# Patient Record
Sex: Female | Born: 1955 | Race: White | Hispanic: No | Marital: Married | Smoking: Former smoker
Health system: Southern US, Community
[De-identification: ages and names within clinical notes are randomized; demographics above are authoritative.]

## PROBLEM LIST (undated history)

## (undated) DIAGNOSIS — N840 Polyp of corpus uteri: Secondary | ICD-10-CM

## (undated) DIAGNOSIS — M1712 Unilateral primary osteoarthritis, left knee: Secondary | ICD-10-CM

## (undated) DIAGNOSIS — H409 Unspecified glaucoma: Secondary | ICD-10-CM

## (undated) DIAGNOSIS — T7840XA Allergy, unspecified, initial encounter: Secondary | ICD-10-CM

## (undated) DIAGNOSIS — E7800 Pure hypercholesterolemia, unspecified: Secondary | ICD-10-CM

## (undated) DIAGNOSIS — R011 Cardiac murmur, unspecified: Secondary | ICD-10-CM

## (undated) DIAGNOSIS — F411 Generalized anxiety disorder: Secondary | ICD-10-CM

## (undated) DIAGNOSIS — J189 Pneumonia, unspecified organism: Secondary | ICD-10-CM

## (undated) DIAGNOSIS — I10 Essential (primary) hypertension: Secondary | ICD-10-CM

## (undated) DIAGNOSIS — I499 Cardiac arrhythmia, unspecified: Secondary | ICD-10-CM

## (undated) DIAGNOSIS — F329 Major depressive disorder, single episode, unspecified: Secondary | ICD-10-CM

## (undated) DIAGNOSIS — E8940 Asymptomatic postprocedural ovarian failure: Secondary | ICD-10-CM

## (undated) DIAGNOSIS — N870 Mild cervical dysplasia: Secondary | ICD-10-CM

## (undated) DIAGNOSIS — M1990 Unspecified osteoarthritis, unspecified site: Secondary | ICD-10-CM

## (undated) DIAGNOSIS — E119 Type 2 diabetes mellitus without complications: Secondary | ICD-10-CM

## (undated) HISTORY — DX: Asymptomatic postprocedural ovarian failure: E89.40

## (undated) HISTORY — DX: Generalized anxiety disorder: F41.1

## (undated) HISTORY — DX: Unspecified glaucoma: H40.9

## (undated) HISTORY — DX: Allergy, unspecified, initial encounter: T78.40XA

## (undated) HISTORY — DX: Mild cervical dysplasia: N87.0

## (undated) HISTORY — DX: Essential (primary) hypertension: I10

## (undated) HISTORY — DX: Unspecified osteoarthritis, unspecified site: M19.90

## (undated) HISTORY — PX: DILATION AND CURETTAGE OF UTERUS: SHX78

## (undated) HISTORY — PX: TONSILLECTOMY: SUR1361

## (undated) HISTORY — DX: Pure hypercholesterolemia, unspecified: E78.00

## (undated) HISTORY — DX: Unilateral primary osteoarthritis, left knee: M17.12

## (undated) HISTORY — DX: Type 2 diabetes mellitus without complications: E11.9

## (undated) HISTORY — DX: Polyp of corpus uteri: N84.0

## (undated) HISTORY — DX: Major depressive disorder, single episode, unspecified: F32.9

---

## 1994-06-16 DIAGNOSIS — N870 Mild cervical dysplasia: Secondary | ICD-10-CM

## 1994-06-16 HISTORY — PX: CERVICAL CONE BIOPSY: SUR198

## 1994-06-16 HISTORY — DX: Mild cervical dysplasia: N87.0

## 1998-11-12 ENCOUNTER — Other Ambulatory Visit: Admission: RE | Admit: 1998-11-12 | Discharge: 1998-11-12 | Payer: Self-pay | Admitting: *Deleted

## 2000-05-19 ENCOUNTER — Other Ambulatory Visit: Admission: RE | Admit: 2000-05-19 | Discharge: 2000-05-19 | Payer: Self-pay | Admitting: *Deleted

## 2003-03-24 ENCOUNTER — Other Ambulatory Visit: Admission: RE | Admit: 2003-03-24 | Discharge: 2003-03-24 | Payer: Self-pay | Admitting: Obstetrics and Gynecology

## 2003-06-17 DIAGNOSIS — J189 Pneumonia, unspecified organism: Secondary | ICD-10-CM

## 2003-06-17 HISTORY — DX: Pneumonia, unspecified organism: J18.9

## 2003-07-10 ENCOUNTER — Inpatient Hospital Stay (HOSPITAL_COMMUNITY): Admission: EM | Admit: 2003-07-10 | Discharge: 2003-07-13 | Payer: Self-pay | Admitting: Emergency Medicine

## 2003-08-22 ENCOUNTER — Encounter: Admission: RE | Admit: 2003-08-22 | Discharge: 2003-08-22 | Payer: Self-pay | Admitting: Internal Medicine

## 2003-09-05 ENCOUNTER — Ambulatory Visit: Admission: RE | Admit: 2003-09-05 | Discharge: 2003-09-05 | Payer: Self-pay | Admitting: Internal Medicine

## 2004-07-24 ENCOUNTER — Other Ambulatory Visit: Admission: RE | Admit: 2004-07-24 | Discharge: 2004-07-24 | Payer: Self-pay | Admitting: Obstetrics and Gynecology

## 2005-02-06 ENCOUNTER — Ambulatory Visit: Payer: Self-pay | Admitting: Endocrinology

## 2005-04-10 ENCOUNTER — Ambulatory Visit: Payer: Self-pay | Admitting: Endocrinology

## 2005-04-15 ENCOUNTER — Ambulatory Visit: Payer: Self-pay | Admitting: Endocrinology

## 2005-08-22 ENCOUNTER — Other Ambulatory Visit: Admission: RE | Admit: 2005-08-22 | Discharge: 2005-08-22 | Payer: Self-pay | Admitting: Obstetrics and Gynecology

## 2005-12-29 ENCOUNTER — Encounter: Admission: RE | Admit: 2005-12-29 | Discharge: 2005-12-29 | Payer: Self-pay | Admitting: Orthopedic Surgery

## 2006-04-17 ENCOUNTER — Ambulatory Visit: Payer: Self-pay | Admitting: Endocrinology

## 2006-06-02 ENCOUNTER — Ambulatory Visit: Payer: Self-pay | Admitting: Internal Medicine

## 2006-06-16 HISTORY — PX: TOTAL HIP ARTHROPLASTY: SHX124

## 2006-07-22 ENCOUNTER — Ambulatory Visit: Payer: Self-pay | Admitting: Endocrinology

## 2006-07-22 LAB — CONVERTED CEMR LAB
AST: 17 units/L (ref 0–37)
Albumin: 3.8 g/dL (ref 3.5–5.2)
Basophils Absolute: 0.1 10*3/uL (ref 0.0–0.1)
Bilirubin Urine: NEGATIVE
Bilirubin, Direct: 0.1 mg/dL (ref 0.0–0.3)
Calcium: 9.1 mg/dL (ref 8.4–10.5)
Chloride: 105 meq/L (ref 96–112)
Cholesterol: 252 mg/dL (ref 0–200)
Eosinophils Absolute: 0.4 10*3/uL (ref 0.0–0.6)
GFR calc Af Amer: 137 mL/min
GFR calc non Af Amer: 113 mL/min
Glucose, Bld: 89 mg/dL (ref 70–99)
HDL: 34.3 mg/dL — ABNORMAL LOW (ref >39.0)
Hemoglobin, Urine: NEGATIVE
Hgb A1c MFr Bld: 5.7 % (ref 4.6–6.0)
Ketones, ur: NEGATIVE mg/dL
Leukocytes, UA: NEGATIVE
MCHC: 34.3 g/dL (ref 30.0–36.0)
MCV: 89.1 fL (ref 78.0–100.0)
Monocytes Relative: 6.2 % (ref 3.0–11.0)
Platelets: 239 10*3/uL (ref 150–400)
RBC: 4.86 M/uL (ref 3.87–5.11)
RDW: 13.3 % (ref 11.5–14.6)
Sodium: 141 meq/L (ref 135–145)
Specific Gravity, Urine: >=1.03 (ref 1.000–1.03)
TSH: 2.34 microintl units/mL (ref 0.35–5.50)
Total CHOL/HDL Ratio: 7.3
Triglycerides: 186 mg/dL — ABNORMAL HIGH (ref 0–149)
pH: 6 (ref 5.0–8.0)

## 2006-08-12 ENCOUNTER — Ambulatory Visit: Payer: Self-pay

## 2006-08-17 ENCOUNTER — Ambulatory Visit: Payer: Self-pay | Admitting: Endocrinology

## 2006-09-08 ENCOUNTER — Inpatient Hospital Stay (HOSPITAL_COMMUNITY): Admission: RE | Admit: 2006-09-08 | Discharge: 2006-09-12 | Payer: Self-pay | Admitting: Orthopedic Surgery

## 2006-09-08 ENCOUNTER — Ambulatory Visit: Payer: Self-pay | Admitting: Internal Medicine

## 2006-10-07 ENCOUNTER — Ambulatory Visit (HOSPITAL_COMMUNITY): Admission: RE | Admit: 2006-10-07 | Discharge: 2006-10-07 | Payer: Self-pay | Admitting: Ophthalmology

## 2006-10-26 ENCOUNTER — Ambulatory Visit: Payer: Self-pay | Admitting: Endocrinology

## 2006-11-17 ENCOUNTER — Ambulatory Visit: Payer: Self-pay | Admitting: Endocrinology

## 2006-11-17 LAB — CONVERTED CEMR LAB
ALT: 18 units/L (ref 0–40)
AST: 19 units/L (ref 0–37)
Alkaline Phosphatase: 59 units/L (ref 39–117)
Bilirubin, Direct: 0.1 mg/dL (ref 0.0–0.3)
Cholesterol: 170 mg/dL (ref 0–200)
HDL: 37.6 mg/dL — ABNORMAL LOW (ref >39.0)
Total Bilirubin: 0.7 mg/dL (ref 0.3–1.2)
Total Protein: 6.5 g/dL (ref 6.0–8.3)

## 2007-03-31 ENCOUNTER — Ambulatory Visit: Payer: Self-pay | Admitting: Endocrinology

## 2007-03-31 ENCOUNTER — Encounter: Payer: Self-pay | Admitting: Endocrinology

## 2007-08-11 ENCOUNTER — Ambulatory Visit: Payer: Self-pay | Admitting: Endocrinology

## 2007-08-23 ENCOUNTER — Telehealth (INDEPENDENT_AMBULATORY_CARE_PROVIDER_SITE_OTHER): Payer: Self-pay | Admitting: *Deleted

## 2007-12-20 ENCOUNTER — Telehealth (INDEPENDENT_AMBULATORY_CARE_PROVIDER_SITE_OTHER): Payer: Self-pay | Admitting: *Deleted

## 2008-04-03 ENCOUNTER — Ambulatory Visit: Payer: Self-pay | Admitting: Endocrinology

## 2008-04-03 DIAGNOSIS — M791 Myalgia, unspecified site: Secondary | ICD-10-CM | POA: Insufficient documentation

## 2008-04-03 DIAGNOSIS — E1169 Type 2 diabetes mellitus with other specified complication: Secondary | ICD-10-CM | POA: Insufficient documentation

## 2008-04-03 DIAGNOSIS — I10 Essential (primary) hypertension: Secondary | ICD-10-CM | POA: Insufficient documentation

## 2008-04-03 DIAGNOSIS — E7800 Pure hypercholesterolemia, unspecified: Secondary | ICD-10-CM | POA: Insufficient documentation

## 2008-04-03 DIAGNOSIS — I152 Hypertension secondary to endocrine disorders: Secondary | ICD-10-CM | POA: Insufficient documentation

## 2008-04-03 HISTORY — DX: Essential (primary) hypertension: I10

## 2008-04-03 HISTORY — DX: Pure hypercholesterolemia, unspecified: E78.00

## 2008-05-09 ENCOUNTER — Other Ambulatory Visit: Admission: RE | Admit: 2008-05-09 | Discharge: 2008-05-09 | Payer: Self-pay | Admitting: Obstetrics and Gynecology

## 2008-05-09 ENCOUNTER — Ambulatory Visit: Payer: Self-pay | Admitting: Obstetrics and Gynecology

## 2008-05-09 ENCOUNTER — Encounter: Payer: Self-pay | Admitting: Obstetrics and Gynecology

## 2008-10-19 ENCOUNTER — Ambulatory Visit: Payer: Self-pay | Admitting: Endocrinology

## 2008-10-19 DIAGNOSIS — F320 Major depressive disorder, single episode, mild: Secondary | ICD-10-CM | POA: Insufficient documentation

## 2008-10-19 DIAGNOSIS — F329 Major depressive disorder, single episode, unspecified: Secondary | ICD-10-CM

## 2008-10-19 HISTORY — DX: Major depressive disorder, single episode, unspecified: F32.9

## 2008-12-01 ENCOUNTER — Ambulatory Visit: Payer: Self-pay | Admitting: Internal Medicine

## 2008-12-01 ENCOUNTER — Ambulatory Visit: Payer: Self-pay | Admitting: Gastroenterology

## 2008-12-14 HISTORY — PX: COLONOSCOPY W/ POLYPECTOMY: SHX1380

## 2008-12-15 ENCOUNTER — Ambulatory Visit: Payer: Self-pay | Admitting: Gastroenterology

## 2008-12-15 ENCOUNTER — Encounter: Payer: Self-pay | Admitting: Gastroenterology

## 2008-12-19 ENCOUNTER — Encounter: Payer: Self-pay | Admitting: Gastroenterology

## 2009-03-28 ENCOUNTER — Ambulatory Visit: Admission: RE | Admit: 2009-03-28 | Discharge: 2009-03-28 | Payer: Self-pay | Admitting: Internal Medicine

## 2009-03-28 ENCOUNTER — Encounter: Payer: Self-pay | Admitting: Internal Medicine

## 2009-03-28 ENCOUNTER — Ambulatory Visit: Payer: Self-pay | Admitting: Vascular Surgery

## 2009-04-02 ENCOUNTER — Encounter: Payer: Self-pay | Admitting: Endocrinology

## 2009-04-03 ENCOUNTER — Ambulatory Visit: Payer: Self-pay | Admitting: Endocrinology

## 2009-04-16 ENCOUNTER — Encounter (HOSPITAL_BASED_OUTPATIENT_CLINIC_OR_DEPARTMENT_OTHER): Admission: RE | Admit: 2009-04-16 | Discharge: 2009-05-28 | Payer: Self-pay | Admitting: Internal Medicine

## 2009-07-17 LAB — HM MAMMOGRAPHY: HM Mammogram: NORMAL

## 2009-08-17 ENCOUNTER — Telehealth: Payer: Self-pay | Admitting: Endocrinology

## 2009-08-18 ENCOUNTER — Ambulatory Visit: Payer: Self-pay | Admitting: Family Medicine

## 2009-08-18 LAB — CONVERTED CEMR LAB
Blood in Urine, dipstick: NEGATIVE
Ketones, urine, test strip: NEGATIVE
Nitrite: NEGATIVE
Protein, U semiquant: NEGATIVE

## 2009-11-01 ENCOUNTER — Ambulatory Visit: Payer: Self-pay | Admitting: Obstetrics and Gynecology

## 2009-11-01 ENCOUNTER — Other Ambulatory Visit: Admission: RE | Admit: 2009-11-01 | Discharge: 2009-11-01 | Payer: Self-pay | Admitting: Obstetrics and Gynecology

## 2009-11-09 ENCOUNTER — Ambulatory Visit: Payer: Self-pay | Admitting: Obstetrics and Gynecology

## 2009-12-10 ENCOUNTER — Telehealth: Payer: Self-pay | Admitting: Endocrinology

## 2009-12-25 ENCOUNTER — Ambulatory Visit: Payer: Self-pay | Admitting: Endocrinology

## 2009-12-26 LAB — CONVERTED CEMR LAB
ALT: 15 units/L (ref 0–35)
Albumin: 4.3 g/dL (ref 3.5–5.2)
BUN: 11 mg/dL (ref 6–23)
Basophils Absolute: 0.1 10*3/uL (ref 0.0–0.1)
CO2: 32 meq/L (ref 19–32)
Chloride: 104 meq/L (ref 96–112)
Cholesterol: 232 mg/dL — ABNORMAL HIGH (ref 0–200)
Glucose, Bld: 93 mg/dL (ref 70–99)
HCT: 45.4 % (ref 36.0–46.0)
HDL: 36.7 mg/dL — ABNORMAL LOW (ref >39.00)
Hemoglobin: 15.3 g/dL — ABNORMAL HIGH (ref 12.0–15.0)
Ketones, ur: NEGATIVE mg/dL
Lymphs Abs: 3 10*3/uL (ref 0.7–4.0)
MCV: 89.7 fL (ref 78.0–100.0)
Monocytes Absolute: 0.5 10*3/uL (ref 0.1–1.0)
Neutro Abs: 7 10*3/uL (ref 1.4–7.7)
Platelets: 247 10*3/uL (ref 150.0–400.0)
Potassium: 4.5 meq/L (ref 3.5–5.1)
RDW: 14.5 % (ref 11.5–14.6)
Specific Gravity, Urine: 1.025 (ref 1.000–1.030)
TSH: 2.07 microintl units/mL (ref 0.35–5.50)
Total Bilirubin: 0.8 mg/dL (ref 0.3–1.2)
Total Protein, Urine: 30 mg/dL
Urine Glucose: NEGATIVE mg/dL
Urobilinogen, UA: 0.2 (ref 0.0–1.0)
VLDL: 40.4 mg/dL — ABNORMAL HIGH (ref 0.0–40.0)

## 2009-12-28 ENCOUNTER — Ambulatory Visit: Payer: Self-pay | Admitting: Endocrinology

## 2009-12-28 DIAGNOSIS — G4730 Sleep apnea, unspecified: Secondary | ICD-10-CM | POA: Insufficient documentation

## 2010-01-24 ENCOUNTER — Ambulatory Visit: Payer: Self-pay | Admitting: Professional

## 2010-05-02 ENCOUNTER — Encounter: Payer: Self-pay | Admitting: Endocrinology

## 2010-07-14 LAB — CONVERTED CEMR LAB
ALT: 16 units/L (ref 0–35)
Albumin: 4.4 g/dL (ref 3.5–5.2)
Alkaline Phosphatase: 54 units/L (ref 39–117)
Alkaline Phosphatase: 55 units/L (ref 39–117)
BUN: 12 mg/dL (ref 6–23)
Basophils Relative: 0.4 % (ref 0.0–1.0)
Basophils Relative: 5.4 % — ABNORMAL HIGH (ref 0.0–3.0)
Bilirubin Urine: NEGATIVE
CO2: 32 meq/L (ref 19–32)
CO2: 32 meq/L (ref 19–32)
Calcium: 9.8 mg/dL (ref 8.4–10.5)
Chloride: 103 meq/L (ref 96–112)
Creatinine, Ser: 0.7 mg/dL (ref 0.4–1.2)
Crystals: NEGATIVE
Eosinophils Absolute: 0.3 10*3/uL (ref 0.0–0.7)
Eosinophils Relative: 2.9 % (ref 0.0–5.0)
HCT: 45.8 % (ref 36.0–46.0)
Hemoglobin, Urine: NEGATIVE
Hemoglobin, Urine: NEGATIVE
Hemoglobin: 15.2 g/dL — ABNORMAL HIGH (ref 12.0–15.0)
Hemoglobin: 15.2 g/dL — ABNORMAL HIGH (ref 12.0–15.0)
Ketones, ur: NEGATIVE mg/dL
Lymphocytes Relative: 18.3 % (ref 12.0–46.0)
Lymphocytes Relative: 27.7 % (ref 12.0–46.0)
MCHC: 33.7 g/dL (ref 30.0–36.0)
MCV: 88.3 fL (ref 78.0–100.0)
MCV: 89.6 fL (ref 78.0–100.0)
Mucus, UA: NEGATIVE
Neutro Abs: 6.1 10*3/uL (ref 1.4–7.7)
Neutro Abs: 7.7 10*3/uL (ref 1.4–7.7)
Neutrophils Relative %: 72.2 % (ref 43.0–77.0)
Platelets: 226 10*3/uL (ref 150–400)
Potassium: 3.7 meq/L (ref 3.5–5.1)
RBC: 5.11 M/uL (ref 3.87–5.11)
Sodium: 142 meq/L (ref 135–145)
TSH: 2.8 microintl units/mL (ref 0.35–5.50)
Total Bilirubin: 1.2 mg/dL (ref 0.3–1.2)
Total Protein, Urine: NEGATIVE mg/dL
Total Protein: 6.7 g/dL (ref 6.0–8.3)
Total Protein: 7.3 g/dL (ref 6.0–8.3)
Urine Glucose: NEGATIVE mg/dL
Urine Glucose: NEGATIVE mg/dL
Urobilinogen, UA: 0.2 (ref 0.0–1.0)
Urobilinogen, UA: 0.2 (ref 0.0–1.0)
VLDL: 28 mg/dL (ref 0–40)
WBC: 10.7 10*3/uL — ABNORMAL HIGH (ref 4.5–10.5)

## 2010-07-17 LAB — HM MAMMOGRAPHY: HM Mammogram: NORMAL

## 2010-07-18 NOTE — Progress Notes (Signed)
Summary: Orange urine  Phone Note Call from Patient Call back at [phone] cell   Caller: Patient Call For: Dr Everardo All Summary of Call: Pt states every afternoon for 1 wk her urine is "bright" orange. No fever,no burning or pain. No symptoms. Pt thinks it may be blood but she is not certain. Pt is requesting urinalysis. Please advise. Initial call taken by: Verdell Face,  August 17, 2009 3:12 PM  Follow-up for Phone Call        Would you like to see patient in office or do you prefer labs prior? Please advise. Follow-up by: Lucious Groves,  August 17, 2009 3:44 PM  Additional Follow-up for Phone Call Additional follow up Details #1::        ov now or tomorrow Additional Follow-up by: Minus Breeding MD,  August 17, 2009 3:47 PM    Additional Follow-up for Phone Call Additional follow up Details #2::    Patient notified. Follow-up by: Lucious Groves,  August 17, 2009 4:03 PM

## 2010-07-18 NOTE — Progress Notes (Signed)
Summary: CPX due  Phone Note Outgoing Call Call back at [facility] Phone [phone]   Call placed by: Brenton Grills MA,  December 10, 2009 1:53 PM Action Taken: Appt scheduled Details for Reason: CPX due Summary of Call: [facility] Summary from [employer] for pt-pt is due for an CPX, last OV was 08/2009 called pt left message on VM to return call.  Follow-up for Phone Call        Appointment scheduled 7/15 2pm

## 2010-07-18 NOTE — Assessment & Plan Note (Signed)
Summary: PER PH NOTE/AJ--PHYSICAL--D/T--STC   Vital Signs:  Patient profile:   55 year old female Height:      64 inches (162.56 cm) Weight:      265 pounds (120.45 kg) BMI:     45.65 O2 Sat:      94 % on Room air Temp:     97.6 degrees F (36.44 degrees C) oral Pulse rate:   79 / minute Pulse rhythm:   regular BP sitting:   140 / 86  (left arm) Cuff size:   large  Vitals Entered By: Brenton Grills MA (December 28, 2009 2:16 PM)  O2 Flow:  Room air CC: CPX/aj   Primary Provider:  Minus Breeding MD  CC:  CPX/aj.  History of Present Illness: here for regular wellness examination.  she's feeling pretty well in general, and does not smoke.  alcohol is rare.   Current Medications (verified): 1)  Ambien 10 Mg  Tabs (Zolpidem Tartrate) .... Qhs 2)  Aspirin Adult Low Strength 81 Mg Tbec (Aspirin) .Marland Kitchen.. 1 Daily 3)  Citalopram Hydrobromide 40 Mg Tabs (Citalopram Hydrobromide) .Marland Kitchen.. 1 Qd 4)  Hyzaar 100-25 Mg Tabs (Losartan Potassium-Hctz) .Marland Kitchen.. 1 Qd  Allergies (verified): No Known Drug Allergies  Family History: Reviewed history from 08/11/2007 and no changes required. patient's mother died from pancreatic cancer  Social History: Reviewed history from 12/01/2008 and no changes required. patient is married. 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         depression is well-controlled.   Physical Exam  General:  obese.  no distress  Head:  head: no deformity eyes: no periorbital swelling, no proptosis external nose and ears are normal mouth: no lesion seen Neck:  Supple without thyroid enlargement or tenderness.   Breasts:  sees gyn  Heart:   Regular rate and rhythm without murmurs or gallops noted. Normal S1,S2.   Abdomen:  abdomen is soft, nontender.  no hepatosplenomegaly.   not distended.  no hernia  Rectal:  sees gyn  Genitalia:  sees gyn  Msk:  muscle bulk and strength are grossly normal.  no obvious joint swelling.  gait is normal and steady  Extremities:  no deformity.  no ulcer on the feet.  feet are of normal color and temp.  no edema  Neurologic:  cn 2-12 grossly intact.   readily moves all 4's.   sensation is intact to touch on the feet. Skin:  normal texture and temp.  no rash.  not diaphoretic  Cervical Nodes:  No significant adenopathy.  Additional Exam:  SEPARATE EVALUATION FOLLOWS--EACH PROBLEM HERE IS NEW, NOT RESPONDING TO TREATMENT, OR POSES SIGNIFICANT RISK TO THE PATIENT'S HEALTH: HISTORY OF THE PRESENT ILLNESS: the status of at least 3 ongoing medical problems is addressed today: pt says she has difficulty with concentration.   ? sleep apnea:  pt says her husband says she snores.  she says ins does not pay for bariatric surgery.   htn:  she takes hyzaar, and tolerates well. dyslipidemia:  she tolerated crestor well, but is not taking. PAST MEDICAL HISTORY:   reviewed and up to date today. REVIEW OF SYSTEMS: denies sob.  she attributes cough to allergy. PHYSICAL EXAMINATION: dorsalis pedis intact bilat.  no  carotid bruit clear to auscultation.  no respiratory distress. does not appear anxious nor depressed. LAB/XRAY RESULTS: Cholesterol LDL  186.6 mg/dL. IMPRESSION: difficulty with concentration, uncertain etiology ? sleep apnea. dyslipidemia, needs increased rx htn, with ? of situational component. PLAN: see instruction sheet   Impression & Recommendations:  Problem # 1:  ROUTINE GENERAL MEDICAL EXAM@HEALTH  CARE FACL (ICD-V70.0)  Medications Added to Medication List This Visit: 1)  Crestor 20 Mg Tabs (Rosuvastatin calcium) .Marland Kitchen.. 1 once daily  Other Orders: Sleep Disorder Referral  (Sleep Disorder) Psychology Referral (Psychology) Est. Patient Level IV ([address]) Est. Patient 40-64 years ([address])  Patient Instructions: 1)  resume crestor 20 mg once daily 2)  in 4-6 weeks, go to lab for lipids 272.4, and liver v58.69, and also do repeat blood pressure check that day. 3)  refer sleep specialist.  you will be called with a day and time for an appointment 4)  refer psychologist.  you will be called with a day and time for an appointment. 5)  please consider these measures for your health:  minimize alcohol.  do not use tobacco products.  have a colonoscopy at least every 10 years from age 64.  keep firearms safely stored.  always use seat belts.  have working smoke alarms in your home.  see the dentist regularly.  never drive under the influence of alcohol or drugs (including prescription drugs).  those with fair skin should take precautions against the sun. 6)  it is critically important to maintain a healthy body weight.  i would be happy to refer you to a dietician.   7)  return 1 year. Prescriptions: AMBIEN 10 MG  TABS (ZOLPIDEM TARTRATE) qhs  #30 x 5   Entered and Authorized by:   Minus Breeding MD   Signed by:   Minus Breeding MD on 12/28/2009   Method used:   Print then Give to Patient   RxID:   [id number] HYZAAR 100-25 MG TABS (LOSARTAN POTASSIUM-HCTZ) 1 qd  #30 x 11   Entered and Authorized by:   Minus Breeding MD   Signed by:   Minus Breeding MD on 12/28/2009   Method used:   Electronically to        [facility] [address] [phone]* (retail)       [address] [place], [place]  [address]       Ph: [phone]       Fax: [phone]   RxID:   [id number] CITALOPRAM HYDROBROMIDE 40 MG TABS (CITALOPRAM HYDROBROMIDE) 1 qd  #30 x 11   Entered and Authorized by:   Minus Breeding MD   Signed by:   Minus Breeding MD on 12/28/2009   Method used:   Electronically to        [facility] [address] [phone]* (retail)       [address]  [place], [place]  [address]       Ph: [phone]       Fax: [phone]   RxID:   [id number] CRESTOR 20 MG TABS (ROSUVASTATIN CALCIUM) 1 once daily  #30 x 11   Entered and Authorized by:   Minus Breeding MD   Signed by:   Minus Breeding MD on 12/28/2009   Method used:   Electronically to        [facility]. [phone]* (retail)       [address]  [place], [place]  [address]       Ph: [phone]       Fax: [phone]   RxID:   [id number]    Preventive Care Screening  Pap Smear:    Date:  10/14/2009    Results:  normal   Mammogram:    Date:  07/17/2009    Results:  normal      gyn is dr Dianne Dun

## 2010-07-18 NOTE — Assessment & Plan Note (Signed)
Summary: ORANGE URINE/ PER TRIAGE/NWS   Vital Signs:  Patient profile:   55 year old female Weight:      265 pounds Temp:     98.8 degrees F oral Pulse rate:   80 / minute Pulse rhythm:   regular BP sitting:   144 / 100  (left arm) Cuff size:   large  Vitals Entered By: Lowella Petties CMA (August 18, 2009 9:12 AM) CC: Pt has noticed orange urine x 3. Also, pain right ear.   Primary Provider:  Minus Breeding MD   History of Present Illness: 55 year old:  Urine was an orange coloration, then happened again on Wed. Yesterday, was brownish orange.  Then looks normal after that instance. Not associated with intercourse. No dysuria. No urgency. Is feeling well other than recent URI.  Has had an earache for two weeks.   ROS: recent congestion, URI signs, no fever, chills, sweats  GEN: WDWN, NAD; alert,appropriate and cooperative throughout exam HEENT: Normocephalic and atraumatic. Throat clear, w/o exudate, no LAD, R TM serous fluid, L TM - good landmarks, some fluid present. mild rhinnorhea.  Left frontal and maxillary sinuses: NT Right frontal and maxillary sinuses: NT NECK: No ant or post LAD CV: RRR, No M/G/R PULM: no resp distress, no accessory muscles.  No retractions. no w/c/r ABD: S,NT,ND,+BS, No HSM EXTR: no c/c/e PSYCH: full affect, pleasant, conversant   Allergies: No Known Drug Allergies   Impression & Recommendations:  Problem # 1:  OTHER ABNORMALITY OF URINATION (ICD-788.69)  Etiology unclear, no blood on UA, no dysuria, no urgency.   May all be dehydration and encouraged fluid intake  Obtain BMP - to assure normal kidney function  Orders: UA Dipstick w/o Micro (manual) (29562)  Complete Medication List: 1)  Ambien 10 Mg Tabs (Zolpidem tartrate) .... Qhs 2)  Aspirin Adult Low Strength 81 Mg Tbec (Aspirin) .Marland Kitchen.. 1 daily 3)  Citalopram Hydrobromide 40 Mg Tabs (Citalopram hydrobromide) .Marland Kitchen.. 1 qd 4)  Hyzaar 100-25 Mg Tabs (Losartan potassium-hctz) .Marland Kitchen.. 1  qd  Prior Medications: AMBIEN 10 MG  TABS (ZOLPIDEM TARTRATE) qhs ASPIRIN ADULT LOW STRENGTH 81 MG TBEC (ASPIRIN) 1 daily CITALOPRAM HYDROBROMIDE 40 MG TABS (CITALOPRAM HYDROBROMIDE) 1 qd HYZAAR 100-25 MG TABS (LOSARTAN POTASSIUM-HCTZ) 1 qd Current Allergies: No known allergies   Laboratory Results   Urine Tests  Date/Time Received: August 18, 2009 9:22 AM  Date/Time Reported: August 18, 2009 9:22 AM   Routine Urinalysis   Color: yellow Appearance: Clear Glucose: negative   (Normal Range: Negative) Bilirubin: negative   (Normal Range: Negative) Ketone: negative   (Normal Range: Negative) Spec. Gravity: <1.005   (Normal Range: 1.003-1.035) Blood: negative   (Normal Range: Negative) pH: 5.0   (Normal Range: 5.0-8.0) Protein: negative   (Normal Range: Negative) Urobilinogen: 0.2   (Normal Range: 0-1) Nitrite: negative   (Normal Range: Negative) Leukocyte Esterace: negative   (Normal Range: Negative)       Appended Document: ORANGE URINE/ PER TRIAGE/NWS Could not find BMP results in EMR and could not access Echart from home. I called lab, and they have no record of blood draw from yesterday, so I assume patient did not go as advised.

## 2010-09-19 ENCOUNTER — Encounter: Payer: Self-pay | Admitting: Endocrinology

## 2010-09-19 ENCOUNTER — Other Ambulatory Visit (INDEPENDENT_AMBULATORY_CARE_PROVIDER_SITE_OTHER): Payer: 59

## 2010-09-19 ENCOUNTER — Ambulatory Visit (INDEPENDENT_AMBULATORY_CARE_PROVIDER_SITE_OTHER): Payer: 59 | Admitting: Endocrinology

## 2010-09-19 DIAGNOSIS — IMO0001 Reserved for inherently not codable concepts without codable children: Secondary | ICD-10-CM

## 2010-09-19 DIAGNOSIS — M791 Myalgia, unspecified site: Secondary | ICD-10-CM

## 2010-09-19 LAB — SEDIMENTATION RATE: Sed Rate: 20 mm/h (ref 0–22)

## 2010-09-19 MED ORDER — ACETAMINOPHEN-CODEINE #3 300-30 MG PO TABS: 1.0000 | ORAL_TABLET | ORAL | Status: AC | PRN

## 2010-09-19 NOTE — Patient Instructions (Addendum)
blood tests are being ordered for you today.  please call [phone] to hear your test results. Let's also check a special test of the nerve endings and muscles of the arms and legs.  you will be called with a day and time for an appointment. Here is a prescription for pain medication.

## 2010-09-19 NOTE — Progress Notes (Signed)
  Subjective:    Patient ID: Katherine Lawrence, female    DOB: 07-30-1955, 55 y.o.   MRN: [id number]  HPI Pt states few weeks of severe pain of the muscles of both arms and legs (worse on the arms), but no assoc numbness.  She says she last took crestor last year.   Past Medical History  Diagnosis Date  . DM 08/11/2007  . DIVERTICULITIS, ACUTE 12/01/2008  . HYPERCHOLESTEROLEMIA 04/03/2008  . DEPRESSION 10/19/2008  . HYPERTENSION 04/03/2008  . MYALGIA 04/03/2008   No past surgical history on file.  reports that she has quit smoking. She does not have any smokeless tobacco history on file. She reports that she does not drink alcohol or use illicit drugs. family history includes Cancer in her mother. No Known Allergies Review of Systems Denies numbness, arthralgias, and cramps.      Objective:   Physical Exam GENERAL: no distress.  Morbidly obese Msk: the muscles of the arms are slightly tender Full rom of the joint of the ue' and le's.  Painless passively, but painful with active rom. Neuro: sensation is intact to touch on all 4's Pulses: radials and dorsalis pedis are intact bilat.   skin:  No rash.    Assessment & Plan:  Myalgias, severe.  New problem Dyslipidemia, therapy limited by noncompliance.  i'll do the best i can.

## 2010-09-27 ENCOUNTER — Ambulatory Visit (INDEPENDENT_AMBULATORY_CARE_PROVIDER_SITE_OTHER): Payer: 59 | Admitting: Obstetrics and Gynecology

## 2010-09-27 DIAGNOSIS — B373 Candidiasis of vulva and vagina: Secondary | ICD-10-CM

## 2010-09-27 DIAGNOSIS — N949 Unspecified condition associated with female genital organs and menstrual cycle: Secondary | ICD-10-CM

## 2010-09-27 DIAGNOSIS — R5381 Other malaise: Secondary | ICD-10-CM

## 2010-09-27 DIAGNOSIS — R5383 Other fatigue: Secondary | ICD-10-CM

## 2010-10-02 ENCOUNTER — Telehealth: Payer: Self-pay

## 2010-10-02 NOTE — Telephone Encounter (Signed)
Pt informed

## 2010-10-02 NOTE — Telephone Encounter (Signed)
Pt called requesting results of labs done 09/19/2010

## 2010-10-02 NOTE — Telephone Encounter (Signed)
Normal--good 

## 2010-10-28 ENCOUNTER — Other Ambulatory Visit: Payer: Self-pay | Admitting: Endocrinology

## 2010-10-28 ENCOUNTER — Other Ambulatory Visit: Payer: Self-pay

## 2010-10-28 DIAGNOSIS — Z0000 Encounter for general adult medical examination without abnormal findings: Secondary | ICD-10-CM

## 2010-10-29 NOTE — Discharge Summary (Signed)
Katherine Lawrence, Katherine Lawrence                 ACCOUNT NO.:  [id number]   MEDICAL RECORD NO.:  [id number]          PATIENT TYPE:  INP   LOCATION:  5008                         FACILITY:  [facility]   PHYSICIAN:  Thereasa Distance A. Mortenson, M.D.DATE OF BIRTH:  08-24-1955   DATE OF ADMISSION:  09/08/2006  DATE OF DISCHARGE:  09/12/2006                               DISCHARGE SUMMARY   ADMISSION DIAGNOSES:  1. End-stage osteoarthritis right hip.  2. Obesity.  3. Hypercholesterolemia.   DISCHARGE DIAGNOSES:  1. 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  2. Acute blood loss anemia secondary to surgery.  3. Hypoxia now resolved.  4. Constipation.  5. Obesity.  6. Type 2 diabetes mellitus.  7. Hypokalemia now resolved.  8. Hypercholesterolemia.   SURGICAL PROCEDURES:  1. On September 08, 2006, Katherine Lawrence underwent a right total hip      arthroplasty by Dr. Rinaldo Ratel assisted by Dr. Claude Manges.      [place] and [employer].  She had a Pinnacle 100 series      acetabular cup placed, size 52 mm with an apex hole eliminator and      Pinnacle marathon acetabular liner lipped 32 mm inner diameter, 52      mm outer diameter.  An AML 6 inches small stature femoral stem size      10.5 mm with 32 mm femoral head with +1 neck length 12-14 cone.   COMPLICATIONS:  None.   CONSULTANT:  1. Physical therapy consult case management consult September 09, 2006.  2. Occupational therapy consult and [facility] consult      September 10, 2006.   HISTORY OF PRESENT ILLNESS:  This 55 year old white female patient  presented to Dr. Chaney Malling with a history of right hip pain and groin  pain for many years.  Pain is now interfering with her normal activities  and difficulty sleeping.  It is moderate to severe aches with occasional  sharpness.  She has failed conservative treatment and x-rays show end-  stage arthritic changes.  Because of that, she is presenting for right  hip replacement.   HOSPITAL COURSE:  Katherine Lawrence tolerated her surgical procedure well without  immediate postoperative complications.  She was transferred to 5000.  Postop day #1, T max was 99.6, vitals were stable.  Hemoglobin 11.8,  hematocrit 34.8.  Leg was neurovascularly intact.  She was weaned off  her oxygen and started on physical therapy.   Postop day #2, it was noted she was having difficulty keeping her  saturations.  On room air, she was 84-88%, placed back on oxygen and was  able to keep her saturations between 93-98%.  Hemoglobin was still  stable at 11.1, hematocrit 32.1, temperature was elevated at 100.6,  vitals were otherwise stable.  Leg was neurovascularly intact with  incision well approximated with staples.  Chest x-ray and ABG were  obtained to rule out pneumonia.  She was continued on therapy and  switched to p.o. pain medications.  X-ray, at that time, showed  pulmonary edema  and bilateral effusions consistent with fluid overload  and lower lobe atelectasis, possibly due to infection or atelectasis, so  internal medicine consult was obtained by Dr. Felicity Coyer, and she followed  her throughout the hospitalization.  They felt it was due to fluid  overload and adjusted her medications appropriately.   On postop day #3 she was feeling a bit better.  Hemoglobin 10.5,  hematocrit 31.2, potassium dropped to 3.2 at that time.  O2 was  attempted to be weaned at that time.  She was having some difficulty  with constipation and that was treated with medications.  Potassium was  supplemented.   On postop day #4, she was doing better.  The chest x-ray at that time  showed pulmonary edema had improved.  She was feeling better, T max  100.2, vitals were stable.  She was doing well with therapy and pain was  well-controlled with medications.  Because of that, she was able to be  DC home later that day.   MEDICATIONS:  She may resume her prehospitalization medications with   these substitutions.  She is not to be on Vicodin while she is on other  pain medications.  She is not to be on Aleve at this time, and she is  not to be on aspirin at this time.  Her other home medications at this  time include:  1. Crestor 40 mg p.o. q.a.m..  2. Citalopram 40 mg p.o. q.a.m.   Additional medications at this time include:  1. Aspirin 81 mg to resume 2 tablets each a.m. once the Arixtra is      completed.  2. Arixtra 2.5 mg subcu q.8 a.m. with the last dose to be September 18, 2006.  3. Percocet 5/325 mg 1-2 tablets p.o. q.4 h p.r.n. for pain 60 with no      refill.  4. Robaxin 500 mg 1-2 tablets p.o. q.6 h p.r.n. for spasms 40 with no      refill.  5. Avelox 400 mg 1 tablet p.o. q.a.m. for six more days.  6. Magnesium citrate, take half bottle p.o. when you get home and call      if no bowel movement.   DISCHARGE INSTRUCTIONS:  1. Diet:  She can resume her regular prehospitalization diet.  2. Activity: She can be out of bed, partial weightbearing 50% or less      on right leg with use of walker.  She is to have home health PT per      [facility].  Please see the blue total hip discharge      sheet for further activity instructions.  3. Wound care:  She is to keep the wound clean and dry and can change      the dressing daily.  She can shower after no drainage from the      wound for 2 days.  Please see the blue total hip discharge sheet      for further wound care instructions.  4. Follow-up:  She needs to follow up with Dr. Chaney Malling in our office      on Wednesday, September 23, 2006.  Needs to call [phone] for that      appointment.  She needs to follow up with Dr. Everardo All in his office      in about 2-3 weeks and needs to call him for that appointment.   LABORATORY DATA:  X-ray taken of the right hip on March 25  showed good  appearance of the hip without complicating features.  Chest x-ray done on March 27 showed mild to moderate congestive heart  failure a new  bilateral lower lobe atelectasis.  CT angio of the chest at that time,  March 27, showed no evidence for acute PE, but pulmonary edema and  bilateral effusions are likely due to congestive heart failure pulmonary  edema and there is air space disease within both lungs predominantly  affecting the lower lobes, likely due to infection and/or aspiration.  Repeat chest right x-ray done on March 28 showed small bilateral pleural  effusions and bibasilar atelectasis with pulmonary edema being better  than on previous CT.   ABG done on room air on March 27 showed a pH 7.442, pCO2 of 42.1 and pO2  of 50.8, bicarb of 28.3, base excess of 4.3.   Hemoglobin/hematocrit ranged from 16.2 and 48.2 on the 19th to 10.5 at  31.2 on the 28th.  White count went from 11.2 on the 19th to 10.2 on the  28th.  Platelets remained within normal limits.   Potassium dropped to a low of 3.2 on the 28th.  Glucose was 92 on the  19th and went to a high of 132 on the 26th.  BUN dropped to a low of 3  with creatinine of 0.52 on the 28th.  B-natruretic peptide on March 27,  initially at 5:00 a.m. was 126, at 5:10 p.m. was 185.  Urinalysis on  both the 19th and the 20th showed 13 mg/dL of protein.  All other  indices within normal limits.      Legrand Pitts Duffy, P.A.    ______________________________  Lenard Galloway Chaney Malling, M.D.    KED/MEDQ  D:  10/16/2006  T:  10/16/2006  Job:  [id number]   cc:   Gregary Signs A. Everardo All, MD

## 2010-11-01 NOTE — Op Note (Signed)
Katherine Lawrence, Katherine Lawrence                 ACCOUNT NO.:  [id number]   MEDICAL RECORD NO.:  [id number]          PATIENT TYPE:  INP   LOCATION:  2899                         FACILITY:  [facility]   PHYSICIAN:  Thereasa Distance A. Mortenson, M.D.DATE OF BIRTH:  Feb 24, 1956   DATE OF PROCEDURE:  09/08/2006  DATE OF DISCHARGE:                               OPERATIVE REPORT   PREOPERATIVE DIAGNOSIS:  End-stage osteoarthritis right hip.   POSTOPERATIVE DIAGNOSIS:  End-stage osteoarthritis right hip.   OPERATION:  Total hip replacement on the right using a DePuy 100 series  Pinnacle acetabular cup 52 mm outside diameter with an apex hole  eliminator and a 52 mm outside diameter Pinnacle Marathon acetabular  liner with 32 mm inside diameter, with a 32 mm femoral head +1 neck and  an AML 6 inch small stature 10.5 mm diameter femoral component, all  press-fitted.   SURGEON:  Lenard Galloway. Chaney Malling, M.D.   ASSISTANTS:  1. Claude Manges. Cleophas Dunker, M.D.  2. Chestine Spore.   ANESTHESIA:  General.   PROCEDURE:  The patient placed on the operative table in the supine  position.  After satisfactory general anesthesia, the patient was placed  in he full lateral position, the right hip up.  Right lower extremity  and hip prepped with DuraPrep and draped out in the usual manner.  Vi-  drape was placed over the operative site.  Posterior __________ incision  was made.  Self-retaining retractor put in place.  Bleeders were  coagulated.  Tensor fascia lata was split and access to the posterior  aspect of the hip was achieved.  The piriformis identified nicely,  tagged and released and brought to the midline to sweep the sciatic  nerve out of the operative field.  Great care was taken to avoid any  injury to the sciatic nerve.  The posterior hip capsule was then incised  at the base of the neck and a vertical incision made up to the labrum.  The hip was then dislocated.  Using a power saw, the neck was then  amputated.  Once this was  accomplished, the proximal end of the femur  could be easily accessed.  A canal fundus passed down the femoral canal  and a 9 mm and then 10 mm fully fluted reamer was then passed down the  femoral canal.  There was significant bowing in the femoral canal and it  was felt that a 10.5 prosthesis was the appropriate size.  The broach  then passed down the femoral canal, this was small stature 10.5 and  seated nicely in the calcar.  Calcar rim was then used to ream off any  incongruity.  A nice press fit was achieved.  The broach was then  removed.  Attention turned to the acetabulum.   Curved retractors were placed around the periphery of the acetabulum.  The labrum was excised with a knife and rongeur.  A series of cheese  cutter reamers were then used and this was reamed out to a 51 mm outside  diameter.  The acetabulum was deepened and expanded and there was good  bleeding bone when this was accomplished.  The hip was irrigated with  copious amounts of saline solution.  A 50 mm trial was placed in the  acetabulum and it seated very nicely.  There is good coverage 360  degrees.  The final Porocoated 52 mm Pinnacle cut was then press fitted  into the prepared acetabular area.  There was a great scratch fit and  this was extremely stable.  The trial poly was then inserted with 52 mm  inside diameter.  Front broach was passed down the femoral canal.  The  femoral neck with a +1 neck, 32 mm ball trial was put in position.  The  hip relocated.  Hip was put through a through full range of motion.  Hip  was extremely stable in all position.  Leg lengths were essentially  equal.  This was a small shelf of bone anteriorly and this was removed  with an osteotome.  Again, there was wonderful stability of the hip in  all positions and this was felt to be the perfect size and length.  All  the trial components were then removed.  Saline was used to irrigate the  proximal femur and the acetabulum once  again.  The apex hole eliminator  was placed in the acetabulum.  The final femoral component was then  driven down the femoral canal and seated nicely on the calcar.  The  final hip ball was then placed over the tendon and tap fit in place.  The poly had been inserted prior to this step and snap fitted in place.  Hip was then relocated and put through a full range of motion. The hip  was extremely stable in all positions.  I was very pleased with final  outcome.  The posterior capsule was then closed almost anatomically.  Excellent capsule closure achieved with heavy Ethibond sutures.  The  piriformis was then reattached with heavy Ethibond.  Again the hip was  irrigated with saline solution.  Tensor fascia lata was closed with  heavy Ethibond and then Vicryl was used to close subcutaneous tissue and  stainless steel staples were used to close the skin.  Sterile dressings  were applied and the patient returned to the recovery room in excellent  condition.  Technically, this went extremely well.   DRAINS:  None.   COMPLICATIONS:  None.           ______________________________  Lenard Galloway. Chaney Malling, M.D.     RAM/MEDQ  D:  09/08/2006  T:  09/08/2006  Job:  [id number]

## 2010-11-01 NOTE — H&P (Signed)
Katherine Lawrence, KOGLER                             ACCOUNT NO.:  [id number]   MEDICAL RECORD NO.:  [id number]                   PATIENT TYPE:  INP   LOCATION:  4703                                 FACILITY:  [facility]   PHYSICIAN:  Sean A. Everardo All, M.D. [facility]           DATE OF BIRTH:  [birth year]   DATE OF ADMISSION:  07/10/2003  DATE OF DISCHARGE:                                HISTORY & PHYSICAL   REASON FOR ADMISSION:  Pneumonia.   HISTORY OF PRESENT ILLNESS:  The patient is a 55 year old woman with three  days of a productive quality cough which has associated chest pain.  She  also has myalgias of the legs and arms.  She also states symptoms of fever,  chills, nausea, vomiting, and wheezing.   PAST MEDICAL HISTORY:  1. Diverticulosis.  2. Hypertension.  3. Dyslipidemia.  4. Type 2 diabetes.  5. Arthralgias of uncertain etiology.  6. Her last menstrual period is now.   MEDICATIONS:  (None of which the patient has taken recently)  1. Cardizem CD 360 mg daily.  2. Aspirin 81 mg daily.  3. Actos 15 mg a day.  4. Celebrex 100 mg a day.  5. Lipitor 40 mg a day.  6. Diovan 320 mg a day.   SOCIAL HISTORY:  The patient quit smoking many years ago.  She is married.  She works for [employer].   FAMILY HISTORY:  The patient's son has a cough.   REVIEW OF SYSTEMS:  She has a slight sore throat and earache, and she has  lost 5 pounds since she has been ill.  However, she denies the following.  Syncope, urinary incontinence, dysuria, rectal bleeding, hematuria,  abdominal pain, and skin rash.   PHYSICAL EXAMINATION:  VITAL SIGNS:  Blood pressure 150/80, heart rate 100,  temperature 101.9, weight 262.  GENERAL:  Obese, does not appear acutely ill.  SKIN:  Not diaphoretic.  HEENT:  Head is atraumatic.  Sclerae nonicteric.  Pharynx is clear.  Both  tympanic membranes are erythematous.  NECK:  Supple, no goiter.  CHEST:  No respiratory distress, but there are diffuse expiratory  wheezes  and rales.  CARDIOVASCULAR:  No JVD, no edema.  Regular rate and rhythm, no murmur.  ABDOMEN:  Soft, obese, nontender.  No hepatosplenomegaly, no mass.  MUSCULOSKELETAL:  Gait is observed in the office to be normal.  NEUROLOGIC:  Alert, well oriented, moves all fours, and sensation intact to  touch on the feet.   LABORATORY DATA:  Oxygen saturation 93%.  Random glucose 117.   Chest x-ray shows bilateral lower lobe infiltrates.   IMPRESSION:  1. Bilobar pneumonia.  2. Asthma exacerbated by #1.  3. Chronic medical problems as note above for which the patient has not been     taking her medications, nor has she recently followed up.   PLAN:  1. Admit to [facility].  2. Blood cultures.  3. Antibiotics.  4. Supplemental oxygen.  5. Bronchodilators.  6. For her blood pressure, I will start just one of the two agents on the     chance that, with her acute illness, she may not need both medications.  7. I discussed the case with pulmonary who recommends hospitalization     because of the significant risk of a rapid clinical deterioration given     the extensive involvement with her pneumonia.                                                Sean A. Everardo All, M.D. [facility]    SAE/MEDQ  D:  07/10/2003  T:  07/11/2003  Job:  [id number]

## 2010-11-01 NOTE — Discharge Summary (Signed)
Katherine Lawrence, Katherine Lawrence                             ACCOUNT NO.:  [id number]   MEDICAL RECORD NO.:  [id number]                   PATIENT TYPE:  INP   LOCATION:  4734                                 FACILITY:  [facility]   PHYSICIAN:  Sean A. Everardo All, M.D. [facility]           DATE OF BIRTH:  1955-06-19   DATE OF ADMISSION:  07/10/2003  DATE OF DISCHARGE:  07/13/2003                                 DISCHARGE SUMMARY   DISCHARGE DIAGNOSES:  1. Productive cough.  2. Myalgias.  3. Fever, chills.  4. Bilateral lower lobe pneumonia.  5. Respiratory insufficiency.  6. Acute exacerbation of asthma.   BRIEF ADMISSION HISTORY:  Ms. Hibberd is a 55 year old white female who  presented with a 3-day history of productive cough.  She described  associated myalgias in her legs and arms.  She has also described fever,  chills, nausea, vomiting, and wheezing.  Chest x-ray in the office revealed  a bilateral lobe infiltrate.   HOSPITAL COURSE:  Problem 1:  ID.  Patient presented with febrile illness.  The patient defervesced.  White count was normal.  Blood cultures were  obtained on July 10, 2003, and July 11, 2003, and are negative so far.  Admission chest x-ray was consistent with a bilateral lower lobe pneumonia.  Follow up chest x-ray on July 12, 2003, confirmed the same diagnosis.  The patient received 3 days of IV antibiotics and then was changed to oral  antibiotics.   Problem 2:  Pulmonary.  The patient presented with respiratory insufficiency  and hypoxemia.  This was felt to be secondary to her bilateral lower lobe  pneumonia as well as an acute exacerbation of asthma.  The patient was  treated with oxygen, nebulizers, and antibiotics.  As her course was  refractory, we added steroids with good results.  Currently she is  maintaining sats at 92% or greater on room air.  She is also now on oral  steroids and antibiotics.  Will attempt to arrange for home nebulizers, and  if that is not a  possibility we will use metered dose inhalers.   Problem 3:  Adult onset diabetes mellitus.  The patient states she was on  Actos in the past.  Her blood sugars have been mildly elevated in the mid  140s.  This may be in part secondary to infection versus steroids.  Her  hemoglobin-A1c was high normal at 6.1%.  Patient's Actos has been resumed  during this admission.   LABS AT DISCHARGE:  Blood cultures are negative.  Hemoglobin-A1c was 6.1%.  LFTs were normal.  TSH was 1.890.   MEDICATIONS AT DISCHARGE:  1. Actos 15 mg daily.  2. Diovan 320 mg daily.  3. Lipitor 40 mg daily.  4. Guaifenesin 600 mg two tabs b.i.d. for 6 days.  5. Ceftin 250 mg b.i.d. for 6 days.  6. Zithromax 250 mg one last dose on July 14, 2003.  7. Prednisone 20 mg three tabs for 3 days, two tabs for 3 days, and one tab     for 3 days and stop.  8. Tussionex 5 mL q. 12 hours p.r.n.  9. Albuterol nebulized t.i.d. for 6 days then q. 4 to 6 hours p.r.n.   FOLLOW UP:  With Dr. Everardo All in 2 to 3 weeks.      Cornell Barman, P.A. [facility]                  Sean A. Everardo All, M.D. [facility]    LC/MEDQ  D:  07/13/2003  T:  07/14/2003  Job:  [id number]   cc:   Gregary Signs A. Everardo All, M.D. [facility]

## 2010-11-01 NOTE — Op Note (Signed)
Katherine Lawrence, Katherine Lawrence                 ACCOUNT NO.:  [id number]   MEDICAL RECORD NO.:  [id number]          PATIENT TYPE:  AMB   LOCATION:  SDS                          FACILITY:  [facility]   PHYSICIAN:  Robert L. Dione Booze, M.D.  DATE OF BIRTH:  [birth year]   DATE OF PROCEDURE:  10/07/2006  DATE OF DISCHARGE:  10/07/2006                               OPERATIVE REPORT   PREOPERATIVE DIAGNOSIS:  Severe dermatochalasis with visual impairment.   POSTOPERATIVE DIAGNOSIS:  Severe dermatochalasis with visual impairment.   OPERATION PERFORMED:  Upper eyelid blepharoplasty.   SURGEON:  Robert L. Dione Booze, M.D.   ANESTHESIA:  Xylocaine 1% with epinephrine.   INDICATIONS AND JUSTIFICATION FOR ELECTIVE PROCEDURE:  This lady has  been followed in my office for a number of years with worsening  dermatochalasis.  She was see on July 02, 2006, and again, July 08, 2006, regarding this.  The droopy eyelids cause her to loose some  vision, and she has multiple symptoms from this.  Prior approval was  obtained from [facility] to perform upper eyelid  blepharoplasties, and visual field reduction was confirmed with  performance of visual fields with the skin taped, compared to when it  was not taped, and with photographs.  Pressures are 23 in each eye.  The  vision is 20/30.  Confrontation fields show reduction at the upper  field.  Pupils and motility, conjunctiva, cornea, anterior chamber and  fundic exam were unremarkable, and there was no cataract.  She does have  redundant skin of each upper eyelid, with the skin actually covering the  eyelashes and coming close to each pupil.  This was discussed and the  patient decided to have upper eyelid blepharoplasties.  Medically, she  should be stable.   JUSTIFICATION FOR PERFORMING PROCEDURE IN OUTPATIENT SETTING:  Routine  justification; need for overnight stay - none.   PROCEDURE:  The patient arrived in the minor surgery room and was  prepped and  draped in the routine fashion.  Xylocaine 1% with  epinephrine was given to each upper eyelid, and the skin to be removed  was carefully demarcated and excised.  Bleeding was controlled with  cautery and pressure.  Cold compresses were applied, as was some  Polysporin ointment.  The patient left the minor room, having done  nicely.   FOLLOWUP CARE:  Patient to be seen in my office in 5 days to have the  sutures removed.  She is to use warm compresses after the first day.           ______________________________  [name]. Dione Booze, M.D.     RLG/MEDQ  D:  10/08/2006  T:  10/08/2006  Job:  [id number]

## 2010-11-04 ENCOUNTER — Encounter: Payer: Self-pay | Admitting: Endocrinology

## 2010-11-14 ENCOUNTER — Ambulatory Visit (INDEPENDENT_AMBULATORY_CARE_PROVIDER_SITE_OTHER): Payer: 59 | Admitting: Endocrinology

## 2010-11-14 ENCOUNTER — Other Ambulatory Visit (INDEPENDENT_AMBULATORY_CARE_PROVIDER_SITE_OTHER): Payer: 59

## 2010-11-14 ENCOUNTER — Encounter: Payer: Self-pay | Admitting: Endocrinology

## 2010-11-14 ENCOUNTER — Other Ambulatory Visit: Payer: Self-pay | Admitting: Endocrinology

## 2010-11-14 VITALS — BP 124/76 | HR 85 | Temp 98.0°F | Ht 64.0 in | Wt 260.8 lb

## 2010-11-14 DIAGNOSIS — Z0000 Encounter for general adult medical examination without abnormal findings: Secondary | ICD-10-CM

## 2010-11-14 DIAGNOSIS — E119 Type 2 diabetes mellitus without complications: Secondary | ICD-10-CM

## 2010-11-14 LAB — URINALYSIS
Hgb urine dipstick: NEGATIVE
Leukocytes, UA: NEGATIVE
Nitrite: NEGATIVE
Urobilinogen, UA: 0.2 (ref 0.0–1.0)

## 2010-11-14 LAB — HEPATIC FUNCTION PANEL: Total Bilirubin: 0.8 mg/dL (ref 0.3–1.2)

## 2010-11-14 LAB — CBC WITH DIFFERENTIAL/PLATELET
Eosinophils Relative: 2 % (ref 0.0–5.0)
HCT: 43.5 % (ref 36.0–46.0)
Hemoglobin: 14.5 g/dL (ref 12.0–15.0)
Lymphs Abs: 2.9 10*3/uL (ref 0.7–4.0)
Monocytes Relative: 4.9 % (ref 3.0–12.0)
Neutro Abs: 7.4 10*3/uL (ref 1.4–7.7)
Platelets: 237 10*3/uL (ref 150.0–400.0)
WBC: 11.1 10*3/uL — ABNORMAL HIGH (ref 4.5–10.5)

## 2010-11-14 LAB — LIPID PANEL
LDL Cholesterol: 96 mg/dL (ref 0–99)
VLDL: 25 mg/dL (ref 0.0–40.0)

## 2010-11-14 LAB — BASIC METABOLIC PANEL
BUN: 14 mg/dL (ref 6–23)
Chloride: 100 mEq/L (ref 96–112)
Glucose, Bld: 103 mg/dL — ABNORMAL HIGH (ref 70–99)
Potassium: 4.7 mEq/L (ref 3.5–5.1)

## 2010-11-14 LAB — HEMOGLOBIN A1C: Hgb A1c MFr Bld: 7 % — ABNORMAL HIGH (ref 4.6–6.5)

## 2010-11-14 NOTE — Progress Notes (Signed)
Subjective:    Patient ID: Katherine Lawrence, female    DOB: [birth year], 55 y.o.   MRN: [id number]  HPI here for regular wellness examination.  He's feeling pretty well in general, and says chronic med probs are stable, except as noted below.  Gyn is dr Dianne Dun.  She thinks she had pneumovax in approx 2005.   Past Medical History  Diagnosis Date  . DM 08/11/2007  . DIVERTICULITIS, ACUTE 12/01/2008  . HYPERCHOLESTEROLEMIA 04/03/2008  . DEPRESSION 10/19/2008  . HYPERTENSION 04/03/2008  . MYALGIA 04/03/2008    No past surgical history on file.  History   Social History  . Marital Status: Married    Spouse Name: N/A    Number of Children: N/A  . Years of Education: N/A   Occupational History  .  [employer]    works from home   Social History Main Topics  . Smoking status: Former Games developer  . Smokeless tobacco: Not on file  . Alcohol Use: No  . Drug Use: No  . Sexually Active:    Other Topics Concern  . Not on file   Social History Narrative   Pt is also a Consulting civil engineer.Regular exercise-yes    Current Outpatient Prescriptions on File Prior to Visit  Medication Sig Dispense Refill  . citalopram (CELEXA) 40 MG tablet Take 40 mg by mouth daily.        Marland Kitchen losartan-hydrochlorothiazide (HYZAAR) 100-25 MG per tablet Take 1 tablet by mouth daily.        . rosuvastatin (CRESTOR) 20 MG tablet Take 20 mg by mouth daily.        Marland Kitchen aspirin 81 MG tablet Take 81 mg by mouth daily.        Marland Kitchen DISCONTD: zolpidem (AMBIEN) 10 MG tablet Take 10 mg by mouth at bedtime as needed.          No Known Allergies  Family History  Problem Relation Age of Onset  . Cancer Mother     Pancreatic  Cancer    BP 124/76  Pulse 85  Temp(Src) 98 F (36.7 C) (Oral)  Ht 5\' 4"  (1.626 m)  Wt 260 lb 12.8 oz (118.298 kg)  BMI 44.77 kg/m2  SpO2 95%     Review of Systems  Constitutional: Negative for fever and unexpected weight change.  HENT: Negative for hearing loss.   Eyes:       Pt sees opthal for  mild blurry vision  Respiratory: Negative for shortness of breath.   Cardiovascular: Negative for chest pain.  Gastrointestinal: Negative for abdominal pain and anal bleeding.  Genitourinary: Negative for hematuria.  Musculoskeletal:       No change in chronic arthralgias.  Skin: Negative for rash.  Neurological: Negative for syncope.  Hematological: Does not bruise/bleed easily.  Psychiatric/Behavioral: Positive for dysphoric mood. The patient is not nervous/anxious.        Objective:   Physical Exam VS: see vs page GEN: no distress.  Obese. HEAD: head: no deformity eyes: no periorbital swelling, no proptosis external nose and ears are normal mouth: no lesion seen NECK: supple, thyroid is not enlarged CHEST WALL: no deformity BREASTS:  sees gyn CV: reg rate and rhythm, no murmur ABD: abdomen is soft, nontender.  no hepatosplenomegaly.  not distended.  no hernia GENITALIA:  sees gyn RECTAL: sees gyn MUSCULOSKELETAL: muscle bulk and strength are grossly normal.  no obvious joint swelling.  gait is normal and steady EXTEMITIES: no deformity.  no ulcer on the feet.  feet are of normal color and temp.  no edema.  There is a healed ulcer (2010 injury) at the right lat malleolus. PULSES: dorsalis pedis intact bilat.  no carotid bruit NEURO:  cn 2-12 grossly intact.   readily moves all 4's.  sensation is intact to touch on the feet SKIN:  Normal texture and temperature.  No rash or suspicious lesion is visible.   NODES:  None palpable at the neck PSYCH: alert, oriented x3.  Does not appear anxious nor depressed.         Assessment & Plan:  Wellness visit today, with problems stable, except as noted.

## 2010-11-14 NOTE — Patient Instructions (Signed)
please consider these measures for your health:  minimize alcohol.  do not use tobacco products.  have a colonoscopy at least every 10 years from age 55.  keep firearms safely stored.  always use seat belts.  have working smoke alarms in your home.  see an eye doctor and dentist regularly.  never drive under the influence of alcohol or drugs (including prescription drugs).  those with fair skin should take precautions against the sun. good diet and exercise habits significanly improve the control of your diabetes.  please let me know if you wish to be referred to a dietician.  high blood sugar is very risky to your health.  you should see an eye doctor every year. controlling your blood pressure and cholesterol drastically reduces the damage diabetes does to your body.  this also applies to quitting smoking.  please discuss these with your doctor.  you should take an aspirin every day, unless you have been advised by a doctor not to. Please return in 1 year

## 2010-11-15 LAB — TSH: TSH: 2.92 u[IU]/mL (ref 0.35–5.50)

## 2011-01-21 ENCOUNTER — Other Ambulatory Visit: Payer: Self-pay | Admitting: Endocrinology

## 2011-01-26 ENCOUNTER — Other Ambulatory Visit: Payer: Self-pay | Admitting: Endocrinology

## 2011-03-14 ENCOUNTER — Telehealth: Payer: Self-pay

## 2011-03-14 NOTE — Telephone Encounter (Signed)
Pharmacy sent a fax to advised of flu vaccination

## 2011-06-19 ENCOUNTER — Encounter: Payer: Self-pay | Admitting: Obstetrics and Gynecology

## 2011-06-24 ENCOUNTER — Encounter: Payer: Self-pay | Admitting: Endocrinology

## 2011-07-14 ENCOUNTER — Ambulatory Visit (INDEPENDENT_AMBULATORY_CARE_PROVIDER_SITE_OTHER): Payer: 59 | Admitting: Gynecology

## 2011-07-14 ENCOUNTER — Other Ambulatory Visit: Payer: Self-pay | Admitting: Gynecology

## 2011-07-14 ENCOUNTER — Encounter: Payer: Self-pay | Admitting: Gynecology

## 2011-07-14 VITALS — BP 138/90

## 2011-07-14 DIAGNOSIS — N938 Other specified abnormal uterine and vaginal bleeding: Secondary | ICD-10-CM

## 2011-07-14 DIAGNOSIS — N924 Excessive bleeding in the premenopausal period: Secondary | ICD-10-CM

## 2011-07-14 DIAGNOSIS — N921 Excessive and frequent menstruation with irregular cycle: Secondary | ICD-10-CM

## 2011-07-14 DIAGNOSIS — N949 Unspecified condition associated with female genital organs and menstrual cycle: Secondary | ICD-10-CM

## 2011-07-14 DIAGNOSIS — N898 Other specified noninflammatory disorders of vagina: Secondary | ICD-10-CM

## 2011-07-14 DIAGNOSIS — Z30432 Encounter for removal of intrauterine contraceptive device: Secondary | ICD-10-CM

## 2011-07-14 DIAGNOSIS — Z975 Presence of (intrauterine) contraceptive device: Secondary | ICD-10-CM

## 2011-07-14 LAB — CBC WITH DIFFERENTIAL/PLATELET
Hemoglobin: 15.8 g/dL — ABNORMAL HIGH (ref 12.0–15.0)
Lymphocytes Relative: 32 % (ref 12–46)
Lymphs Abs: 3 10*3/uL (ref 0.7–4.0)
Neutrophils Relative %: 60 % (ref 43–77)
Platelets: 232 10*3/uL (ref 150–400)
RBC: 5.41 MIL/uL — ABNORMAL HIGH (ref 3.87–5.11)
WBC: 9.4 10*3/uL (ref 4.0–10.5)

## 2011-07-14 LAB — WET PREP FOR TRICH, YEAST, CLUE

## 2011-07-14 LAB — FOLLICLE STIMULATING HORMONE: FSH: 16.6 m[IU]/mL

## 2011-07-14 MED ORDER — CLINDAMYCIN PHOSPHATE 2 % VA CREA: 1.0000 | TOPICAL_CREAM | Freq: Every day | VAGINAL | Status: AC

## 2011-07-14 NOTE — Patient Instructions (Addendum)
Will see you next week for sonohysterogram.  Bacterial Vaginosis Bacterial vaginosis (BV) is a vaginal infection where the normal balance of bacteria in the vagina is disrupted. The normal balance is then replaced by an overgrowth of certain bacteria. There are several different kinds of bacteria that can cause BV. BV is the most common vaginal infection in women of childbearing age. CAUSES   The cause of BV is not fully understood. BV develops when there is an increase or imbalance of harmful bacteria.   Some activities or behaviors can upset the normal balance of bacteria in the vagina and put women at increased risk including:   Having a new sex partner or multiple sex partners.   Douching.   Using an intrauterine device (IUD) for contraception.   It is not clear what role sexual activity plays in the development of BV. However, women that have never had sexual intercourse are rarely infected with BV.  Women do not get BV from toilet seats, bedding, swimming pools or from touching objects around them.  SYMPTOMS   Grey vaginal discharge.   A fish-like odor with discharge, especially after sexual intercourse.   Itching or burning of the vagina and vulva.   Burning or pain with urination.   Some women have no signs or symptoms at all.  DIAGNOSIS  Your caregiver must examine the vagina for signs of BV. Your caregiver will perform lab tests and look at the sample of vaginal fluid through a microscope. They will look for bacteria and abnormal cells (clue cells), a pH test higher than 4.5, and a positive amine test all associated with BV.  RISKS AND COMPLICATIONS   Pelvic inflammatory disease (PID).   Infections following gynecology surgery.   Developing HIV.   Developing herpes virus.  TREATMENT  Sometimes BV will clear up without treatment. However, all women with symptoms of BV should be treated to avoid complications, especially if gynecology surgery is planned. Female partners  generally do not need to be treated. However, BV may spread between female sex partners so treatment is helpful in preventing a recurrence of BV.   BV may be treated with antibiotics. The antibiotics come in either pill or vaginal cream forms. Either can be used with nonpregnant or pregnant women, but the recommended dosages differ. These antibiotics are not harmful to the baby.   BV can recur after treatment. If this happens, a second round of antibiotics will often be prescribed.   Treatment is important for pregnant women. If not treated, BV can cause a premature delivery, especially for a pregnant woman who had a premature birth in the past. All pregnant women who have symptoms of BV should be checked and treated.   For chronic reoccurrence of BV, treatment with a type of prescribed gel vaginally twice a week is helpful.  HOME CARE INSTRUCTIONS   Finish all medication as directed by your caregiver.   Do not have sex until treatment is completed.   Tell your sexual partner that you have a vaginal infection. They should see their caregiver and be treated if they have problems, such as a mild rash or itching.   Practice safe sex. Use condoms. Only have 1 sex partner.  PREVENTION  Basic prevention steps can help reduce the risk of upsetting the natural balance of bacteria in the vagina and developing BV:  Do not have sexual intercourse (be abstinent).   Do not douche.   Use all of the medicine prescribed for treatment of BV,  even if the signs and symptoms go away.   Tell your sex partner if you have BV. That way, they can be treated, if needed, to prevent reoccurrence.  SEEK MEDICAL CARE IF:   Your symptoms are not improving after 3 days of treatment.   You have increased discharge, pain, or fever.  MAKE SURE YOU:   Understand these instructions.   Will watch your condition.   Will get help right away if you are not doing well or get worse.  FOR MORE INFORMATION  Division of  STD Prevention (DSTDP), Centers for Disease Control and Prevention: [employer] American Social Health Association (ASHA): www.ashastd.org  Document Released: 06/02/2005 Document Revised: 02/12/2011 Document Reviewed: 11/23/2008 [facility] Patient Information 2012 [place], [place].

## 2011-07-14 NOTE — Progress Notes (Signed)
Patient is a 56 year old who presented to the office today with 2 complaints. Purse complaining of vaginal discharge and second complaint feel like her IUD is getting rate and fall out and pelvic pressure. Patient requesting have her IUD removed. The Mirena IUD was placed in may of 2011 she was treated before for yeast infection after similar complaints. Patient not sexually active. Her last FSH was in may of 2011 had a value of 4. Patient declines any vasomotor symptoms.  Exam: Abdomen: Soft nontender no rebound or guarding Pelvic: Bartholin urethra Skene was within normal limits Vagina: Brownish-like discharge Cervix: IUD string not seen. Uterus upper limits of normal anteverted Adnexa: Difficult to evaluate due to patient's abdominal girth  Wet prep: No evidence of clue cells or yeast noted few bacteria.  The cervix had been cleansed with Betadine solution and with the use of a [place] clamp the IUD string was grasped and the IUD was retrieved shown to the patient discarded. A Pipelle was introduced into the intrauterine cavity and moderate amount of tissue was obtained and submitted for histological evaluation.  Patient stop by the lab to check an [facility] today. She'll be placed on Cleocin vaginal cream to apply each bedtime for one week. She will return back next week for sonohysterogram and discuss results.Marland Kitchen

## 2011-07-15 ENCOUNTER — Other Ambulatory Visit: Payer: Self-pay | Admitting: *Deleted

## 2011-07-15 DIAGNOSIS — D582 Other hemoglobinopathies: Secondary | ICD-10-CM

## 2011-07-21 ENCOUNTER — Other Ambulatory Visit: Payer: Self-pay | Admitting: Gynecology

## 2011-07-21 ENCOUNTER — Ambulatory Visit (INDEPENDENT_AMBULATORY_CARE_PROVIDER_SITE_OTHER): Payer: 59 | Admitting: Gynecology

## 2011-07-21 ENCOUNTER — Ambulatory Visit (INDEPENDENT_AMBULATORY_CARE_PROVIDER_SITE_OTHER): Payer: 59

## 2011-07-21 DIAGNOSIS — N938 Other specified abnormal uterine and vaginal bleeding: Secondary | ICD-10-CM

## 2011-07-21 DIAGNOSIS — E65 Localized adiposity: Secondary | ICD-10-CM

## 2011-07-21 DIAGNOSIS — N83339 Acquired atrophy of ovary and fallopian tube, unspecified side: Secondary | ICD-10-CM

## 2011-07-21 DIAGNOSIS — N840 Polyp of corpus uteri: Secondary | ICD-10-CM

## 2011-07-21 DIAGNOSIS — R1900 Intra-abdominal and pelvic swelling, mass and lump, unspecified site: Secondary | ICD-10-CM

## 2011-07-21 DIAGNOSIS — L909 Atrophic disorder of skin, unspecified: Secondary | ICD-10-CM

## 2011-07-21 DIAGNOSIS — D3910 Neoplasm of uncertain behavior of unspecified ovary: Secondary | ICD-10-CM

## 2011-07-21 DIAGNOSIS — D259 Leiomyoma of uterus, unspecified: Secondary | ICD-10-CM

## 2011-07-21 DIAGNOSIS — N924 Excessive bleeding in the premenopausal period: Secondary | ICD-10-CM

## 2011-07-21 DIAGNOSIS — D251 Intramural leiomyoma of uterus: Secondary | ICD-10-CM

## 2011-07-21 DIAGNOSIS — N831 Corpus luteum cyst of ovary, unspecified side: Secondary | ICD-10-CM

## 2011-07-21 DIAGNOSIS — R1904 Left lower quadrant abdominal swelling, mass and lump: Secondary | ICD-10-CM

## 2011-07-21 DIAGNOSIS — N926 Irregular menstruation, unspecified: Secondary | ICD-10-CM

## 2011-07-21 DIAGNOSIS — N83209 Unspecified ovarian cyst, unspecified side: Secondary | ICD-10-CM

## 2011-07-21 HISTORY — DX: Polyp of corpus uteri: N84.0

## 2011-07-21 LAB — CBC WITH DIFFERENTIAL/PLATELET
Basophils Absolute: 0 10*3/uL (ref 0.0–0.1)
Basophils Relative: 0 % (ref 0–1)
Eosinophils Absolute: 0.3 10*3/uL (ref 0.0–0.7)
MCH: 29.4 pg (ref 26.0–34.0)
MCHC: 32.3 g/dL (ref 30.0–36.0)
Monocytes Relative: 5 % (ref 3–12)
Neutrophils Relative %: 71 % (ref 43–77)
Platelets: 206 10*3/uL (ref 150–400)
RDW: 14.2 % (ref 11.5–15.5)

## 2011-07-21 LAB — COMPREHENSIVE METABOLIC PANEL
AST: 14 U/L (ref 0–37)
Alkaline Phosphatase: 57 U/L (ref 39–117)
Glucose, Bld: 99 mg/dL (ref 70–99)
Sodium: 140 mEq/L (ref 135–145)
Total Bilirubin: 0.8 mg/dL (ref 0.3–1.2)
Total Protein: 6.7 g/dL (ref 6.0–8.3)

## 2011-07-21 NOTE — Patient Instructions (Signed)
Ovarian Cyst The ovaries are small organs that are on each side of the uterus. The ovaries are the organs that produce the female hormones, estrogen and progesterone. An ovarian cyst is a sac filled with fluid that can vary in its size. It is normal for a small cyst to form in women who are in the childbearing age and who have menstrual periods. This type of cyst is called a follicle cyst that becomes an ovulation cyst (corpus luteum cyst) after it produces the women's egg. It later goes away on its own if the woman does not become pregnant. There are other kinds of ovarian cysts that may cause problems and may need to be treated. The most serious problem is a cyst with cancer. It should be noted that menopausal women who have an ovarian cyst are at a higher risk of it being a cancer cyst. They should be evaluated very quickly, thoroughly and followed closely. This is especially true in menopausal women because of the high rate of ovarian cancer in women in menopause. CAUSES AND TYPES OF OVARIAN CYSTS:  FUNCTIONAL CYST: The follicle/corpus luteum cyst is a functional cyst that occurs every month during ovulation with the menstrual cycle. They go away with the next menstrual cycle if the woman does not get pregnant. Usually, there are no symptoms with a functional cyst.   ENDOMETRIOMA CYST: This cyst develops from the lining of the uterus tissue. This cyst gets in or on the ovary. It grows every month from the bleeding during the menstrual period. It is also called a "chocolate cyst" because it becomes filled with blood that turns brown. This cyst can cause pain in the lower abdomen during intercourse and with your menstrual period.   CYSTADENOMA CYST: This cyst develops from the cells on the outside of the ovary. They usually are not cancerous. They can get very big and cause lower abdomen pain and pain with intercourse. This type of cyst can twist on itself, cut off its blood supply and cause severe pain.  It also can easily rupture and cause a lot of pain.   DERMOID CYST: This type of cyst is sometimes found in both ovaries. They are found to have different kinds of body tissue in the cyst. The tissue includes skin, teeth, hair, and/or cartilage. They usually do not have symptoms unless they get very big. Dermoid cysts are rarely cancerous.   POLYCYSTIC OVARY: This is a rare condition with hormone problems that produces many small cysts on both ovaries. The cysts are follicle-like cysts that never produce an egg and become a corpus luteum. It can cause an increase in body weight, infertility, acne, increase in body and facial hair and lack of menstrual periods or rare menstrual periods. Many women with this problem develop type 2 diabetes. The exact cause of this problem is unknown. A polycystic ovary is rarely cancerous.   THECA LUTEIN CYST: Occurs when too much hormone (human chorionic gonadotropin) is produced and over-stimulates the ovaries to produce an egg. They are frequently seen when doctors stimulate the ovaries for invitro-fertilization (test tube babies).   LUTEOMA CYST: This cyst is seen during pregnancy. Rarely it can cause an obstruction to the birth canal during labor and delivery. They usually go away after delivery.  SYMPTOMS   Pelvic pain or pressure.   Pain during sexual intercourse.   Increasing girth (swelling) of the abdomen.   Abnormal menstrual periods.   Increasing pain with menstrual periods.   You stop having   menstrual periods and you are not pregnant.  DIAGNOSIS  The diagnosis can be made during:  Routine or annual pelvic examination (common).   Ultrasound.   X-ray of the pelvis.   CT Scan.   MRI.   Blood tests.  TREATMENT   Treatment may only be to follow the cyst monthly for 2 to 3 months with your caregiver. Many go away on their own, especially functional cysts.   May be aspirated (drained) with a long needle with ultrasound, or by laparoscopy  (inserting a tube into the pelvis through a small incision).   The whole cyst can be removed by laparoscopy.   Sometimes the cyst may need to be removed through an incision in the lower abdomen.   Hormone treatment is sometimes used to help dissolve certain cysts.   Birth control pills are sometimes used to help dissolve certain cysts.  HOME CARE INSTRUCTIONS  Follow your caregiver's advice regarding:  Medicine.   Follow up visits to evaluate and treat the cyst.   You may need to come back or make an appointment with another caregiver, to find the exact cause of your cyst, if your caregiver is not a gynecologist.   Get your yearly and recommended pelvic examinations and Pap tests.   Let your caregiver know if you have had an ovarian cyst in the past.  SEEK MEDICAL CARE IF:   Your periods are late, irregular, they stop, or are painful.   Your stomach (abdomen) or pelvic pain does not go away.   Your stomach becomes larger or swollen.   You have pressure on your bladder or trouble emptying your bladder completely.   You have painful sexual intercourse.   You have feelings of fullness, pressure, or discomfort in your stomach.   You lose weight for no apparent reason.   You feel generally ill.   You become constipated.   You lose your appetite.   You develop acne.   You have an increase in body and facial hair.   You are gaining weight, without changing your exercise and eating habits.   You think you are pregnant.  SEEK IMMEDIATE MEDICAL CARE IF:   You have increasing abdominal pain.   You feel sick to your stomach (nausea) and/or vomit.   You develop a fever that comes on suddenly.   You develop abdominal pain during a bowel movement.   Your menstrual periods become heavier than usual.  Document Released: 06/02/2005 Document Revised: 02/12/2011 Document Reviewed: 04/05/2009 Exit[employer] Patient Information 2012 [employer]. 

## 2011-07-21 NOTE — Progress Notes (Signed)
Patient is a 56 year old who was seen the office on January 28 1 to have her IUD removed because she felt pressure and felt like it was getting ready to fall out and had irregular bleeding. 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 - THERE IS NO EVIDENCE OF HYPERPLASIA OR MALIGNANCY  She was asked to return to the office today for sonohysterogram for further assessment. The ultrasound today demonstrated the following:  The uterus measured 8.3 x 6.1 x 4.9 cm with an endometrial stripe of 7 mm. She had one small intramural myoma measuring 22 x 19 mm. Prominent echogenic endometrium and a sonohysterogram demonstrated an anterior wall defect measured 10 x 8 x 9 mm suspicious for endometrial polyp. A right ovary had a thick wall cystic mass with layering of debris measuring 29 x 20 x 20 mm negative color flow. Positive arterial blood flow to the right and left ovary. Left ovary echo-free cyst measuring 19 x 18 x 15 mm, a second cystic mass measuring 39 x 28 x 39 mm with solid papillary nodule wall of the mass measuring 13 x 40 mm negative color flow.  The above findings were discussed with the patient in reference to this incidental finding of the suspicious left adnexal mass which warrants further evaluation and referral to GYN oncologist. Maryclare Labrador obtain a Ca125 today along with a CBC and comprehensive metabolic panel. We'll also schedule for an abdominal pelvic CT scan with and without contrast. I will contact Dr. Hazle Coca in reference to this patient for referral. Literature information on ovarian cyst was provided to the patient today Patient's last Pap smear May of 2011 reported to be normal. Mammogram December 2012 normal. All questions were answered we'll follow accordingly.

## 2011-07-22 ENCOUNTER — Other Ambulatory Visit: Payer: Self-pay | Admitting: *Deleted

## 2011-07-22 DIAGNOSIS — R1900 Intra-abdominal and pelvic swelling, mass and lump, unspecified site: Secondary | ICD-10-CM

## 2011-07-22 LAB — CA 125: CA 125: 25 U/mL (ref 0.0–30.2)

## 2011-07-23 ENCOUNTER — Ambulatory Visit (INDEPENDENT_AMBULATORY_CARE_PROVIDER_SITE_OTHER): Payer: 59 | Admitting: Family Medicine

## 2011-07-23 ENCOUNTER — Telehealth: Payer: Self-pay | Admitting: *Deleted

## 2011-07-23 ENCOUNTER — Encounter: Payer: Self-pay | Admitting: Family Medicine

## 2011-07-23 ENCOUNTER — Other Ambulatory Visit: Payer: Self-pay | Admitting: *Deleted

## 2011-07-23 VITALS — BP 136/84 | HR 81 | Temp 98.5°F | Resp 16 | Ht 63.0 in | Wt 262.0 lb

## 2011-07-23 DIAGNOSIS — J069 Acute upper respiratory infection, unspecified: Secondary | ICD-10-CM

## 2011-07-23 DIAGNOSIS — R1900 Intra-abdominal and pelvic swelling, mass and lump, unspecified site: Secondary | ICD-10-CM

## 2011-07-23 MED ORDER — HYDROCODONE-HOMATROPINE 5-1.5 MG/5ML PO SYRP: 5.0000 mL | ORAL_SOLUTION | Freq: Three times a day (TID) | ORAL | Status: AC | PRN

## 2011-07-23 MED ORDER — ALBUTEROL SULFATE HFA 108 (90 BASE) MCG/ACT IN AERS: 2.0000 | INHALATION_SPRAY | Freq: Four times a day (QID) | RESPIRATORY_TRACT | Status: DC | PRN

## 2011-07-23 NOTE — Telephone Encounter (Signed)
Per insurance we had to change to MRI.  Ok per [employer].  Appt set up at [facility] 07/28/11 @3pm  and appt set up with Dr. Tresa Endo on 07/30/11 @ 9:30am.

## 2011-07-23 NOTE — Progress Notes (Signed)
  Subjective:    Patient ID: Katherine Lawrence, female    DOB: 02-Oct-1955, 56 y.o.   MRN: [id number]  HPI 56 yo female with URI symptoms for 4 days.  Cough, diarrhea, headache, watery eyes, ear pressure.  No sore throat.  Positive runny nose.  No fever.  Using mucinex, nyquip, dayquil, sudafed.  Cough keeps her up at night.  Feels crackly in chest. Nonsmoker.  History of mild asthma   Review of Systems    Negative except as per HPI  Objective:   Physical Exam  Constitutional: She appears well-developed. No distress.  HENT:  Right Ear: Tympanic membrane, external ear and ear canal normal. Tympanic membrane is not injected, not scarred, not perforated, not erythematous, not retracted and not bulging.  Left Ear: Tympanic membrane, external ear and ear canal normal. Tympanic membrane is not injected, not scarred, not perforated, not erythematous, not retracted and not bulging.  Nose: No mucosal edema or rhinorrhea. Right sinus exhibits no maxillary sinus tenderness and no frontal sinus tenderness. Left sinus exhibits no maxillary sinus tenderness and no frontal sinus tenderness.  Mouth/Throat: Uvula is midline, oropharynx is clear and moist and mucous membranes are normal. No oropharyngeal exudate or tonsillar abscesses.  Cardiovascular: Normal rate, regular rhythm, normal heart sounds and intact distal pulses.   No murmur heard. Pulmonary/Chest: Effort normal and breath sounds normal. No respiratory distress. She has no wheezes. She has no rales.  Lymphadenopathy:       Head (right side): No submandibular and no preauricular adenopathy present.       Head (left side): No submandibular and no preauricular adenopathy present.       Right cervical: No superficial cervical and no posterior cervical adenopathy present.      Left cervical: No superficial cervical and no posterior cervical adenopathy present.       Right: No supraclavicular adenopathy present.       Left: No supraclavicular adenopathy  present.  Skin: Skin is warm and dry.          Assessment & Plan:  URI - viral.  Supportive tx.  Ventolin and hycodan.

## 2011-07-23 NOTE — Telephone Encounter (Signed)
Message copied by Libby Maw on Wed Jul 23, 2011  2:30 PM ------      Message from: Ok Edwards      Created: Mon Jul 21, 2011 11:32 AM       Kaydense Rizo, I have put in orders on this patient for chest xray and CT of Abdomen and pelvis w/ and w/o contrast for pelvic mass at [facility] need to speak with Dr. Hazle Coca about this patient I want to refer. Thank you.

## 2011-07-25 ENCOUNTER — Other Ambulatory Visit (HOSPITAL_COMMUNITY): Payer: 59

## 2011-07-28 ENCOUNTER — Ambulatory Visit (HOSPITAL_COMMUNITY)
Admission: RE | Admit: 2011-07-28 | Discharge: 2011-07-28 | Disposition: A | Discharge status: Home / Self Care | Payer: 59 | Source: Ambulatory Visit | Attending: Gynecology | Admitting: Gynecology

## 2011-07-28 DIAGNOSIS — N839 Noninflammatory disorder of ovary, fallopian tube and broad ligament, unspecified: Secondary | ICD-10-CM | POA: Insufficient documentation

## 2011-07-28 DIAGNOSIS — R1900 Intra-abdominal and pelvic swelling, mass and lump, unspecified site: Secondary | ICD-10-CM

## 2011-07-28 DIAGNOSIS — N83209 Unspecified ovarian cyst, unspecified side: Secondary | ICD-10-CM | POA: Insufficient documentation

## 2011-07-28 DIAGNOSIS — Z96649 Presence of unspecified artificial hip joint: Secondary | ICD-10-CM | POA: Insufficient documentation

## 2011-07-28 MED ORDER — GADOBENATE DIMEGLUMINE 529 MG/ML IV SOLN
20.0000 mL | Freq: Once | INTRAVENOUS | Status: AC | PRN
  Administered 2011-07-28: 20 mL via INTRAVENOUS

## 2011-07-30 DIAGNOSIS — E669 Obesity, unspecified: Secondary | ICD-10-CM

## 2011-07-30 HISTORY — DX: Obesity, unspecified: E66.9

## 2011-08-21 DIAGNOSIS — D4959 Neoplasm of unspecified behavior of other genitourinary organ: Secondary | ICD-10-CM

## 2011-08-21 HISTORY — DX: Neoplasm of unspecified behavior of other genitourinary organ: D49.59

## 2011-08-22 HISTORY — PX: ABDOMINAL HYSTERECTOMY: SHX81

## 2011-10-31 ENCOUNTER — Other Ambulatory Visit: Payer: Self-pay | Admitting: Physician Assistant

## 2011-11-19 ENCOUNTER — Telehealth: Payer: Self-pay | Admitting: *Deleted

## 2011-11-19 DIAGNOSIS — Z0000 Encounter for general adult medical examination without abnormal findings: Secondary | ICD-10-CM

## 2011-11-19 DIAGNOSIS — E119 Type 2 diabetes mellitus without complications: Secondary | ICD-10-CM

## 2011-11-19 NOTE — Telephone Encounter (Signed)
Message copied by Deatra James on Wed Nov 19, 2011  4:26 PM ------      Message from: Etheleen Sia      Created: Wed Nov 19, 2011  1:54 PM      Regarding: LABS       PHYSICAL LABS IN [place]

## 2011-11-19 NOTE — Telephone Encounter (Signed)
Received staff msg made cpx for July need labs entered... 11/19/11@4 :26pm/LMB

## 2011-11-30 ENCOUNTER — Ambulatory Visit (INDEPENDENT_AMBULATORY_CARE_PROVIDER_SITE_OTHER): Payer: 59 | Admitting: Emergency Medicine

## 2011-11-30 VITALS — BP 155/88 | HR 68 | Temp 98.8°F | Resp 18 | Ht 64.0 in | Wt 250.6 lb

## 2011-11-30 DIAGNOSIS — H6990 Unspecified Eustachian tube disorder, unspecified ear: Secondary | ICD-10-CM

## 2011-11-30 DIAGNOSIS — H6980 Other specified disorders of Eustachian tube, unspecified ear: Secondary | ICD-10-CM

## 2011-11-30 DIAGNOSIS — I10 Essential (primary) hypertension: Secondary | ICD-10-CM

## 2011-11-30 DIAGNOSIS — H6590 Unspecified nonsuppurative otitis media, unspecified ear: Secondary | ICD-10-CM

## 2011-11-30 MED ORDER — AMOXICILLIN 875 MG PO TABS: 875.0000 mg | ORAL_TABLET | Freq: Two times a day (BID) | ORAL | Status: AC

## 2011-11-30 MED ORDER — FLUTICASONE PROPIONATE 50 MCG/ACT NA SUSP: 2.0000 | Freq: Every day | NASAL | Status: DC

## 2011-11-30 NOTE — Progress Notes (Signed)
  Subjective:    Patient ID: Katherine Lawrence, female    DOB: Oct 16, 1955, 56 y.o.   MRN: [id number]  HPI [place] the beach [place]. For dinner right ear. The fluid sensation in her right ear. She denies nasal congestion.    Review of Systems     Objective:   Physical Exam the left TM is normal. M. show some fluid present in the middle ear. There is no movement of the tympanic membrane throat is normal chest is clear        Assessment & Plan:  Patient appears to have eustachian tube dysfunction with developing serous otitis media we'll treat with Flonase and amoxicillin. Her blood pressure was elevated this visit and she will followup [facility] regarding this

## 2011-11-30 NOTE — Patient Instructions (Signed)
Please continue your blood pressure medication. Please stop your use of Sudafed. Please followup with Dr. Everardo All if you see your blood pressure continues to stay elevated.

## 2011-12-16 ENCOUNTER — Other Ambulatory Visit (INDEPENDENT_AMBULATORY_CARE_PROVIDER_SITE_OTHER): Payer: 59

## 2011-12-16 DIAGNOSIS — Z0000 Encounter for general adult medical examination without abnormal findings: Secondary | ICD-10-CM

## 2011-12-16 DIAGNOSIS — E119 Type 2 diabetes mellitus without complications: Secondary | ICD-10-CM

## 2011-12-16 LAB — URINALYSIS, ROUTINE W REFLEX MICROSCOPIC
Hgb urine dipstick: NEGATIVE
Nitrite: NEGATIVE
Urobilinogen, UA: 0.2 (ref 0.0–1.0)

## 2011-12-16 LAB — LIPID PANEL
Cholesterol: 234 mg/dL — ABNORMAL HIGH (ref 0–200)
Total CHOL/HDL Ratio: 6
Triglycerides: 135 mg/dL (ref 0.0–149.0)

## 2011-12-16 LAB — CBC WITH DIFFERENTIAL/PLATELET
Basophils Absolute: 0 10*3/uL (ref 0.0–0.1)
HCT: 44.5 % (ref 36.0–46.0)
Lymphs Abs: 2.7 10*3/uL (ref 0.7–4.0)
Monocytes Absolute: 0.4 10*3/uL (ref 0.1–1.0)
Monocytes Relative: 4.1 % (ref 3.0–12.0)
Platelets: 231 10*3/uL (ref 150.0–400.0)
RDW: 14 % (ref 11.5–14.6)

## 2011-12-16 LAB — BASIC METABOLIC PANEL
Calcium: 9.7 mg/dL (ref 8.4–10.5)
Chloride: 101 mEq/L (ref 96–112)
Creatinine, Ser: 0.5 mg/dL (ref 0.4–1.2)

## 2011-12-16 LAB — HEPATIC FUNCTION PANEL
AST: 14 U/L (ref 0–37)
Albumin: 4.6 g/dL (ref 3.5–5.2)
Alkaline Phosphatase: 57 U/L (ref 39–117)
Total Protein: 7.4 g/dL (ref 6.0–8.3)

## 2011-12-16 LAB — TSH: TSH: 3.02 u[IU]/mL (ref 0.35–5.50)

## 2011-12-23 ENCOUNTER — Encounter: Payer: Self-pay | Admitting: Endocrinology

## 2011-12-23 ENCOUNTER — Ambulatory Visit (INDEPENDENT_AMBULATORY_CARE_PROVIDER_SITE_OTHER): Payer: 59 | Admitting: Endocrinology

## 2011-12-23 VITALS — BP 128/92 | HR 66 | Temp 100.1°F | Ht 64.0 in | Wt 250.0 lb

## 2011-12-23 DIAGNOSIS — Z0000 Encounter for general adult medical examination without abnormal findings: Secondary | ICD-10-CM

## 2011-12-23 DIAGNOSIS — E119 Type 2 diabetes mellitus without complications: Secondary | ICD-10-CM

## 2011-12-23 MED ORDER — ROSUVASTATIN CALCIUM 20 MG PO TABS: 20.0000 mg | ORAL_TABLET | Freq: Every day | ORAL | Status: DC

## 2011-12-23 NOTE — Progress Notes (Signed)
Subjective:    Patient ID: Katherine Lawrence, female    DOB: 1956/03/07, 56 y.o.   MRN: [id number]  HPI here for regular wellness examination.  she's feeling pretty well in general, and says chronic med probs are stable, except as noted below.  Pt says she is having difficulty obtaining crestor.   Past Medical History  Diagnosis Date  . DM 08/11/2007  . DIVERTICULITIS, ACUTE 12/01/2008  . HYPERCHOLESTEROLEMIA 04/03/2008  . DEPRESSION 10/19/2008  . HYPERTENSION 04/03/2008  . MYALGIA 04/03/2008  . 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 Osteoarthritis     Past Surgical History  Procedure Date  . Tonsillectomy   . Cesarean section   . Dilation and curettage of uterus   . Cervical cone biopsy 1996  . Total hip arthroplasty 2008  . Abdominal hysterectomy 08/22/2011    History   Social History  . Marital Status: Married    Spouse Name: N/A    Number of Children: N/A  . Years of Education: N/A   Occupational History  .  [employer]    works from home   Social History Main Topics  . Smoking status: Former Games developer  . Smokeless tobacco: Never Used  . Alcohol Use: No  . Drug Use: No  . Sexually Active: Yes    Birth Control/ Protection: IUD   Other Topics Concern  . Not on file   Social History Narrative   Pt is also a Consulting civil engineer.Regular exercise-yes    Current Outpatient Prescriptions on File Prior to Visit  Medication Sig Dispense Refill  . aspirin 81 MG tablet Take 81 mg by mouth daily.        . citalopram (CELEXA) 40 MG tablet TAKE 1 TABLET BY MOUTH EVERY DAY  30 tablet  8  . fluticasone (FLONASE) 50 MCG/ACT nasal spray Place 2 sprays into the nose daily.  16 g  6  . losartan-hydrochlorothiazide (HYZAAR) 100-25 MG per tablet TAKE ONE TABLET BY MOUTH DAILY  30 tablet  8  . albuterol (PROVENTIL HFA;VENTOLIN HFA) 108 (90 BASE) MCG/ACT inhaler Inhale 2 puffs into the lungs every 6 (six) hours as needed for wheezing.  1 Inhaler  0  .  etodolac (LODINE) 400 MG tablet Take 400 mg by mouth 2 (two) times daily.        . rosuvastatin (CRESTOR) 20 MG tablet Take 1 tablet (20 mg total) by mouth daily.  90 tablet  3    No Known Allergies  Family History  Problem Relation Age of Onset  . Cancer Mother     Pancreatic  Cancer  . Hypertension Father   . Heart disease Father     BP 128/92  Pulse 66  Temp 100.1 F (37.8 C) (Oral)  Ht 5\' 4"  (1.626 m)  Wt 250 lb (113.399 kg)  BMI 42.91 kg/m2  SpO2 97%  LMP 07/07/2011  Review of Systems  Constitutional: Negative for fever and unexpected weight change.  HENT: Negative for hearing loss.   Eyes: Negative for visual disturbance.  Respiratory: Negative for shortness of breath.   Cardiovascular: Negative for chest pain.  Gastrointestinal: Negative for anal bleeding.  Genitourinary: Negative for hematuria and vaginal bleeding.  Musculoskeletal: Negative for back pain.  Skin: Negative for wound.  Neurological: Negative for syncope, numbness and headaches.  Hematological: Does not bruise/bleed easily.  Psychiatric/Behavioral: Positive for dysphoric mood.       Objective:   Physical Exam VS: see  vs page GEN: no distress HEAD: head: no deformity eyes: no periorbital swelling, no proptosis external nose and ears are normal mouth: no lesion seen NECK: supple, thyroid is not enlarged CHEST WALL: no deformity LUNGS:  Clear to auscultation BREASTS:  sees gyn CV: reg rate and rhythm, no murmur ABD: abdomen is soft, nontender.  no hepatosplenomegaly.  not distended.  no hernia GENITALIA/RECTAL: sees gyn MUSCULOSKELETAL: muscle bulk and strength are grossly normal.  no obvious joint swelling.  gait is normal and steady EXTEMITIES: no deformity.  no ulcer on the feet.  feet are of normal color and temp.  no edema PULSES: dorsalis pedis intact bilat.  no carotid bruit NEURO:  cn 2-12 grossly intact.   readily moves all 4's.  sensation is intact to touch on the feet SKIN:   Normal texture and temperature.  No rash or suspicious lesion is visible.   NODES:  None palpable at the neck PSYCH: alert, oriented x3.  Does not appear anxious nor depressed.    Assessment & Plan:  Wellness visit today, with problems stable, except as noted.

## 2011-12-23 NOTE — Patient Instructions (Addendum)
good diet and exercise habits significanly improve the control of your diabetes.  please let me know if you wish to be referred to a dietician.  high blood sugar is very risky to your health.  you should see an eye doctor every year. controlling your blood pressure and cholesterol drastically reduces the damage diabetes does to your body.  this also applies to quitting smoking.  please discuss these with your doctor.  you should take an aspirin every day, unless you have been advised by a doctor not to. please consider these measures for your health:  minimize alcohol.  do not use tobacco products.  have a colonoscopy at least every 10 years from age 69.  Women should have an annual mammogram from age 22.  keep firearms safely stored.  always use seat belts.  have working smoke alarms in your home.  see an eye doctor and dentist regularly.  never drive under the influence of alcohol or drugs (including prescription drugs).  those with fair skin should take precautions against the sun. Please come back for a follow-up appointment in 6 months.

## 2012-01-24 ENCOUNTER — Other Ambulatory Visit: Payer: Self-pay | Admitting: Endocrinology

## 2012-03-11 ENCOUNTER — Other Ambulatory Visit: Payer: Self-pay | Admitting: Endocrinology

## 2012-07-08 ENCOUNTER — Other Ambulatory Visit (HOSPITAL_COMMUNITY)
Admission: RE | Admit: 2012-07-08 | Discharge: 2012-07-08 | Disposition: A | Discharge status: Home / Self Care | Payer: 59 | Source: Ambulatory Visit | Attending: Gynecology | Admitting: Gynecology

## 2012-07-08 ENCOUNTER — Ambulatory Visit (INDEPENDENT_AMBULATORY_CARE_PROVIDER_SITE_OTHER): Payer: 59 | Admitting: Gynecology

## 2012-07-08 ENCOUNTER — Encounter: Payer: Self-pay | Admitting: Gynecology

## 2012-07-08 VITALS — BP 140/92 | Ht 63.5 in | Wt 255.0 lb

## 2012-07-08 DIAGNOSIS — Z13820 Encounter for screening for osteoporosis: Secondary | ICD-10-CM

## 2012-07-08 DIAGNOSIS — E8941 Symptomatic postprocedural ovarian failure: Secondary | ICD-10-CM

## 2012-07-08 DIAGNOSIS — N949 Unspecified condition associated with female genital organs and menstrual cycle: Secondary | ICD-10-CM

## 2012-07-08 DIAGNOSIS — Z1151 Encounter for screening for human papillomavirus (HPV): Secondary | ICD-10-CM | POA: Insufficient documentation

## 2012-07-08 DIAGNOSIS — N898 Other specified noninflammatory disorders of vagina: Secondary | ICD-10-CM

## 2012-07-08 DIAGNOSIS — Z01419 Encounter for gynecological examination (general) (routine) without abnormal findings: Secondary | ICD-10-CM | POA: Insufficient documentation

## 2012-07-08 DIAGNOSIS — E8940 Asymptomatic postprocedural ovarian failure: Secondary | ICD-10-CM

## 2012-07-08 LAB — WET PREP FOR TRICH, YEAST, CLUE: Clue Cells Wet Prep HPF POC: NONE SEEN

## 2012-07-08 NOTE — Progress Notes (Signed)
Katherine Lawrence 30-Jul-1955 [id number]   History:    57 y.o.  for annual gyn exam who was complaining of vaginal odor but no true discharge. Patient is in frequently sexually active with her spouse. Patient with past history of TAH BSO by GYN oncologist Dr. 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 Patient has done well since her surgery. Her last mammogram was in 2012. Patient states she does her monthly self breast examination. Patient stated that approximately 15 years ago she had a cervical conization for severe dysplasia but her followup Pap smears have been normal. Her last colonoscopy was normal in 2010. Patient has not had a baseline bone densitometry testing done.  Past medical history,surgical history, family history and social history were all reviewed and documented in the EPIC chart.  Gynecologic History Patient's last menstrual period was 07/07/2011. Contraception: status post hysterectomy Last Pap: 2011. Results were: normal Last mammogram: 2012. Results were: normal  Obstetric History OB History    Grav Para Term Preterm Abortions TAB SAB Ect Mult Living   2 2        2      # Outc Date GA Lbr Len/2nd Wgt Sex Del Anes PTL Lv   1 PAR            2 PAR                ROS: A ROS was performed and pertinent positives and negatives are included in the history.  GENERAL: No fevers or chills. HEENT: No change in vision, no earache, sore throat or sinus congestion. NECK: No pain or stiffness. CARDIOVASCULAR: No chest pain or pressure. No palpitations. PULMONARY: No shortness of breath, cough or wheeze. GASTROINTESTINAL: No abdominal pain, nausea, vomiting or diarrhea, melena or bright red blood per rectum. GENITOURINARY: No urinary frequency, urgency, hesitancy or dysuria. MUSCULOSKELETAL: No joint or muscle pain, no back pain, no recent trauma. DERMATOLOGIC: No rash, no itching, no  lesions. ENDOCRINE: No polyuria, polydipsia, no heat or cold intolerance. No recent change in weight. HEMATOLOGICAL: No anemia or easy bruising or bleeding. NEUROLOGIC: No headache, seizures, numbness, tingling or weakness. PSYCHIATRIC: No depression, no loss of interest in normal activity or change in sleep pattern.     Exam: chaperone present  BP 140/92  Ht 5' 3.5" (1.613 m)  Wt 255 lb (115.667 kg)  BMI 44.46 kg/m2  LMP 07/07/2011  Body mass index is 44.46 kg/(m^2).  General appearance : Well developed well nourished female. No acute distress HEENT: Neck supple, trachea midline, no carotid bruits, no thyroidmegaly Lungs: Clear to auscultation, no rhonchi or wheezes, or rib retractions  Heart: Regular rate and rhythm, no murmurs or gallops Breast:Examined in sitting and supine position were symmetrical in appearance, no palpable masses or tenderness,  no skin retraction, no nipple inversion, no nipple discharge, no skin discoloration, no axillary or supraclavicular lymphadenopathy Abdomen: no palpable masses or tenderness, no rebound or guarding Extremities: no edema or skin discoloration or tenderness  Pelvic:  Bartholin, Urethra, Skene Glands: Within normal limits             Vagina: No gross lesions or discharge, second-degree rectocele  Cervix: Absent  Uterus  absent              Adnexa  Without masses or tenderness  Anus and perineum  normal   Rectovaginal  normal sphincter tone without palpated masses or tenderness  Hemoccult cards provided   Wet prep moderate bacteria rare WBC  Assessment/Plan:  57 y.o. female for annual exam with highly suspicious clinical evidence based on symptoms and fishy odor that she may have bacterial vaginosis and she will be prescribed Cleocin vaginal cream to apply each bedtime for 5 nights. Patient with a symptomatic second-degree rectocele. She will schedule a bone density study for next week. Since we never had an official report of  her cervical conization regardless of the new guidelines I would recommend that she have a Pap smear every year. Pap smear was done today. Dr. Everardo All her primary physician will be doing her lab work who is been monitoring her hypercholesterolemia and hypertension. She was reminded to submit to the office the Hemoccult cards for testing. A requisition for to schedule her mammogram was provided as well. We discussed importance of calcium vitamin D and regular exercise for osteoporosis prevention.    Ok Edwards MD, 5:31 PM 07/08/2012

## 2012-07-08 NOTE — Patient Instructions (Addendum)
Shingles Vaccine What You Need to Know WHAT IS SHINGLES?  Shingles is a painful skin rash, often with blisters. It is also called Herpes Zoster or just Zoster.  A shingles rash usually appears on one side of the face or body and lasts from 2 to 4 weeks. Its main symptom is pain, which can be quite severe. Other symptoms of shingles can include fever, headache, chills, and upset stomach. Very rarely, a shingles infection can lead to pneumonia, hearing problems, blindness, brain inflammation (encephalitis), or death.  For about 1 person in 5, severe pain can continue even after the rash clears up. This is called post-herpetic neuralgia.  Shingles is caused by the Varicella Zoster virus. This is the same virus that causes chickenpox. Only someone who has had a case of chickenpox or rarely, has gotten chickenpox vaccine, can get shingles. The virus stays in your body. It can reappear many years later to cause a case of shingles.  You cannot catch shingles from another person with shingles. However, a person who has never had chickenpox (or chickenpox vaccine) could get chickenpox from someone with shingles. This is not very common.  Shingles is far more common in people 50 and older than in younger people. It is also more common in people whose immune systems are weakened because of a disease such as cancer or drugs such as steroids or chemotherapy.  At least 1 million people get shingles per year in the [place]. SHINGLES VACCINE  A vaccine for shingles was licensed in 2006. In clinical trials, the vaccine reduced the risk of shingles by 50%. It can also reduce the pain in people who still get shingles after being vaccinated.  A single dose of shingles vaccine is recommended for adults 60 years of age and older. SOME PEOPLE SHOULD NOT GET SHINGLES VACCINE OR SHOULD WAIT A person should not get shingles vaccine if he or she:  Has ever had a life-threatening allergic reaction to gelatin, the  antibiotic neomycin, or any other component of shingles vaccine. Tell your caregiver if you have any severe allergies.  Has a weakened immune system because of current:  AIDS or another disease that affects the immune system.  Treatment with drugs that affect the immune system, such as prolonged use of high-dose steroids.  Cancer treatment, such as radiation or chemotherapy.  Cancer affecting the bone marrow or lymphatic system, such as leukemia or lymphoma.  Is pregnant, or might be pregnant. Women should not become pregnant until at least 4 weeks after getting shingles vaccine. Someone with a minor illness, such as a cold, may be vaccinated. Anyone with a moderate or severe acute illness should usually wait until he or she recovers before getting the vaccine. This includes anyone with a temperature of 101.3 F (38 C) or higher. WHAT ARE THE RISKS FROM SHINGLES VACCINE?  A vaccine, like any medicine, could possibly cause serious problems, such as severe allergic reactions. However, the risk of a vaccine causing serious harm, or death, is extremely small.  No serious problems have been identified with shingles vaccine. Mild Problems  Redness, soreness, swelling, or itching at the site of the injection (about 1 person in 3).  Headache (about 1 person in 70). Like all vaccines, shingles vaccine is being closely monitored for unusual or severe problems. WHAT IF THERE IS A MODERATE OR SEVERE REACTION? What should I look for? Any unusual condition, such as a severe allergic reaction or a high fever. If a severe allergic reaction   occurred, it would be within a few minutes to an hour after the shot. Signs of a serious allergic reaction can include difficulty breathing, weakness, hoarseness or wheezing, a fast heartbeat, hives, dizziness, paleness, or swelling of the throat. What should I do?  Call your caregiver, or get the person to a caregiver right away.  Tell the caregiver what  happened, the date and time it happened, and when the vaccination was given.  Ask the caregiver to report the reaction by filing a Vaccine Adverse Event Reporting System (VAERS) form. Or, you can file this report through the VAERS web site at www.vaers.LAgents.no or by calling 1-205 877 3462. VAERS does not provide medical advice. HOW CAN I LEARN MORE?  Ask your caregiver. He or she can give you the vaccine package insert or suggest other sources of information.  Contact the Centers for Disease Control and Prevention (CDC):  Call 210-286-1088 (1-800-CDC-INFO).  Visit the CDC website at PicCapture.uy CDC Shingles Vaccine VIS (03/21/08) Document Released: 03/30/2006 Document Revised: 08/25/2011 Document Reviewed: 03/21/2008 [facility] Patient Information 2013 [place], [place].  Bone Densitometry Bone densitometry is a special X-ray that measures your bone density and can be used to help predict your risk of bone fractures. This test is used to determine bone mineral content and density to diagnose osteoporosis. Osteoporosis is the loss of bone that may cause the bone to become weak. Osteoporosis commonly occurs in women entering menopause. However, it may be found in men and in people with other diseases. PREPARATION FOR TEST No preparation necessary. WHO SHOULD BE TESTED?  All women older than 72.  Postmenopausal women (50 to 68) with risk factors for osteoporosis.  People with a previous fracture caused by normal activities.  People with a small body frame (less than 127 poundsor a body mass index [BMI] of less than 21).  People who have a parent with a hip fracture or history of osteoporosis.  People who smoke.  People who have rheumatoid arthritis.  Anyone who engages in excessive alcohol use (more than 3 drinks most days).  Women who experience early menopause. WHEN SHOULD YOU BE RETESTED? Current guidelines suggest that you should wait at least 2 years before doing a  bone density test again if your first test was normal.Recent studies indicated that women with normal bone density may be able to wait a few years before needing to repeat a bone density test. You should discuss this with your caregiver.  NORMAL FINDINGS   Normal: less than standard deviation below normal (greater than -1).  Osteopenia: 1 to 2.5 standard deviations below normal (-1 to -2.5).  Osteoporosis: greater than 2.5 standard deviations below normal (less than -2.5). Test results are reported as a "T score" and a "Z score."The T score is a number that compares your bone density with the bone density of healthy, young women.The Z score is a number that compares your bone density with the scores of women who are the same age, gender, and race.  Ranges for normal findings may vary among different laboratories and hospitals. You should always check with your doctor after having lab work or other tests done to discuss the meaning of your test results and whether your values are considered within normal limits. MEANING OF TEST  Your caregiver will go over the test results with you and discuss the importance and meaning of your results, as well as treatment options and the need for additional tests if necessary. OBTAINING THE TEST RESULTS It is your responsibility to obtain  your test results. Ask the lab or department performing the test when and how you will get your results. Document Released: 06/24/2004 Document Revised: 08/25/2011 Document Reviewed: 07/17/2010 [facility] Patient Information 2013 [place], [place].

## 2012-08-13 ENCOUNTER — Encounter: Payer: Self-pay | Admitting: Gynecology

## 2012-09-28 ENCOUNTER — Ambulatory Visit (INDEPENDENT_AMBULATORY_CARE_PROVIDER_SITE_OTHER): Payer: 59

## 2012-09-28 DIAGNOSIS — Z13820 Encounter for screening for osteoporosis: Secondary | ICD-10-CM

## 2012-09-28 DIAGNOSIS — E8940 Asymptomatic postprocedural ovarian failure: Secondary | ICD-10-CM

## 2012-11-16 ENCOUNTER — Ambulatory Visit (INDEPENDENT_AMBULATORY_CARE_PROVIDER_SITE_OTHER): Payer: 59 | Admitting: Internal Medicine

## 2012-11-16 ENCOUNTER — Encounter: Payer: Self-pay | Admitting: Internal Medicine

## 2012-11-16 ENCOUNTER — Other Ambulatory Visit (INDEPENDENT_AMBULATORY_CARE_PROVIDER_SITE_OTHER): Payer: 59

## 2012-11-16 VITALS — BP 132/90 | HR 82 | Temp 98.4°F | Ht 64.0 in | Wt 260.0 lb

## 2012-11-16 DIAGNOSIS — E119 Type 2 diabetes mellitus without complications: Secondary | ICD-10-CM

## 2012-11-16 DIAGNOSIS — IMO0001 Reserved for inherently not codable concepts without codable children: Secondary | ICD-10-CM

## 2012-11-16 DIAGNOSIS — I10 Essential (primary) hypertension: Secondary | ICD-10-CM

## 2012-11-16 DIAGNOSIS — E7800 Pure hypercholesterolemia, unspecified: Secondary | ICD-10-CM

## 2012-11-16 DIAGNOSIS — M791 Myalgia, unspecified site: Secondary | ICD-10-CM

## 2012-11-16 DIAGNOSIS — Z23 Encounter for immunization: Secondary | ICD-10-CM

## 2012-11-16 DIAGNOSIS — Z0000 Encounter for general adult medical examination without abnormal findings: Secondary | ICD-10-CM

## 2012-11-16 LAB — CBC WITH DIFFERENTIAL/PLATELET
Basophils Absolute: 0 10*3/uL (ref 0.0–0.1)
Eosinophils Absolute: 0.3 10*3/uL (ref 0.0–0.7)
HCT: 45.6 % (ref 36.0–46.0)
Lymphs Abs: 1.9 10*3/uL (ref 0.7–4.0)
MCHC: 33 g/dL (ref 30.0–36.0)
MCV: 88.5 fl (ref 78.0–100.0)
Monocytes Absolute: 0.4 10*3/uL (ref 0.1–1.0)
Monocytes Relative: 3.9 % (ref 3.0–12.0)
Neutro Abs: 6.5 10*3/uL (ref 1.4–7.7)
Platelets: 219 10*3/uL (ref 150.0–400.0)
RDW: 14.7 % — ABNORMAL HIGH (ref 11.5–14.6)

## 2012-11-16 LAB — MICROALBUMIN / CREATININE URINE RATIO
Creatinine,U: 103.2 mg/dL
Microalb, Ur: 11.4 mg/dL — ABNORMAL HIGH (ref 0.0–1.9)

## 2012-11-16 LAB — SEDIMENTATION RATE: Sed Rate: 11 mm/hr (ref 0–22)

## 2012-11-16 LAB — BASIC METABOLIC PANEL
BUN: 18 mg/dL (ref 6–23)
CO2: 26 mEq/L (ref 19–32)
GFR: 119.56 mL/min (ref >60.00)
Glucose, Bld: 116 mg/dL — ABNORMAL HIGH (ref 70–99)
Potassium: 4.3 mEq/L (ref 3.5–5.1)

## 2012-11-16 LAB — URINALYSIS, ROUTINE W REFLEX MICROSCOPIC
Nitrite: NEGATIVE
Total Protein, Urine: 30
pH: 6 (ref 5.0–8.0)

## 2012-11-16 LAB — HEPATIC FUNCTION PANEL
ALT: 17 U/L (ref 0–35)
Bilirubin, Direct: 0.1 mg/dL (ref 0.0–0.3)
Total Bilirubin: 0.7 mg/dL (ref 0.3–1.2)

## 2012-11-16 LAB — LIPID PANEL
HDL: 32.1 mg/dL — ABNORMAL LOW (ref >39.00)
Total CHOL/HDL Ratio: 7

## 2012-11-16 LAB — CK: Total CK: 110 U/L (ref 7–177)

## 2012-11-16 LAB — LDL CHOLESTEROL, DIRECT: Direct LDL: 157.6 mg/dL

## 2012-11-16 MED ORDER — ROSUVASTATIN CALCIUM 10 MG PO TABS: 10.0000 mg | ORAL_TABLET | Freq: Every day | ORAL | Status: DC

## 2012-11-16 NOTE — Patient Instructions (Signed)
It was good to see you today. We have reviewed your prior records including labs and tests today Health Maintenance reviewed - all recommended immunizations and age-appropriate screenings are up-to-date. Medications reviewed and updated, no changes recommended at this time. Test(s) ordered today. Your results will be released to MyChart (or called to you) after review, usually within 72hours after test completion. If any changes need to be made, you will be notified at that same time. Work on lifestyle changes as discussed (low fat, low carb, increased protein diet; improved exercise efforts; weight loss) to control sugar, blood pressure and cholesterol levels and/or reduce risk of developing other medical problems. Look into [email or web address].com.cy or other type of food journal to assist you in this process. Please schedule followup in 6-12 months, call sooner if problems.  Health Maintenance, Females A healthy lifestyle and preventative care can promote health and wellness.  Maintain regular health, dental, and eye exams.  Eat a healthy diet. Foods like vegetables, fruits, whole grains, low-fat dairy products, and lean protein foods contain the nutrients you need without too many calories. Decrease your intake of foods high in solid fats, added sugars, and salt. Get information about a proper diet from your caregiver, if necessary.  Regular physical exercise is one of the most important things you can do for your health. Most adults should get at least 150 minutes of moderate-intensity exercise (any activity that increases your heart rate and causes you to sweat) each week. In addition, most adults need muscle-strengthening exercises on 2 or more days a week.   Maintain a healthy weight. The body mass index (BMI) is a screening tool to identify possible weight problems. It provides an estimate of body fat based on height and weight. Your caregiver can help determine your BMI, and can help you achieve or  maintain a healthy weight. For adults 20 years and older:  A BMI below 18.5 is considered underweight.  A BMI of 18.5 to 24.9 is normal.  A BMI of 25 to 29.9 is considered overweight.  A BMI of 30 and above is considered obese.  Maintain normal blood lipids and cholesterol by exercising and minimizing your intake of saturated fat. Eat a balanced diet with plenty of fruits and vegetables. Blood tests for lipids and cholesterol should begin at age 62 and be repeated every 5 years. If your lipid or cholesterol levels are high, you are over 50, or you are a high risk for heart disease, you may need your cholesterol levels checked more frequently.Ongoing high lipid and cholesterol levels should be treated with medicines if diet and exercise are not effective.  If you smoke, find out from your caregiver how to quit. If you do not use tobacco, do not start.  If you are pregnant, do not drink alcohol. If you are breastfeeding, be very cautious about drinking alcohol. If you are not pregnant and choose to drink alcohol, do not exceed 1 drink per day. One drink is considered to be 12 ounces (355 mL) of beer, 5 ounces (148 mL) of wine, or 1.5 ounces (44 mL) of liquor.  Avoid use of street drugs. Do not share needles with anyone. Ask for help if you need support or instructions about stopping the use of drugs.  High blood pressure causes heart disease and increases the risk of stroke. Blood pressure should be checked at least every 1 to 2 years. Ongoing high blood pressure should be treated with medicines, if weight loss and exercise are not effective.  If you are 51 to 55 years old, ask your caregiver if you should take aspirin to prevent strokes.  Diabetes screening involves taking a blood sample to check your fasting blood sugar level. This should be done once every 3 years, after age 81, if you are within normal weight and without risk factors for diabetes. Testing should be considered at a younger  age or be carried out more frequently if you are overweight and have at least 1 risk factor for diabetes.  Breast cancer screening is essential preventative care for women. You should practice "breast self-awareness." This means understanding the normal appearance and feel of your breasts and may include breast self-examination. Any changes detected, no matter how small, should be reported to a caregiver. Women in their 36s and 30s should have a clinical breast exam (CBE) by a caregiver as part of a regular health exam every 1 to 3 years. After age 54, women should have a CBE every year. Starting at age 37, women should consider having a mammogram (breast X-ray) every year. Women who have a family history of breast cancer should talk to their caregiver about genetic screening. Women at a high risk of breast cancer should talk to their caregiver about having an MRI and a mammogram every year.  The Pap test is a screening test for cervical cancer. Women should have a Pap test starting at age 41. Between ages 55 and 9, Pap tests should be repeated every 2 years. Beginning at age 24, you should have a Pap test every 3 years as long as the past 3 Pap tests have been normal. If you had a hysterectomy for a problem that was not cancer or a condition that could lead to cancer, then you no longer need Pap tests. If you are between ages 63 and 47, and you have had normal Pap tests going back 10 years, you no longer need Pap tests. If you have had past treatment for cervical cancer or a condition that could lead to cancer, you need Pap tests and screening for cancer for at least 20 years after your treatment. If Pap tests have been discontinued, risk factors (such as a new sexual partner) need to be reassessed to determine if screening should be resumed. Some women have medical problems that increase the chance of getting cervical cancer. In these cases, your caregiver may recommend more frequent screening and Pap  tests.  The human papillomavirus (HPV) test is an additional test that may be used for cervical cancer screening. The HPV test looks for the virus that can cause the cell changes on the cervix. The cells collected during the Pap test can be tested for HPV. The HPV test could be used to screen women aged 63 years and older, and should be used in women of any age who have unclear Pap test results. After the age of 39, women should have HPV testing at the same frequency as a Pap test.  Colorectal cancer can be detected and often prevented. Most routine colorectal cancer screening begins at the age of 90 and continues through age 37. However, your caregiver may recommend screening at an earlier age if you have risk factors for colon cancer. On a yearly basis, your caregiver may provide home test kits to check for hidden blood in the stool. Use of a small camera at the end of a tube, to directly examine the colon (sigmoidoscopy or colonoscopy), can detect the earliest forms of colorectal cancer. Talk to your  caregiver about this at age 97, when routine screening begins. Direct examination of the colon should be repeated every 5 to 10 years through age 7, unless early forms of pre-cancerous polyps or small growths are found.  Hepatitis C blood testing is recommended for all people born from [birth year] through 1963 and any individual with known risks for hepatitis C.  Practice safe sex. Use condoms and avoid high-risk sexual practices to reduce the spread of sexually transmitted infections (STIs). Sexually active women aged 35 and younger should be checked for Chlamydia, which is a common sexually transmitted infection. Older women with new or multiple partners should also be tested for Chlamydia. Testing for other STIs is recommended if you are sexually active and at increased risk.  Osteoporosis is a disease in which the bones lose minerals and strength with aging. This can result in serious bone fractures. The risk  of osteoporosis can be identified using a bone density scan. Women ages 20 and over and women at risk for fractures or osteoporosis should discuss screening with their caregivers. Ask your caregiver whether you should be taking a calcium supplement or vitamin D to reduce the rate of osteoporosis.  Menopause can be associated with physical symptoms and risks. Hormone replacement therapy is available to decrease symptoms and risks. You should talk to your caregiver about whether hormone replacement therapy is right for you.  Use sunscreen with a sun protection factor (SPF) of 30 or greater. Apply sunscreen liberally and repeatedly throughout the day. You should seek shade when your shadow is shorter than you. Protect yourself by wearing long sleeves, pants, a wide-brimmed hat, and sunglasses year round, whenever you are outdoors.  Notify your caregiver of new moles or changes in moles, especially if there is a change in shape or color. Also notify your caregiver if a mole is larger than the size of a pencil eraser.  Stay current with your immunizations. Document Released: 12/16/2010 Document Revised: 08/25/2011 Document Reviewed: 12/16/2010 [facility] Patient Information 2014 [place], [place]. Exercise to Lose Weight Exercise and a healthy diet may help you lose weight. Your doctor may suggest specific exercises. EXERCISE IDEAS AND TIPS  Choose low-cost things you enjoy doing, such as walking, bicycling, or exercising to workout videos.  Take stairs instead of the elevator.  Walk during your lunch break.  Park your car further away from work or school.  Go to a gym or an exercise class.  Start with 5 to 10 minutes of exercise each day. Build up to 30 minutes of exercise 4 to 6 days a week.  Wear shoes with good support and comfortable clothes.  Stretch before and after working out.  Work out until you breathe harder and your heart beats faster.  Drink extra water when you exercise.  Do  not do so much that you hurt yourself, feel dizzy, or get very short of breath. Exercises that burn about 150 calories:  Running 1  miles in 15 minutes.  Playing volleyball for 45 to 60 minutes.  Washing and waxing a car for 45 to 60 minutes.  Playing touch football for 45 minutes.  Walking 1  miles in 35 minutes.  Pushing a stroller 1  miles in 30 minutes.  Playing basketball for 30 minutes.  Raking leaves for 30 minutes.  Bicycling 5 miles in 30 minutes.  Walking 2 miles in 30 minutes.  Dancing for 30 minutes.  Shoveling snow for 15 minutes.  Swimming laps for 20 minutes.  Walking up stairs  for 15 minutes.  Bicycling 4 miles in 15 minutes.  Gardening for 30 to 45 minutes.  Jumping rope for 15 minutes.  Washing windows or floors for 45 to 60 minutes. Document Released: 07/05/2010 Document Revised: 08/25/2011 Document Reviewed: 07/05/2010 [facility] Patient Information 2014 [place], [place].

## 2012-11-16 NOTE — Assessment & Plan Note (Signed)
Has been rx crestor in past, but none in past 39mo due  Check now, reconsider statin if needed Also The patient is asked to make an attempt to improve diet and exercise patterns to aid in medical management of this problem.

## 2012-11-16 NOTE — Assessment & Plan Note (Signed)
BP Readings from Last 3 Encounters:  11/16/12 132/90  07/08/12 140/92  12/23/11 128/92   The current medical regimen is effective;  continue present plan and medications.

## 2012-11-16 NOTE — Assessment & Plan Note (Signed)
Diet controlled since 2010 (or longer) Check a1c annually The patient is asked to make an attempt to improve diet and exercise patterns to aid in medical management of this problem.  Lab Results  Component Value Date   HGBA1C 6.6* 12/16/2011

## 2012-11-16 NOTE — Progress Notes (Signed)
Subjective:    Patient ID: Katherine Lawrence, female    DOB: 04/23/1956, 57 y.o.   MRN: [id number]  HPI  New patient to me, here to establish with new PCP - has formerly followed with Dr. Everardo All Here for annual physical and labs, and generally feels well today  Also reviewed chronic medical issues: Type 2 diabetes, diet controlled - monitors diet and weight but does not check cbgs - remotely on medications, but none in past 3 years  Hypertension - the patient reports compliance with medication(s) as prescribed. Denies adverse side effects.  Dyslipidemia - previously prescribed Crestor but noncompliant with same in past 98mo because of "rx mix up"  Depression history, controlled at this time with daily citalopram - denies mood swings or irritability. Denies SI/HI  Osteoarthritis - hx THR - no joint swelling   Past Medical History  Diagnosis Date  . Diabetes type 2, controlled   . HYPERCHOLESTEROLEMIA   . DEPRESSION   . HYPERTENSION   . 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 Osteoarthritis   . SLEEP APNEA   . Postsurgical menopause   . Ovarian cyst    Family History  Problem Relation Age of Onset  . Pancreatic cancer Mother   . Hypertension Father   . Heart disease Father    History  Substance Use Topics  . Smoking status: Former Smoker    Quit date: 07/08/2010  . Smokeless tobacco: Never Used  . Alcohol Use: No    Review of Systems  Constitutional: Positive for fatigue (x 6 mo). Negative for fever, appetite change and unexpected weight change.  Respiratory: Negative for cough and shortness of breath.   Cardiovascular: Negative for chest pain and leg swelling.  Gastrointestinal: Negative for nausea, vomiting, diarrhea, constipation and blood in stool.  Genitourinary: Negative for dysuria and flank pain.  Musculoskeletal: Positive for myalgias (diffuse x 6 mo). Negative for joint swelling.  Skin: Negative for rash and wound.   Neurological: Positive for weakness. Negative for facial asymmetry and light-headedness.  No other specific complaints in a complete review of systems (except as listed in HPI above).     Objective:   Physical Exam BP 132/90  Pulse 82  Temp(Src) 98.4 F (36.9 C) (Oral)  Ht 5\' 4"  (1.626 m)  Wt 260 lb (117.935 kg)  BMI 44.61 kg/m2  SpO2 94%  LMP 07/07/2011 Wt Readings from Last 3 Encounters:  11/16/12 260 lb (117.935 kg)  07/08/12 255 lb (115.667 kg)  12/23/11 250 lb (113.399 kg)   Constitutional: She is overweight, but appears well-developed and well-nourished. No distress.  HENT: Head: Normocephalic and atraumatic. Ears: B TMs ok, no erythema or effusion; Nose: Nose normal. Mouth/Throat: Oropharynx is clear and moist. No oropharyngeal exudate.  Eyes: Conjunctivae and EOM are normal. Pupils are equal, round, and reactive to light. No scleral icterus.  Neck: Normal range of motion. Neck supple. No JVD or LAD present. No thyromegaly present.  Cardiovascular: Normal rate, regular rhythm and normal heart sounds.  No murmur heard. No BLE edema. Pulmonary/Chest: Effort normal and breath sounds normal. No respiratory distress. She has no wheezes.  Abdominal: Soft. Bowel sounds are normal. She exhibits no distension. There is no tenderness. no masses Musculoskeletal: Proximal weakness of bilateral upper extremities or thighs. Normal range of motion, no joint effusions. No gross deformities Neurological: She is alert and oriented to person, place, and time. No cranial nerve deficit. Coordination, balance, strength, speech  and gait are normal.  Skin: Skin is warm and dry. No rash noted. No erythema.  Psychiatric: She has a normal mood and affect. Her behavior is normal. Judgment and thought content normal.   Lab Results  Component Value Date   WBC 10.1 12/16/2011   HGB 14.6 12/16/2011   HCT 44.5 12/16/2011   PLT 231.0 12/16/2011   GLUCOSE 107* 12/16/2011   CHOL 234* 12/16/2011   TRIG 135.0  12/16/2011   HDL 37.60* 12/16/2011   LDLDIRECT 179.6 12/16/2011   LDLCALC 96 11/14/2010   ALT 16 12/16/2011   AST 14 12/16/2011   NA 141 12/16/2011   K 4.3 12/16/2011   CL 101 12/16/2011   CREATININE 0.5 12/16/2011   BUN 11 12/16/2011   CO2 27 12/16/2011   TSH 3.02 12/16/2011   HGBA1C 6.6* 12/16/2011   MICROALBUR 3.2* 08/11/2007       Assessment & Plan:   CPX/v70.0 - Patient has been counseled on age-appropriate routine health concerns for screening and prevention. These are reviewed and up-to-date. Immunizations are up-to-date or declined. Labs ordered and reviewed.  Myalgias - associated with overwhelming fatigue Progressive over past 6 mo No definitive weakness appreciated on exam - no trigger points Check ESR and CK as well as other labs with cpx screening  Also See problem list. Medications and labs reviewed today.

## 2012-11-16 NOTE — Addendum Note (Signed)
Addended by: Rene Paci A on: 11/16/2012 09:33 PM   Modules accepted: Orders

## 2013-03-03 ENCOUNTER — Other Ambulatory Visit: Payer: Self-pay | Admitting: Endocrinology

## 2013-04-21 ENCOUNTER — Other Ambulatory Visit: Payer: Self-pay

## 2013-05-04 ENCOUNTER — Ambulatory Visit: Payer: 59 | Admitting: Family Medicine

## 2013-05-04 VITALS — BP 138/78 | HR 90 | Temp 99.0°F | Resp 18 | Ht 63.5 in | Wt 258.2 lb

## 2013-05-04 DIAGNOSIS — J209 Acute bronchitis, unspecified: Secondary | ICD-10-CM

## 2013-05-04 MED ORDER — DOXYCYCLINE HYCLATE 100 MG PO CAPS: 100.0000 mg | ORAL_CAPSULE | Freq: Two times a day (BID) | ORAL | Status: DC

## 2013-05-04 MED ORDER — GUAIFENESIN-CODEINE 100-10 MG/5ML PO SOLN: 5.0000 mL | Freq: Every evening | ORAL | Status: DC | PRN

## 2013-05-04 NOTE — Patient Instructions (Signed)
Thank you for coming in today Take doxycycline 2x per day for 7 days Try codeine at night Try Mucinex D during the day Followup for worsening fever, chills, vomiting, trouble breathing  Bronchitis Bronchitis is the body's way of reacting to injury and/or infection (inflammation) of the bronchi. Bronchi are the air tubes that extend from the windpipe into the lungs. If the inflammation becomes severe, it may cause shortness of breath. CAUSES  Inflammation may be caused by:  A virus.  Germs (bacteria).  Dust.  Allergens.  Pollutants and many other irritants. The cells lining the bronchial tree are covered with tiny hairs (cilia). These constantly beat upward, away from the lungs, toward the mouth. This keeps the lungs free of pollutants. When these cells become too irritated and are unable to do their job, mucus begins to develop. This causes the characteristic cough of bronchitis. The cough clears the lungs when the cilia are unable to do their job. Without either of these protective mechanisms, the mucus would settle in the lungs. Then you would develop pneumonia. Smoking is a common cause of bronchitis and can contribute to pneumonia. Stopping this habit is the single most important thing you can do to help yourself. TREATMENT   Your caregiver may prescribe an antibiotic if the cough is caused by bacteria. Also, medicines that open up your airways make it easier to breathe. Your caregiver may also recommend or prescribe an expectorant. It will loosen the mucus to be coughed up. Only take over-the-counter or prescription medicines for pain, discomfort, or fever as directed by your caregiver.  Removing whatever causes the problem (smoking, for example) is critical to preventing the problem from getting worse.  Cough suppressants may be prescribed for relief of cough symptoms.  Inhaled medicines may be prescribed to help with symptoms now and to help prevent problems from  returning.  For those with recurrent (chronic) bronchitis, there may be a need for steroid medicines. SEEK IMMEDIATE MEDICAL CARE IF:   During treatment, you develop more pus-like mucus (purulent sputum).  You have a fever.  You become progressively more ill.  You have increased difficulty breathing, wheezing, or shortness of breath. It is necessary to seek immediate medical care if you are elderly or sick from any other disease. MAKE SURE YOU:   Understand these instructions.  Will watch your condition.  Will get help right away if you are not doing well or get worse. Document Released: 06/02/2005 Document Revised: 02/02/2013 Document Reviewed: 01/25/2013 [facility] Patient Information 2014 [place], [place].

## 2013-05-04 NOTE — Addendum Note (Signed)
Addended by: Daine Gip on: 05/04/2013 06:59 PM   Modules accepted: Level of Service

## 2013-05-04 NOTE — Progress Notes (Signed)
  Subjective:    Patient ID: Katherine Lawrence, female    DOB: 1955-07-05, 57 y.o.   MRN: [id number]  HPI Patient presents with respiratory illness since last Tuesday, 8 days ago. Unfortunately, she has been getting worse for the last 2 days. Her son was sick 2 weeks ago and she thinks she got it from him. She has cough, chest congestion, ear pressure, postnasal drip. No sinus congestion or pressure or headache. No N/V/D. Feels mildly SOB. Has a history of "a touch of asthma" but is not on any asthma medications at this time.  Review of Systems  Constitutional: Negative for fever and chills.  HENT: Positive for ear pain and postnasal drip. Negative for congestion, rhinorrhea and sinus pressure.   Eyes: Negative.   Respiratory: Positive for cough, chest tightness and shortness of breath. Negative for wheezing.   Cardiovascular: Negative.   Gastrointestinal: Negative.   All other systems reviewed and are negative.      Objective:   Physical Exam  Constitutional: She is oriented to person, place, and time. She appears well-developed and well-nourished. No distress.  HENT:  Head: Normocephalic and atraumatic.  Mouth/Throat: Oropharynx is clear and moist. No oropharyngeal exudate.  Eyes: Conjunctivae are normal. Pupils are equal, round, and reactive to light. No scleral icterus.  Neck: Normal range of motion.  Cardiovascular: Normal rate and regular rhythm.   No murmur heard. Pulmonary/Chest: Effort normal and breath sounds normal. No respiratory distress. She has no wheezes. She has no rales.  Musculoskeletal: Normal range of motion.  Lymphadenopathy:    She has no cervical adenopathy.  Neurological: She is alert and oriented to person, place, and time.  Skin: Skin is warm and dry. She is not diaphoretic.  Psychiatric: She has a normal mood and affect. Her behavior is normal.  Effusions behind both TMs    Assessment & Plan:  #1. Acute bronchitis - Since patient worsening > 1 week into  illness, will treat with doxy x 7 days - Codeine cough syrup at night - Mucinex D during the day - F/u if develop fever, vomiting, SOB

## 2013-05-18 ENCOUNTER — Ambulatory Visit: Payer: 59 | Admitting: Internal Medicine

## 2013-05-27 ENCOUNTER — Other Ambulatory Visit: Payer: Self-pay | Admitting: Endocrinology

## 2013-06-02 ENCOUNTER — Other Ambulatory Visit: Payer: Self-pay | Admitting: *Deleted

## 2013-06-10 ENCOUNTER — Other Ambulatory Visit: Payer: Self-pay | Admitting: Internal Medicine

## 2013-06-22 ENCOUNTER — Ambulatory Visit (INDEPENDENT_AMBULATORY_CARE_PROVIDER_SITE_OTHER): Payer: 59 | Admitting: Internal Medicine

## 2013-06-22 ENCOUNTER — Encounter: Payer: Self-pay | Admitting: Internal Medicine

## 2013-06-22 ENCOUNTER — Other Ambulatory Visit (INDEPENDENT_AMBULATORY_CARE_PROVIDER_SITE_OTHER): Payer: 59

## 2013-06-22 VITALS — BP 130/92 | HR 77 | Temp 97.1°F | Wt 261.4 lb

## 2013-06-22 DIAGNOSIS — Z79899 Other long term (current) drug therapy: Secondary | ICD-10-CM

## 2013-06-22 DIAGNOSIS — E7800 Pure hypercholesterolemia, unspecified: Secondary | ICD-10-CM

## 2013-06-22 DIAGNOSIS — E119 Type 2 diabetes mellitus without complications: Secondary | ICD-10-CM

## 2013-06-22 DIAGNOSIS — R41840 Attention and concentration deficit: Secondary | ICD-10-CM

## 2013-06-22 LAB — LIPID PANEL
CHOLESTEROL: 200 mg/dL (ref 0–200)
HDL: 37.9 mg/dL — ABNORMAL LOW (ref >39.00)
LDL CALC: 139 mg/dL — AB (ref 0–99)
Total CHOL/HDL Ratio: 5
Triglycerides: 115 mg/dL (ref 0.0–149.0)
VLDL: 23 mg/dL (ref 0.0–40.0)

## 2013-06-22 LAB — HEPATIC FUNCTION PANEL
ALT: 18 U/L (ref 0–35)
AST: 20 U/L (ref 0–37)
Albumin: 4.5 g/dL (ref 3.5–5.2)
Alkaline Phosphatase: 59 U/L (ref 39–117)
BILIRUBIN DIRECT: 0.2 mg/dL (ref 0.0–0.3)
BILIRUBIN TOTAL: 0.9 mg/dL (ref 0.3–1.2)
Total Protein: 7.2 g/dL (ref 6.0–8.3)

## 2013-06-22 LAB — HEMOGLOBIN A1C: Hgb A1c MFr Bld: 7.3 % — ABNORMAL HIGH (ref 4.6–6.5)

## 2013-06-22 NOTE — Assessment & Plan Note (Signed)
Diet controlled since 2010 (or longer) Check a1c semi annually and prn The patient is asked to make an attempt to improve diet and exercise patterns to aid in medical management of this problem.  Lab Results  Component Value Date   HGBA1C 6.8* 11/16/2012

## 2013-06-22 NOTE — Progress Notes (Signed)
  Subjective:    Patient ID: Katherine Lawrence, female    DOB: 02-Feb-1956, 58 y.o.   MRN: [id number]  HPI  Here for follow up - reviewed chronic medical issues and interval events:  Type 2 diabetes, diet controlled - monitors diet and weight but does not check cbgs - remotely on medications, but none in past 3 years  Hypertension - the patient reports compliance with medication(s) as prescribed. Denies adverse side effects.  Dyslipidemia - resumed Crestor 11/2012 - the patient reports compliance with medication(s) as prescribed. Denies adverse side effects.  Depression history, controlled at this time with daily citalopram - denies mood swings or irritability. Denies SI/HI  Osteoarthritis - hx THR - no joint swelling   Past Medical History  Diagnosis Date  . Diabetes type 2, controlled   . HYPERCHOLESTEROLEMIA   . DEPRESSION   . HYPERTENSION   . 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 Osteoarthritis   . SLEEP APNEA   . Postsurgical menopause   . Ovarian cyst     Review of Systems  Constitutional: Negative for fever and fatigue.  Respiratory: Negative for cough and shortness of breath.   Cardiovascular: Negative for chest pain and leg swelling.  Psychiatric/Behavioral: Positive for decreased concentration (since grade school, worse with data processing job). Negative for dysphoric mood.       Objective:   Physical Exam BP 130/92  Pulse 77  Temp(Src) 97.1 F (36.2 C) (Oral)  Wt 261 lb 6.4 oz (118.57 kg)  SpO2 94%  LMP 07/07/2011 Wt Readings from Last 3 Encounters:  06/22/13 261 lb 6.4 oz (118.57 kg)  05/04/13 258 lb 3.2 oz (117.119 kg)  11/16/12 260 lb (117.935 kg)   Constitutional: She is overweight, but appears well-developed and well-nourished. No distress.  Neck: Normal range of motion. Neck supple. No JVD or LAD present. No thyromegaly present.  Cardiovascular: Normal rate, regular rhythm and normal heart sounds.  No murmur  heard. No BLE edema. Pulmonary/Chest: Effort normal and breath sounds normal. No respiratory distress. She has no wheezes. Skin: Skin is warm and dry. No rash noted. No erythema.  Psychiatric: She has a normal mood and affect. Her behavior is normal. Judgment and thought content normal.   Lab Results  Component Value Date   WBC 9.0 11/16/2012   HGB 15.1* 11/16/2012   HCT 45.6 11/16/2012   PLT 219.0 11/16/2012   GLUCOSE 116* 11/16/2012   CHOL 210* 11/16/2012   TRIG 163.0* 11/16/2012   HDL 32.10* 11/16/2012   LDLDIRECT 157.6 11/16/2012   LDLCALC 96 11/14/2010   ALT 17 11/16/2012   AST 16 11/16/2012   NA 140 11/16/2012   K 4.3 11/16/2012   CL 105 11/16/2012   CREATININE 0.6 11/16/2012   BUN 18 11/16/2012   CO2 26 11/16/2012   TSH 2.08 11/16/2012   HGBA1C 6.8* 11/16/2012   MICROALBUR 11.4* 11/16/2012       Assessment & Plan:   See problem list. Medications and labs reviewed today.  Decreased concentration, presence chronic condition since grade school. Currently exacerbated by data processing job - ?ADD or other learning disorder - Pt request further testing for same to consider med if needed

## 2013-06-22 NOTE — Assessment & Plan Note (Signed)
Ongoing since grade school Exacerbated by data processing job with [employer] ?ADD or learning disorder -  request eval for same- refer for battery testing with behav health Consider meds as needed

## 2013-06-22 NOTE — Patient Instructions (Addendum)
It was good to see you today.  We have reviewed your prior records including labs and tests today  Medications reviewed and updated, no changes recommended at this time. Will look for change in statin per formulary as directed  Test(s) ordered today. Your results will be released to [place] (or called to you) after review, usually within 72hours after test completion. If any changes need to be made, you will be notified at that same time.  we'll make referral to behavioral health for battery testing of possible ADD or learning disorder . Our office will contact you regarding appointment(s) once made.  Continue to work on lifestyle changes as discussed (low fat, low carb, increased protein diet; improved exercise efforts; weight loss) to control sugar, blood pressure and cholesterol levels and/or reduce risk of developing other medical problems. Look into [email or web address].[email or web address] or other type of food journal to assist you in this process.  Please schedule followup in 6 months for annual physical and diabetes/weight check, call sooner if problems.  Diabetes and Standards of Medical Care  Diabetes is complicated. You may find that your diabetes team includes a dietitian, nurse, diabetes educator, eye doctor, and more. To help everyone know what is going on and to help you get the care you deserve, the following schedule of care was developed to help keep you on track. Below are the tests, exams, vaccines, medicines, education, and plans you will need. HbA1c test This test shows how well you have controlled your glucose over the past 2 3 months. It is used to see if your diabetes management plan needs to be adjusted.   It is performed at least 2 times a year if you are meeting treatment goals.  It is performed 4 times a year if therapy has changed or if you are not meeting treatment goals. Blood pressure test  This test is performed at every routine medical visit. The goal is less than 140/90 mmHg for  most people, but 130/80 mmHg in some cases. Ask your health care provider about your goal. Dental exam  Follow up with the dentist regularly. Eye exam  If you are diagnosed with type 1 diabetes as a child, get an exam upon reaching the age of 39 years or older and have had diabetes for 3 5 years. Yearly eye exams are recommended after that initial eye exam.  If you are diagnosed with type 1 diabetes as an adult, get an exam within 5 years of diagnosis and then yearly.  If you are diagnosed with type 2 diabetes, get an exam as soon as possible after the diagnosis and then yearly. Foot care exam  Visual foot exams are performed at every routine medical visit. The exams check for cuts, injuries, or other problems with the feet.  A comprehensive foot exam should be done yearly. This includes visual inspection as well as assessing foot pulses and testing for loss of sensation.  Check your feet nightly for cuts, injuries, or other problems with your feet. Tell your health care provider if anything is not healing. Kidney function test (urine microalbumin)  This test is performed once a year.  Type 1 diabetes: The first test is performed 5 years after diagnosis.  Type 2 diabetes: The first test is performed at the time of diagnosis.  A serum creatinine and estimated glomerular filtration rate (eGFR) test is done once a year to assess the level of chronic kidney disease (CKD), if present. Lipid profile (cholesterol, HDL, LDL, triglycerides)  Performed every  5 years for most people.  The goal for LDL is less than 100 mg/dL. If you are at high risk, the goal is less than 70 mg/dL.  The goal for HDL is 40 mg/dL 50 mg/dL for men and 50 mg/dL 60 mg/dL for women. An HDL cholesterol of 60 mg/dL or higher gives some protection against heart disease.  The goal for triglycerides is less than 150 mg/dL. Influenza vaccine, pneumococcal vaccine, and hepatitis B vaccine  The influenza vaccine is  recommended yearly.  The pneumococcal vaccine is generally given once in a lifetime. However, there are some instances when another vaccination is recommended. Check with your health care provider.  The hepatitis B vaccine is also recommended for adults with diabetes. Diabetes self-management education  Education is recommended at diagnosis and ongoing as needed. Treatment plan  Your treatment plan is reviewed at every medical visit. Document Released: 03/30/2009 Document Revised: 02/02/2013 Document Reviewed: 11/02/2012 [facility] Patient Information 2014 [place].

## 2013-06-22 NOTE — Progress Notes (Signed)
Pre-visit discussion using our clinic review tool. No additional management support is needed unless otherwise documented below in the visit note.  

## 2013-06-22 NOTE — Assessment & Plan Note (Signed)
Resumed crestor June 2014 Check now, titrate statin as needed Also The patient is asked to make an attempt to improve diet and exercise patterns to aid in medical management of this problem.

## 2013-06-23 ENCOUNTER — Encounter: Payer: Self-pay | Admitting: Internal Medicine

## 2013-06-23 MED ORDER — METFORMIN HCL 500 MG PO TABS: 500.0000 mg | ORAL_TABLET | Freq: Two times a day (BID) | ORAL | Status: DC

## 2013-06-23 NOTE — Addendum Note (Signed)
Addended by: Gwendolyn Grant A on: 06/23/2013 08:42 AM   Modules accepted: Orders

## 2013-06-28 ENCOUNTER — Encounter: Payer: Self-pay | Admitting: Internal Medicine

## 2013-06-29 MED ORDER — ATORVASTATIN CALCIUM 20 MG PO TABS: 20.0000 mg | ORAL_TABLET | Freq: Every day | ORAL | Status: DC

## 2013-07-14 NOTE — Progress Notes (Signed)
Reviewed documentation and agree w/ assessment and plan. Eva Shaw, MD MPH 

## 2013-08-02 ENCOUNTER — Other Ambulatory Visit: Payer: Self-pay | Admitting: Endocrinology

## 2013-08-05 ENCOUNTER — Telehealth: Payer: Self-pay | Admitting: Internal Medicine

## 2013-08-05 MED ORDER — LOSARTAN POTASSIUM-HCTZ 100-25 MG PO TABS: ORAL_TABLET | ORAL | Status: DC

## 2013-08-05 NOTE — Telephone Encounter (Signed)
Pt request refill for Losartan. Please advise.

## 2013-08-25 LAB — HM DIABETES EYE EXAM

## 2013-08-31 ENCOUNTER — Encounter: Payer: Self-pay | Admitting: Internal Medicine

## 2013-09-21 ENCOUNTER — Other Ambulatory Visit: Payer: Self-pay | Admitting: *Deleted

## 2013-09-21 MED ORDER — ATORVASTATIN CALCIUM 20 MG PO TABS: 20.0000 mg | ORAL_TABLET | Freq: Every day | ORAL | Status: DC

## 2013-09-21 MED ORDER — METFORMIN HCL 500 MG PO TABS: 500.0000 mg | ORAL_TABLET | Freq: Two times a day (BID) | ORAL | Status: DC

## 2013-09-21 MED ORDER — CITALOPRAM HYDROBROMIDE 40 MG PO TABS: ORAL_TABLET | ORAL | Status: DC

## 2013-09-22 ENCOUNTER — Ambulatory Visit: Payer: 59 | Admitting: Internal Medicine

## 2013-09-23 ENCOUNTER — Other Ambulatory Visit: Payer: Self-pay | Admitting: *Deleted

## 2013-09-23 MED ORDER — METFORMIN HCL 500 MG PO TABS: 500.0000 mg | ORAL_TABLET | Freq: Two times a day (BID) | ORAL | Status: DC

## 2013-09-23 MED ORDER — ATORVASTATIN CALCIUM 20 MG PO TABS: 20.0000 mg | ORAL_TABLET | Freq: Every day | ORAL | Status: DC

## 2013-12-21 ENCOUNTER — Telehealth: Payer: Self-pay | Admitting: *Deleted

## 2013-12-21 ENCOUNTER — Encounter: Payer: Self-pay | Admitting: Gynecology

## 2013-12-21 DIAGNOSIS — E119 Type 2 diabetes mellitus without complications: Secondary | ICD-10-CM

## 2013-12-21 NOTE — Telephone Encounter (Signed)
Patient has an appointment a1c bmet ordered Diabetic bundle

## 2013-12-23 ENCOUNTER — Encounter: Payer: Self-pay | Admitting: Gynecology

## 2014-01-17 ENCOUNTER — Other Ambulatory Visit (INDEPENDENT_AMBULATORY_CARE_PROVIDER_SITE_OTHER): Payer: 59

## 2014-01-17 DIAGNOSIS — E119 Type 2 diabetes mellitus without complications: Secondary | ICD-10-CM

## 2014-01-17 LAB — BASIC METABOLIC PANEL
BUN: 13 mg/dL (ref 6–23)
CALCIUM: 9.3 mg/dL (ref 8.4–10.5)
CHLORIDE: 104 meq/L (ref 96–112)
CO2: 29 mEq/L (ref 19–32)
CREATININE: 0.6 mg/dL (ref 0.4–1.2)
GFR: 114.32 mL/min (ref >60.00)
Glucose, Bld: 118 mg/dL — ABNORMAL HIGH (ref 70–99)
Potassium: 3.8 mEq/L (ref 3.5–5.1)
Sodium: 142 mEq/L (ref 135–145)

## 2014-01-17 LAB — HEMOGLOBIN A1C: HEMOGLOBIN A1C: 6.8 % — AB (ref 4.6–6.5)

## 2014-01-23 ENCOUNTER — Encounter: Payer: Self-pay | Admitting: Internal Medicine

## 2014-01-23 ENCOUNTER — Telehealth: Payer: Self-pay

## 2014-01-23 ENCOUNTER — Ambulatory Visit (INDEPENDENT_AMBULATORY_CARE_PROVIDER_SITE_OTHER): Payer: 59 | Admitting: Internal Medicine

## 2014-01-23 VITALS — BP 140/72 | HR 69 | Temp 98.5°F | Ht 63.5 in | Wt 243.4 lb

## 2014-01-23 DIAGNOSIS — M1712 Unilateral primary osteoarthritis, left knee: Secondary | ICD-10-CM | POA: Insufficient documentation

## 2014-01-23 DIAGNOSIS — E119 Type 2 diabetes mellitus without complications: Secondary | ICD-10-CM

## 2014-01-23 DIAGNOSIS — I10 Essential (primary) hypertension: Secondary | ICD-10-CM

## 2014-01-23 DIAGNOSIS — Z0000 Encounter for general adult medical examination without abnormal findings: Secondary | ICD-10-CM

## 2014-01-23 DIAGNOSIS — M1710 Unilateral primary osteoarthritis, unspecified knee: Secondary | ICD-10-CM

## 2014-01-23 NOTE — Progress Notes (Signed)
Subjective:    Patient ID: Katherine Lawrence, female    DOB: 1955-08-19, 58 y.o.   MRN: [id number]  HPI  patient is here today for annual physical. Patient feels well overall - needs surgical clearance for upcoming ortho procedure - L TKR  Also reviewed chronic medical issues and interval medical events  Past Medical History  Diagnosis Date  . Diabetes type 2, controlled   . HYPERCHOLESTEROLEMIA   . DEPRESSION   . HYPERTENSION   . 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 Osteoarthritis   . Postsurgical menopause   . Ovarian cyst    Family History  Problem Relation Age of Onset  . Pancreatic cancer Mother   . Hypertension Father   . Heart disease Father    History  Substance Use Topics  . Smoking status: Former Smoker    Quit date: 07/08/2010  . Smokeless tobacco: Never Used  . Alcohol Use: No    Review of Systems  Constitutional: Negative for fatigue and unexpected weight change.  Respiratory: Negative for cough, shortness of breath and wheezing.   Cardiovascular: Negative for chest pain, palpitations and leg swelling.  Gastrointestinal: Negative for nausea, abdominal pain and diarrhea.  Musculoskeletal: Positive for arthralgias (L knee) and gait problem.  Neurological: Negative for dizziness, weakness, light-headedness and headaches.  Psychiatric/Behavioral: Negative for dysphoric mood. The patient is not nervous/anxious.   All other systems reviewed and are negative.      Objective:   Physical Exam  BP 140/72  Pulse 69  Temp(Src) 98.5 F (36.9 C) (Oral)  Ht 5' 3.5" (1.613 m)  Wt 243 lb 6 oz (110.394 kg)  BMI 42.43 kg/m2  SpO2 93%  LMP 07/07/2011 Wt Readings from Last 3 Encounters:  01/23/14 243 lb 6 oz (110.394 kg)  06/22/13 261 lb 6.4 oz (118.57 kg)  05/04/13 258 lb 3.2 oz (117.119 kg)   Constitutional: She is obese, and appears well-developed and well-nourished. No distress.  HENT: Head: Normocephalic and  atraumatic. Ears: B TMs ok, no erythema or effusion; Nose: Nose normal. Mouth/Throat: Oropharynx is clear and moist. No oropharyngeal exudate.  Eyes: Conjunctivae and EOM are normal. Pupils are equal, round, and reactive to light. No scleral icterus.  Neck: Normal range of motion. Neck supple. No JVD present. No thyromegaly present.  Cardiovascular: Normal rate, regular rhythm and normal heart sounds.  No murmur heard. No BLE edema. Pulmonary/Chest: Effort normal and breath sounds normal. No respiratory distress. She has no wheezes.  Abdominal: Soft. Bowel sounds are normal. She exhibits no distension. There is no tenderness. no masses GU/breast: defer to gyn Musculoskeletal: L knee - boggy synovitis - tender to palpation over joint line; FROM and ligamentous function intact Neurological: She is alert and oriented to person, place, and time. No cranial nerve deficit. Coordination, balance, strength, speech and gait are normal.  Skin: Skin is warm and dry. No rash noted. No erythema.  Psychiatric: She has a normal mood and affect. Her behavior is normal. Judgment and thought content normal.    Lab Results  Component Value Date   WBC 9.0 11/16/2012   HGB 15.1* 11/16/2012   HCT 45.6 11/16/2012   PLT 219.0 11/16/2012   GLUCOSE 118* 01/17/2014   CHOL 200 06/22/2013   TRIG 115.0 06/22/2013   HDL 37.90* 06/22/2013   LDLDIRECT 157.6 11/16/2012   LDLCALC 139* 06/22/2013   ALT 18 06/22/2013   AST 20 06/22/2013   NA 142  01/17/2014   K 3.8 01/17/2014   CL 104 01/17/2014   CREATININE 0.6 01/17/2014   BUN 13 01/17/2014   CO2 29 01/17/2014   TSH 2.08 11/16/2012   HGBA1C 6.8* 01/17/2014   MICROALBUR 11.4* 11/16/2012    No results found.  ECG today: compared to 12/2011: incomplete RBBB (unchanged)- sinus @ 68 bpm      Assessment & Plan:   CPX/v70.0 - Patient has been counseled on age-appropriate routine health concerns for screening and prevention. These are reviewed and up-to-date. Immunizations are up-to-date or declined. Labs  and ECG reviewed.  This patient has been evaluated and it is felt that the surgical risk is low and outweighed by the potential benefit of the surgery. Therefore, medically clear to proceed when scheduling allows.  Problem List Items Addressed This Visit   Diabetes type 2, controlled      Diet controlled since 2010 (or longer) Check a1c semi annually and prn The patient is asked to make continued efforts to improve diet and exercise patterns to aid in medical management of this problem.  Lab Results  Component Value Date   HGBA1C 6.8* 01/17/2014      Relevant Medications      rosuvastatin (CRESTOR) 20 MG tablet   HYPERTENSION      BP Readings from Last 3 Encounters:  01/23/14 140/72  06/22/13 130/92  05/04/13 138/78   The current medical regimen is effective;  continue present plan and medications.      Relevant Medications      rosuvastatin (CRESTOR) 20 MG tablet   Other Relevant Orders      EKG 12-Lead   Osteoarthritis of left knee    Other Visit Diagnoses   Routine general medical examination at a health care facility    -  Primary

## 2014-01-23 NOTE — Patient Instructions (Addendum)
It was good to see you today.  We have reviewed your prior records including labs and tests today  Health Maintenance reviewed - all recommended immunizations and age-appropriate screenings are up-to-date.  Medications/recent labs reviewed and updated, no changes recommended at this time.  You have been evaluated tody for surgery clearance - it is felt that the surgical risk is low and outweighed by the potential benefit of the surgery. Therefore, medically clear to proceed when scheduling allows. - Will send information to Dr Durward Fortes reflecting same  Please schedule followup in 6 months for semiannual diabetes mellitus exam and labs, call sooner if problems.  Health Maintenance Adopting a healthy lifestyle and getting preventive care can go a long way to promote health and wellness. Talk with your health care provider about what schedule of regular examinations is right for you. This is a good chance for you to check in with your provider about disease prevention and staying healthy. In between checkups, there are plenty of things you can do on your own. Experts have done a lot of research about which lifestyle changes and preventive measures are most likely to keep you healthy. Ask your health care provider for more information. WEIGHT AND DIET  Eat a healthy diet  Be sure to include plenty of vegetables, fruits, low-fat dairy products, and lean protein.  Do not eat a lot of foods high in solid fats, added sugars, or salt.  Get regular exercise. This is one of the most important things you can do for your health.  Most adults should exercise for at least 150 minutes each week. The exercise should increase your heart rate and make you sweat (moderate-intensity exercise).  Most adults should also do strengthening exercises at least twice a week. This is in addition to the moderate-intensity exercise.  Maintain a healthy weight  Body mass index (BMI) is a measurement that can be used  to identify possible weight problems. It estimates body fat based on height and weight. Your health care provider can help determine your BMI and help you achieve or maintain a healthy weight.  For females 16 years of age and older:   A BMI below 18.5 is considered underweight.  A BMI of 18.5 to 24.9 is normal.  A BMI of 25 to 29.9 is considered overweight.  A BMI of 30 and above is considered obese.  Watch levels of cholesterol and blood lipids  You should start having your blood tested for lipids and cholesterol at 56 years of age, then have this test every 5 years.  You may need to have your cholesterol levels checked more often if:  Your lipid or cholesterol levels are high.  You are older than 56 years of age.  You are at high risk for heart disease.  CANCER SCREENING   Lung Cancer  Lung cancer screening is recommended for adults 49-66 years old who are at high risk for lung cancer because of a history of smoking.  A yearly low-dose CT scan of the lungs is recommended for people who:  Currently smoke.  Have quit within the past 15 years.  Have at least a 30-pack-year history of smoking. A pack year is smoking an average of one pack of cigarettes a day for 1 year.  Yearly screening should continue until it has been 15 years since you quit.  Yearly screening should stop if you develop a health problem that would prevent you from having lung cancer treatment.  Breast Cancer  Practice breast  self-awareness. This means understanding how your breasts normally appear and feel.  It also means doing regular breast self-exams. Let your health care provider know about any changes, no matter how small.  If you are in your 20s or 30s, you should have a clinical breast exam (CBE) by a health care provider every 1-3 years as part of a regular health exam.  If you are 65 or older, have a CBE every year. Also consider having a breast X-ray (mammogram) every year.  If you  have a family history of breast cancer, talk to your health care provider about genetic screening.  If you are at high risk for breast cancer, talk to your health care provider about having an MRI and a mammogram every year.  Breast cancer gene (BRCA) assessment is recommended for women who have family members with BRCA-related cancers. BRCA-related cancers include:  Breast.  Ovarian.  Tubal.  Peritoneal cancers.  Results of the assessment will determine the need for genetic counseling and BRCA1 and BRCA2 testing. Cervical Cancer Routine pelvic examinations to screen for cervical cancer are no longer recommended for nonpregnant women who are considered low risk for cancer of the pelvic organs (ovaries, uterus, and vagina) and who do not have symptoms. A pelvic examination may be necessary if you have symptoms including those associated with pelvic infections. Ask your health care provider if a screening pelvic exam is right for you.   The Pap test is the screening test for cervical cancer for women who are considered at risk.  If you had a hysterectomy for a problem that was not cancer or a condition that could lead to cancer, then you no longer need Pap tests.  If you are older than 65 years, and you have had normal Pap tests for the past 10 years, you no longer need to have Pap tests.  If you have had past treatment for cervical cancer or a condition that could lead to cancer, you need Pap tests and screening for cancer for at least 20 years after your treatment.  If you no longer get a Pap test, assess your risk factors if they change (such as having a new sexual partner). This can affect whether you should start being screened again.  Some women have medical problems that increase their chance of getting cervical cancer. If this is the case for you, your health care provider may recommend more frequent screening and Pap tests.  The human papillomavirus (HPV) test is another test  that may be used for cervical cancer screening. The HPV test looks for the virus that can cause cell changes in the cervix. The cells collected during the Pap test can be tested for HPV.  The HPV test can be used to screen women 27 years of age and older. Getting tested for HPV can extend the interval between normal Pap tests from three to five years.  An HPV test also should be used to screen women of any age who have unclear Pap test results.  After 56 years of age, women should have HPV testing as often as Pap tests.  Colorectal Cancer  This type of cancer can be detected and often prevented.  Routine colorectal cancer screening usually begins at 56 years of age and continues through 56 years of age.  Your health care provider may recommend screening at an earlier age if you have risk factors for colon cancer.  Your health care provider may also recommend using home test kits to check  for hidden blood in the stool.  A small camera at the end of a tube can be used to examine your colon directly (sigmoidoscopy or colonoscopy). This is done to check for the earliest forms of colorectal cancer.  Routine screening usually begins at age 54.  Direct examination of the colon should be repeated every 5-10 years through 56 years of age. However, you may need to be screened more often if early forms of precancerous polyps or small growths are found. Skin Cancer  Check your skin from head to toe regularly.  Tell your health care provider about any new moles or changes in moles, especially if there is a change in a mole's shape or color.  Also tell your health care provider if you have a mole that is larger than the size of a pencil eraser.  Always use sunscreen. Apply sunscreen liberally and repeatedly throughout the day.  Protect yourself by wearing long sleeves, pants, a wide-brimmed hat, and sunglasses whenever you are outside. HEART DISEASE, DIABETES, AND HIGH BLOOD PRESSURE   Have  your blood pressure checked at least every 1-2 years. High blood pressure causes heart disease and increases the risk of stroke.  If you are between 67 years and 60 years old, ask your health care provider if you should take aspirin to prevent strokes.  Have regular diabetes screenings. This involves taking a blood sample to check your fasting blood sugar level.  If you are at a normal weight and have a low risk for diabetes, have this test once every three years after 56 years of age.  If you are overweight and have a high risk for diabetes, consider being tested at a younger age or more often. PREVENTING INFECTION  Hepatitis B  If you have a higher risk for hepatitis B, you should be screened for this virus. You are considered at high risk for hepatitis B if:  You were born in a country where hepatitis B is common. Ask your health care provider which countries are considered high risk.  Your parents were born in a high-risk country, and you have not been immunized against hepatitis B (hepatitis B vaccine).  You have HIV or AIDS.  You use needles to inject street drugs.  You live with someone who has hepatitis B.  You have had sex with someone who has hepatitis B.  You get hemodialysis treatment.  You take certain medicines for conditions, including cancer, organ transplantation, and autoimmune conditions. Hepatitis C  Blood testing is recommended for:  Everyone born from [birth year] through 1963.  Anyone with known risk factors for hepatitis C. Sexually transmitted infections (STIs)  You should be screened for sexually transmitted infections (STIs) including gonorrhea and chlamydia if:  You are sexually active and are younger than 56 years of age.  You are older than 56 years of age and your health care provider tells you that you are at risk for this type of infection.  Your sexual activity has changed since you were last screened and you are at an increased risk for chlamydia  or gonorrhea. Ask your health care provider if you are at risk.  If you do not have HIV, but are at risk, it may be recommended that you take a prescription medicine daily to prevent HIV infection. This is called pre-exposure prophylaxis (PrEP). You are considered at risk if:  You are sexually active and do not regularly use condoms or know the HIV status of your partner(s).  You take drugs  by injection.  You are sexually active with a partner who has HIV. Talk with your health care provider about whether you are at high risk of being infected with HIV. If you choose to begin PrEP, you should first be tested for HIV. You should then be tested every 3 months for as long as you are taking PrEP.  PREGNANCY   If you are premenopausal and you may become pregnant, ask your health care provider about preconception counseling.  If you may become pregnant, take 400 to 800 micrograms (mcg) of folic acid every day.  If you want to prevent pregnancy, talk to your health care provider about birth control (contraception). OSTEOPOROSIS AND MENOPAUSE   Osteoporosis is a disease in which the bones lose minerals and strength with aging. This can result in serious bone fractures. Your risk for osteoporosis can be identified using a bone density scan.  If you are 33 years of age or older, or if you are at risk for osteoporosis and fractures, ask your health care provider if you should be screened.  Ask your health care provider whether you should take a calcium or vitamin D supplement to lower your risk for osteoporosis.  Menopause may have certain physical symptoms and risks.  Hormone replacement therapy may reduce some of these symptoms and risks. Talk to your health care provider about whether hormone replacement therapy is right for you.  HOME CARE INSTRUCTIONS   Schedule regular health, dental, and eye exams.  Stay current with your immunizations.   Do not use any tobacco products including  cigarettes, chewing tobacco, or electronic cigarettes.  If you are pregnant, do not drink alcohol.  If you are breastfeeding, limit how much and how often you drink alcohol.  Limit alcohol intake to no more than 1 drink per day for nonpregnant women. One drink equals 12 ounces of beer, 5 ounces of wine, or 1 ounces of hard liquor.  Do not use street drugs.  Do not share needles.  Ask your health care provider for help if you need support or information about quitting drugs.  Tell your health care provider if you often feel depressed.  Tell your health care provider if you have ever been abused or do not feel safe at home. Document Released: 12/16/2010 Document Revised: 10/17/2013 Document Reviewed: 05/04/2013 [facility] Patient Information 2015 [place], [place]. This information is not intended to replace advice given to you by your health care provider. Make sure you discuss any questions you have with your health care provider. Diabetes and Standards of Medical Care Diabetes is complicated. You may find that your diabetes team includes a dietitian, nurse, diabetes educator, eye doctor, and more. To help everyone know what is going on and to help you get the care you deserve, the following schedule of care was developed to help keep you on track. Below are the tests, exams, vaccines, medicines, education, and plans you will need. HbA1c test This test shows how well you have controlled your glucose over the past 2-3 months. It is used to see if your diabetes management plan needs to be adjusted.   It is performed at least 2 times a year if you are meeting treatment goals.  It is performed 4 times a year if therapy has changed or if you are not meeting treatment goals. Blood pressure test  This test is performed at every routine medical visit. The goal is less than 140/90 mm Hg for most people, but 130/80 mm Hg in some  cases. Ask your health care provider about your goal. Dental  exam  Follow up with the dentist regularly. Eye exam  If you are diagnosed with type 1 diabetes as a child, get an exam upon reaching the age of 25 years or older and have had diabetes for 3-5 years. Yearly eye exams are recommended after that initial eye exam.  If you are diagnosed with type 1 diabetes as an adult, get an exam within 5 years of diagnosis and then yearly.  If you are diagnosed with type 2 diabetes, get an exam as soon as possible after the diagnosis and then yearly. Foot care exam  Visual foot exams are performed at every routine medical visit. The exams check for cuts, injuries, or other problems with the feet.  A comprehensive foot exam should be done yearly. This includes visual inspection as well as assessing foot pulses and testing for loss of sensation.  Check your feet nightly for cuts, injuries, or other problems with your feet. Tell your health care provider if anything is not healing. Kidney function test (urine microalbumin)  This test is performed once a year.  Type 1 diabetes: The first test is performed 5 years after diagnosis.  Type 2 diabetes: The first test is performed at the time of diagnosis.  A serum creatinine and estimated glomerular filtration rate (eGFR) test is done once a year to assess the level of chronic kidney disease (CKD), if present. Lipid profile (cholesterol, HDL, LDL, triglycerides)  Performed every 5 years for most people.  The goal for LDL is less than 100 mg/dL. If you are at high risk, the goal is less than 70 mg/dL.  The goal for HDL is 40 mg/dL-50 mg/dL for men and 50 mg/dL-60 mg/dL for women. An HDL cholesterol of 60 mg/dL or higher gives some protection against heart disease.  The goal for triglycerides is less than 150 mg/dL. Influenza vaccine, pneumococcal vaccine, and hepatitis B vaccine  The influenza vaccine is recommended yearly.  It is recommended that people with diabetes who are over 21 years old get the  pneumonia vaccine. In some cases, two separate shots may be given. Ask your health care provider if your pneumonia vaccination is up to date.  The hepatitis B vaccine is also recommended for adults with diabetes. Diabetes self-management education  Education is recommended at diagnosis and ongoing as needed. Treatment plan  Your treatment plan is reviewed at every medical visit. Document Released: 03/30/2009 Document Revised: 10/17/2013 Document Reviewed: 11/02/2012 [facility] Patient Information 2015 [place], [place]. This information is not intended to replace advice given to you by your health care provider. Make sure you discuss any questions you have with your health care provider.

## 2014-01-23 NOTE — Progress Notes (Signed)
Pre visit review using our clinic review tool, if applicable. No additional management support is needed unless otherwise documented below in the visit note. 

## 2014-01-23 NOTE — Assessment & Plan Note (Signed)
Diet controlled since 2010 (or longer) Check a1c semi annually and prn The patient is asked to make continued efforts to improve diet and exercise patterns to aid in medical management of this problem.  Lab Results  Component Value Date   HGBA1C 6.8* 01/17/2014

## 2014-01-23 NOTE — Telephone Encounter (Signed)
PCP sign sx release forms.   Pt informed of same.

## 2014-01-23 NOTE — Assessment & Plan Note (Signed)
BP Readings from Last 3 Encounters:  01/23/14 140/72  06/22/13 130/92  05/04/13 138/78   The current medical regimen is effective;  continue present plan and medications.

## 2014-01-24 ENCOUNTER — Telehealth: Payer: Self-pay | Admitting: Internal Medicine

## 2014-01-24 NOTE — Telephone Encounter (Signed)
Relevant patient education assigned to patient using Emmi. ° °

## 2014-02-03 ENCOUNTER — Encounter (HOSPITAL_COMMUNITY): Payer: Self-pay | Admitting: Pharmacy Technician

## 2014-02-07 ENCOUNTER — Encounter (HOSPITAL_COMMUNITY): Payer: Self-pay

## 2014-02-07 ENCOUNTER — Encounter (HOSPITAL_COMMUNITY)
Admission: RE | Admit: 2014-02-07 | Discharge: 2014-02-07 | Disposition: A | Discharge status: Home / Self Care | Payer: 59 | Source: Ambulatory Visit | Attending: Orthopaedic Surgery | Admitting: Orthopaedic Surgery

## 2014-02-07 ENCOUNTER — Encounter (HOSPITAL_COMMUNITY)
Admission: RE | Admit: 2014-02-07 | Discharge: 2014-02-07 | Disposition: A | Discharge status: Home / Self Care | Payer: 59 | Source: Ambulatory Visit | Attending: Orthopedic Surgery | Admitting: Orthopedic Surgery

## 2014-02-07 DIAGNOSIS — Z01818 Encounter for other preprocedural examination: Secondary | ICD-10-CM | POA: Diagnosis present

## 2014-02-07 DIAGNOSIS — M1710 Unilateral primary osteoarthritis, unspecified knee: Secondary | ICD-10-CM | POA: Insufficient documentation

## 2014-02-07 HISTORY — DX: Cardiac murmur, unspecified: R01.1

## 2014-02-07 HISTORY — DX: Pneumonia, unspecified organism: J18.9

## 2014-02-07 LAB — COMPREHENSIVE METABOLIC PANEL
ALK PHOS: 61 U/L (ref 39–117)
ALT: 11 U/L (ref 0–35)
AST: 13 U/L (ref 0–37)
Albumin: 4.1 g/dL (ref 3.5–5.2)
Anion gap: 14 (ref 5–15)
BUN: 15 mg/dL (ref 6–23)
CO2: 25 meq/L (ref 19–32)
Calcium: 9.8 mg/dL (ref 8.4–10.5)
Chloride: 101 mEq/L (ref 96–112)
Creatinine, Ser: 0.66 mg/dL (ref 0.50–1.10)
GFR calc Af Amer: >90 mL/min (ref >90)
GLUCOSE: 103 mg/dL — AB (ref 70–99)
Potassium: 3.7 mEq/L (ref 3.7–5.3)
SODIUM: 140 meq/L (ref 137–147)
Total Bilirubin: 0.4 mg/dL (ref 0.3–1.2)
Total Protein: 7.1 g/dL (ref 6.0–8.3)

## 2014-02-07 LAB — CBC WITH DIFFERENTIAL/PLATELET
BASOS ABS: 0 10*3/uL (ref 0.0–0.1)
Basophils Relative: 0 % (ref 0–1)
Eosinophils Absolute: 0.3 10*3/uL (ref 0.0–0.7)
Eosinophils Relative: 3 % (ref 0–5)
HCT: 43.9 % (ref 36.0–46.0)
Hemoglobin: 14.6 g/dL (ref 12.0–15.0)
LYMPHS ABS: 2.4 10*3/uL (ref 0.7–4.0)
LYMPHS PCT: 26 % (ref 12–46)
MCH: 29.4 pg (ref 26.0–34.0)
MCHC: 33.3 g/dL (ref 30.0–36.0)
MCV: 88.3 fL (ref 78.0–100.0)
Monocytes Absolute: 0.4 10*3/uL (ref 0.1–1.0)
Monocytes Relative: 5 % (ref 3–12)
NEUTROS ABS: 6 10*3/uL (ref 1.7–7.7)
NEUTROS PCT: 66 % (ref 43–77)
PLATELETS: 224 10*3/uL (ref 150–400)
RBC: 4.97 MIL/uL (ref 3.87–5.11)
RDW: 13.5 % (ref 11.5–15.5)
WBC: 9.1 10*3/uL (ref 4.0–10.5)

## 2014-02-07 LAB — TYPE AND SCREEN
ABO/RH(D): B POS
Antibody Screen: NEGATIVE

## 2014-02-07 LAB — SURGICAL PCR SCREEN
MRSA, PCR: NEGATIVE
STAPHYLOCOCCUS AUREUS: NEGATIVE

## 2014-02-07 LAB — PROTIME-INR
INR: 1.01 (ref 0.00–1.49)
Prothrombin Time: 13.3 seconds (ref 11.6–15.2)

## 2014-02-07 LAB — APTT: APTT: 24 s (ref 24–37)

## 2014-02-07 MED ORDER — CHLORHEXIDINE GLUCONATE 4 % EX LIQD: 60.0000 mL | Freq: Every day | CUTANEOUS | Status: DC

## 2014-02-07 NOTE — Pre-Procedure Instructions (Signed)
NEA GITTENS  02/07/2014   Your procedure is scheduled on:  Tuesday September 1 at 0730 AM  Report to [facility] Admitting at 0530 AM.  Call this number if you have problems the morning of surgery: [phone]   Remember:   Do not eat food or drink liquids after midnight Monday   Take these medicines the morning of surgery with A SIP OF WATER: Tramadol if needed for pain Stop Aspirin, Vitamins, Herbal meds, Nsaids 5 days prior to surgery   Do not wear jewelry, make-up or nail polish.  Do not wear lotions, powders, or perfumes. You may wear deodorant.  Do not shave 48 hours prior to surgery.  Do not bring valuables to the hospital.  [facility] is not responsible  for any belongings or valuables.               Contacts, dentures or bridgework may not be worn into surgery.  Leave suitcase in the car. After surgery it may be brought to your room.  For patients admitted to the hospital, discharge time is determined by your   treatment team.               Patients discharged the day of surgery will not be allowed to drive home.   Special Instructions: [facility] - Preparing for Surgery  Before surgery, you can play an important role.  Because skin is not sterile, your skin needs to be as free of germs as possible.  You can reduce the number of germs on you skin by washing with CHG (chlorahexidine gluconate) soap before surgery.  CHG is an antiseptic cleaner which kills germs and bonds with the skin to continue killing germs even after washing.  Please DO NOT use if you have an allergy to CHG or antibacterial soaps.  If your skin becomes reddened/irritated stop using the CHG and inform your nurse when you arrive at Short Stay.  Do not shave (including legs and underarms) for at least 48 hours prior to the first CHG shower.  You may shave your face.  Please follow these instructions carefully:   1.  Shower with CHG Soap the night before surgery and the                                 morning of Surgery.  2.  If you choose to wash your hair, wash your hair first as usual with your       normal shampoo.  3.  After you shampoo, rinse your hair and body thoroughly to remove the                      Shampoo.  4.  Use CHG as you would any other liquid soap.  You can apply chg directly       to the skin and wash gently with scrungie or a clean washcloth.  5.  Apply the CHG Soap to your body ONLY FROM THE NECK DOWN.        Do not use on open wounds or open sores.  Avoid contact with your eyes,       ears, mouth and genitals (private parts).  Wash genitals (private parts)       with your normal soap.  6.  Wash thoroughly, paying special attention to the area where your surgery        will be  performed.  7.  Thoroughly rinse your body with warm water from the neck down.  8.  DO NOT shower/wash with your normal soap after using and rinsing off       the CHG Soap.  9.  Pat yourself dry with a clean towel.            10.  Wear clean pajamas.            11.  Place clean sheets on your bed the night of your first shower and do not        sleep with pets.  Day of Surgery  Do not apply any lotions/deoderants the morning of surgery.  Please wear clean clothes to the hospital/surgery center.      Please read over the following fact sheets that you were given: Pain Booklet, Coughing and Deep Breathing, Blood Transfusion Information, MRSA Information and Surgical Site Infection Prevention

## 2014-02-09 NOTE — H&P (Signed)
CHIEF COMPLAINT:  Painful left knee.    HISTORY OF PRESENT ILLNESS:  Katherine Lawrence is seen today for evaluation of her left knee.  She had been seen previously in 2013 with x-rays that revealed an end-stage osteoarthritis of the left knee.  She has been significantly compromised with her activities, because of this.  She initially had been using a cane and now she is actually into a walker.  She has difficulty with sleeping at nighttime and has tried multiple anti-inflammatories.  She has had a right total hip in the past and is starting to have some mild degenerative changes in the left hip.  The left hip had been injected by Dr. Ernestina Patches with a short time of relief, however, she continues to have pain and discomfort in the left knee to the point where it has affected her activities of daily living.  She cannot sleep.  She is quite uncomfortable and has to use assisted devices to ambulate.  She returns today for re-evaluation.     PAST SURGICAL HISTORY/HOSPITALIZATION:   1.  In 2008 right total hip arthroplasty.  2.  In 2013 laparoscopic robotic hysterectomy.  3.  In 1986 Cesarian section.   4.  In 2005 pneumonia.     CURRENT MEDICATIONS:  1.  Citalopram 20 mg daily. 2.  Losartan 1 tablet daily.  3.  Atorvastatin 1 daily,  4.  Metformin 500 mg b.i.d.  5.  Tramadol 50 mg at bedtime.   6.  Methocarbamol one 500 mg at bedtime.    ALLERGIES:  None known.   REVIEW OF SYSTEMS:  A 14 point review of systems is positive with occasional dyspnea on exertion, more with increased weight.  She has lost weight recently.  She has had hypertension, which she states forever, and she is on medication for this, the losartan.  She was told that she had a heart murmur at age 53.  She did have palpitations at one point, but none recently.  She has had diabetes since 2008 and most recently in January was placed on metformin.  She has had depression also.  She does use citalopram.     FAMILY HISTORY:  Positive for her  mother who is deceased at age 1 from pancreatic cancer.  Her father, who died at age 29 from myocardial infarction and did have heart disease, hypertension and strokes.     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  She does occasionally smoke, but on rare options.  She was just an intermittent on and off light smoker.  She does not use alcohol.      PHYSICAL EXAMINATION:  The exam today reveals a 58 year old white female, well developed, well nourished, alert, pleasant and cooperative in moderate distress secondary to left leg pain.     Her height is 5 feet 3 inches, weight 242 pounds, BMI 42.9.   Vital signs reveal a temperature of 98.2, pulse 72, respiration rate 24, blood pressure 150/90.   Head is normocephalic.   Eyes:  Pupils equal, round and reactive to light and accommodation with extraocular movements are intact.  Neck:  Supple, no carotid bruits were noted. Chest had good expansion. Lungs were essentially clear.  Cardiac had a regular rhythm and rate with normal S1 and S2 and no particular rub or rales were noted.  Markedly distant heart sounds.  No murmurs were noted.   CNS:  She is oriented x3 and cranial nerves II-XII are grossly intact.  Abdomen:  Scaphoid, soft, nontender, no masses palpable and normal bowel sounds present.   Genital, rectal and breast exam are not indicated for an orthopedic evaluation.  Musculoskeletal:  She has good motion of both hips, though certainly is quite decreased on the left.  She has range of motion from about 20 degrees to 90 degrees.  She has crepitus with range of motion.  She has a 1+ effusion.  The calf is supple and nontender.     RADIOGRAPHS:  X-rays reveal end-stage OA of the left knee.    CLINICAL IMPRESSION:   1.  End-stage OA of the left knee.  2.  History of hypertension. 3.  History of diabetes.  4.  History of cigarette smoking.    RECOMMENDATIONS:  At this time I have reviewed correspondence  from Dr. Gwendolyn Grant, which reveals that she is considered clear from both a cardiac and medical standpoint.  An EKG was also given.  I have reviewed the medical records that were sent to [place].     At this time our recommendation is that we consider continuing with a left total knee arthroplasty in the very near future.  The procedure, risks and benefits were fully explained to her at this time and she is understanding.  We will have her admitted to the hospital and we can follow her when she is postoperative.    Mike Craze [place], [place] [phone]  02/09/2014 1:03 PM

## 2014-02-13 MED ORDER — CEFAZOLIN SODIUM-DEXTROSE 2-3 GM-% IV SOLR
2.0000 g | INTRAVENOUS | Status: AC
  Administered 2014-02-14: 2 g via INTRAVENOUS
  Filled 2014-02-13: qty 50

## 2014-02-14 ENCOUNTER — Inpatient Hospital Stay (HOSPITAL_COMMUNITY): Payer: 59

## 2014-02-14 ENCOUNTER — Encounter (HOSPITAL_COMMUNITY)
Admission: RE | Disposition: A | Discharge status: Skilled Nursing Facility | Payer: Self-pay | Source: Ambulatory Visit | Attending: Orthopaedic Surgery

## 2014-02-14 ENCOUNTER — Encounter (HOSPITAL_COMMUNITY): Payer: 59 | Admitting: Anesthesiology

## 2014-02-14 ENCOUNTER — Inpatient Hospital Stay (HOSPITAL_COMMUNITY): Payer: 59 | Admitting: Anesthesiology

## 2014-02-14 ENCOUNTER — Inpatient Hospital Stay (HOSPITAL_COMMUNITY)
Admission: RE | Admit: 2014-02-14 | Discharge: 2014-02-16 | DRG: 470 | Disposition: A | Discharge status: Skilled Nursing Facility | Payer: 59 | Source: Ambulatory Visit | Attending: Orthopaedic Surgery | Admitting: Orthopaedic Surgery

## 2014-02-14 DIAGNOSIS — Z6841 Body Mass Index (BMI) 40.0 and over, adult: Secondary | ICD-10-CM | POA: Diagnosis not present

## 2014-02-14 DIAGNOSIS — Z96659 Presence of unspecified artificial knee joint: Secondary | ICD-10-CM

## 2014-02-14 DIAGNOSIS — Z79899 Other long term (current) drug therapy: Secondary | ICD-10-CM

## 2014-02-14 DIAGNOSIS — M1710 Unilateral primary osteoarthritis, unspecified knee: Principal | ICD-10-CM | POA: Diagnosis present

## 2014-02-14 DIAGNOSIS — J45909 Unspecified asthma, uncomplicated: Secondary | ICD-10-CM | POA: Diagnosis present

## 2014-02-14 DIAGNOSIS — I959 Hypotension, unspecified: Secondary | ICD-10-CM | POA: Diagnosis present

## 2014-02-14 DIAGNOSIS — F3289 Other specified depressive episodes: Secondary | ICD-10-CM | POA: Diagnosis present

## 2014-02-14 DIAGNOSIS — M1712 Unilateral primary osteoarthritis, left knee: Secondary | ICD-10-CM

## 2014-02-14 DIAGNOSIS — Z823 Family history of stroke: Secondary | ICD-10-CM

## 2014-02-14 DIAGNOSIS — E7800 Pure hypercholesterolemia, unspecified: Secondary | ICD-10-CM | POA: Diagnosis present

## 2014-02-14 DIAGNOSIS — R339 Retention of urine, unspecified: Secondary | ICD-10-CM | POA: Diagnosis not present

## 2014-02-14 DIAGNOSIS — F329 Major depressive disorder, single episode, unspecified: Secondary | ICD-10-CM | POA: Diagnosis present

## 2014-02-14 DIAGNOSIS — F17200 Nicotine dependence, unspecified, uncomplicated: Secondary | ICD-10-CM | POA: Diagnosis present

## 2014-02-14 DIAGNOSIS — I10 Essential (primary) hypertension: Secondary | ICD-10-CM | POA: Diagnosis present

## 2014-02-14 DIAGNOSIS — Z800 Family history of malignant neoplasm of digestive organs: Secondary | ICD-10-CM | POA: Diagnosis not present

## 2014-02-14 DIAGNOSIS — Z96649 Presence of unspecified artificial hip joint: Secondary | ICD-10-CM | POA: Diagnosis not present

## 2014-02-14 DIAGNOSIS — E119 Type 2 diabetes mellitus without complications: Secondary | ICD-10-CM | POA: Diagnosis present

## 2014-02-14 DIAGNOSIS — G8918 Other acute postprocedural pain: Secondary | ICD-10-CM | POA: Diagnosis not present

## 2014-02-14 DIAGNOSIS — Z8249 Family history of ischemic heart disease and other diseases of the circulatory system: Secondary | ICD-10-CM

## 2014-02-14 DIAGNOSIS — Z7901 Long term (current) use of anticoagulants: Secondary | ICD-10-CM

## 2014-02-14 DIAGNOSIS — M25569 Pain in unspecified knee: Secondary | ICD-10-CM | POA: Diagnosis present

## 2014-02-14 HISTORY — PX: TOTAL KNEE ARTHROPLASTY: SHX125

## 2014-02-14 HISTORY — DX: Presence of unspecified artificial knee joint: Z96.659

## 2014-02-14 LAB — URINALYSIS, ROUTINE W REFLEX MICROSCOPIC
BILIRUBIN URINE: NEGATIVE
GLUCOSE, UA: NEGATIVE mg/dL
HGB URINE DIPSTICK: NEGATIVE
Ketones, ur: NEGATIVE mg/dL
Leukocytes, UA: NEGATIVE
Nitrite: NEGATIVE
Protein, ur: NEGATIVE mg/dL
SPECIFIC GRAVITY, URINE: 1.019 (ref 1.005–1.030)
Urobilinogen, UA: 0.2 mg/dL (ref 0.0–1.0)
pH: 5 (ref 5.0–8.0)

## 2014-02-14 LAB — HEMOGLOBIN A1C
Hgb A1c MFr Bld: 6.7 % — ABNORMAL HIGH (ref <5.7)
Mean Plasma Glucose: 146 mg/dL — ABNORMAL HIGH (ref <117)

## 2014-02-14 LAB — GLUCOSE, CAPILLARY
GLUCOSE-CAPILLARY: 176 mg/dL — AB (ref 70–99)
Glucose-Capillary: 108 mg/dL — ABNORMAL HIGH (ref 70–99)
Glucose-Capillary: 153 mg/dL — ABNORMAL HIGH (ref 70–99)
Glucose-Capillary: 164 mg/dL — ABNORMAL HIGH (ref 70–99)

## 2014-02-14 SURGERY — ARTHROPLASTY, KNEE, TOTAL
Anesthesia: Regional | Site: Knee | Laterality: Left

## 2014-02-14 MED ORDER — CITALOPRAM HYDROBROMIDE 40 MG PO TABS
40.0000 mg | ORAL_TABLET | Freq: Every day | ORAL | Status: DC
  Administered 2014-02-14 – 2014-02-15 (×2): 40 mg via ORAL
  Filled 2014-02-14 (×4): qty 1

## 2014-02-14 MED ORDER — LIDOCAINE HCL (CARDIAC) 20 MG/ML IV SOLN
INTRAVENOUS | Status: AC
  Filled 2014-02-14: qty 5

## 2014-02-14 MED ORDER — MAGNESIUM HYDROXIDE 400 MG/5ML PO SUSP: 30.0000 mL | Freq: Every day | ORAL | Status: DC | PRN

## 2014-02-14 MED ORDER — MENTHOL 3 MG MT LOZG
1.0000 | LOZENGE | OROMUCOSAL | Status: DC | PRN
Start: 2014-02-14 — End: 2014-02-16

## 2014-02-14 MED ORDER — GLYCOPYRROLATE 0.2 MG/ML IJ SOLN
INTRAMUSCULAR | Status: DC | PRN
  Administered 2014-02-14 (×2): 0.2 mg via INTRAVENOUS

## 2014-02-14 MED ORDER — SCOPOLAMINE 1 MG/3DAYS TD PT72
MEDICATED_PATCH | TRANSDERMAL | Status: DC | PRN
  Administered 2014-02-14: 1 via TRANSDERMAL

## 2014-02-14 MED ORDER — HYDROMORPHONE HCL PF 1 MG/ML IJ SOLN
INTRAMUSCULAR | Status: AC
  Filled 2014-02-14: qty 1

## 2014-02-14 MED ORDER — METOCLOPRAMIDE HCL 10 MG PO TABS: 5.0000 mg | ORAL_TABLET | Freq: Three times a day (TID) | ORAL | Status: DC | PRN

## 2014-02-14 MED ORDER — OXYCODONE HCL 5 MG PO TABS: 5.0000 mg | ORAL_TABLET | Freq: Once | ORAL | Status: DC | PRN

## 2014-02-14 MED ORDER — ONDANSETRON HCL 4 MG/2ML IJ SOLN
INTRAMUSCULAR | Status: AC
  Filled 2014-02-14: qty 2

## 2014-02-14 MED ORDER — BUPIVACAINE-EPINEPHRINE (PF) 0.25% -1:200000 IJ SOLN
INTRAMUSCULAR | Status: AC
  Filled 2014-02-14: qty 30

## 2014-02-14 MED ORDER — DOCUSATE SODIUM 100 MG PO CAPS
100.0000 mg | ORAL_CAPSULE | Freq: Two times a day (BID) | ORAL | Status: DC
  Administered 2014-02-14 – 2014-02-15 (×4): 100 mg via ORAL
  Filled 2014-02-14 (×5): qty 1

## 2014-02-14 MED ORDER — KETOROLAC TROMETHAMINE 15 MG/ML IJ SOLN
15.0000 mg | Freq: Four times a day (QID) | INTRAMUSCULAR | Status: AC
  Administered 2014-02-14 (×2): 15 mg via INTRAVENOUS
  Filled 2014-02-14 (×2): qty 1

## 2014-02-14 MED ORDER — INSULIN ASPART 100 UNIT/ML ~~LOC~~ SOLN
0.0000 [IU] | Freq: Three times a day (TID) | SUBCUTANEOUS | Status: DC
  Administered 2014-02-14: 3 [IU] via SUBCUTANEOUS
  Administered 2014-02-15 – 2014-02-16 (×3): 2 [IU] via SUBCUTANEOUS

## 2014-02-14 MED ORDER — SODIUM CHLORIDE 0.9 % IJ SOLN
INTRAMUSCULAR | Status: AC
  Filled 2014-02-14: qty 10

## 2014-02-14 MED ORDER — BUPIVACAINE-EPINEPHRINE 0.25% -1:200000 IJ SOLN
INTRAMUSCULAR | Status: DC | PRN
Start: 2014-02-14 — End: 2014-02-14
  Administered 2014-02-14: 30 mL

## 2014-02-14 MED ORDER — EPHEDRINE SULFATE 50 MG/ML IJ SOLN
INTRAMUSCULAR | Status: DC | PRN
  Administered 2014-02-14: 5 mg via INTRAVENOUS
  Administered 2014-02-14: 15 mg via INTRAVENOUS
  Administered 2014-02-14 (×3): 10 mg via INTRAVENOUS

## 2014-02-14 MED ORDER — BISACODYL 10 MG RE SUPP: 10.0000 mg | Freq: Every day | RECTAL | Status: DC | PRN

## 2014-02-14 MED ORDER — PROPOFOL 10 MG/ML IV BOLUS
INTRAVENOUS | Status: DC | PRN
  Administered 2014-02-14: 50 mg via INTRAVENOUS
  Administered 2014-02-14: 150 mg via INTRAVENOUS

## 2014-02-14 MED ORDER — PHENOL 1.4 % MT LIQD: 1.0000 | OROMUCOSAL | Status: DC | PRN

## 2014-02-14 MED ORDER — ALBUMIN HUMAN 5 % IV SOLN
INTRAVENOUS | Status: DC | PRN
  Administered 2014-02-14: 09:00:00 via INTRAVENOUS

## 2014-02-14 MED ORDER — SODIUM CHLORIDE 0.9 % IV SOLN: INTRAVENOUS | Status: DC

## 2014-02-14 MED ORDER — ROPIVACAINE HCL 5 MG/ML IJ SOLN
INTRAMUSCULAR | Status: DC | PRN
  Administered 2014-02-14: 30 mL via PERINEURAL

## 2014-02-14 MED ORDER — METHOCARBAMOL 1000 MG/10ML IJ SOLN
500.0000 mg | Freq: Four times a day (QID) | INTRAVENOUS | Status: DC | PRN
  Administered 2014-02-14: 500 mg via INTRAVENOUS
  Filled 2014-02-14: qty 5

## 2014-02-14 MED ORDER — DEXAMETHASONE SODIUM PHOSPHATE 4 MG/ML IJ SOLN
INTRAMUSCULAR | Status: DC | PRN
  Administered 2014-02-14: 4 mg via INTRAVENOUS

## 2014-02-14 MED ORDER — KETOROLAC TROMETHAMINE 15 MG/ML IJ SOLN
INTRAMUSCULAR | Status: AC
  Filled 2014-02-14: qty 1

## 2014-02-14 MED ORDER — THROMBIN 20000 UNITS EX KIT
PACK | CUTANEOUS | Status: AC
  Filled 2014-02-14: qty 1

## 2014-02-14 MED ORDER — LOSARTAN POTASSIUM 50 MG PO TABS
100.0000 mg | ORAL_TABLET | Freq: Every day | ORAL | Status: DC
  Administered 2014-02-14: 100 mg via ORAL
  Filled 2014-02-14 (×2): qty 2

## 2014-02-14 MED ORDER — MIDAZOLAM HCL 5 MG/5ML IJ SOLN
INTRAMUSCULAR | Status: DC | PRN
  Administered 2014-02-14 (×2): 1 mg via INTRAVENOUS

## 2014-02-14 MED ORDER — ACETAMINOPHEN 650 MG RE SUPP: 650.0000 mg | Freq: Four times a day (QID) | RECTAL | Status: DC | PRN

## 2014-02-14 MED ORDER — SODIUM CHLORIDE 0.9 % IV SOLN
75.0000 mL/h | INTRAVENOUS | Status: DC
  Administered 2014-02-15 (×2): 75 mL/h via INTRAVENOUS

## 2014-02-14 MED ORDER — LOSARTAN POTASSIUM-HCTZ 100-25 MG PO TABS: 1.0000 | ORAL_TABLET | Freq: Every day | ORAL | Status: DC

## 2014-02-14 MED ORDER — ARTIFICIAL TEARS OP OINT
TOPICAL_OINTMENT | OPHTHALMIC | Status: AC
  Filled 2014-02-14: qty 3.5

## 2014-02-14 MED ORDER — CHLORHEXIDINE GLUCONATE 4 % EX LIQD
60.0000 mL | Freq: Once | CUTANEOUS | Status: DC
  Filled 2014-02-14: qty 60

## 2014-02-14 MED ORDER — ACETAMINOPHEN 10 MG/ML IV SOLN
INTRAVENOUS | Status: AC
  Filled 2014-02-14: qty 100

## 2014-02-14 MED ORDER — ACETAMINOPHEN 10 MG/ML IV SOLN
1000.0000 mg | Freq: Four times a day (QID) | INTRAVENOUS | Status: AC
  Administered 2014-02-14 – 2014-02-15 (×4): 1000 mg via INTRAVENOUS
  Filled 2014-02-14 (×5): qty 100

## 2014-02-14 MED ORDER — ONDANSETRON HCL 4 MG/2ML IJ SOLN
INTRAMUSCULAR | Status: DC | PRN
  Administered 2014-02-14: 4 mg via INTRAVENOUS

## 2014-02-14 MED ORDER — ACETAMINOPHEN 325 MG PO TABS: 650.0000 mg | ORAL_TABLET | Freq: Four times a day (QID) | ORAL | Status: DC | PRN

## 2014-02-14 MED ORDER — RIVAROXABAN 10 MG PO TABS
10.0000 mg | ORAL_TABLET | ORAL | Status: DC
  Administered 2014-02-15 – 2014-02-16 (×2): 10 mg via ORAL
  Filled 2014-02-14 (×3): qty 1

## 2014-02-14 MED ORDER — OXYCODONE HCL 5 MG/5ML PO SOLN: 5.0000 mg | Freq: Once | ORAL | Status: DC | PRN

## 2014-02-14 MED ORDER — EPHEDRINE SULFATE 50 MG/ML IJ SOLN
INTRAMUSCULAR | Status: AC
  Filled 2014-02-14: qty 1

## 2014-02-14 MED ORDER — ARTIFICIAL TEARS OP OINT
TOPICAL_OINTMENT | OPHTHALMIC | Status: DC | PRN
  Administered 2014-02-14: 1 via OPHTHALMIC

## 2014-02-14 MED ORDER — ONDANSETRON HCL 4 MG PO TABS: 4.0000 mg | ORAL_TABLET | Freq: Four times a day (QID) | ORAL | Status: DC | PRN

## 2014-02-14 MED ORDER — HYDROCHLOROTHIAZIDE 25 MG PO TABS
25.0000 mg | ORAL_TABLET | Freq: Every day | ORAL | Status: DC
  Administered 2014-02-14: 25 mg via ORAL
  Filled 2014-02-14 (×3): qty 1

## 2014-02-14 MED ORDER — LACTATED RINGERS IV SOLN
INTRAVENOUS | Status: DC | PRN
  Administered 2014-02-14 (×3): via INTRAVENOUS

## 2014-02-14 MED ORDER — HYDROMORPHONE HCL PF 1 MG/ML IJ SOLN
1.0000 mg | INTRAMUSCULAR | Status: DC | PRN
  Administered 2014-02-14 – 2014-02-15 (×3): 1 mg via INTRAVENOUS
  Filled 2014-02-14 (×3): qty 1

## 2014-02-14 MED ORDER — ACETAMINOPHEN 10 MG/ML IV SOLN
1000.0000 mg | Freq: Once | INTRAVENOUS | Status: AC
  Administered 2014-02-14: 1000 mg via INTRAVENOUS

## 2014-02-14 MED ORDER — CEFAZOLIN SODIUM-DEXTROSE 2-3 GM-% IV SOLR
2.0000 g | Freq: Four times a day (QID) | INTRAVENOUS | Status: AC
  Administered 2014-02-14 (×2): 2 g via INTRAVENOUS
  Filled 2014-02-14 (×2): qty 50

## 2014-02-14 MED ORDER — ONDANSETRON HCL 4 MG/2ML IJ SOLN: 4.0000 mg | Freq: Four times a day (QID) | INTRAMUSCULAR | Status: DC | PRN

## 2014-02-14 MED ORDER — METHOCARBAMOL 500 MG PO TABS
500.0000 mg | ORAL_TABLET | Freq: Four times a day (QID) | ORAL | Status: DC | PRN
  Administered 2014-02-15 – 2014-02-16 (×5): 500 mg via ORAL
  Filled 2014-02-14 (×7): qty 1

## 2014-02-14 MED ORDER — ALUM & MAG HYDROXIDE-SIMETH 200-200-20 MG/5ML PO SUSP: 30.0000 mL | ORAL | Status: DC | PRN

## 2014-02-14 MED ORDER — FENTANYL CITRATE 0.05 MG/ML IJ SOLN
INTRAMUSCULAR | Status: DC | PRN
  Administered 2014-02-14: 50 ug via INTRAVENOUS
  Administered 2014-02-14: 100 ug via INTRAVENOUS
  Administered 2014-02-14 (×2): 50 ug via INTRAVENOUS

## 2014-02-14 MED ORDER — HYDROMORPHONE HCL PF 1 MG/ML IJ SOLN
0.2500 mg | INTRAMUSCULAR | Status: DC | PRN
  Administered 2014-02-14 (×2): 0.25 mg via INTRAVENOUS

## 2014-02-14 MED ORDER — SODIUM CHLORIDE 0.9 % IR SOLN
Status: DC | PRN
  Administered 2014-02-14 (×2): 1000 mL

## 2014-02-14 MED ORDER — THROMBIN 20000 UNITS EX SOLR
CUTANEOUS | Status: AC
  Filled 2014-02-14: qty 20000

## 2014-02-14 MED ORDER — OXYCODONE HCL 5 MG PO TABS
5.0000 mg | ORAL_TABLET | ORAL | Status: DC | PRN
  Administered 2014-02-14 – 2014-02-16 (×10): 10 mg via ORAL
  Filled 2014-02-14 (×12): qty 2

## 2014-02-14 MED ORDER — METOCLOPRAMIDE HCL 5 MG/ML IJ SOLN: 5.0000 mg | Freq: Three times a day (TID) | INTRAMUSCULAR | Status: DC | PRN

## 2014-02-14 MED ORDER — LIDOCAINE HCL (CARDIAC) 20 MG/ML IV SOLN
INTRAVENOUS | Status: DC | PRN
  Administered 2014-02-14: 30 mg via INTRAVENOUS

## 2014-02-14 MED ORDER — 0.9 % SODIUM CHLORIDE (POUR BTL) OPTIME
TOPICAL | Status: DC | PRN
  Administered 2014-02-14: 1000 mL

## 2014-02-14 MED ORDER — FLEET ENEMA 7-19 GM/118ML RE ENEM: 1.0000 | ENEMA | Freq: Once | RECTAL | Status: AC | PRN

## 2014-02-14 MED ORDER — PHENYLEPHRINE HCL 10 MG/ML IJ SOLN
INTRAMUSCULAR | Status: DC | PRN
  Administered 2014-02-14 (×8): 80 ug via INTRAVENOUS

## 2014-02-14 MED ORDER — PHENYLEPHRINE 40 MCG/ML (10ML) SYRINGE FOR IV PUSH (FOR BLOOD PRESSURE SUPPORT)
PREFILLED_SYRINGE | INTRAVENOUS | Status: AC
  Filled 2014-02-14: qty 30

## 2014-02-14 SURGICAL SUPPLY — 65 items
BANDAGE ESMARK 6X9 LF (GAUZE/BANDAGES/DRESSINGS) ×1 IMPLANT
BLADE SAGITTAL 25.0X1.19X90 (BLADE) ×2 IMPLANT
BNDG ESMARK 6X9 LF (GAUZE/BANDAGES/DRESSINGS) ×2
BOWL SMART MIX CTS (DISPOSABLE) ×2 IMPLANT
CAP UPCHARGE REVISION TRAY ×2 IMPLANT
CAPT RP KNEE ×2 IMPLANT
CEMENT HV SMART SET (Cement) ×4 IMPLANT
COVER SURGICAL LIGHT HANDLE (MISCELLANEOUS) ×2 IMPLANT
CUFF TOURNIQUET SINGLE 34IN LL (TOURNIQUET CUFF) ×2 IMPLANT
CUFF TOURNIQUET SINGLE 44IN (TOURNIQUET CUFF) IMPLANT
DRAPE EXTREMITY T 121X128X90 (DRAPE) ×2 IMPLANT
DRAPE PROXIMA HALF (DRAPES) ×2 IMPLANT
DRSG ADAPTIC 3X8 NADH LF (GAUZE/BANDAGES/DRESSINGS) ×2 IMPLANT
DRSG PAD ABDOMINAL 8X10 ST (GAUZE/BANDAGES/DRESSINGS) ×4 IMPLANT
DURAPREP 26ML APPLICATOR (WOUND CARE) ×4 IMPLANT
ELECT CAUTERY BLADE 6.4 (BLADE) ×2 IMPLANT
ELECT REM PT RETURN 9FT ADLT (ELECTROSURGICAL) ×2
ELECTRODE REM PT RTRN 9FT ADLT (ELECTROSURGICAL) ×1 IMPLANT
EVACUATOR 1/8 PVC DRAIN (DRAIN) IMPLANT
FACESHIELD WRAPAROUND (MASK) ×4 IMPLANT
FLOSEAL 10ML (HEMOSTASIS) IMPLANT
GAUZE SPONGE 4X4 12PLY STRL (GAUZE/BANDAGES/DRESSINGS) ×2 IMPLANT
GLOVE BIO SURGEON STRL SZ7.5 (GLOVE) ×2 IMPLANT
GLOVE BIOGEL PI IND STRL 7.5 (GLOVE) ×1 IMPLANT
GLOVE BIOGEL PI IND STRL 8 (GLOVE) ×1 IMPLANT
GLOVE BIOGEL PI IND STRL 8.5 (GLOVE) ×1 IMPLANT
GLOVE BIOGEL PI INDICATOR 7.5 (GLOVE) ×1
GLOVE BIOGEL PI INDICATOR 8 (GLOVE) ×1
GLOVE BIOGEL PI INDICATOR 8.5 (GLOVE) ×1
GLOVE ECLIPSE 8.0 STRL XLNG CF (GLOVE) ×4 IMPLANT
GLOVE SURG ORTHO 8.5 STRL (GLOVE) ×4 IMPLANT
GOWN STRL REUS W/ TWL LRG LVL3 (GOWN DISPOSABLE) ×2 IMPLANT
GOWN STRL REUS W/ TWL XL LVL3 (GOWN DISPOSABLE) ×1 IMPLANT
GOWN STRL REUS W/TWL 2XL LVL3 (GOWN DISPOSABLE) ×2 IMPLANT
GOWN STRL REUS W/TWL LRG LVL3 (GOWN DISPOSABLE) ×2
GOWN STRL REUS W/TWL XL LVL3 (GOWN DISPOSABLE) ×1
HANDPIECE INTERPULSE COAX TIP (DISPOSABLE) ×1
KIT BASIN OR (CUSTOM PROCEDURE TRAY) ×2 IMPLANT
KIT ROOM TURNOVER OR (KITS) ×2 IMPLANT
MANIFOLD NEPTUNE II (INSTRUMENTS) ×2 IMPLANT
NEEDLE 22X1 1/2 (OR ONLY) (NEEDLE) IMPLANT
NS IRRIG 1000ML POUR BTL (IV SOLUTION) ×2 IMPLANT
PACK TOTAL JOINT (CUSTOM PROCEDURE TRAY) ×2 IMPLANT
PAD ARMBOARD 7.5X6 YLW CONV (MISCELLANEOUS) ×4 IMPLANT
PAD CAST 4YDX4 CTTN HI CHSV (CAST SUPPLIES) ×1 IMPLANT
PADDING CAST COTTON 4X4 STRL (CAST SUPPLIES) ×1
PADDING CAST COTTON 6X4 STRL (CAST SUPPLIES) ×2 IMPLANT
SET HNDPC FAN SPRY TIP SCT (DISPOSABLE) ×1 IMPLANT
SPONGE LAP 18X18 X RAY DECT (DISPOSABLE) ×2 IMPLANT
STAPLER VISISTAT 35W (STAPLE) ×4 IMPLANT
SUCTION FRAZIER TIP 10 FR DISP (SUCTIONS) ×2 IMPLANT
SUT BONE WAX W31G (SUTURE) ×2 IMPLANT
SUT ETHIBOND NAB CT1 #1 30IN (SUTURE) ×6 IMPLANT
SUT MNCRL AB 3-0 PS2 18 (SUTURE) ×4 IMPLANT
SUT VIC AB 0 CT1 27 (SUTURE) ×1
SUT VIC AB 0 CT1 27XBRD ANBCTR (SUTURE) ×1 IMPLANT
SUT VIC AB 1 CT1 27 (SUTURE) ×1
SUT VIC AB 1 CT1 27XBRD ANBCTR (SUTURE) ×1 IMPLANT
SYR CONTROL 10ML LL (SYRINGE) ×2 IMPLANT
TOWEL OR 17X24 6PK STRL BLUE (TOWEL DISPOSABLE) ×2 IMPLANT
TOWEL OR 17X26 10 PK STRL BLUE (TOWEL DISPOSABLE) ×2 IMPLANT
TRAY FOLEY CATH 14FR (SET/KITS/TRAYS/PACK) ×2 IMPLANT
TRAY FOLEY CATH 16FRSI W/METER (SET/KITS/TRAYS/PACK) IMPLANT
WATER STERILE IRR 1000ML POUR (IV SOLUTION) ×4 IMPLANT
WRAP KNEE MAXI GEL POST OP (GAUZE/BANDAGES/DRESSINGS) ×4 IMPLANT

## 2014-02-14 NOTE — Op Note (Signed)
PATIENT ID:      DORETHA GODING  MRN:     [id number] DOB/AGE:    November 26, 1955 / 58 y.o.       OPERATIVE REPORT    DATE OF PROCEDURE:  02/14/2014       PREOPERATIVE DIAGNOSIS:   LEFT KNEE OSTEOARTHRITIS-END STAGE                                                       Estimated body mass index is 41.18 kg/(m^2) as calculated from the following:   Height as of this encounter: 5\' 4"  (1.626 m).   Weight as of this encounter: 108.863 kg (240 lb).     POSTOPERATIVE DIAGNOSIS:   LEFT KNEE OSTEOARTHRITIS-END STAGE                                                                     Estimated body mass index is 41.18 kg/(m^2) as calculated from the following:   Height as of this encounter: 5\' 4"  (1.626 m).   Weight as of this encounter: 108.863 kg (240 lb).     PROCEDURE:  Procedure(s): TOTAL KNEE ARTHROPLASTY left     SURGEON:  [name], MD    ASSISTANT:   Biagio Borg, PA-C   (Present and scrubbed throughout the case, critical for assistance with exposure, retraction, instrumentation, and closure.)          ANESTHESIA: regional and general     DRAINS: (left knee) Hemovact drain(s) in the clamped with  Suction Clamped :      TOURNIQUET TIME:  Total Tourniquet Time Documented: Thigh (Left) - 94 minutes Total: Thigh (Left) - 94 minutes     COMPLICATIONS:  None   CONDITION:  stable  PROCEDURE IN DETAIL: [id number]  Kennita Pavlovich W 02/14/2014, 9:44 AM

## 2014-02-14 NOTE — H&P (Signed)
  The recent History & Physical has been reviewed. I have personally examined the patient today. There is no interval change to the documented History & Physical. The patient would like to proceed with the procedure.  Katherine Lawrence W 02/14/2014,  7:19 AM

## 2014-02-14 NOTE — Evaluation (Signed)
Physical Therapy Evaluation Patient Details Name: Katherine Lawrence MRN: [id number] DOB: 04/01/1956 Today's Date: 02/14/2014   History of Present Illness  Pt admitted 9/1 for L TKA. Pt with h/o of morbid obesity  Clinical Impression  Pt is s/p TKA resulting in the deficits listed below (see PT Problem List). Pt with L knee buckling this date requiring assist to prevent. Suspect once pt more awake she will be able to manage L LE better. Pt will benefit from skilled PT to increase their independence and safety with mobility to allow discharge to the venue listed below.      Follow Up Recommendations SNF;Supervision/Assistance - 24 hour    Equipment Recommendations  None recommended by PT    Recommendations for Other Services       Precautions / Restrictions Precautions Precautions: Knee Restrictions Weight Bearing Restrictions: Yes LLE Weight Bearing: Partial weight bearing LLE Partial Weight Bearing Percentage or Pounds: 50%      Mobility  Bed Mobility Overal bed mobility: Needs Assistance Bed Mobility: Supine to Sit     Supine to sit: Min assist     General bed mobility comments: assist for L LE, max directional v/c's for long sit technique  Transfers Overall transfer level: Needs assistance Equipment used: Rolling walker (2 wheeled) Transfers: Sit to/from [facility] Sit to Stand: Min assist;+2 physical assistance Stand pivot transfers: Min assist;+2 physical assistance       General transfer comment: 1 person at L knee to prevent buckling due to no KI ordered and pt lethargy. 1 person at gait belt. max directional v/c's for sequencing  Ambulation/Gait                Stairs            Wheelchair Mobility    Modified Rankin (Stroke Patients Only)       Balance                                             Pertinent Vitals/Pain Pain Assessment: 0-10 Pain Score: 3  Pain Location: L knee Pain Descriptors /  Indicators: Constant;Aching Pain Intervention(s): Premedicated before session    [place] expects to be discharged to:: Skilled nursing facility ([facility]) Living Arrangements: Spouse/significant other               Additional Comments: pt has 1 story home but spouse works and kids are grown    Prior Function Level of Independence: Independent with assistive device(s)         Comments: using RW prior to admite     Hand Dominance   Dominant Hand: Right    Extremity/Trunk Assessment   Upper Extremity Assessment: Overall WFL for tasks assessed           Lower Extremity Assessment: LLE deficits/detail   LLE Deficits / Details: able to initiate quad set but unable to achieve terminal knee ext  Cervical / Trunk Assessment: Normal  Communication   Communication: No difficulties  Cognition Arousal/Alertness: Lethargic (but arousable and alert) Behavior During Therapy: WFL for tasks assessed/performed Overall Cognitive Status: Within Functional Limits for tasks assessed                      General Comments      Exercises Total Joint Exercises Ankle Circles/Pumps: AROM;Both;10 reps;Supine Quad Sets: AROM;Left;10 reps;Supine Heel  Slides: AAROM;Left;10 reps;Supine Goniometric ROM: 25 deg active L knee flexion      Assessment/Plan    PT Assessment Patient needs continued PT services  PT Diagnosis Difficulty walking;Acute pain   PT Problem List Decreased strength;Decreased range of motion;Decreased activity tolerance;Decreased mobility;Decreased balance  PT Treatment Interventions DME instruction;Gait training;Stair training;Therapeutic exercise;Balance training;Therapeutic activities;Functional mobility training   PT Goals (Current goals can be found in the Care Plan section) Acute Rehab PT Goals Patient Stated Goal: to get stronger PT Goal Formulation: With patient Time For Goal Achievement: 02/21/14 Potential to Achieve  Goals: Good    Frequency 7X/week   Barriers to discharge        Co-evaluation               End of Session Equipment Utilized During Treatment: Gait belt Activity Tolerance: Patient tolerated treatment well Patient left: in chair;with call [place]/phone within reach Nurse Communication: Mobility status (transfer to the R (strong) side)         Time: 3716-9678 PT Time Calculation (min): 33 min   Charges:   PT Evaluation $Initial PT Evaluation Tier I: 1 Procedure PT Treatments $Gait Training: 8-22 mins $Therapeutic Exercise: 8-22 mins   PT G CodesKingsley Callander 02/14/2014, 4:36 PM   Kittie Plater, PT, DPT Pager #: [phone] Office #: [phone]

## 2014-02-14 NOTE — Op Note (Signed)
Katherine Lawrence, Katherine Lawrence NO.:  [id number]  MEDICAL RECORD NO.:  [id number]  LOCATION:  [facility]                         FACILITY:  [facility]  PHYSICIAN:  Vonna Kotyk. Shiquita Collignon, M.D.DATE OF BIRTH:  03-May-1956  DATE OF PROCEDURE:  02/14/2014 DATE OF DISCHARGE:                              OPERATIVE REPORT   PREOPERATIVE DIAGNOSES:  End-stage osteoarthritis, left knee and morbid obesity.  POSTOPERATIVE DIAGNOSES:  End-stage osteoarthritis, left knee and morbid obesity.  PROCEDURE:  Left total knee replacement.  SURGEON:  Vonna Kotyk. Durward Fortes, M.D.  ASSISTANT:  Aaron Edelman D. Petrarca, PA-C who was present throughout the operative procedure to ensure its timely completion.  ANESTHESIA:  General with supplemental femoral nerve block.  COMPLICATIONS:  None.  COMPONENTS: 1. DePuy LCS standard femoral component. 2. 0.5 revision tibial tray with a 15 mm polyethylene bridging     bearing. 3. PEG metal-backed patella; all components were secured with     polymethyl methacrylate.  PROCEDURE:  Katherine Lawrence was met with her husband in the holding area, identified the left knee as appropriate operative site and marked it accordingly.  She did receive a preoperative femoral nerve block per Anesthesia.  The patient was then transported to room #7 and placed under general anesthesia without difficulty.  Nursing staff inserted a Foley catheter and urine was clear.  We applied a tourniquet to the left thigh.  At that point, a time-out was called.  The left lower extremity was then prepped with chlorhexidine scrub and then DuraPrep with tourniquet to the tips of the toes.  Sterile draping was performed.  With the extremity still elevated, it was Esmarch exsanguinated with the proximal tourniquet at 350 mmHg.  The patient did have a preoperative fixed flexion contracture and did have some instability with varus and valgus stress.  A midline longitudinal incision was made centered about  the patella extending from the superior pouch to the tibial tubercle.  Via sharp dissection, incision was carried down to subcutaneous tissue.  There was a prepatellar bursa identified that I excised.  A medial parapatellar incision was then made through the deep capsule.  The joint was entered, there was a clear yellow joint effusion.  The patella was everted to 180 degrees and knee flexed 90 degrees.  There was abundant beefy red synovitis.  I did send the specimen to the lab for cytology. Synovectomy was performed.  There were large osteophytes along the medial and lateral femoral condyles.  Complete absence of articular cartilage on the medial femoral condyle and medial tibial plateau with large osteophytes.  The bone was eburnated.  There were large areas of articular cartilage loss on the lateral femoral condyle.  The osteophytes were removed and then I measured a standard femoral component.  Because of the patient's size, I elected to use the revision tibial tray.  The first bony cut was made in the tibia transversely using the external guide with a 2-degree angle of declination.  Because of the patient's relatively loose ligaments, I only resected minimal amount of bone.  Subsequent cuts were then made on the femur using the standard jig.  Laminar spreaders were placed around the medial and lateral  compartment to remove synovitis, medial and lateral menisci, ACL, and PCL.  There were large osteophytes identified on the posterior femoral condyle medially and laterally, these were removed with the curved 3/4 inch osteotome.  I then measured flexion/extension gaps, which were symmetrical at 15 mm.  There was a small Baker cyst.  I debrided much of the cyst as visualized.  There were no loose bodies.  Final bony cut was then made in the femur to obtain the tapered cuts in the center hole.  Retractor was then placed about the tibia, was advanced anteriorly, and measured a 2.5  tibial tray.  The initial center hole was then made followed by the keeled cut using the revision tibial tray.  With the tibial tray in place, I inserted the 15 mm bridging bearing followed by the trial standard femoral component.  This was reduced and through a full range of motion with full extension, no opening with varus or valgus stress, and nice rotation of the polyethylene bearing without malrotation.  The patella was prepared by removing 10 mm of patellar thickness leaving about 13 mm of patella.  Three holes were then made.  A trial patella was inserted and through full range of motion, it remained stable.  The joint was then copiously irrigated with saline solution.  Further synovectomy was performed.  I did check to be sure that the MCL and LCL were intact.  The final components were then impacted with polymethyl methacrylate. We initially inserted the revision tibial tray followed by the trial polyethylene bearing and then the standard femoral component. Extraneous methacrylate was removed from the periphery of the components.  The joint was then reduced and maintained in extension. Patella was applied with methacrylate and the bone and a patellar clamp.  At approximately 16 minutes of methacrylate had matured, there was still some remaining methacrylate in the intercondylar notch and behind the tibial component medially, these were removed.  The joint was then copiously irrigated with saline solution.  I exchanged the trial for the final 15 mm polyethylene bridging bearing without difficulty.  This was reduced and again through a full range of motion, there was no opening with varus or valgus stress, full extension, no malrotation of the polyethylene bearing, patellar tracked in the midline.  Tourniquet was deflated at 94 minutes, we had nice capillary refill to the cut surfaces.  We used 0.25% Marcaine with epinephrine to inject around the joint and then Bovie  coagulate any bleeding bone.  Bleeding was controlled with bone wax.  Medium-sized Hemovac was then inserted through the lateral compartment.  The deep capsule was closed with #1 Ethibond, superficial capsule with 0- Vicryl, subcu with 2-0 Vicryl and 3-0 Monocryl.  Skin closed with skin clips.  Sterile bulky dressing was applied followed by the patient's support stocking.  The patient tolerated the procedure well without complications.     Vonna Kotyk. Durward Fortes, M.D.     PWW/MEDQ  D:  02/14/2014  T:  02/14/2014  Job:  [id number]

## 2014-02-14 NOTE — Plan of Care (Signed)
Problem: Consults Goal: Diagnosis- Total Joint Replacement Primary Total Knee Left     

## 2014-02-14 NOTE — Anesthesia Preprocedure Evaluation (Addendum)
Anesthesia Evaluation  Patient identified by MRN, date of birth, ID band Patient awake    Reviewed: Allergy & Precautions, H&P , NPO status , Patient's Chart, lab work & pertinent test results  Airway Mallampati: III TM Distance: >3 FB Neck ROM: Full    Dental no notable dental hx. (+) Teeth Intact, Chipped, Dental Advisory Given   Pulmonary asthma , former smoker,  breath sounds clear to auscultation  Pulmonary exam normal       Cardiovascular hypertension, Pt. on medications Rhythm:Regular Rate:Normal     Neuro/Psych Depression negative neurological ROS     GI/Hepatic negative GI ROS, Neg liver ROS,   Endo/Other  diabetes, Type 2, Oral Hypoglycemic AgentsMorbid obesity  Renal/GU negative Renal ROS  negative genitourinary   Musculoskeletal  (+) Arthritis -, Osteoarthritis,    Abdominal   Peds  Hematology negative hematology ROS (+)   Anesthesia Other Findings   Reproductive/Obstetrics negative OB ROS                          Anesthesia Physical Anesthesia Plan  ASA: III  Anesthesia Plan: General and Regional   Post-op Pain Management:    Induction: Intravenous  Airway Management Planned: LMA and Oral ETT  Additional Equipment:   Intra-op Plan:   Post-operative Plan: Extubation in OR  Informed Consent: I have reviewed the patients History and Physical, chart, labs and discussed the procedure including the risks, benefits and alternatives for the proposed anesthesia with the patient or authorized representative who has indicated his/her understanding and acceptance.   Dental advisory given  Plan Discussed with: CRNA and Surgeon  Anesthesia Plan Comments:         Anesthesia Quick Evaluation

## 2014-02-14 NOTE — Progress Notes (Signed)
Patient has an IV in her left hand dressing clean dry and intact and infusing fluids

## 2014-02-14 NOTE — Progress Notes (Signed)
Utilization review completed.  

## 2014-02-14 NOTE — Transfer of Care (Signed)
Immediate Anesthesia Transfer of Care Note  Patient: Katherine Lawrence  Procedure(s) Performed: Procedure(s): TOTAL KNEE ARTHROPLASTY (Left)  Patient Location: PACU  Anesthesia Type:General and Regional  Level of Consciousness: awake, alert , oriented and sedated  Airway & Oxygen Therapy: Patient Spontanous Breathing, Patient connected to nasal cannula oxygen and Patient connected to face mask oxygen  Post-op Assessment: Report given to PACU RN, Post -op Vital signs reviewed and stable and Patient moving all extremities  Post vital signs: Reviewed and stable  Complications: No apparent anesthesia complications

## 2014-02-14 NOTE — Anesthesia Procedure Notes (Addendum)
Anesthesia Regional Block:  Femoral nerve block  Pre-Anesthetic Checklist: ,, timeout performed, Correct Patient, Correct Site, Correct Laterality, Correct Procedure, Correct Position, site marked, Risks and benefits discussed, pre-op evaluation,  At surgeon's request and post-op pain management  Laterality: Left  Prep: Maximum Sterile Barrier Precautions used and chloraprep       Needles:  Injection technique: Single-shot  Needle Type: Echogenic Stimulator Needle     Needle Length: 5cm 5 cm Needle Gauge: 22 and 22 G    Additional Needles:  Procedures: ultrasound guided (picture in chart) and nerve stimulator Femoral nerve block  Nerve Stimulator or Paresthesia:  Response: Patellar respose,   Additional Responses:   Narrative:  Start time: 02/14/2014 7:05 AM End time: 02/14/2014 7:15 AM Injection made incrementally with aspirations every 5 mL. Anesthesiologist: Fitzgerald,MD  Additional Notes: 2% Lidocaine skin wheel.    Procedure Name: LMA Insertion Date/Time: 02/14/2014 7:36 AM Performed by: Scheryl Darter Pre-anesthesia Checklist: Patient identified, Emergency Drugs available, Suction available, Patient being monitored and Timeout performed Patient Re-evaluated:Patient Re-evaluated prior to inductionOxygen Delivery Method: Circle system utilized Preoxygenation: Pre-oxygenation with 100% oxygen Intubation Type: IV induction Ventilation: Mask ventilation without difficulty LMA: LMA inserted LMA Size: 4.0 Number of attempts: 1 Placement Confirmation: positive ETCO2 and breath sounds checked- equal and bilateral Tube secured with: Tape Dental Injury: Teeth and Oropharynx as per pre-operative assessment

## 2014-02-14 NOTE — Progress Notes (Signed)
IV still infusing fluids, patient dressing clean dry and intact.

## 2014-02-15 ENCOUNTER — Encounter (HOSPITAL_COMMUNITY): Payer: Self-pay | Admitting: General Practice

## 2014-02-15 LAB — BASIC METABOLIC PANEL
Anion gap: 10 (ref 5–15)
BUN: 18 mg/dL (ref 6–23)
CO2: 28 mEq/L (ref 19–32)
CREATININE: 0.7 mg/dL (ref 0.50–1.10)
Calcium: 7.8 mg/dL — ABNORMAL LOW (ref 8.4–10.5)
Chloride: 103 mEq/L (ref 96–112)
GFR calc non Af Amer: >90 mL/min (ref >90)
GLUCOSE: 118 mg/dL — AB (ref 70–99)
Potassium: 4 mEq/L (ref 3.7–5.3)
Sodium: 141 mEq/L (ref 137–147)

## 2014-02-15 LAB — CBC
HEMATOCRIT: 36.5 % (ref 36.0–46.0)
Hemoglobin: 11.1 g/dL — ABNORMAL LOW (ref 12.0–15.0)
MCH: 28.2 pg (ref 26.0–34.0)
MCHC: 30.4 g/dL (ref 30.0–36.0)
MCV: 92.9 fL (ref 78.0–100.0)
Platelets: 193 10*3/uL (ref 150–400)
RBC: 3.93 MIL/uL (ref 3.87–5.11)
RDW: 13.9 % (ref 11.5–15.5)
WBC: 9.5 10*3/uL (ref 4.0–10.5)

## 2014-02-15 LAB — GLUCOSE, CAPILLARY
Glucose-Capillary: 118 mg/dL — ABNORMAL HIGH (ref 70–99)
Glucose-Capillary: 106 mg/dL — ABNORMAL HIGH (ref 70–99)
Glucose-Capillary: 148 mg/dL — ABNORMAL HIGH (ref 70–99)
Glucose-Capillary: 140 mg/dL — ABNORMAL HIGH (ref 70–99)

## 2014-02-15 LAB — URINALYSIS, ROUTINE W REFLEX MICROSCOPIC
Bilirubin Urine: NEGATIVE
GLUCOSE, UA: NEGATIVE mg/dL
Ketones, ur: NEGATIVE mg/dL
Leukocytes, UA: NEGATIVE
Nitrite: NEGATIVE
Protein, ur: NEGATIVE mg/dL
SPECIFIC GRAVITY, URINE: 1.021 (ref 1.005–1.030)
UROBILINOGEN UA: 0.2 mg/dL (ref 0.0–1.0)
pH: 5 (ref 5.0–8.0)

## 2014-02-15 LAB — URINE MICROSCOPIC-ADD ON

## 2014-02-15 MED ORDER — SODIUM CHLORIDE 0.9 % IV BOLUS (SEPSIS)
500.0000 mL | Freq: Once | INTRAVENOUS | Status: AC
  Administered 2014-02-15: 500 mL via INTRAVENOUS

## 2014-02-15 NOTE — Progress Notes (Signed)
Following 500 cc NS bolus, patient's BP = 99/47.

## 2014-02-15 NOTE — Progress Notes (Signed)
Patient's BP= 83/40, on call Dr. Sharol Given contacted.  Orders to give 500 cc bolus of NS and to discontinue Losartan and Hydrochlorothiazide.  Patient alert and oriented, pain 4/10.  Pain medication provided for left knee pain (10mg  Oxycodone po).  Will continue to monitor patient and retake blood pressure upon completion of bolus.  Will inform RN at shift change of new BP reading.

## 2014-02-15 NOTE — Anesthesia Postprocedure Evaluation (Signed)
  Anesthesia Post-op Note  Patient: Katherine Lawrence  Procedure(s) Performed: Procedure(s): TOTAL KNEE ARTHROPLASTY (Left)  Patient Location: PACU  Anesthesia Type:General and Regional  Level of Consciousness: awake  Airway and Oxygen Therapy: Patient Spontanous Breathing  Post-op Pain: moderate  Post-op Assessment: Post-op Vital signs reviewed, Patient's Cardiovascular Status Stable, Respiratory Function Stable, Patent Airway, No signs of Nausea or vomiting and Pain level controlled  Post-op Vital Signs: Reviewed and stable  Last Vitals:  Filed Vitals:   02/15/14 1233  BP:   Pulse:   Temp:   Resp: 18    Complications: No apparent anesthesia complications

## 2014-02-15 NOTE — Clinical Social Work Placement (Signed)
Clinical Social Work Department CLINICAL SOCIAL WORK PLACEMENT NOTE 02/15/2014  Patient:  Katherine Lawrence, Katherine Lawrence  Account Number:  [id number] Admit date:  02/14/2014  Clinical Social Worker:  Domenica Reamer, CLINICAL SOCIAL WORKER  Date/time:  02/15/2014 12:25 PM  Clinical Social Work is seeking post-discharge placement for this patient at the following level of care:   SKILLED NURSING   (*CSW will update this form in Epic as items are completed)   02/15/2014  Patient/family provided with [place] Department of Clinical Social Work's list of facilities offering this level of care within the geographic area requested by the patient (or if unable, by the patient's family).  02/15/2014  Patient/family informed of their freedom to choose among providers that offer the needed level of care, that participate in Medicare, Medicaid or managed care program needed by the patient, have an available bed and are willing to accept the patient.  02/15/2014  Patient/family informed of [facility]' ownership interest in [facility], as well as of the fact that they are under no obligation to receive care at this facility.  PASARR submitted to EDS on 02/15/2014 PASARR number received on 02/15/2014  FL2 transmitted to all facilities in geographic area requested by pt/family on  02/15/2014 FL2 transmitted to all facilities within larger geographic area on   Patient informed that his/her managed care company has contracts with or will negotiate with  certain facilities, including the following:     Patient/family informed of bed offers received:   Patient chooses bed at  Physician recommends and patient chooses bed at    Patient to be transferred to  on   Patient to be transferred to facility by  Patient and family notified of transfer on  Name of family member notified:    The following physician request were entered in Epic:   Additional Comments:   Domenica Reamer, [place] Social  Worker [phone]

## 2014-02-15 NOTE — Progress Notes (Signed)
OT Cancellation Note  Patient Details Name: Katherine Lawrence MRN: [id number] DOB: December 23, 1955   Cancelled Treatment:    Reason Eval/Treat Not Completed: Patient declined, no reason specified. Attempted to see pt x2 this date. First time pt was fatigued and requested that OT come back in an hour. When OT returned, pt's dinner had arrived and pt requested to defer eval at this time. OT will follow up as available to complete evaluation.   Juluis Rainier [phone] 02/15/2014, 4:56 PM

## 2014-02-15 NOTE — Clinical Social Work Psychosocial (Signed)
Clinical Social Work Department BRIEF PSYCHOSOCIAL ASSESSMENT 02/15/2014  Patient:  Katherine Lawrence, Katherine Lawrence     Account Number:  [id number]     Admit date:  02/14/2014  Clinical Social Worker:  Wylene Men  Date/Time:  02/15/2014 10:56 AM  Referred by:  Physician  Date Referred:  02/15/2014 Referred for  SNF Placement  Psychosocial assessment   Other Referral:   none   Interview type:  Patient Other interview type:   none    PSYCHOSOCIAL DATA Living Status:  HUSBAND Admitted from facility:   Level of care:   Primary support name:   Primary support relationship to patient:  SPOUSE Degree of support available:   adequate    CURRENT CONCERNS Current Concerns  Post-Acute Placement   Other Concerns:   none    SOCIAL WORK ASSESSMENT / PLAN CSW assessed pt at bedside. Pt was alert and oriented x4. PT is recommending SNF/STR upon being medically discharged. Pt is aware and agreeable.  Pt states she has pre-registered with [facility] prior to admission.  Pt reports nervousness regarding STR/SNF, but states that she is aware it is necessary for her physical progress.  CSW provided psychoeducation and support.   Assessment/plan status:  Psychosocial Support/Ongoing Assessment of Needs Other assessment/ plan:   FL2  PASARR   Information/referral to community resources:   SNF- [facility]    PATIENT'S/FAMILY'S RESPONSE TO PLAN OF CARE: Pt is agreeable to STR/SNF and appreciative of CSW assistance.       Nonnie Done, [place] [phone]  Psychiatric & Orthopedics (5N 1-16) Clinical Social Worker

## 2014-02-15 NOTE — Care Management Note (Signed)
CARE MANAGEMENT NOTE 02/15/2014  Patient:  Katherine Lawrence, Katherine Lawrence   Account Number:  [id number]  Date Initiated:  02/15/2014  Documentation initiated by:  Ricki Miller  Subjective/Objective Assessment:   58 yr old female admitted with left knee osteoarthritis, s/p left total knee arthroplasty.     Action/Plan:   Patient was preoperatively setup to for rehab at [facility]. Social Worker is aware.   Anticipated DC Date:  02/16/2014   Anticipated DC Plan:  SKILLED NURSING FACILITY  In-house referral  Clinical Social Worker      DC Planning Services  CM consult      [facility] Choice  NA   Choice offered to / List presented to:     DME arranged  NA           Status of service:  In process, will continue to follow Medicare Important Message given?   (If response is "NO", the following Medicare IM given date fields will be blank) Date Medicare IM given:   Medicare IM given by:   Date Additional Medicare IM given:   Additional Medicare IM given by:    Discharge Disposition:  [place]  Per UR Regulation:  Reviewed for med. necessity/level of care/duration of stay  If discussed at [place] of Stay Meetings, dates discussed:    Comments:

## 2014-02-15 NOTE — Progress Notes (Signed)
Physical Therapy Treatment Patient Details Name: KALY MCQUARY MRN: [id number] DOB: 1955/10/10 Today's Date: 02/15/2014    History of Present Illness Pt admitted 9/1 for L TKA. Pt with h/o of morbid obesity    PT Comments    Patient making much better progress this morning. Able to walk with no buckling or no complaints of dizziness. Requested to go back to bed as she had been up most of morning. Continue with current POC  Follow Up Recommendations  SNF;Supervision/Assistance - 24 hour     Equipment Recommendations  None recommended by PT    Recommendations for Other Services       Precautions / Restrictions Precautions Precautions: Knee Restrictions LLE Weight Bearing: Partial weight bearing LLE Partial Weight Bearing Percentage or Pounds: 50%    Mobility  Bed Mobility Overal bed mobility: Needs Assistance       Supine to sit: Min assist     General bed mobility comments: assist for L LE, max directional v/c's for long sit technique  Transfers Overall transfer level: Needs assistance Equipment used: Rolling walker (2 wheeled) Transfers: Sit to/from Stand Sit to Stand: Min assist         General transfer comment: Min A for support and to power up. Cues for hand placement  Ambulation/Gait Ambulation/Gait assistance: Min assist Ambulation Distance (Feet): 50 Feet Assistive device: Rolling walker (2 wheeled) Gait Pattern/deviations: Step-to pattern   Gait velocity interpretation: Below normal speed for age/gender General Gait Details: Cues for gait pattern, RW management and WBing status. No buckling noted   Stairs            Wheelchair Mobility    Modified Rankin (Stroke Patients Only)       Balance                                    Cognition Arousal/Alertness: Awake/alert Behavior During Therapy: WFL for tasks assessed/performed Overall Cognitive Status: Within Functional Limits for tasks assessed                      Exercises Total Joint Exercises Quad Sets: AROM;Left;10 reps;Supine Heel Slides: AAROM;Left;10 reps;Supine Hip ABduction/ADduction: AAROM;Left;10 reps Straight Leg Raises: Left;10 reps    General Comments        Pertinent Vitals/Pain      Home Living Family/patient expects to be discharged to:: Other (Comment) ([place]) Living Arrangements: Spouse/significant other                  Prior Function            PT Goals (current goals can now be found in the care plan section) Progress towards PT goals: Progressing toward goals    Frequency  7X/week    PT Plan Current plan remains appropriate    Co-evaluation             End of Session Equipment Utilized During Treatment: Gait belt Activity Tolerance: Patient tolerated treatment well Patient left: with call Dol[place]/phone within reach;in bed     Time: 2355-7322 PT Time Calculation (min): 25 min  Charges:  $Gait Training: 8-22 mins $Therapeutic Exercise: 8-22 mins                    G Codes:      Jacqualyn Posey 02/15/2014, 12:46 PM 02/15/2014 Jacqualyn Posey PTA [phone] pager [phone] office

## 2014-02-15 NOTE — Clinical Social Work Note (Signed)
Pt will dc to [facility] once medically stable.  [facility] is aware projected dc 02/16/2014 (Thursday).  Pt will be transported via [facility].  Nonnie Done, [place] [phone]  Psychiatric & Orthopedics (5N 1-16) Clinical Social Worker

## 2014-02-15 NOTE — Progress Notes (Signed)
Patient ID: HOLLEIGH CRIHFIELD, female   DOB: Apr 19, 1956, 58 y.o.   MRN: [id number] PATIENT ID: KAYLONI ROCCO        MRN:  [id number]          DOB/AGE: [birth year] / 58 y.o.    [name], MD   Biagio Borg, PA-C [address] [place], [place]  [address]                             [phone]   PROGRESS NOTE  Subjective:  negative for Chest Pain  negative for Shortness of Breath  negative for Nausea/Vomiting   negative for Calf Pain    Tolerating Diet: yes         Patient reports pain as mild.     Slept well last night,comfortable this am  Objective: Vital signs in last 24 hours:   Patient Vitals for the past 24 hrs:  BP Temp Temp src Pulse Resp SpO2  02/15/14 0753 - - - - 18 99 %  02/15/14 0520 83/40 mmHg 98.1 F (36.7 C) - 75 16 99 %  02/15/14 0400 - - - - 16 99 %  02/15/14 0111 110/52 mmHg 98 F (36.7 C) - 66 18 100 %  02/15/14 0000 - - - - 18 95 %  02/14/14 2144 114/51 mmHg 97.9 F (36.6 C) - 76 18 95 %  02/14/14 2000 - - - - 18 95 %  02/14/14 1622 100/46 mmHg 98.3 F (36.8 C) Oral 87 18 96 %  02/14/14 1332 - - - - - 98 %  02/14/14 1300 105/56 mmHg 98.6 F (37 C) - 80 18 96 %  02/14/14 1245 99/57 mmHg - - 80 16 95 %  02/14/14 1230 104/59 mmHg - - 80 22 94 %  02/14/14 1215 106/60 mmHg - - 84 24 94 %  02/14/14 1200 - - - 86 13 93 %  02/14/14 1145 107/61 mmHg - - 87 23 89 %  02/14/14 1130 108/61 mmHg - - 89 12 97 %  02/14/14 1115 109/58 mmHg - - 89 22 95 %  02/14/14 1100 112/59 mmHg - - 85 14 97 %  02/14/14 1045 125/69 mmHg - - 92 13 96 %  02/14/14 1038 131/83 mmHg 98.4 F (36.9 C) - 100 17 86 %      Intake/Output from previous day:   09/01 0701 - 09/02 0700 In: 3283.8 [P.O.:240; I.V.:2593.8] Out: 2300 [QASTM:1962; Drains:525]   Intake/Output this shift:       Intake/Output     09/01 0701 - 09/02 0700 09/02 0701 - 09/03 0700   P.O. 240    I.V. (mL/kg) 2593.8 (23.8)    IV Piggyback 450    Total Intake(mL/kg) 3283.8 (30.2)    Urine (mL/kg/hr) 1775  (0.7)    Drains 525 (0.2)    Total Output 2300     Net +983.8             LABORATORY DATA:  Recent Labs  02/15/14 0715  WBC 9.5  HGB 11.1*  HCT 36.5  PLT 193   No results found for this basename: NA, K, CL, CO2, BUN, CREATININE, GLUCOSE, CALCIUM,  in the last 168 hours Lab Results  Component Value Date   INR 1.01 02/07/2014    Recent Radiographic Studies :  Chest 2 View  02/07/2014   CLINICAL DATA:  Pre operative respiratory exam. Arthritis of the knee.  EXAM: CHEST  2 VIEW  COMPARISON:  07/28/2011 and 03/31/2007 and chest CT dated 09/10/2006  FINDINGS: Heart size and pulmonary vascularity are normal and the lungs are clear. The patient has prominent right and left pericardial fat pads. No effusions. No significant osseous abnormality.  IMPRESSION: No active cardiopulmonary disease.   Electronically Signed   By: Rozetta Nunnery M.D.   On: 02/07/2014 20:52   Dg Chest Port 1 View  02/14/2014   CLINICAL DATA:  Status post knee surgery. Abnormal physical examination.  EXAM: PORTABLE CHEST - 1 VIEW  COMPARISON:  PA and lateral chest 02/07/2014 and 07/28/2011.  FINDINGS:  The left costophrenic angle is just off the margin of the film. Heart size is upper normal with vascular congestion. No consolidative process or pneumothorax.  IMPRESSION: Pulmonary vascular congestion without frank edema   Electronically Signed   By: Inge Rise M.D.   On: 02/14/2014 12:05     Examination:  General appearance: alert, cooperative and no distress  Wound Exam: clean, dry, intact   Drainage:  Moderate amount Serosanguinous exudate in hemovac-50cc's this am-hold until am  Motor Exam: EHL, FHL, Anterior Tibial and Posterior Tibial Intact  Sensory Exam: Superficial Peroneal normal  Vascular Exam: Normal  Assessment:    1 Day Post-Op  Procedure(s) (LRB): TOTAL KNEE ARTHROPLASTY (Left)  ADDITIONAL DIAGNOSIS:  Active Problems:   S/P total knee replacement using cement  hypotension-probably  multifactorial-will Rx with fluids,careful with meds   Plan: Physical Therapy as ordered Partial Weight Bearing @ 50% (PWB)  DVT Prophylaxis:  Xarelto, Foot Pumps and TED hose  DISCHARGE PLAN: Skilled Nursing Facility/Rehab-[facility]  DISCHARGE NEEDS: HHPT, CPM, Walker and 3-in-1 comode seat  Foley out,OOB with PT, continue IV fluids     Byrd Rushlow W  02/15/2014 7:54 AM

## 2014-02-16 ENCOUNTER — Encounter (HOSPITAL_COMMUNITY): Payer: Self-pay | Admitting: Orthopaedic Surgery

## 2014-02-16 LAB — URINE CULTURE
COLONY COUNT: NO GROWTH
Culture: NO GROWTH

## 2014-02-16 LAB — CBC
HCT: 34.8 % — ABNORMAL LOW (ref 36.0–46.0)
HEMOGLOBIN: 10.8 g/dL — AB (ref 12.0–15.0)
MCH: 29.3 pg (ref 26.0–34.0)
MCHC: 31 g/dL (ref 30.0–36.0)
MCV: 94.3 fL (ref 78.0–100.0)
Platelets: 147 10*3/uL — ABNORMAL LOW (ref 150–400)
RBC: 3.69 MIL/uL — AB (ref 3.87–5.11)
RDW: 13.8 % (ref 11.5–15.5)
WBC: 10.5 10*3/uL (ref 4.0–10.5)

## 2014-02-16 LAB — BASIC METABOLIC PANEL
Anion gap: 15 (ref 5–15)
BUN: 9 mg/dL (ref 6–23)
CALCIUM: 7.9 mg/dL — AB (ref 8.4–10.5)
CO2: 26 meq/L (ref 19–32)
CREATININE: 0.4 mg/dL — AB (ref 0.50–1.10)
Chloride: 101 mEq/L (ref 96–112)
GFR calc Af Amer: >90 mL/min (ref >90)
GLUCOSE: 118 mg/dL — AB (ref 70–99)
Potassium: 4.2 mEq/L (ref 3.7–5.3)
SODIUM: 142 meq/L (ref 137–147)

## 2014-02-16 LAB — GLUCOSE, CAPILLARY
GLUCOSE-CAPILLARY: 111 mg/dL — AB (ref 70–99)
Glucose-Capillary: 136 mg/dL — ABNORMAL HIGH (ref 70–99)
Glucose-Capillary: 137 mg/dL — ABNORMAL HIGH (ref 70–99)

## 2014-02-16 MED ORDER — RIVAROXABAN 10 MG PO TABS: 10.0000 mg | ORAL_TABLET | ORAL | Status: DC

## 2014-02-16 MED ORDER — OXYCODONE HCL 5 MG PO TABS: 5.0000 mg | ORAL_TABLET | ORAL | Status: DC | PRN

## 2014-02-16 MED ORDER — DSS 100 MG PO CAPS: 100.0000 mg | ORAL_CAPSULE | Freq: Two times a day (BID) | ORAL | Status: DC

## 2014-02-16 NOTE — Discharge Summary (Signed)
[name], MD   Biagio Borg, PA-C [address] [place], [place]  [address]                             [phone]  PATIENT ID: Katherine Lawrence        MRN:  [id number]          DOB/AGE: Apr 06, 1956 / 58 y.o.    DISCHARGE SUMMARY  ADMISSION DATE:    02/14/2014 DISCHARGE DATE:   02/16/2014   ADMISSION DIAGNOSIS: LEFT KNEE OSTEOARTHRITIS    DISCHARGE DIAGNOSIS:  LEFT KNEE OSTEOARTHRITIS    ADDITIONAL DIAGNOSIS: Active Problems:   S/P total knee replacement using cement  Past Medical History  Diagnosis Date  . Diabetes type 2, controlled   . HYPERCHOLESTEROLEMIA   . DEPRESSION   . HYPERTENSION   . 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 Osteoarthritis   . Postsurgical menopause   . Ovarian cyst   . Heart murmur   . Asthma   . Pneumonia 06/2003    PROCEDURE: Procedure(s): TOTAL KNEE ARTHROPLASTY Left on 02/14/2014  CONSULTS: none     HISTORY: Katherine Lawrence is seen today for evaluation of her left knee. She had been seen previously in 2013 with x-rays that revealed an end-stage osteoarthritis of the left knee. She has been significantly compromised with her activities, because of this. She initially had been using a cane and now she is actually into a walker. She has difficulty with sleeping at nighttime and has tried multiple anti-inflammatories. She has had a right total hip in the past and is starting to have some mild degenerative changes in the left hip. The left hip had been injected by Dr. Ernestina Patches with a short time of relief, however, she continues to have pain and discomfort in the left knee to the point where it has affected her activities of daily living. She cannot sleep. She is quite uncomfortable and has to use assisted devices to ambulate   HOSPITAL COURSE:  Katherine Lawrence is a 58 y.o. admitted on 02/14/2014 and found to have a diagnosis of LEFT KNEE OSTEOARTHRITIS.  After appropriate laboratory studies were obtained  they were  taken to the operating room on 02/14/2014 and underwent  Procedure(s): TOTAL KNEE ARTHROPLASTY  Left.   They were given perioperative antibiotics:  Anti-infectives   Start     Dose/Rate Route Frequency Ordered Stop   02/14/14 1400  ceFAZolin (ANCEF) IVPB 2 g/50 mL premix     2 g 100 mL/hr over 30 Minutes Intravenous Every 6 hours 02/14/14 1332 02/14/14 2029   02/14/14 0600  ceFAZolin (ANCEF) IVPB 2 g/50 mL premix     2 g 100 mL/hr over 30 Minutes Intravenous On call to O.R. 02/13/14 1440 02/14/14 0751    .  Tolerated the procedure well.  Placed with a foley intraoperatively.    toradol was given post op.  POD #1, allowed out of bed to a chair.  PT for ambulation and exercise program.  Foley D/C'd in morning.  IV saline locked.  O2 discontionued. Had urinary retention with bladder scan revealing only 50ml however, cath revealed 521ml and the foley was kept till the morning.  POD #2, continued PT and ambulation.   Foley pulled, urinating. Hemovac pulled. Dressing changed.   The remainder of the hospital course was dedicated to ambulation and strengthening.   The patient was  discharged on 2 Days Post-Op in  Stable condition.  Blood products given:none  DIAGNOSTIC STUDIES: Recent vital signs: Patient Vitals for the past 24 hrs:  BP Temp Temp src Pulse Resp SpO2  02/16/14 1333 151/65 mmHg 99.7 F (37.6 C) Oral 93 18 99 %  02/16/14 0510 151/72 mmHg 98.8 F (37.1 C) Oral 89 18 95 %  02/16/14 0400 - - - - 16 94 %  02/16/14 0106 143/74 mmHg 99.1 F (37.3 C) Oral 90 18 93 %  02/16/14 0000 - - - - 18 95 %  02/15/14 2229 - 98.4 F (36.9 C) - 88 17 94 %  02/15/14 2045 156/68 mmHg - - - - -  02/15/14 1957 - - - - 17 93 %  02/15/14 1600 - - - - 18 94 %       Recent laboratory studies:  Recent Labs  02/15/14 0715 02/16/14 0657  WBC 9.5 10.5  HGB 11.1* 10.8*  HCT 36.5 34.8*  PLT 193 147*    Recent Labs  02/15/14 0715 02/16/14 0657  NA 141 142  K 4.0 4.2  CL 103 101  CO2 28  26  BUN 18 9  CREATININE 0.70 0.40*  GLUCOSE 118* 118*  CALCIUM 7.8* 7.9*   Lab Results  Component Value Date   INR 1.01 02/07/2014     Recent Radiographic Studies :  Chest 2 View  02/07/2014   CLINICAL DATA:  Pre operative respiratory exam. Arthritis of the knee.  EXAM: CHEST  2 VIEW  COMPARISON:  07/28/2011 and 03/31/2007 and chest CT dated 09/10/2006  FINDINGS: Heart size and pulmonary vascularity are normal and the lungs are clear. The patient has prominent right and left pericardial fat pads. No effusions. No significant osseous abnormality.  IMPRESSION: No active cardiopulmonary disease.   Electronically Signed   By: Rozetta Nunnery M.D.   On: 02/07/2014 20:52   Dg Chest Port 1 View  02/14/2014   CLINICAL DATA:  Status post knee surgery. Abnormal physical examination.  EXAM: PORTABLE CHEST - 1 VIEW  COMPARISON:  PA and lateral chest 02/07/2014 and 07/28/2011.  FINDINGS:  The left costophrenic angle is just off the margin of the film. Heart size is upper normal with vascular congestion. No consolidative process or pneumothorax.  IMPRESSION: Pulmonary vascular congestion without frank edema   Electronically Signed   By: Inge Rise M.D.   On: 02/14/2014 12:05    DISCHARGE INSTRUCTIONS: Discharge Instructions   CPM    Complete by:  As directed   Continuous passive motion machine (CPM):      Use the CPM from 0 to 60 for 6-8 hours per day.      You may increase by 5-10 per day.  You may break it up into 2 or 3 sessions per day.      Use CPM for 3-4 weeks or until you are told to stop.     Call MD / Call 911    Complete by:  As directed   If you experience chest pain or shortness of breath, CALL 911 and be transported to the hospital emergency room.  If you develop a fever above 101 F, pus (white drainage) or increased drainage or redness at the wound, or calf pain, call your surgeon's office.     Change dressing    Complete by:  As directed   DO NOT TOUCH DRESSING TILL SEEN IN OFFICE  IN 2 WEEKS     Constipation Prevention  Complete by:  As directed   Drink plenty of fluids.  Prune juice and/or coffee may be helpful.  You may use a stool softener, such as Colace (over the counter) 100 mg twice a day.  Use MiraLax (over the counter) for constipation as needed but this may take several days to work.  Mag Citrate --OR-- Milk of Magnesia --OR -- Dulcolax pills/suppositories may also be used but follow directions on the label.     Diet general    Complete by:  As directed      Discharge instructions    Complete by:  As directed   YOU WERE GIVEN A DEVICE CALLED AN INCENTIVE SPIROMETER TO HELP YOU TAKE DEEP BREATHS.  PLEASE USE THIS AT LEAST TEN (10) TIMES EVERY 1-2 HOURS EVERY DAY TO PREVENT PNEUMONIA.     Do not put a pillow under the knee. Place it under the heel.    Complete by:  As directed      Driving restrictions    Complete by:  As directed   No driving for 6 weeks     Increase activity slowly as tolerated    Complete by:  As directed      Lifting restrictions    Complete by:  As directed   No lifting for 6 weeks     Partial weight bearing    Complete by:  As directed   50 % WEIGHT BEARING AS TAUGHT IN PHYSICAL THERAPY     Patient may shower    Complete by:  As directed   You may shower over the brown dressing.  Once the dressing is removed you may shower without a dressing once there is no drainage.  Do not wash over the wound.  If drainage remains, cover wound with plastic wrap and then shower.     TED hose    Complete by:  As directed   Use stockings (TED hose) for 2 weeks on operative leg(s).  You may remove them at night for sleeping.           DISCHARGE MEDICATIONS:     Medication List    STOP taking these medications       aspirin 81 MG tablet     ibuprofen 200 MG tablet  Commonly known as:  ADVIL,MOTRIN     traMADol 50 MG tablet  Commonly known as:  ULTRAM      TAKE these medications       atorvastatin 20 MG tablet  Commonly known as:   LIPITOR  Take 20 mg by mouth at bedtime.     citalopram 40 MG tablet  Commonly known as:  CELEXA  Take 40 mg by mouth at bedtime.     DSS 100 MG Caps  Take 100 mg by mouth 2 (two) times daily.     losartan-hydrochlorothiazide 100-25 MG per tablet  Commonly known as:  HYZAAR  Take 1 tablet by mouth daily.     metFORMIN 500 MG tablet  Commonly known as:  GLUCOPHAGE  Take 1 tablet (500 mg total) by mouth 2 (two) times daily with a meal.     methocarbamol 500 MG tablet  Commonly known as:  ROBAXIN  Take 500 mg by mouth every 8 (eight) hours as needed for muscle spasms.     oxyCODONE 5 MG immediate release tablet  Commonly known as:  Oxy IR/ROXICODONE  Take 1-2 tablets (5-10 mg total) by mouth every 4 (four) hours as needed for breakthrough pain.  rivaroxaban 10 MG Tabs tablet  Commonly known as:  XARELTO  Take 1 tablet (10 mg total) by mouth daily.        FOLLOW UP VISIT:       Follow-up Information   Follow up with Despina Boan, PA-C. Schedule an appointment as soon as possible for a visit on 02/27/2014.   Specialty:  Orthopedic Surgery   Contact information:   [place] [address] [phone]       DISPOSITION:   Skilled Nursing Facility/Rehab  CONDITION:  Stable   Mike Craze. [place], [place] [phone]  02/16/2014 3:11 PM

## 2014-02-16 NOTE — Evaluation (Signed)
Occupational Therapy Evaluation Patient Details Name: Katherine Lawrence MRN: [id number] DOB: 1956-06-03 Today's Date: 02/16/2014    History of Present Illness Pt admitted 9/1 for L TKA. Pt with h/o of morbid obesity   Clinical Impression   This 57 yo female admitted and underwent above presents to acute OT with decreased LLE ROM, increased pain, decreased mobility, decreased balance, and obesity thus affecting her ability to care for herself at a Mod I level as she did pta. She will benefit from continued OT at SNF to get to a Mod I/Independent level.    Follow Up Recommendations  SNF    Equipment Recommendations   (TBD at next venue)       Precautions / Restrictions Precautions Precautions: Knee;Fall Restrictions Weight Bearing Restrictions: Yes LLE Weight Bearing: Partial weight bearing LLE Partial Weight Bearing Percentage or Pounds: 50%      Mobility Transfers Overall transfer level: Needs assistance Equipment used: Rolling walker (2 wheeled)   Sit to Stand: Min assist           Balance Overall balance assessment: Needs assistance Sitting-balance support: Feet supported;Single extremity supported Sitting balance-Leahy Scale: Poor Sitting balance - Comments: Even in sitting edge of chair pt has to at least prop on one elbow while using AE                                    ADL Overall ADL's : Needs assistance/impaired Eating/Feeding: Independent;Sitting   Grooming: Set up;Sitting   Upper Body Bathing: Set up;Sitting   Lower Body Bathing: Maximal assistance (with Min A sit<>stand)   Upper Body Dressing : Set up;Sitting   Lower Body Dressing: Moderate assistance (with AE with sit<>stand min A)                                 Pertinent Vitals/Pain Pain Assessment: 0-10 Pain Score: 9  Pain Location: left anterior thigh right above the knee Pain Descriptors / Indicators: Aching;Burning Pain Intervention(s): Monitored during  session;Repositioned     Hand Dominance Right   Extremity/Trunk Assessment Upper Extremity Assessment Upper Extremity Assessment: Overall WFL for tasks assessed           Communication Communication Communication: No difficulties   Cognition Arousal/Alertness: Awake/alert Behavior During Therapy: WFL for tasks assessed/performed Overall Cognitive Status: Within Functional Limits for tasks assessed                                Home Living Family/patient expects to be discharged to:: Skilled nursing facility Living Arrangements: Spouse/significant other                                      Prior Functioning/Environment Level of Independence: Independent with assistive device(s)        Comments: using RW prior to admit    OT Diagnosis: Generalized weakness;Acute pain   OT Problem List: Decreased strength;Decreased range of motion;Decreased activity tolerance;Impaired balance (sitting and/or standing);Pain;Obesity;Decreased knowledge of use of DME or AE;Decreased knowledge of precautions   OT Treatment/Interventions:      OT Goals(Current goals can be found in the care plan section) Acute Rehab OT Goals Patient Stated Goal: rehab then home  OT Frequency:  End of Session Equipment Utilized During Treatment:  (AE) CPM Left Knee CPM Left Knee: Off Nurse Communication:  (left pt on 2 liters of O2 with monitor on)  Activity Tolerance: Patient limited by pain Patient left: in chair;with call Iqbal/phone within reach   Time: 1101-1122 OT Time Calculation (min): 21 min Charges:  OT General Charges $OT Visit: 1 Procedure OT Evaluation $Initial OT Evaluation Tier I: 1 Procedure OT Treatments $Self Care/Home Management : 8-22 mins  Almon Register [phone] 02/16/2014, 11:32 AM

## 2014-02-16 NOTE — Progress Notes (Signed)
Physical Therapy Treatment Patient Details Name: ADENA SIMA MRN: [id number] DOB: 12-23-1955 Today's Date: 02/16/2014    History of Present Illness Pt admitted 9/1 for L TKA. Pt with h/o of morbid obesity    PT Comments    Patient limited by pain this session, believed that block has worn off. Patient agreeable to short ambulation and therex. Patient Desated to 85% on RA. Cues for pursed lip breathing. Patient is planning to DC to [facility] once medically stable.   Follow Up Recommendations  SNF;Supervision/Assistance - 24 hour     Equipment Recommendations  None recommended by PT    Recommendations for Other Services       Precautions / Restrictions Precautions Precautions: Knee Restrictions Weight Bearing Restrictions: Yes LLE Weight Bearing: Partial weight bearing LLE Partial Weight Bearing Percentage or Pounds: 50%    Mobility  Bed Mobility Overal bed mobility: Needs Assistance Bed Mobility: Supine to Sit     Supine to sit: Min assist     General bed mobility comments: assist for L LE,Patient able to bridge over to EOB  Transfers Overall transfer level: Needs assistance Equipment used: Rolling walker (2 wheeled)   Sit to Stand: Min assist         General transfer comment: Min A to ensure balance. Cues for hand placement and LLE placement  Ambulation/Gait Ambulation/Gait assistance: Min guard Ambulation Distance (Feet): 50 Feet Assistive device: Rolling walker (2 wheeled) Gait Pattern/deviations: Step-to pattern   Gait velocity interpretation: Below normal speed for age/gender General Gait Details: Cues for gait pattern, RW management. Slight buckling noted this session but patient able to self correct.    Stairs            Wheelchair Mobility    Modified Rankin (Stroke Patients Only)       Balance                                    Cognition Arousal/Alertness: Awake/alert Behavior During Therapy: WFL for tasks  assessed/performed Overall Cognitive Status: Within Functional Limits for tasks assessed                      Exercises Total Joint Exercises Quad Sets: AROM;Left;10 reps;Supine Heel Slides: AAROM;Left;10 reps;Supine Hip ABduction/ADduction: AAROM;Left;10 reps Straight Leg Raises: Left;10 reps;AAROM Long Arc Quad: AAROM;Left;5 reps    General Comments        Pertinent Vitals/Pain Pain Score: 10-Worst pain ever Pain Location: L knee pain Pain Descriptors / Indicators: Burning;Aching Pain Intervention(s): Monitored during session;Repositioned;Limited activity within patient's tolerance    Home Living                      Prior Function            PT Goals (current goals can now be found in the care plan section) Progress towards PT goals: Progressing toward goals    Frequency  7X/week    PT Plan Current plan remains appropriate    Co-evaluation             End of Session Equipment Utilized During Treatment: Gait belt Activity Tolerance: Patient limited by pain Patient left: with call [place]lo[place]/phone within reach;in bed     Time: 3220-2542 PT Time Calculation (min): 31 min  Charges:  $Gait Training: 8-22 mins $Therapeutic Exercise: 8-22 mins  G Codes:      Jacqualyn Posey 02/16/2014, 10:19 AM 02/16/2014 Jacqualyn Posey PTA [phone] pager [phone] office

## 2014-02-16 NOTE — Discharge Instructions (Signed)
Information on my medicine - XARELTO® (Rivaroxaban) ° °This medication education was reviewed with me or my healthcare representative as part of my discharge preparation.  The pharmacist that spoke with me during my hospital stay was:  Mckenleigh Tarlton Prescott, RPH ° °Why was Xarelto® prescribed for you? °Xarelto® was prescribed for you to reduce the risk of blood clots forming after orthopedic surgery. The medical term for these abnormal blood clots is venous thromboembolism (VTE). ° °What do you need to know about xarelto® ? °Take your Xarelto® ONCE DAILY at the same time every day. °You may take it either with or without food. ° °If you have difficulty swallowing the tablet whole, you may crush it and mix in applesauce just prior to taking your dose. ° °Take Xarelto® exactly as prescribed by your doctor and DO NOT stop taking Xarelto® without talking to the doctor who prescribed the medication.  Stopping without other VTE prevention medication to take the place of Xarelto® may increase your risk of developing a clot. ° °After discharge, you should have regular check-up appointments with your healthcare provider that is prescribing your Xarelto®.   ° °What do you do if you miss a dose? °If you miss a dose, take it as soon as you remember on the same day then continue your regularly scheduled once daily regimen the next day. Do not take two doses of Xarelto® on the same day.  ° °Important Safety Information °A possible side effect of Xarelto® is bleeding. You should call your healthcare provider right away if you experience any of the following: °? Bleeding from an injury or your nose that does not stop. °? Unusual colored urine (red or dark brown) or unusual colored stools (red or black). °? Unusual bruising for unknown reasons. °? A serious fall or if you hit your head (even if there is no bleeding). ° °Some medicines may interact with Xarelto® and might increase your risk of bleeding while on Xarelto®. To help avoid  this, consult your healthcare provider or pharmacist prior to using any new prescription or non-prescription medications, including herbals, vitamins, non-steroidal anti-inflammatory drugs (NSAIDs) and supplements. ° °This website has more information on Xarelto®: www.xarelto.com. ° ° ° °

## 2014-02-16 NOTE — Progress Notes (Signed)
Patient for discharge to SNF, bed at [facility] today. Plans confirmed with patient who is in agreement. SNF is prepared for patient and we will  plan transfer via [facility].    Charlene Brooke, MSW Clinical Social Worker [phone]

## 2014-02-16 NOTE — Progress Notes (Signed)
Patient ID: Katherine Lawrence, female   DOB: 10-05-1955, 58 y.o.   MRN: [id number] PATIENT ID: Katherine Lawrence        MRN:  [id number]          DOB/AGE: 31-Oct-1955 / 58 y.o.    Katherine Fears, MD   Katherine Borg, PA-C [address] [place], [place]  [address]                             [phone]   PROGRESS NOTE  Subjective:  negative for Chest Pain  negative for Shortness of Breath  negative for Nausea/Vomiting   negative for Calf Pain    Tolerating Diet: yes         Patient reports pain as moderate.     Comfortable with pain meds, good effort in PT  Objective: Vital signs in last 24 hours:   Patient Vitals for the past 24 hrs:  BP Temp Temp src Pulse Resp SpO2  02/16/14 1333 151/65 mmHg 99.7 F (37.6 C) Oral 93 18 99 %  02/16/14 0510 151/72 mmHg 98.8 F (37.1 C) Oral 89 18 95 %  02/16/14 0400 - - - - 16 94 %  02/16/14 0106 143/74 mmHg 99.1 F (37.3 C) Oral 90 18 93 %  02/16/14 0000 - - - - 18 95 %  02/15/14 2229 - 98.4 F (36.9 C) - 88 17 94 %  02/15/14 2045 156/68 mmHg - - - - -  02/15/14 1957 - - - - 17 93 %  02/15/14 1600 - - - - 18 94 %      Intake/Output from previous day:   09/02 0701 - 09/03 0700 In: 2732.5 [P.O.:745; I.V.:1787.5] Out: 2000 [Urine:1850; Drains:150]   Intake/Output this shift:   09/03 0701 - 09/03 1900 In: 120 [P.O.:120] Out: -    Intake/Output     09/02 0701 - 09/03 0700 09/03 0701 - 09/04 0700   P.O. 745 120   I.V. (mL/kg) 1787.5 (16.4)    IV Piggyback 200    Total Intake(mL/kg) 2732.5 (25.1) 120 (1.1)   Urine (mL/kg/hr) 1850 (0.7)    Drains 150 (0.1)    Total Output 2000     Net +732.5 +120        Urine Occurrence 2 x 1 x      LABORATORY DATA:  Recent Labs  02/15/14 0715 02/16/14 0657  WBC 9.5 10.5  HGB 11.1* 10.8*  HCT 36.5 34.8*  PLT 193 147*    Recent Labs  02/15/14 0715 02/16/14 0657  NA 141 142  K 4.0 4.2  CL 103 101  CO2 28 26  BUN 18 9  CREATININE 0.70 0.40*  GLUCOSE 118* 118*  CALCIUM 7.8* 7.9*     Lab Results  Component Value Date   INR 1.01 02/07/2014    Recent Radiographic Studies :  Chest 2 View  02/07/2014   CLINICAL DATA:  Pre operative respiratory exam. Arthritis of the knee.  EXAM: CHEST  2 VIEW  COMPARISON:  07/28/2011 and 03/31/2007 and chest CT dated 09/10/2006  FINDINGS: Heart size and pulmonary vascularity are normal and the lungs are clear. The patient has prominent right and left pericardial fat pads. No effusions. No significant osseous abnormality.  IMPRESSION: No active cardiopulmonary disease.   Electronically Signed   By: Rozetta Nunnery M.D.   On: 02/07/2014 20:52   Dg Chest Port 1 View  02/14/2014   CLINICAL DATA:  Status post knee surgery. Abnormal physical examination.  EXAM: PORTABLE CHEST - 1 VIEW  COMPARISON:  PA and lateral chest 02/07/2014 and 07/28/2011.  FINDINGS:  The left costophrenic angle is just off the margin of the film. Heart size is upper normal with vascular congestion. No consolidative process or pneumothorax.  IMPRESSION: Pulmonary vascular congestion without frank edema   Electronically Signed   By: Inge Rise M.D.   On: 02/14/2014 12:05     Examination:  General appearance: alert, cooperative and no distress  Wound Exam: clean, dry, intact   Drainage:  Scant/small amount Serosanguinous exudate in hemovac-D/C'd  Motor Exam: EHL, FHL, Anterior Tibial and Posterior Tibial Intact  Sensory Exam: Superficial Peroneal, Deep Peroneal and Tibial normal  Vascular Exam: Normal  Assessment:    2 Days Post-Op  Procedure(s) (LRB): TOTAL KNEE ARTHROPLASTY (Left)  ADDITIONAL DIAGNOSIS:  Active Problems:   S/P total knee replacement using cement  hypotension-resolved   Plan: Physical Therapy as ordered Partial Weight Bearing @ 50% (PWB)  DVT Prophylaxis:  Xarelto and TED hose  DISCHARGE PLAN: Skilled Nursing Facility/Rehab  DISCHARGE NEEDS: HHPT, CPM, Walker and 3-in-1 comode seat   voiding without difficulty,ready for D/C to  [employer], [place] W  02/16/2014 3:02 PM

## 2014-02-16 NOTE — Clinical Social Work Note (Signed)
Pt has STR/SNF bed available at [facility] awaiting medical discharge.  MD aware.  SNF updated.  Nonnie Done, [place] [phone]  Psychiatric & Orthopedics (5N 1-16) Clinical Social Worker

## 2014-02-21 ENCOUNTER — Non-Acute Institutional Stay (SKILLED_NURSING_FACILITY): Payer: 59 | Admitting: Internal Medicine

## 2014-02-21 DIAGNOSIS — M1710 Unilateral primary osteoarthritis, unspecified knee: Secondary | ICD-10-CM

## 2014-02-21 DIAGNOSIS — E119 Type 2 diabetes mellitus without complications: Secondary | ICD-10-CM

## 2014-02-21 DIAGNOSIS — I10 Essential (primary) hypertension: Secondary | ICD-10-CM

## 2014-02-21 DIAGNOSIS — M1712 Unilateral primary osteoarthritis, left knee: Secondary | ICD-10-CM

## 2014-02-21 DIAGNOSIS — D62 Acute posthemorrhagic anemia: Secondary | ICD-10-CM

## 2014-02-21 NOTE — Progress Notes (Signed)
HISTORY & PHYSICAL  DATE: 02/21/2014   FACILITY: [facility]  LEVEL OF CARE: SNF (31)  ALLERGIES:  No Known Allergies  CHIEF COMPLAINT:  Manage left knee osteoarthritis, diabetes mellitus and hypertension  HISTORY OF PRESENT ILLNESS: Patient is a 58 year old Caucasian female.  KNEE OSTEOARTHRITIS: Patient had a history of pain and functional disability in the knee due to end-stage osteoarthritis and has failed nonsurgical conservative treatments. Patient had worsening of pain with activity and weight bearing, pain that interfered with activities of daily living & pain with passive range of motion. Therefore patient underwent total knee arthroplasty and tolerated the procedure well. Patient is admitted to this facility for sort short-term rehabilitation. Patient denies knee pain.  DM:pt's DM remains stable.  Pt denies polyuria, polydipsia, polyphagia, changes in vision or hypoglycemic episodes.  No complications noted from the medication presently being used.  Last hemoglobin A1c is: Not available.  HTN: Pt 's HTN remains stable.  Denies CP, sob, DOE, pedal edema, headaches, dizziness or visual disturbances.  No complications from the medications currently being used.  Last BP : 146/62.  PAST MEDICAL HISTORY :  Past Medical History  Diagnosis Date  . Diabetes type 2, controlled   . HYPERCHOLESTEROLEMIA   . DEPRESSION   . HYPERTENSION   . 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 Osteoarthritis   . Postsurgical menopause   . Ovarian cyst   . Heart murmur   . Asthma   . Pneumonia 06/2003    PAST SURGICAL HISTORY: Past Surgical History  Procedure Laterality Date  . Tonsillectomy    . Cesarean section    . Dilation and curettage of uterus    . Cervical cone biopsy  1996  . Total hip arthroplasty  2008  . Abdominal hysterectomy  08/22/2011  . Joint replacement Right 2008  . Colonoscopy w/ polypectomy      x3   . 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 Total knee arthroplasty Left 02/14/2014    Procedure: TOTAL KNEE ARTHROPLASTY;  Surgeon: Garald Balding, MD;  Location: [facility];  Service: Orthopedics;  Laterality: Left;    SOCIAL HISTORY:  reports that she quit smoking about 3 years ago. Her smoking use included Cigarettes. She has a .25 pack-year smoking history. She has never used smokeless tobacco. She reports that she does not drink alcohol or use illicit drugs.  FAMILY HISTORY:  Family History  Problem Relation Age of Onset  . Pancreatic cancer Mother   . Hypertension Father   . Heart disease Father     CURRENT MEDICATIONS: Reviewed per MAR/see medication list  REVIEW OF SYSTEMS:  See HPI otherwise 14 point ROS is negative.  PHYSICAL EXAMINATION  VS:  See VS section  GENERAL: no acute distress, obese body habitus EYES: conjunctivae normal, sclerae normal, normal eye lids MOUTH/THROAT: lips without lesions,no lesions in the mouth,tongue is without lesions,uvula elevates in midline NECK: supple, trachea midline, no neck masses, no thyroid tenderness, no thyromegaly LYMPHATICS: no LAN in the neck, no supraclavicular LAN RESPIRATORY: breathing is even & unlabored, BS CTAB CARDIAC: RRR, no murmur,no extra heart sounds, no edema GI:  ABDOMEN: abdomen soft, normal BS, no masses, no tenderness  LIVER/SPLEEN: no hepatomegaly, no splenomegaly MUSCULOSKELETAL: HEAD: normal to inspection  EXTREMITIES: LEFT UPPER EXTREMITY: full range of motion, normal strength & tone RIGHT UPPER EXTREMITY:  full range  of motion, normal strength & tone LEFT LOWER EXTREMITY: range of motion not tested due to surgery, normal strength & tone RIGHT LOWER EXTREMITY:  full range of motion, normal strength & tone PSYCHIATRIC: the patient is alert & oriented to person, affect & behavior appropriate  LABS/RADIOLOGY:  Labs reviewed: Basic Metabolic Panel:  Recent Labs  02/07/14 1624  02/15/14 0715 02/16/14 0657  NA 140 141 142  K 3.7 4.0 4.2  CL 101 103 101  CO2 25 28 26   GLUCOSE 103* 118* 118*  BUN 15 18 9   CREATININE 0.66 0.70 0.40*  CALCIUM 9.8 7.8* 7.9*   Liver Function Tests:  Recent Labs  06/22/13 1137 02/07/14 1624  AST 20 13  ALT 18 11  ALKPHOS 59 61  BILITOT 0.9 0.4  PROT 7.2 7.1  ALBUMIN 4.5 4.1   CBC:  Recent Labs  02/07/14 1624 02/15/14 0715 02/16/14 0657  WBC 9.1 9.5 10.5  NEUTROABS 6.0  --   --   HGB 14.6 11.1* 10.8*  HCT 43.9 36.5 34.8*  MCV 88.3 92.9 94.3  PLT 224 193 147*   Lipid Panel:  Recent Labs  06/22/13 1137  HDL 37.90*   CBG:  Recent Labs  02/16/14 0712 02/16/14 1204 02/16/14 1718  GLUCAP 136* 137* 111*   PORTABLE CHEST - 1 VIEW   COMPARISON:  PA and lateral chest 02/07/2014 and 07/28/2011.   FINDINGS:   The left costophrenic angle is just off the margin of the film. Heart size is upper normal with vascular congestion. No consolidative process or pneumothorax.   IMPRESSION: Pulmonary vascular congestion without frank edema    ASSESSMENT/PLAN:  Left knee osteoarthritis-status post total knee arthroplasty. Continue rehabilitation. Hypertension-blood pressure borderline. Will monitor. Diabetes mellitus-continue metformin Acute blood loss anemia-check hemoglobin Hyperlipidemia-continue Lipitor Check CBC  I have reviewed patient's medical records received at admission/from hospitalization.  CPT CODE: 05697  Whitney Bingaman Y Lesean [place], [place] [phone]

## 2014-02-22 ENCOUNTER — Non-Acute Institutional Stay (SKILLED_NURSING_FACILITY): Payer: 59 | Admitting: Adult Health

## 2014-02-22 ENCOUNTER — Encounter: Payer: Self-pay | Admitting: Adult Health

## 2014-02-22 DIAGNOSIS — M1710 Unilateral primary osteoarthritis, unspecified knee: Secondary | ICD-10-CM

## 2014-02-22 DIAGNOSIS — E119 Type 2 diabetes mellitus without complications: Secondary | ICD-10-CM

## 2014-02-22 DIAGNOSIS — I10 Essential (primary) hypertension: Secondary | ICD-10-CM

## 2014-02-22 DIAGNOSIS — E7800 Pure hypercholesterolemia, unspecified: Secondary | ICD-10-CM

## 2014-02-22 DIAGNOSIS — M1712 Unilateral primary osteoarthritis, left knee: Secondary | ICD-10-CM

## 2014-02-22 DIAGNOSIS — F3289 Other specified depressive episodes: Secondary | ICD-10-CM

## 2014-02-22 DIAGNOSIS — F329 Major depressive disorder, single episode, unspecified: Secondary | ICD-10-CM

## 2014-02-22 NOTE — Progress Notes (Signed)
Patient ID: Katherine Lawrence, female   DOB: March 12, 1956, 58 y.o.   MRN: [id number]              PROGRESS NOTE  DATE: 02/22/2014   FACILITY: [facility]  LEVEL OF CARE: SNF (31)  Acute Visit  CHIEF COMPLAINT:  Discharge Notes  HISTORY OF PRESENT ILLNESS: This is a 58 year old female who is for discharge home with Home health PT and OT. DME: 3-in-1 commode. She has been admitted to [facility] on 02/16/14 from [facility] with Osteoarthritis S/P Left total Knee Arthroplasty. Patient was admitted to this facility for short-term rehabilitation after the patient's recent hospitalization.  Patient has completed SNF rehabilitation and therapy has cleared the patient for discharge.  Reassessment of ongoing problem(s):  HTN: Pt 's HTN remains stable.  Denies CP, sob, DOE, pedal edema, headaches, dizziness or visual disturbances.  No complications from the medications currently being used.  Last BP : 133/73  DM:pt's DM remains stable.  Pt denies polyuria, polydipsia, polyphagia, changes in vision or hypoglycemic episodes.  No complications noted from the medication presently being used.   9/15 hemoglobin A1c is: 6.7  DEPRESSION: The depression remains stable. Patient denies ongoing feelings of sadness, insomnia, anedhonia or lack of appetite. No complications reported from the medications currently being used. Staff do not report behavioral problems.  PAST MEDICAL HISTORY : Reviewed.  No changes/see problem list  CURRENT MEDICATIONS: Reviewed per MAR/see medication list  REVIEW OF SYSTEMS:  GENERAL: no change in appetite, no fatigue, no weight changes, no fever, chills or weakness RESPIRATORY: no cough, SOB, DOE, wheezing, hemoptysis CARDIAC: no chest pain, edema or palpitations GI: no abdominal pain, diarrhea, constipation, heart burn, nausea or vomiting  PHYSICAL EXAMINATION  GENERAL: no acute distress, normal body habitus SKIN:  Left knee surgical wound is dry, no  redness EYES: conjunctivae normal, sclerae normal, normal eye lids NECK: supple, trachea midline, no neck masses, no thyroid tenderness, no thyromegaly LYMPHATICS: no LAN in the neck, no supraclavicular LAN RESPIRATORY: breathing is even & unlabored, BS CTAB CARDIAC: RRR, no murmur,no extra heart sounds, no edema GI: abdomen soft, normal BS, no masses, no tenderness, no hepatomegaly, no splenomegaly EXTREMITIES:  Able to move all 4 extremities PSYCHIATRIC: the patient is alert & oriented to person, affect & behavior appropriate  LABS/RADIOLOGY: Labs reviewed: Basic Metabolic Panel:  Recent Labs  02/07/14 1624 02/15/14 0715 02/16/14 0657  NA 140 141 142  K 3.7 4.0 4.2  CL 101 103 101  CO2 25 28 26   GLUCOSE 103* 118* 118*  BUN 15 18 9   CREATININE 0.66 0.70 0.40*  CALCIUM 9.8 7.8* 7.9*   Liver Function Tests:  Recent Labs  06/22/13 1137 02/07/14 1624  AST 20 13  ALT 18 11  ALKPHOS 59 61  BILITOT 0.9 0.4  PROT 7.2 7.1  ALBUMIN 4.5 4.1   CBC:  Recent Labs  02/07/14 1624 02/15/14 0715 02/16/14 0657  WBC 9.1 9.5 10.5  NEUTROABS 6.0  --   --   HGB 14.6 11.1* 10.8*  HCT 43.9 36.5 34.8*  MCV 88.3 92.9 94.3  PLT 224 193 147*   Lipid Panel:  Recent Labs  06/22/13 1137  HDL 37.90*   CBG:  Recent Labs  02/16/14 0712 02/16/14 1204 02/16/14 1718  GLUCAP 136* 137* 111*    EXAM: CHEST  2 VIEW   COMPARISON:  07/28/2011 and 03/31/2007 and chest CT dated 09/10/2006   FINDINGS: Heart size and pulmonary vascularity  are normal and the lungs are clear. The patient has prominent right and left pericardial fat pads. No effusions. No significant osseous abnormality.   IMPRESSION: No active cardiopulmonary disease. EXAM: PORTABLE CHEST - 1 VIEW   COMPARISON:  PA and lateral chest 02/07/2014 and 07/28/2011.   FINDINGS:   The left costophrenic angle is just off the margin of the film. Heart size is upper normal with vascular congestion. No consolidative  process or pneumothorax.   IMPRESSION: Pulmonary vascular congestion without frank edema   ASSESSMENT/PLAN:  Osteoarthritis status post left total knee arthroplasty - for home health PT and OT Diabetes mellitus, type II - well controlled; continue Glucophage Depression - continue Celexa Hyperlipidemia - continue Lipitor Hypertension - well controlled; continue Hyzaar   I have filled out patient's discharge paperwork and written prescriptions.  Patient will receive home health PT and OT.  DME provided: 3 in 1 commode  Total discharge time: Greater than 30 minutes  Discharge time involved coordination of the discharge process with social worker, nursing staff and therapy department. Medical justification for home health services/DME verified.  CPT CODE: 59563  Seth Bake - NP [facility] [phone]

## 2014-03-24 ENCOUNTER — Other Ambulatory Visit (INDEPENDENT_AMBULATORY_CARE_PROVIDER_SITE_OTHER): Payer: 59

## 2014-03-24 ENCOUNTER — Encounter: Payer: Self-pay | Admitting: Internal Medicine

## 2014-03-24 ENCOUNTER — Ambulatory Visit (INDEPENDENT_AMBULATORY_CARE_PROVIDER_SITE_OTHER): Payer: 59 | Admitting: Internal Medicine

## 2014-03-24 VITALS — BP 110/70 | HR 84 | Temp 98.7°F | Resp 13 | Wt 228.0 lb

## 2014-03-24 DIAGNOSIS — H9392 Unspecified disorder of left ear: Secondary | ICD-10-CM

## 2014-03-24 DIAGNOSIS — D62 Acute posthemorrhagic anemia: Secondary | ICD-10-CM

## 2014-03-24 DIAGNOSIS — Z23 Encounter for immunization: Secondary | ICD-10-CM

## 2014-03-24 DIAGNOSIS — R42 Dizziness and giddiness: Secondary | ICD-10-CM

## 2014-03-24 LAB — CBC WITH DIFFERENTIAL/PLATELET
BASOS PCT: 0.5 % (ref 0.0–3.0)
Basophils Absolute: 0.1 10*3/uL (ref 0.0–0.1)
EOS PCT: 2.7 % (ref 0.0–5.0)
Eosinophils Absolute: 0.3 10*3/uL (ref 0.0–0.7)
HCT: 42.7 % (ref 36.0–46.0)
Hemoglobin: 13.6 g/dL (ref 12.0–15.0)
Lymphocytes Relative: 30.1 % (ref 12.0–46.0)
Lymphs Abs: 3.5 10*3/uL (ref 0.7–4.0)
MCHC: 32 g/dL (ref 30.0–36.0)
MCV: 88.3 fl (ref 78.0–100.0)
MONOS PCT: 4.7 % (ref 3.0–12.0)
Monocytes Absolute: 0.6 10*3/uL (ref 0.1–1.0)
NEUTROS PCT: 62 % (ref 43.0–77.0)
Neutro Abs: 7.3 10*3/uL (ref 1.4–7.7)
PLATELETS: 344 10*3/uL (ref 150.0–400.0)
RBC: 4.84 Mil/uL (ref 3.87–5.11)
RDW: 14.9 % (ref 11.5–15.5)
WBC: 11.8 10*3/uL — AB (ref 4.0–10.5)

## 2014-03-24 NOTE — Patient Instructions (Signed)
Your next office appointment will be determined based upon review of your pending labs . Those instructions will be transmitted to you through My Chart  Followup as needed for your acute issue. Please report any significant change in your symptoms. Minimal Blood Pressure Goal= AVERAGE < 140/90;  Ideal is an AVERAGE < 135/85. This AVERAGE should be calculated from @ least 5-7 BP readings taken @ different times of day on different days of week. You should not respond to isolated BP readings , but rather the AVERAGE for that week .Please bring your  blood pressure cuff to office visits to verify that it is reliable.It  can also be checked against the blood pressure device at the pharmacy. Finger or wrist cuffs are not dependable; an arm cuff is.If BP remains low Losartan HCT could be decreased to 1/2 pill daily with ongoing BP monitor. The serotonin reuptake inhibitor (citalopram) can be decreased by half dose to minimize drug to drug interactions

## 2014-03-24 NOTE — Progress Notes (Signed)
Pre visit review using our clinic review tool, if applicable. No additional management support is needed unless otherwise documented below in the visit note. 

## 2014-03-24 NOTE — Progress Notes (Signed)
Subjective:    Patient ID: Katherine Lawrence, female    DOB: 1955-07-28, 58 y.o.   MRN: [id number]  HPI   Her symptoms began 03/17/49 while sitting on a couch as a sensation of falling forward. She laid back on the couch and the symptoms resolved after 30-60 seconds  She's had similar episodes since then ;turning over in bed or looking up overhead. The symptoms have been intermittent and less severe. The subsequent events last several seconds.  There is no frank vertigo associated with these.  She has lost from 260 pounds down to 228 pounds since 12/14 purposefully. Post knee  surgery 9/1 she's lost 20 pounds of that weight. Postoperatively she did exhibit anemia  She questions whether her present medications may be too strong in view of weight loss.  Her blood pressure was 120/80 checked by the Physical Therapist who came to the house today  She is not checking her sugars but her A1c was 6.7 last month.  She denies any cardiac or neurologic prodrome prior to the events.      Review of Systems  Denied were any change in heart rhythm or rate prior to the event. There was no associated chest pain or shortness of breath .  Also specifically denied prior to the episode were headache, limb weakness, tingling, or numbness. No seizure activity noted.  She denies any blurred vision, double vision, loss of vision  She's had no upper  respiratory tract symptoms although she does have some ear fullness in the Fall and extrinsic symptoms in the Spring. These are not active at this time but she does describe some crackling in the right ear.  She has no numbness, tingling, weakness in extremities.        Objective:   Physical Exam  Positive or pertinent findings include: She limps on the left leg and walks with a cane. She is unable to get on the exam table by herself The tuning fork exam  lateralization to the right. Both tympanic membranes are somewhat dull with decreased light  reflex Abdomen slightly protuberant. Deep tendon reflexes, strength, tone normal. Left knee reflex not checked. No cranial nerve deficit.  General appearance :adequately nourished; in no distress. Eyes: No conjunctival inflammation or scleral icterus is present.FOV WNL Oral exam: Dental hygiene is good. Lips and gums are healthy appearing.There is no oropharyngeal erythema or exudate noted.  Heart:  Normal rate and regular rhythm. S1 and S2 normal without gallop, murmur, click, rub or other extra sounds   Lungs:Chest clear to auscultation; no wheezes, rhonchi,rales ,or rubs present.No increased work of breathing.  Abdomen: bowel sounds normal, soft and non-tender without masses, organomegaly or hernias noted.  No guarding or rebound. Vascular : all pulses equal ; no bruits present. Skin:Warm & dry.  Intact without suspicious lesions or rashes ; no jaundice or tenting Lymphatic: No lymphadenopathy is noted about the head, neck, axilla .            Assessment & Plan:  #1 dizziness as a sensation of falling forward without frank vertigo  #2 abnormal ear exam with dull TMs and lateralization of tuning fork to the right  #3 relative hypotension   #4 high dose of citalopram in the context of significant weight loss  #5 acute blood loss post knee surgery  Plan: She'll monitor her blood pressures. If they do remain low relatively;Losartan HCT can be decreased one half pill daily with ongoing blood pressure monitor.  Citalopram can be  decreased from 40 mg to one half pill daily.  CBC will be checked  If symptoms persist or progress; ENT consultation indicated.

## 2014-04-17 ENCOUNTER — Encounter: Payer: Self-pay | Admitting: Internal Medicine

## 2014-07-24 ENCOUNTER — Encounter: Payer: Self-pay | Admitting: Internal Medicine

## 2014-07-24 ENCOUNTER — Ambulatory Visit (INDEPENDENT_AMBULATORY_CARE_PROVIDER_SITE_OTHER): Payer: 59 | Admitting: Internal Medicine

## 2014-07-24 VITALS — BP 142/98 | HR 83 | Temp 98.4°F | Ht 64.0 in | Wt 235.0 lb

## 2014-07-24 DIAGNOSIS — F329 Major depressive disorder, single episode, unspecified: Secondary | ICD-10-CM

## 2014-07-24 DIAGNOSIS — Z23 Encounter for immunization: Secondary | ICD-10-CM

## 2014-07-24 DIAGNOSIS — E7800 Pure hypercholesterolemia, unspecified: Secondary | ICD-10-CM

## 2014-07-24 DIAGNOSIS — F320 Major depressive disorder, single episode, mild: Secondary | ICD-10-CM

## 2014-07-24 DIAGNOSIS — E119 Type 2 diabetes mellitus without complications: Secondary | ICD-10-CM

## 2014-07-24 DIAGNOSIS — F32A Depression, unspecified: Secondary | ICD-10-CM

## 2014-07-24 DIAGNOSIS — I10 Essential (primary) hypertension: Secondary | ICD-10-CM

## 2014-07-24 MED ORDER — ATORVASTATIN CALCIUM 20 MG PO TABS: 20.0000 mg | ORAL_TABLET | Freq: Every day | ORAL | Status: DC

## 2014-07-24 MED ORDER — METFORMIN HCL 500 MG PO TABS: 500.0000 mg | ORAL_TABLET | Freq: Two times a day (BID) | ORAL | Status: DC

## 2014-07-24 MED ORDER — CITALOPRAM HYDROBROMIDE 40 MG PO TABS: 20.0000 mg | ORAL_TABLET | Freq: Every day | ORAL | Status: DC

## 2014-07-24 MED ORDER — ASPIRIN EC 81 MG PO TBEC: 81.0000 mg | DELAYED_RELEASE_TABLET | Freq: Every day | ORAL | Status: DC

## 2014-07-24 MED ORDER — LOSARTAN POTASSIUM-HCTZ 100-25 MG PO TABS: 1.0000 | ORAL_TABLET | Freq: Every day | ORAL | Status: DC

## 2014-07-24 NOTE — Progress Notes (Signed)
Subjective:    Patient ID: Katherine Lawrence, female    DOB: Jun 15, 1956, 59 y.o.   MRN: [id number]  HPI  Patient here for follow up  Past Medical History  Diagnosis Date  . Diabetes type 2, controlled   . HYPERCHOLESTEROLEMIA   . DEPRESSION   . HYPERTENSION   . 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 Osteoarthritis   . Postsurgical menopause   . Ovarian cyst   . Heart murmur   . Asthma   . Pneumonia 06/2003    Review of Systems  Constitutional: Negative for fever and fatigue.  Respiratory: Negative for cough and shortness of breath.   Cardiovascular: Negative for chest pain, palpitations and leg swelling.  Neurological: Positive for dizziness (positional, esp lying down - improved since 10/15, resolved in past 30d). Negative for light-headedness and headaches.       Objective:    Physical Exam  Constitutional: She is oriented to person, place, and time. She appears well-developed and well-nourished. No distress.  Obese  Cardiovascular: Normal rate, regular rhythm and normal heart sounds.   No murmur heard. Pulmonary/Chest: Effort normal and breath sounds normal. No respiratory distress.  Musculoskeletal: She exhibits no edema.  Neurological: She is alert and oriented to person, place, and time. She has normal reflexes.    BP 142/98 mmHg  Pulse 83  Temp(Src) 98.4 F (36.9 C) (Oral)  Ht 5\' 4"  (1.626 m)  Wt 235 lb (106.595 kg)  BMI 40.32 kg/m2  SpO2 96%  LMP 07/07/2011 Wt Readings from Last 3 Encounters:  07/24/14 235 lb (106.595 kg)  03/24/14 228 lb (103.42 kg)  02/22/14 240 lb (108.863 kg)     Lab Results  Component Value Date   WBC 11.8* 03/24/2014   HGB 13.6 03/24/2014   HCT 42.7 03/24/2014   PLT 344.0 03/24/2014   GLUCOSE 118* 02/16/2014   CHOL 200 06/22/2013   TRIG 115.0 06/22/2013   HDL 37.90* 06/22/2013   LDLDIRECT 157.6 11/16/2012   LDLCALC 139* 06/22/2013   ALT 11 02/07/2014   AST 13 02/07/2014   NA  142 02/16/2014   K 4.2 02/16/2014   CL 101 02/16/2014   CREATININE 0.40* 02/16/2014   BUN 9 02/16/2014   CO2 26 02/16/2014   TSH 2.08 11/16/2012   INR 1.01 02/07/2014   HGBA1C 6.7* 02/14/2014   MICROALBUR 11.4* 11/16/2012    Chest 2 View  02/07/2014   CLINICAL DATA:  Pre operative respiratory exam. Arthritis of the knee.  EXAM: CHEST  2 VIEW  COMPARISON:  07/28/2011 and 03/31/2007 and chest CT dated 09/10/2006  FINDINGS: Heart size and pulmonary vascularity are normal and the lungs are clear. The patient has prominent right and left pericardial fat pads. No effusions. No significant osseous abnormality.  IMPRESSION: No active cardiopulmonary disease.   Electronically Signed   By: Rozetta Nunnery M.D.   On: 02/07/2014 20:52       Assessment & Plan:   Problem List Items Addressed This Visit    Diabetes type 2, controlled - Primary    Diet controlled since 2010 (or longer) Started on metformin summer 2015 when a1c >7 Has been working on weight loss since Check a1c semi annually and prn The patient is asked to make continued efforts to improve diet and exercise patterns to aid in medical management of this problem.  Lab Results  Component Value Date   HGBA1C 6.7* 02/14/2014  Relevant Medications   atorvastatin (LIPITOR) tablet   losartan-hydrochlorothiazide (HYZAAR) 100-25 MG per tablet   metFORMIN (GLUCOPHAGE) tablet   Other Relevant Orders   Hemoglobin H7S   Basic metabolic panel   Lipid panel   Microalbumin / creatinine urine ratio   Essential hypertension    BP Readings from Last 3 Encounters:  07/24/14 142/98  03/24/14 110/70  02/22/14 133/73   The current medical regimen is usually effective, but uncontrolled today. Patient is asked to get home blood pressure monitor and monitor at home. She will notify us if greater than 140/90 for additional medication/titration as needed At this time;  continue present plan and medications.  Patient education provided on  goals of blood pressure management      Relevant Medications   atorvastatin (LIPITOR) tablet   losartan-hydrochlorothiazide (HYZAAR) 100-25 MG per tablet   HYPERCHOLESTEROLEMIA    Resumed statin June 2014, 1st crestor, then atorva for cost Check now, titrate statin as needed Also The patient is asked to make an attempt to improve diet and exercise patterns to aid in medical management of this problem.        Relevant Medications   atorvastatin (LIPITOR) tablet   losartan-hydrochlorothiazide (HYZAAR) 100-25 MG per tablet   Mild depression    On SSRI therapy for years. Dose reduction October 2015 from Celexa 40 mg to 20 mg daily with stable symptoms. Patient not interested in further reduction at this time but will discuss future wean as tolerated      Relevant Medications   citalopram (CELEXA) tablet    Other Visit Diagnoses    Need for prophylactic vaccination against Streptococcus pneumoniae (pneumococcus)        Relevant Orders    Pneumococcal conjugate vaccine 13-valent        Gwendolyn Grant, MD

## 2014-07-24 NOTE — Assessment & Plan Note (Signed)
Resumed statin June 2014, 1st crestor, then atorva for cost Check now, titrate statin as needed Also The patient is asked to make an attempt to improve diet and exercise patterns to aid in medical management of this problem.

## 2014-07-24 NOTE — Progress Notes (Signed)
Pre visit review using our clinic review tool, if applicable. No additional management support is needed unless otherwise documented below in the visit note. 

## 2014-07-24 NOTE — Assessment & Plan Note (Signed)
On SSRI therapy for years. Dose reduction October 2015 from Celexa 40 mg to 20 mg daily with stable symptoms. Patient not interested in further reduction at this time but will discuss future wean as tolerated

## 2014-07-24 NOTE — Patient Instructions (Addendum)
It was good to see you today.  We have reviewed your prior records including labs and tests today  Prevnar 13, second pneumonia vaccine, administered today  Test(s) ordered today. Return in 2-4 weeks when you are fasting. Then, Your results will be released to [facility] (or called to you) after review, usually within 72hours after test completion. If any changes need to be made, you will be notified at that same time.  Medications reviewed and updated, no changes recommended at this time. Refill on medication(s) as discussed today.  Monitor your blood pressure at home and notify us if consistently greater than 140/90 for additional medication adjustment as needed  Please schedule followup in 6 months, call sooner if problems.   Hypertension Hypertension, commonly called high blood pressure, is when the force of blood pumping through your arteries is too strong. Your arteries are the blood vessels that carry blood from your heart throughout your body. A blood pressure reading consists of a higher number over a lower number, such as 110/72. The higher number (systolic) is the pressure inside your arteries when your heart pumps. The lower number (diastolic) is the pressure inside your arteries when your heart relaxes. Ideally you want your blood pressure below 120/80. Hypertension forces your heart to work harder to pump blood. Your arteries may become narrow or stiff. Having hypertension puts you at risk for heart disease, stroke, and other problems.  RISK FACTORS Some risk factors for high blood pressure are controllable. Others are not.  Risk factors you cannot control include:   Race. You may be at higher risk if you are African American.  Age. Risk increases with age.  Gender. Men are at higher risk than women before age 66 years. After age 20, women are at higher risk than men. Risk factors you can control include:  Not getting enough exercise or physical activity.  Being  overweight.  Getting too much fat, sugar, calories, or salt in your diet.  Drinking too much alcohol. SIGNS AND SYMPTOMS Hypertension does not usually cause signs or symptoms. Extremely high blood pressure (hypertensive crisis) may cause headache, anxiety, shortness of breath, and nosebleed. DIAGNOSIS  To check if you have hypertension, your health care provider will measure your blood pressure while you are seated, with your arm held at the level of your heart. It should be measured at least twice using the same arm. Certain conditions can cause a difference in blood pressure between your right and left arms. A blood pressure reading that is higher than normal on one occasion does not mean that you need treatment. If one blood pressure reading is high, ask your health care provider about having it checked again. TREATMENT  Treating high blood pressure includes making lifestyle changes and possibly taking medicine. Living a healthy lifestyle can help lower high blood pressure. You may need to change some of your habits. Lifestyle changes may include:  Following the DASH diet. This diet is high in fruits, vegetables, and whole grains. It is low in salt, red meat, and added sugars.  Getting at least 2 hours of brisk physical activity every week.  Losing weight if necessary.  Not smoking.  Limiting alcoholic beverages.  Learning ways to reduce stress. If lifestyle changes are not enough to get your blood pressure under control, your health care provider may prescribe medicine. You may need to take more than one. Work closely with your health care provider to understand the risks and benefits. HOME CARE INSTRUCTIONS  Have your  blood pressure rechecked as directed by your health care provider.   Take medicines only as directed by your health care provider. Follow the directions carefully. Blood pressure medicines must be taken as prescribed. The medicine does not work as well when you skip  doses. Skipping doses also puts you at risk for problems.   Do not smoke.   Monitor your blood pressure at home as directed by your health care provider. SEEK MEDICAL CARE IF:   You think you are having a reaction to medicines taken.  You have recurrent headaches or feel dizzy.  You have swelling in your ankles.  You have trouble with your vision. SEEK IMMEDIATE MEDICAL CARE IF:  You develop a severe headache or confusion.  You have unusual weakness, numbness, or feel faint.  You have severe chest or abdominal pain.  You vomit repeatedly.  You have trouble breathing. MAKE SURE YOU:   Understand these instructions.  Will watch your condition.  Will get help right away if you are not doing well or get worse. Document Released: 06/02/2005 Document Revised: 10/17/2013 Document Reviewed: 03/25/2013 [facility] [employer] Patient Information 2015 [place], [place]. This information is not intended to replace advice given to you by your health care provider. Make sure you discuss any questions you have with your health care provider.

## 2014-07-24 NOTE — Assessment & Plan Note (Signed)
BP Readings from Last 3 Encounters:  07/24/14 142/98  03/24/14 110/70  02/22/14 133/73   The current medical regimen is usually effective, but uncontrolled today. Patient is asked to get home blood pressure monitor and monitor at home. She will notify us if greater than 140/90 for additional medication/titration as needed At this time;  continue present plan and medications.  Patient education provided on goals of blood pressure management

## 2014-07-24 NOTE — Assessment & Plan Note (Signed)
Diet controlled since 2010 (or longer) Started on metformin summer 2015 when a1c >7 Has been working on weight loss since Check a1c semi annually and prn The patient is asked to make continued efforts to improve diet and exercise patterns to aid in medical management of this problem.  Lab Results  Component Value Date   HGBA1C 6.7* 02/14/2014

## 2014-08-04 ENCOUNTER — Other Ambulatory Visit: Payer: Self-pay | Admitting: Internal Medicine

## 2014-09-23 ENCOUNTER — Encounter: Payer: Self-pay | Admitting: Internal Medicine

## 2014-09-23 ENCOUNTER — Other Ambulatory Visit: Payer: Self-pay | Admitting: Internal Medicine

## 2014-10-04 ENCOUNTER — Ambulatory Visit (INDEPENDENT_AMBULATORY_CARE_PROVIDER_SITE_OTHER): Payer: 59 | Admitting: Psychology

## 2014-10-04 DIAGNOSIS — F4010 Social phobia, unspecified: Secondary | ICD-10-CM | POA: Diagnosis not present

## 2014-10-04 DIAGNOSIS — F900 Attention-deficit hyperactivity disorder, predominantly inattentive type: Secondary | ICD-10-CM | POA: Diagnosis not present

## 2014-10-04 DIAGNOSIS — F329 Major depressive disorder, single episode, unspecified: Secondary | ICD-10-CM

## 2014-10-25 ENCOUNTER — Other Ambulatory Visit: Payer: Self-pay

## 2014-10-25 MED ORDER — LOSARTAN POTASSIUM-HCTZ 100-25 MG PO TABS: 1.0000 | ORAL_TABLET | Freq: Every day | ORAL | Status: DC

## 2014-10-25 MED ORDER — CITALOPRAM HYDROBROMIDE 40 MG PO TABS: 20.0000 mg | ORAL_TABLET | Freq: Every day | ORAL | Status: DC

## 2014-10-27 ENCOUNTER — Ambulatory Visit (INDEPENDENT_AMBULATORY_CARE_PROVIDER_SITE_OTHER): Payer: 59 | Admitting: Psychology

## 2014-10-27 ENCOUNTER — Other Ambulatory Visit: Payer: Self-pay | Admitting: Internal Medicine

## 2014-10-27 DIAGNOSIS — F900 Attention-deficit hyperactivity disorder, predominantly inattentive type: Secondary | ICD-10-CM

## 2014-10-27 DIAGNOSIS — F4323 Adjustment disorder with mixed anxiety and depressed mood: Secondary | ICD-10-CM | POA: Diagnosis not present

## 2014-11-06 ENCOUNTER — Other Ambulatory Visit (INDEPENDENT_AMBULATORY_CARE_PROVIDER_SITE_OTHER): Payer: 59

## 2014-11-06 ENCOUNTER — Encounter: Payer: Self-pay | Admitting: Internal Medicine

## 2014-11-06 ENCOUNTER — Ambulatory Visit (INDEPENDENT_AMBULATORY_CARE_PROVIDER_SITE_OTHER): Payer: 59 | Admitting: Internal Medicine

## 2014-11-06 VITALS — BP 144/88 | HR 78 | Temp 98.3°F | Wt 236.2 lb

## 2014-11-06 DIAGNOSIS — E119 Type 2 diabetes mellitus without complications: Secondary | ICD-10-CM

## 2014-11-06 DIAGNOSIS — M609 Myositis, unspecified: Secondary | ICD-10-CM

## 2014-11-06 DIAGNOSIS — M791 Myalgia: Secondary | ICD-10-CM | POA: Diagnosis not present

## 2014-11-06 DIAGNOSIS — IMO0001 Reserved for inherently not codable concepts without codable children: Secondary | ICD-10-CM

## 2014-11-06 LAB — LIPID PANEL
CHOL/HDL RATIO: 5
CHOLESTEROL: 185 mg/dL (ref 0–200)
HDL: 37.4 mg/dL — AB (ref >39.00)
LDL CALC: 117 mg/dL — AB (ref 0–99)
NonHDL: 147.6
TRIGLYCERIDES: 151 mg/dL — AB (ref 0.0–149.0)
VLDL: 30.2 mg/dL (ref 0.0–40.0)

## 2014-11-06 LAB — BASIC METABOLIC PANEL
BUN: 14 mg/dL (ref 6–23)
CALCIUM: 10 mg/dL (ref 8.4–10.5)
CO2: 31 mEq/L (ref 19–32)
Chloride: 100 mEq/L (ref 96–112)
Creatinine, Ser: 0.58 mg/dL (ref 0.40–1.20)
GFR: 113.99 mL/min (ref >60.00)
Glucose, Bld: 95 mg/dL (ref 70–99)
Potassium: 3.6 mEq/L (ref 3.5–5.1)
Sodium: 138 mEq/L (ref 135–145)

## 2014-11-06 LAB — CK: CK TOTAL: 85 U/L (ref 7–177)

## 2014-11-06 LAB — CBC WITH DIFFERENTIAL/PLATELET
BASOS PCT: 0.7 % (ref 0.0–3.0)
Basophils Absolute: 0.1 10*3/uL (ref 0.0–0.1)
EOS PCT: 1.3 % (ref 0.0–5.0)
Eosinophils Absolute: 0.1 10*3/uL (ref 0.0–0.7)
HEMATOCRIT: 44.3 % (ref 36.0–46.0)
Hemoglobin: 14.8 g/dL (ref 12.0–15.0)
LYMPHS ABS: 2.4 10*3/uL (ref 0.7–4.0)
LYMPHS PCT: 22.3 % (ref 12.0–46.0)
MCHC: 33.4 g/dL (ref 30.0–36.0)
MCV: 83.2 fl (ref 78.0–100.0)
MONOS PCT: 5.1 % (ref 3.0–12.0)
Monocytes Absolute: 0.5 10*3/uL (ref 0.1–1.0)
NEUTROS PCT: 70.6 % (ref 43.0–77.0)
Neutro Abs: 7.5 10*3/uL (ref 1.4–7.7)
Platelets: 276 10*3/uL (ref 150.0–400.0)
RBC: 5.32 Mil/uL — ABNORMAL HIGH (ref 3.87–5.11)
RDW: 14.8 % (ref 11.5–15.5)
WBC: 10.6 10*3/uL — ABNORMAL HIGH (ref 4.0–10.5)

## 2014-11-06 LAB — MICROALBUMIN / CREATININE URINE RATIO
Creatinine,U: 105.4 mg/dL
MICROALB UR: 8.2 mg/dL — AB (ref 0.0–1.9)
Microalb Creat Ratio: 7.8 mg/g (ref 0.0–30.0)

## 2014-11-06 LAB — SEDIMENTATION RATE: Sed Rate: 17 mm/hr (ref 0–22)

## 2014-11-06 LAB — MAGNESIUM: Magnesium: 1.9 mg/dL (ref 1.5–2.5)

## 2014-11-06 LAB — HEMOGLOBIN A1C: Hgb A1c MFr Bld: 6.7 % — ABNORMAL HIGH (ref 4.6–6.5)

## 2014-11-06 NOTE — Patient Instructions (Signed)
   Please stop the atorvastatin until the lab results return.

## 2014-11-06 NOTE — Progress Notes (Signed)
Pre visit review using our clinic review tool, if applicable. No additional management support is needed unless otherwise documented below in the visit note. 

## 2014-11-06 NOTE — Progress Notes (Signed)
   Subjective:    Patient ID: Katherine Lawrence, female    DOB: 1956-03-23, 59 y.o.   MRN: [id number]  HPI   She is here with bilateral leg pain. 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 She thought that the change in her gait following surgery may have been playing a role. She describes discomfort the knees to her feet. She also has discomfort in her forearms but this is been present since 2008. She states walking or standing increase the discomfort to a level 10. The pain did seem to increase intensity as of 5/20. She has been walking with a cane since because of discomfort when she walks.  She soaked her feet in hot water last night and noted some red spots on her feet afterward.  Review of Systems Epistaxis, hemoptysis, hematuria, melena, or rectal bleeding denied. No unexplained weight loss, significant dyspepsia,dysphagia, or abdominal pain.  There is no abnormal bruising , bleeding, or difficulty stopping bleeding with injury. Last A1c on record was 6.7% in 9/15.    Objective:   Physical Exam Pertinent or positive findings include: Deep tendon reflexes are 0+ the left knee. Dorsalis pedis pulses are slightly decreased.  Homans sign is negative bilaterally.  General appearance :adequately nourished; in no distress. Eyes: No conjunctival inflammation or scleral icterus is present. Heart:  Normal rate and regular rhythm. S1 and S2 normal without gallop, murmur, click, rub or other extra sounds   Lungs:Chest clear to auscultation; no wheezes, rhonchi,rales ,or rubs present.No increased work of breathing.  Abdomen: bowel sounds normal, soft and non-tender without masses, organomegaly or hernias noted.  No guarding or rebound.  Vascular : all pulses equal ; no bruits present. Skin:Warm & dry.  Intact without suspicious lesions or rashes ; no tenting or jaundice . There are small papular,slightly erythematous type changes of  the feet. Lymphatic: No lymphadenopathy is noted about the head, neck, axilla.  Neuro: Strength, tone decreased        Assessment & Plan:  #1 bilateral leg pain in the context of statin therapy  #2 Petechial like lesions after soaking feet  Plan: See orders  & recommendations

## 2014-11-07 ENCOUNTER — Other Ambulatory Visit: Payer: Self-pay | Admitting: Internal Medicine

## 2014-11-07 ENCOUNTER — Encounter: Payer: Self-pay | Admitting: Internal Medicine

## 2014-11-07 DIAGNOSIS — M79604 Pain in right leg: Secondary | ICD-10-CM

## 2014-11-07 DIAGNOSIS — M79671 Pain in right foot: Principal | ICD-10-CM

## 2014-11-07 DIAGNOSIS — M79605 Pain in left leg: Secondary | ICD-10-CM

## 2014-11-07 DIAGNOSIS — M79672 Pain in left foot: Principal | ICD-10-CM

## 2014-11-09 ENCOUNTER — Other Ambulatory Visit: Payer: Self-pay | Admitting: Internal Medicine

## 2014-11-09 DIAGNOSIS — M79605 Pain in left leg: Secondary | ICD-10-CM

## 2014-11-09 DIAGNOSIS — M79604 Pain in right leg: Secondary | ICD-10-CM

## 2014-11-16 ENCOUNTER — Other Ambulatory Visit: Payer: Self-pay | Admitting: Internal Medicine

## 2014-11-16 DIAGNOSIS — I739 Peripheral vascular disease, unspecified: Secondary | ICD-10-CM

## 2014-11-21 ENCOUNTER — Ambulatory Visit (HOSPITAL_COMMUNITY): Payer: 59 | Attending: Internal Medicine

## 2014-11-21 DIAGNOSIS — I739 Peripheral vascular disease, unspecified: Secondary | ICD-10-CM | POA: Insufficient documentation

## 2014-12-01 ENCOUNTER — Encounter: Payer: Self-pay | Admitting: Gastroenterology

## 2015-01-22 ENCOUNTER — Encounter: Payer: 59 | Admitting: Internal Medicine

## 2015-02-04 ENCOUNTER — Other Ambulatory Visit: Payer: Self-pay | Admitting: Internal Medicine

## 2015-02-09 ENCOUNTER — Encounter: Payer: Self-pay | Admitting: Gynecology

## 2015-02-16 ENCOUNTER — Encounter: Payer: Self-pay | Admitting: Internal Medicine

## 2015-02-16 DIAGNOSIS — R413 Other amnesia: Secondary | ICD-10-CM

## 2015-03-13 ENCOUNTER — Other Ambulatory Visit: Payer: Self-pay | Admitting: Internal Medicine

## 2015-03-21 ENCOUNTER — Ambulatory Visit (INDEPENDENT_AMBULATORY_CARE_PROVIDER_SITE_OTHER): Payer: 59 | Admitting: Internal Medicine

## 2015-03-21 ENCOUNTER — Other Ambulatory Visit (INDEPENDENT_AMBULATORY_CARE_PROVIDER_SITE_OTHER): Payer: 59

## 2015-03-21 ENCOUNTER — Encounter: Payer: Self-pay | Admitting: Internal Medicine

## 2015-03-21 VITALS — BP 124/78 | HR 84 | Temp 97.8°F | Ht 64.0 in | Wt 239.8 lb

## 2015-03-21 DIAGNOSIS — Z0000 Encounter for general adult medical examination without abnormal findings: Secondary | ICD-10-CM

## 2015-03-21 DIAGNOSIS — Z114 Encounter for screening for human immunodeficiency virus [HIV]: Secondary | ICD-10-CM | POA: Diagnosis not present

## 2015-03-21 DIAGNOSIS — I10 Essential (primary) hypertension: Secondary | ICD-10-CM

## 2015-03-21 DIAGNOSIS — E119 Type 2 diabetes mellitus without complications: Secondary | ICD-10-CM | POA: Diagnosis not present

## 2015-03-21 DIAGNOSIS — Z1159 Encounter for screening for other viral diseases: Secondary | ICD-10-CM | POA: Diagnosis not present

## 2015-03-21 DIAGNOSIS — E669 Obesity, unspecified: Secondary | ICD-10-CM | POA: Insufficient documentation

## 2015-03-21 DIAGNOSIS — Z23 Encounter for immunization: Secondary | ICD-10-CM | POA: Diagnosis not present

## 2015-03-21 DIAGNOSIS — R41840 Attention and concentration deficit: Secondary | ICD-10-CM

## 2015-03-21 LAB — CBC WITH DIFFERENTIAL/PLATELET
BASOS ABS: 0.2 10*3/uL — AB (ref 0.0–0.1)
Basophils Relative: 1.5 % (ref 0.0–3.0)
Eosinophils Absolute: 0.2 10*3/uL (ref 0.0–0.7)
Eosinophils Relative: 1.8 % (ref 0.0–5.0)
HCT: 46.9 % — ABNORMAL HIGH (ref 36.0–46.0)
Hemoglobin: 15.2 g/dL — ABNORMAL HIGH (ref 12.0–15.0)
LYMPHS ABS: 3.2 10*3/uL (ref 0.7–4.0)
Lymphocytes Relative: 26 % (ref 12.0–46.0)
MCHC: 32.5 g/dL (ref 30.0–36.0)
MCV: 86.1 fl (ref 78.0–100.0)
MONOS PCT: 4.6 % (ref 3.0–12.0)
Monocytes Absolute: 0.6 10*3/uL (ref 0.1–1.0)
NEUTROS ABS: 8.2 10*3/uL — AB (ref 1.4–7.7)
NEUTROS PCT: 66.1 % (ref 43.0–77.0)
PLATELETS: 281 10*3/uL (ref 150.0–400.0)
RBC: 5.44 Mil/uL — ABNORMAL HIGH (ref 3.87–5.11)
RDW: 14.8 % (ref 11.5–15.5)
WBC: 12.5 10*3/uL — ABNORMAL HIGH (ref 4.0–10.5)

## 2015-03-21 LAB — LIPID PANEL
CHOLESTEROL: 202 mg/dL — AB (ref 0–200)
HDL: 41.5 mg/dL (ref >39.00)
LDL Cholesterol: 137 mg/dL — ABNORMAL HIGH (ref 0–99)
NonHDL: 160.27
TRIGLYCERIDES: 118 mg/dL (ref 0.0–149.0)
Total CHOL/HDL Ratio: 5
VLDL: 23.6 mg/dL (ref 0.0–40.0)

## 2015-03-21 LAB — HEMOGLOBIN A1C: Hgb A1c MFr Bld: 6.8 % — ABNORMAL HIGH (ref 4.6–6.5)

## 2015-03-21 LAB — HEPATIC FUNCTION PANEL
ALBUMIN: 4.6 g/dL (ref 3.5–5.2)
ALT: 14 U/L (ref 0–35)
AST: 11 U/L (ref 0–37)
Alkaline Phosphatase: 66 U/L (ref 39–117)
BILIRUBIN TOTAL: 0.8 mg/dL (ref 0.2–1.2)
Bilirubin, Direct: 0.1 mg/dL (ref 0.0–0.3)
TOTAL PROTEIN: 7.4 g/dL (ref 6.0–8.3)

## 2015-03-21 LAB — BASIC METABOLIC PANEL
BUN: 18 mg/dL (ref 6–23)
CALCIUM: 10 mg/dL (ref 8.4–10.5)
CO2: 31 meq/L (ref 19–32)
CREATININE: 0.72 mg/dL (ref 0.40–1.20)
Chloride: 100 mEq/L (ref 96–112)
GFR: 88.7 mL/min (ref >60.00)
GLUCOSE: 110 mg/dL — AB (ref 70–99)
Potassium: 4 mEq/L (ref 3.5–5.1)
SODIUM: 142 meq/L (ref 135–145)

## 2015-03-21 LAB — TSH: TSH: 2.29 u[IU]/mL (ref 0.35–4.50)

## 2015-03-21 MED ORDER — METFORMIN HCL 500 MG PO TABS: 500.0000 mg | ORAL_TABLET | Freq: Two times a day (BID) | ORAL | Status: DC

## 2015-03-21 NOTE — Patient Instructions (Signed)
It was good to see you today.  We have reviewed your prior records including labs and tests today  Health Maintenance reviewed - all recommended immunizations and age-appropriate screenings are up-to-date.  Test(s) ordered today. Your results will be released to [place] (or called to you) after review, usually within 72hours after test completion. If any changes need to be made, you will be notified at that same time.  Medications reviewed and updated, no changes recommended at this time.  Please schedule followup in 6 months for semiannual diabetes mellitus exam and labs, call sooner if problems.

## 2015-03-21 NOTE — Progress Notes (Signed)
Pre visit review using our clinic review tool, if applicable. No additional management support is needed unless otherwise documented below in the visit note. 

## 2015-03-21 NOTE — Assessment & Plan Note (Addendum)
BP Readings from Last 3 Encounters:  03/21/15 124/78  11/06/14 144/88  07/24/14 142/98   The current medical regimen is effective;  continue present plan and medications.

## 2015-03-21 NOTE — Assessment & Plan Note (Signed)
Ongoing since grade school Exacerbated by data processing job with [employer], difficulty with verbal instruction/convesration and name recall of people who are well known to patient ?ADD or learning disorder - evaluation of same by behavioral health April 2016 was non conclusive (report reviewed today) Pending evaluation by neurology for further neurologic evaluation prior to consideration of treatment for ADHD after maximizing therapy for other potential neurologic decline or mood disorders

## 2015-03-21 NOTE — Progress Notes (Signed)
Subjective:    Patient ID: Katherine Lawrence, female    DOB: 09-03-1955, 59 y.o.   MRN: [id number]  HPI  patient is here today for annual physical. Patient feels well and has no complaints. Also reviewed chronic medical conditions, interval events and current concerns  Past Medical History  Diagnosis Date  . Diabetes type 2, controlled ([place])   . HYPERCHOLESTEROLEMIA   . DEPRESSION   . HYPERTENSION   . 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 Osteoarthritis     hips and knees  . Postsurgical menopause   . Heart murmur   . Pneumonia 06/2003   Family History  Problem Relation Age of Onset  . Pancreatic cancer Mother 33  . Hypertension Father   . Heart disease Father    Social History  Substance Use Topics  . Smoking status: Former Smoker -- 0.25 packs/day for 1 years    Types: Cigarettes    Quit date: 07/08/2010  . Smokeless tobacco: Never Used  . Alcohol Use: No    Review of Systems  Constitutional: Negative for fatigue and unexpected weight change.  Respiratory: Negative for cough, shortness of breath and wheezing.   Cardiovascular: Negative for chest pain, palpitations and leg swelling.  Gastrointestinal: Negative for nausea, abdominal pain and diarrhea.  Neurological: Negative for dizziness, weakness, light-headedness and headaches.  Psychiatric/Behavioral: Positive for decreased concentration. Negative for dysphoric mood. The patient is not nervous/anxious.   All other systems reviewed and are negative.      Objective:    Physical Exam  Constitutional: She is oriented to person, place, and time. She appears well-developed and well-nourished. No distress.  obese  HENT:  Head: Normocephalic and atraumatic.  Right Ear: External ear normal.  Left Ear: External ear normal.  Nose: Nose normal.  Mouth/Throat: Oropharynx is clear and moist. No oropharyngeal exudate.  Eyes: EOM are normal. Pupils are equal, round, and reactive  to light. Right eye exhibits no discharge. Left eye exhibits no discharge. No scleral icterus.  Neck: Normal range of motion. Neck supple. No JVD present. No tracheal deviation present. No thyromegaly present.  Cardiovascular: Normal rate, regular rhythm, normal heart sounds and intact distal pulses.  Exam reveals no friction rub.   No murmur heard. Pulmonary/Chest: Effort normal and breath sounds normal. No respiratory distress. She has no wheezes. She has no rales. She exhibits no tenderness.  Abdominal: Soft. Bowel sounds are normal. She exhibits no distension and no mass. There is no tenderness. There is no rebound and no guarding.  Musculoskeletal: Normal range of motion.  Lymphadenopathy:    She has no cervical adenopathy.  Neurological: She is alert and oriented to person, place, and time. She has normal reflexes. No cranial nerve deficit.  Skin: Skin is warm and dry. No rash noted. She is not diaphoretic. No erythema.  Psychiatric: She has a normal mood and affect. Her behavior is normal. Judgment and thought content normal.  Nursing note and vitals reviewed.   BP 124/78 mmHg  Pulse 84  Temp(Src) 97.8 F (36.6 C) (Oral)  Ht 5\' 4"  (1.626 m)  Wt 239 lb 12 oz (108.75 kg)  BMI 41.13 kg/m2  SpO2 95%  LMP 07/07/2011 Wt Readings from Last 3 Encounters:  03/21/15 239 lb 12 oz (108.75 kg)  11/06/14 236 lb 4 oz (107.162 kg)  07/24/14 235 lb (106.595 kg)     Lab Results  Component Value Date   WBC 10.6*  11/06/2014   HGB 14.8 11/06/2014   HCT 44.3 11/06/2014   PLT 276.0 11/06/2014   GLUCOSE 95 11/06/2014   CHOL 185 11/06/2014   TRIG 151.0* 11/06/2014   HDL 37.40* 11/06/2014   LDLDIRECT 157.6 11/16/2012   LDLCALC 117* 11/06/2014   ALT 11 02/07/2014   AST 13 02/07/2014   NA 138 11/06/2014   K 3.6 11/06/2014   CL 100 11/06/2014   CREATININE 0.58 11/06/2014   BUN 14 11/06/2014   CO2 31 11/06/2014   TSH 2.08 11/16/2012   INR 1.01 02/07/2014   HGBA1C 6.7* 11/06/2014    MICROALBUR 8.2* 11/06/2014    Chest 2 View  02/07/2014   CLINICAL DATA:  Pre operative respiratory exam. Arthritis of the knee.  EXAM: CHEST  2 VIEW  COMPARISON:  07/28/2011 and 03/31/2007 and chest CT dated 09/10/2006  FINDINGS: Heart size and pulmonary vascularity are normal and the lungs are clear. The patient has prominent right and left pericardial fat pads. No effusions. No significant osseous abnormality.  IMPRESSION: No active cardiopulmonary disease.   Electronically Signed   By: Rozetta Nunnery M.D.   On: 02/07/2014 20:52       Assessment & Plan:   CPX/z00.00 - Patient has been counseled on age-appropriate routine health concerns for screening and prevention. These are reviewed and up-to-date. Immunizations are up-to-date or declined. Labs ordered and reviewed.  Problem List Items Addressed This Visit    Concentration deficit    Ongoing since grade school Exacerbated by data processing job with [employer], difficulty with verbal instruction/convesration and name recall of people who are well known to patient ?ADD or learning disorder - evaluation of same by behavioral health April 2016 was non conclusive (report reviewed today) Pending evaluation by neurology for further neurologic evaluation prior to consideration of treatment for ADHD after maximizing therapy for other potential neurologic decline or mood disorders      Diabetes type 2, controlled ([place])    Diet controlled since 2010 (or longer) Started on metformin summer 2015 when a1c >7 Has been working on weight loss since Check a1c semi annually and prn The patient is asked to make continued efforts to improve diet and exercise patterns to aid in medical management of this problem.  Lab Results  Component Value Date   HGBA1C 6.7* 11/06/2014        Relevant Medications   metFORMIN (GLUCOPHAGE) 500 MG tablet   Other Relevant Orders   Hemoglobin A1c   Essential hypertension    BP Readings from Last 3 Encounters:  03/21/15  124/78  11/06/14 144/88  07/24/14 142/98   The current medical regimen is effective;  continue present plan and medications.      Obese    Wt Readings from Last 3 Encounters:  03/21/15 239 lb 12 oz (108.75 kg)  11/06/14 236 lb 4 oz (107.162 kg)  07/24/14 235 lb (106.595 kg)   The current medical regimen is effective;  continue present plan and medications.      Relevant Medications   metFORMIN (GLUCOPHAGE) 500 MG tablet    Other Visit Diagnoses    Routine general medical examination at a health care facility    -  Primary    Relevant Orders    Basic metabolic panel    CBC with Differential/Platelet    Hepatic function panel    Lipid panel    TSH    Urinalysis, Routine w reflex microscopic (not at [facility])    Need for prophylactic vaccination and inoculation  against influenza        Relevant Orders    Flu Vaccine QUAD 36+ mos IM (Completed)        Gwendolyn Grant, MD

## 2015-03-21 NOTE — Assessment & Plan Note (Signed)
Wt Readings from Last 3 Encounters:  03/21/15 239 lb 12 oz (108.75 kg)  11/06/14 236 lb 4 oz (107.162 kg)  07/24/14 235 lb (106.595 kg)   The current medical regimen is effective;  continue present plan and medications.

## 2015-03-21 NOTE — Assessment & Plan Note (Signed)
Diet controlled since 2010 (or longer) Started on metformin summer 2015 when a1c >7 Has been working on weight loss since Check a1c semi annually and prn The patient is asked to make continued efforts to improve diet and exercise patterns to aid in medical management of this problem.  Lab Results  Component Value Date   HGBA1C 6.7* 11/06/2014

## 2015-03-22 ENCOUNTER — Other Ambulatory Visit: Payer: 59

## 2015-03-22 LAB — URINALYSIS, ROUTINE W REFLEX MICROSCOPIC
Bilirubin Urine: NEGATIVE
HGB URINE DIPSTICK: NEGATIVE
Ketones, ur: NEGATIVE
Leukocytes, UA: NEGATIVE
NITRITE: NEGATIVE
PH: 5.5 (ref 5.0–8.0)
Specific Gravity, Urine: >=1.03 — AB (ref 1.000–1.030)
TOTAL PROTEIN, URINE-UPE24: NEGATIVE
URINE GLUCOSE: NEGATIVE
UROBILINOGEN UA: 0.2 (ref 0.0–1.0)

## 2015-03-22 LAB — HEPATITIS C ANTIBODY: HCV AB: NEGATIVE

## 2015-03-22 LAB — HIV ANTIBODY (ROUTINE TESTING W REFLEX): HIV 1&2 Ab, 4th Generation: NONREACTIVE

## 2015-04-09 ENCOUNTER — Ambulatory Visit: Payer: Self-pay | Admitting: Neurology

## 2015-04-13 ENCOUNTER — Other Ambulatory Visit (INDEPENDENT_AMBULATORY_CARE_PROVIDER_SITE_OTHER): Payer: 59

## 2015-04-13 ENCOUNTER — Ambulatory Visit (INDEPENDENT_AMBULATORY_CARE_PROVIDER_SITE_OTHER): Payer: 59 | Admitting: Neurology

## 2015-04-13 ENCOUNTER — Encounter: Payer: Self-pay | Admitting: Neurology

## 2015-04-13 VITALS — BP 124/82 | HR 82 | Resp 14 | Ht 63.0 in | Wt 242.0 lb

## 2015-04-13 DIAGNOSIS — E1169 Type 2 diabetes mellitus with other specified complication: Secondary | ICD-10-CM | POA: Insufficient documentation

## 2015-04-13 DIAGNOSIS — E785 Hyperlipidemia, unspecified: Secondary | ICD-10-CM

## 2015-04-13 DIAGNOSIS — E119 Type 2 diabetes mellitus without complications: Secondary | ICD-10-CM

## 2015-04-13 DIAGNOSIS — R413 Other amnesia: Secondary | ICD-10-CM

## 2015-04-13 DIAGNOSIS — I10 Essential (primary) hypertension: Secondary | ICD-10-CM | POA: Diagnosis not present

## 2015-04-13 HISTORY — DX: Type 2 diabetes mellitus without complications: E11.9

## 2015-04-13 LAB — VITAMIN B12: Vitamin B-12: 377 pg/mL (ref 211–911)

## 2015-04-13 NOTE — Patient Instructions (Signed)
1. Bloodwork for B12 level 2. Schedule MRI brain without contrast 3. Schedule routine EEG 4. Refer for Neuropsychological evaluation at [facility] 5. Follow-up after tests

## 2015-04-13 NOTE — Progress Notes (Signed)
NEUROLOGY CONSULTATION NOTE  DEISHA STULL MRN: [id number] DOB: [birth year]  Referring provider: Dr. Gwendolyn Grant Primary care provider: Dr. Gwendolyn Grant  Reason for consult:  Memory changes, poor concentration  Dear Dr Asa Lente:  Thank you for your kind referral of Suzy Bouchard for consultation of the above symptoms. Although her history is well known to you, please allow me to reiterate it for the purpose of our medical record. Records and images were personally reviewed where available.  HISTORY OF PRESENT ILLNESS: This is a very pleasant 59 year old right-handed woman with a history of hypertension, hyperlipidemia, diabetes, presenting for evaluation of concentration difficulties and memory problems. She reports that even as a child, she was "always the one that didn't understand," she was a poor student, good at reading but could not do math, because she just could not retain information. She was in regular classes and did 3 years of college at [employer]. She noticed symptoms worsen over the years, particularly during the first couple of years when she started working. She has been at the same job for 34 years, but has always needed to ask instructions to be repeated ("I think they think I'm slow"). She would be trying to do something and things go totally like a blank screen. This happens all the time. Over the past year, she started noticing memory changes, forgetting conversations or something that she had done. She got lost driving 5 years ago on the [address] where she lives, she could not tell where she was. She lives with her husband, who has noticed similar changes, telling her she repeats herself. She has "done a few crazy things," like putting something in the fridge that did not belong there, or putting on [place] instead of her deodorant. She had chalked this up to stress at work. She has left the stove on twice in the past couple of months. She occasionally forgets her  medications but is good overall. She has always had difficulties with multitasking but has noticed this has worsened. She denies any missed bill payments.   She underwent Psychological Testing for ADHD last April 2016, records were reviewed. She had the [employer], CNS Vital Signs, Adult ADHD Self-Report Scale Symptoms Checklist, and NeuroPsych Questionnaire done, per summary, "based on results, Ms. Kreamer appears to meet the criterion for unspecified ADHD, although more significant neurological difficulty may be present. Additionally, anxiety and depressed mood were reported and may be contributing to attention and work problems, although some of the mood difficulties have lessened through medication." Recommendations included Neurological and/or Neuropsychological testing. It was noted that medication to treat ADHD may be helpful, medical treatment of attention problems may be more effective following results of neurological testing.   She denies any headaches, diplopia, dysarthria, dysphagia, neck/back pain, bowel/bladder dysfunction, anosmia, or tremors. She started having vertigo last October 2015, with a sensation of spinning like she was falling lasting 1 minute or so, more when turning her head to the left or getting out of bed. No nausea or vomiting, hearing changes. She has occasional low-pitched tinnitus. She feels her vision is blurred. She has some weakness in her left leg post-knee replacement last September 2015. No falls. She was prescribed Citalopram for bouts of crying 10 years ago, she notices that if she forgets her medication, she starts crying again. She denies any family history of dementia or similar memory problems, no history of significant head injuries or alcohol use.   Laboratory Data: Lab  Results  Component Value Date   WBC 12.5* 03/21/2015   HGB 15.2* 03/21/2015   HCT 46.9* 03/21/2015   MCV 86.1 03/21/2015   PLT 281.0 03/21/2015     Chemistry        Component Value Date/Time   NA 142 03/21/2015 1211   K 4.0 03/21/2015 1211   CL 100 03/21/2015 1211   CO2 31 03/21/2015 1211   BUN 18 03/21/2015 1211   CREATININE 0.72 03/21/2015 1211   CREATININE 0.68 07/21/2011 1119      Component Value Date/Time   CALCIUM 10.0 03/21/2015 1211   ALKPHOS 66 03/21/2015 1211   AST 11 03/21/2015 1211   ALT 14 03/21/2015 1211   BILITOT 0.8 03/21/2015 1211     Lab Results  Component Value Date   TSH 2.29 03/21/2015     PAST MEDICAL HISTORY: Past Medical History  Diagnosis Date  . Diabetes type 2, controlled ([place])   . HYPERCHOLESTEROLEMIA   . DEPRESSION   . HYPERTENSION   . 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 Osteoarthritis     hips and knees  . Postsurgical menopause   . Heart murmur   . Pneumonia 06/2003    PAST SURGICAL HISTORY: Past Surgical History  Procedure Laterality Date  . Tonsillectomy    . Cesarean section      x1  . Dilation and curettage of uterus    . Cervical cone biopsy  1996  . Total hip arthroplasty Right 2008  . Abdominal hysterectomy  08/22/2011  . Colonoscopy w/ polypectomy  12/2008    x3  . 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 aspirin EC 81 MG tablet Take 1 tablet (81 mg total) by mouth daily. 150 tablet 2  . atorvastatin (LIPITOR) 20 MG tablet Take 1 tablet (20 mg total) by mouth daily at 6 PM. 90 tablet 3  . citalopram (CELEXA) 40 MG tablet Take 0.5 tablets (20 mg total) by mouth at bedtime. 45 tablet 3  . losartan-hydrochlorothiazide (HYZAAR) 100-25 MG per tablet Take 1 tablet by mouth daily. 90 tablet 3  . metFORMIN (GLUCOPHAGE) 500 MG tablet Take 1 tablet (500  mg total) by mouth 2 (two) times daily with a meal. 180 tablet 1   No current facility-administered medications on file prior to visit.    ALLERGIES: No Known Allergies  FAMILY HISTORY: Family History  Problem Relation Age of Onset  . Pancreatic cancer Mother 57  . Hypertension Father   . Heart disease Father     SOCIAL HISTORY: Social History   Social History  . Marital Status: Married    Spouse Name: N/A  . Number of Children: 2  . Years of Education: N/A   Occupational History  . [employer]    works from home   Social History Main Topics  . Smoking status: Former Smoker -- 0.25 packs/day for 1 years    Types: Cigarettes    Quit date: 07/08/2010  . Smokeless tobacco: Never Used  . Alcohol Use: 0.0 oz/week    0 Standard drinks or equivalent per week     Comment: Rare  .  Drug Use: No  . Sexual Activity: Yes   Other Topics Concern  . Not on file   Social History Narrative   Lives with spouse; grown kids   Pt is also a Ship broker.   Regular exercise-yes    REVIEW OF SYSTEMS: Constitutional: No fevers, chills, or sweats, no generalized fatigue, change in appetite Eyes: as above Ear, nose and throat: No hearing loss, ear pain, nasal congestion, sore throat Cardiovascular: No chest pain, palpitations Respiratory:  No shortness of breath at rest or with exertion, wheezes GastrointestinaI: No nausea, vomiting, diarrhea, abdominal pain, fecal incontinence Genitourinary:  No dysuria, urinary retention or frequency Musculoskeletal:  No neck pain, back pain Integumentary: No rash, pruritus, skin lesions Neurological: as above Psychiatric: + depression,no insomnia, anxiety Endocrine: No palpitations, fatigue, diaphoresis, mood swings, change in appetite, change in weight, increased thirst Hematologic/Lymphatic:  No anemia, purpura, petechiae. Allergic/Immunologic: no itchy/runny eyes, nasal congestion, recent allergic reactions, rashes  PHYSICAL  EXAM: Filed Vitals:   04/13/15 1241  BP: 124/82  Pulse: 82  Resp: 14   General: No acute distress Head:  Normocephalic/atraumatic Eyes: Fundoscopic exam shows bilateral sharp discs, no vessel changes, exudates, or hemorrhages Neck: supple, no paraspinal tenderness, full range of motion Back: No paraspinal tenderness Heart: regular rate and rhythm Lungs: Clear to auscultation bilaterally. Vascular: No carotid bruits. Skin/Extremities: No rash, no edema Neurological Exam: Mental status: alert and oriented to person, place, and time, no dysarthria or aphasia, Fund of knowledge is appropriate.  Recent and remote memory are intact.  Attention and concentration are normal.    Able to name objects and repeat phrases.  Montreal Cognitive Assessment  04/13/2015  Visuospatial/ Executive (0/5) 3  Naming (0/3) 3  Attention: Read list of digits (0/2) 2  Attention: Read list of letters (0/1) 1  Attention: Serial 7 subtraction starting at 100 (0/3) 3  Language: Repeat phrase (0/2) 2  Language : Fluency (0/1) 0  Abstraction (0/2) 2  Delayed Recall (0/5) 2  Orientation (0/6) 6  Total 24  Adjusted Score (based on education) 24   Cranial nerves: CN I: not tested CN II: pupils equal, round and reactive to light, visual fields intact, fundi unremarkable. CN III, IV, VI:  full range of motion, no nystagmus, no ptosis CN V: facial sensation intact CN VII: upper and lower face symmetric CN VIII: hearing intact to finger rub CN IX, X: gag intact, uvula midline CN XI: sternocleidomastoid and trapezius muscles intact CN XII: tongue midline Bulk & Tone: normal, no fasciculations. Motor: 5/5 throughout with no pronator drift. Sensation: intact to light touch, cold, pin, vibration and joint position sense.  No extinction to double simultaneous stimulation.  Romberg test negative Deep Tendon Reflexes: +2 on both UE, +1 right patella, unable to elicit left patella reflex, absent ankle jerks bilaterally,  no ankle clonus Plantar responses: downgoing bilaterally Cerebellar: no incoordination on finger to nose, heel to shin. No dysdiadochokinesia Gait: narrow-based and steady, able to tandem walk adequately. Tremor: none  IMPRESSION: This is a very pleasant 59 year old right-handed woman with a history of learning difficulties as a child, presenting for evaluation of concentration deficits and memory problems. She reports things "suddenly become a blank screen." Psychological evaluation indicated unspecified ADHD, however expressed concern about possible underlying neurological condition. Her neurological exam today is non-focal, MOCA score 24/30, indicating mild cognitive impairment. We discussed different causes of memory/cognitive changes, check B12 level. MRI brain without contrast will be ordered to assess for underlying structural abnormality  and vascular load. Seizures are less likely, however with her report of "sudden blanking out," a routine EEG will also be done. She will be referred for Neuropsychological evaluation to further delineate her symptoms and assess for ADHD. She will follow-up after the tests.   Thank you for allowing me to participate in the care of this patient. Please do not hesitate to call for any questions or concerns.   Ellouise Newer, M.D.  CC: Dr. Asa Lente

## 2015-04-16 ENCOUNTER — Telehealth: Payer: Self-pay | Admitting: Family Medicine

## 2015-04-16 NOTE — Telephone Encounter (Signed)
-----   Message from Cameron Sprang, MD sent at 04/16/2015  8:49 AM EDT ----- Pls let her know B12 level is normal, thanks

## 2015-04-16 NOTE — Telephone Encounter (Signed)
Lmovm to rtn my call. 

## 2015-04-17 NOTE — Telephone Encounter (Signed)
Patient returned my call. Notified her of results. 

## 2015-04-23 ENCOUNTER — Ambulatory Visit (HOSPITAL_COMMUNITY)
Admission: RE | Admit: 2015-04-23 | Discharge: 2015-04-23 | Disposition: A | Discharge status: Home / Self Care | Payer: 59 | Source: Ambulatory Visit | Attending: Neurology | Admitting: Neurology

## 2015-04-23 DIAGNOSIS — E785 Hyperlipidemia, unspecified: Secondary | ICD-10-CM | POA: Insufficient documentation

## 2015-04-23 DIAGNOSIS — I10 Essential (primary) hypertension: Secondary | ICD-10-CM | POA: Insufficient documentation

## 2015-04-23 DIAGNOSIS — F0280 Dementia in other diseases classified elsewhere without behavioral disturbance: Secondary | ICD-10-CM | POA: Diagnosis not present

## 2015-04-23 DIAGNOSIS — R413 Other amnesia: Secondary | ICD-10-CM | POA: Diagnosis present

## 2015-04-23 DIAGNOSIS — E119 Type 2 diabetes mellitus without complications: Secondary | ICD-10-CM | POA: Diagnosis not present

## 2015-04-23 DIAGNOSIS — G319 Degenerative disease of nervous system, unspecified: Secondary | ICD-10-CM | POA: Diagnosis not present

## 2015-04-24 ENCOUNTER — Telehealth: Payer: Self-pay | Admitting: Family Medicine

## 2015-04-24 NOTE — Telephone Encounter (Signed)
Please let her know I reviewed it myself, it is not significant in any way. Neuropsych testing would be the most helpful at this time. Thanks

## 2015-04-24 NOTE — Telephone Encounter (Signed)
-----   Message from Cameron Sprang, MD sent at 04/24/2015  9:49 AM EST ----- Pls let her know I reviewed MRI brain, it is unremarkable, no evidence of tumor, stroke, or bleed. Proceed with Neuropsych as discussed. thanks

## 2015-04-24 NOTE — Telephone Encounter (Signed)
I spoke with patient and notified her of this. She is going to hold off on [facility] visit until January due the out of pocket cost up front.

## 2015-04-24 NOTE — Telephone Encounter (Signed)
Patient notified of result. 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

## 2015-05-07 ENCOUNTER — Other Ambulatory Visit: Payer: Self-pay | Admitting: Neurology

## 2015-05-08 LAB — HM DIABETES EYE EXAM

## 2015-05-17 ENCOUNTER — Telehealth: Payer: Self-pay | Admitting: Family Medicine

## 2015-05-17 NOTE — Telephone Encounter (Signed)
Called [facility] to check to see if patient had scheduled an appt with them yet. I was told that they had tried to contact patient several times with no return call. They were trying to get her before the year was out, because when they checked with her ins about coverage for their services patient had met her deductible and wouldn't have an out of pocket. Since they weren't able to get in contact with the patient they had already filled up those open slots they had. Per her insurance if the see patient after the 1st of the year she will have a $1500 out of pocket with them.  I relayed all the above to patient when I called her. I did also tell her if she still wanted to go for testing that I could get her in to [facility] for an appt this month. She told me that she was going to check with her insurance company to see what exactly the coverage was for neuropsych testing and she would call me back to let me know how she wanted to proceed. She states she still wanted to get the testing done but she has accumulated a lot of medical bills this year she just want to make sure she wouldn't have another large medical expense.

## 2015-05-22 ENCOUNTER — Encounter: Payer: Self-pay | Admitting: Internal Medicine

## 2015-08-18 ENCOUNTER — Emergency Department (HOSPITAL_COMMUNITY)
Admission: EM | Admit: 2015-08-18 | Discharge: 2015-08-18 | Disposition: A | Discharge status: Home / Self Care | Payer: 59 | Attending: Emergency Medicine | Admitting: Emergency Medicine

## 2015-08-18 ENCOUNTER — Encounter (HOSPITAL_COMMUNITY): Payer: Self-pay | Admitting: Emergency Medicine

## 2015-08-18 ENCOUNTER — Emergency Department (HOSPITAL_COMMUNITY): Payer: 59

## 2015-08-18 DIAGNOSIS — M79602 Pain in left arm: Secondary | ICD-10-CM | POA: Diagnosis present

## 2015-08-18 DIAGNOSIS — E119 Type 2 diabetes mellitus without complications: Secondary | ICD-10-CM | POA: Insufficient documentation

## 2015-08-18 DIAGNOSIS — Z7982 Long term (current) use of aspirin: Secondary | ICD-10-CM | POA: Insufficient documentation

## 2015-08-18 DIAGNOSIS — R079 Chest pain, unspecified: Secondary | ICD-10-CM | POA: Insufficient documentation

## 2015-08-18 DIAGNOSIS — Z8742 Personal history of other diseases of the female genital tract: Secondary | ICD-10-CM | POA: Diagnosis not present

## 2015-08-18 DIAGNOSIS — R011 Cardiac murmur, unspecified: Secondary | ICD-10-CM | POA: Insufficient documentation

## 2015-08-18 DIAGNOSIS — M170 Bilateral primary osteoarthritis of knee: Secondary | ICD-10-CM | POA: Insufficient documentation

## 2015-08-18 DIAGNOSIS — Z8701 Personal history of pneumonia (recurrent): Secondary | ICD-10-CM | POA: Insufficient documentation

## 2015-08-18 DIAGNOSIS — Z79899 Other long term (current) drug therapy: Secondary | ICD-10-CM | POA: Diagnosis not present

## 2015-08-18 DIAGNOSIS — E7800 Pure hypercholesterolemia, unspecified: Secondary | ICD-10-CM | POA: Insufficient documentation

## 2015-08-18 DIAGNOSIS — F329 Major depressive disorder, single episode, unspecified: Secondary | ICD-10-CM | POA: Diagnosis not present

## 2015-08-18 DIAGNOSIS — M160 Bilateral primary osteoarthritis of hip: Secondary | ICD-10-CM | POA: Diagnosis not present

## 2015-08-18 DIAGNOSIS — I10 Essential (primary) hypertension: Secondary | ICD-10-CM | POA: Insufficient documentation

## 2015-08-18 DIAGNOSIS — Z87891 Personal history of nicotine dependence: Secondary | ICD-10-CM | POA: Diagnosis not present

## 2015-08-18 LAB — I-STAT TROPONIN, ED
TROPONIN I, POC: 0 ng/mL (ref 0.00–0.08)
Troponin i, poc: 0.01 ng/mL (ref 0.00–0.08)

## 2015-08-18 LAB — CBC
HCT: 45.8 % (ref 36.0–46.0)
Hemoglobin: 14.6 g/dL (ref 12.0–15.0)
MCH: 28.7 pg (ref 26.0–34.0)
MCHC: 31.9 g/dL (ref 30.0–36.0)
MCV: 90.2 fL (ref 78.0–100.0)
PLATELETS: 223 10*3/uL (ref 150–400)
RBC: 5.08 MIL/uL (ref 3.87–5.11)
RDW: 13.9 % (ref 11.5–15.5)
WBC: 8.9 10*3/uL (ref 4.0–10.5)

## 2015-08-18 LAB — BASIC METABOLIC PANEL
ANION GAP: 8 (ref 5–15)
BUN: 19 mg/dL (ref 6–20)
CALCIUM: 9.1 mg/dL (ref 8.9–10.3)
CHLORIDE: 104 mmol/L (ref 101–111)
CO2: 27 mmol/L (ref 22–32)
Creatinine, Ser: 0.55 mg/dL (ref 0.44–1.00)
GFR calc non Af Amer: >60 mL/min (ref >60)
GLUCOSE: 124 mg/dL — AB (ref 65–99)
POTASSIUM: 4.2 mmol/L (ref 3.5–5.1)
Sodium: 139 mmol/L (ref 135–145)

## 2015-08-18 NOTE — Discharge Instructions (Signed)
Nonspecific Chest Pain  °Chest pain can be caused by many different conditions. There is always a chance that your pain could be related to something serious, such as a heart attack or a blood clot in your lungs. Chest pain can also be caused by conditions that are not life-threatening. If you have chest pain, it is very important to follow up with your health care provider. °CAUSES  °Chest pain can be caused by: °· Heartburn. °· Pneumonia or bronchitis. °· Anxiety or stress. °· Inflammation around your heart (pericarditis) or lung (pleuritis or pleurisy). °· A blood clot in your lung. °· A collapsed lung (pneumothorax). It can develop suddenly on its own (spontaneous pneumothorax) or from trauma to the chest. °· Shingles infection (varicella-zoster virus). °· Heart attack. °· Damage to the bones, muscles, and cartilage that make up your chest wall. This can include: °¨ Bruised bones due to injury. °¨ Strained muscles or cartilage due to frequent or repeated coughing or overwork. °¨ Fracture to one or more ribs. °¨ Sore cartilage due to inflammation (costochondritis). °RISK FACTORS  °Risk factors for chest pain may include: °· Activities that increase your risk for trauma or injury to your chest. °· Respiratory infections or conditions that cause frequent coughing. °· Medical conditions or overeating that can cause heartburn. °· Heart disease or family history of heart disease. °· Conditions or health behaviors that increase your risk of developing a blood clot. °· Having had chicken pox (varicella zoster). °SIGNS AND SYMPTOMS °Chest pain can feel like: °· Burning or tingling on the surface of your chest or deep in your chest. °· Crushing, pressure, aching, or squeezing pain. °· Dull or sharp pain that is worse when you move, cough, or take a deep breath. °· Pain that is also felt in your back, neck, shoulder, or arm, or pain that spreads to any of these areas. °Your chest pain may come and go, or it may stay  constant. °DIAGNOSIS °Lab tests or other studies may be needed to find the cause of your pain. Your health care provider may have you take a test called an ambulatory ECG (electrocardiogram). An ECG records your heartbeat patterns at the time the test is performed. You may also have other tests, such as: °· Transthoracic echocardiogram (TTE). During echocardiography, sound waves are used to create a picture of all of the heart structures and to look at how blood flows through your heart. °· Transesophageal echocardiogram (TEE). This is a more advanced imaging test that obtains images from inside your body. It allows your health care provider to see your heart in finer detail. °· Cardiac monitoring. This allows your health care provider to monitor your heart rate and rhythm in real time. °· Holter monitor. This is a portable device that records your heartbeat and can help to diagnose abnormal heartbeats. It allows your health care provider to track your heart activity for several days, if needed. °· Stress tests. These can be done through exercise or by taking medicine that makes your heart beat more quickly. °· Blood tests. °· Imaging tests. °TREATMENT  °Your treatment depends on what is causing your chest pain. Treatment may include: °· Medicines. These may include: °¨ Acid blockers for heartburn. °¨ Anti-inflammatory medicine. °¨ Pain medicine for inflammatory conditions. °¨ Antibiotic medicine, if an infection is present. °¨ Medicines to dissolve blood clots. °¨ Medicines to treat coronary artery disease. °· Supportive care for conditions that do not require medicines. This may include: °¨ Resting. °¨ Applying heat   or cold packs to injured areas. °¨ Limiting activities until pain decreases. °HOME CARE INSTRUCTIONS °· If you were prescribed an antibiotic medicine, finish it all even if you start to feel better. °· Avoid any activities that bring on chest pain. °· Do not use any tobacco products, including  cigarettes, chewing tobacco, or electronic cigarettes. If you need help quitting, ask your health care provider. °· Do not drink alcohol. °· Take medicines only as directed by your health care provider. °· Keep all follow-up visits as directed by your health care provider. This is important. This includes any further testing if your chest pain does not go away. °· If heartburn is the cause for your chest pain, you may be told to keep your head raised (elevated) while sleeping. This reduces the chance that acid will go from your stomach into your esophagus. °· Make lifestyle changes as directed by your health care provider. These may include: °¨ Getting regular exercise. Ask your health care provider to suggest some activities that are safe for you. °¨ Eating a heart-healthy diet. A registered dietitian can help you to learn healthy eating options. °¨ Maintaining a healthy weight. °¨ Managing diabetes, if necessary. °¨ Reducing stress. °SEEK MEDICAL CARE IF: °· Your chest pain does not go away after treatment. °· You have a rash with blisters on your chest. °· You have a fever. °SEEK IMMEDIATE MEDICAL CARE IF:  °· Your chest pain is worse. °· You have an increasing cough, or you cough up blood. °· You have severe abdominal pain. °· You have severe weakness. °· You faint. °· You have chills. °· You have sudden, unexplained chest discomfort. °· You have sudden, unexplained discomfort in your arms, back, neck, or jaw. °· You have shortness of breath at any time. °· You suddenly start to sweat, or your skin gets clammy. °· You feel nauseous or you vomit. °· You suddenly feel light-headed or dizzy. °· Your heart begins to beat quickly, or it feels like it is skipping beats. °These symptoms may represent a serious problem that is an emergency. Do not wait to see if the symptoms will go away. Get medical help right away. Call your local emergency services (911 in the [place] Do not drive yourself to the hospital. °  °This  information is not intended to replace advice given to you by your health care provider. Make sure you discuss any questions you have with your health care provider. °  °Document Released: 03/12/2005 Document Revised: 06/23/2014 Document Reviewed: 01/06/2014 °Elsevier Interactive Patient Education ©2016 [employer] ° °

## 2015-08-18 NOTE — ED Notes (Signed)
Per pt, states left arm pain radiating to left chest-started around 0930 this am-states she was taking groceries out of bag when it happened

## 2015-08-18 NOTE — ED Notes (Signed)
Bed: WA16 Expected date:  Expected time:  Means of arrival:  Comments: Hold for triage 

## 2015-08-18 NOTE — ED Provider Notes (Signed)
CSN: [id number]     Arrival date & time 08/18/15  1055 History   First MD Initiated Contact with Patient 08/18/15 1332     Chief Complaint  Patient presents with  . Arm Pain   HPI The patient presents to the emergency room for complaints of left arm pain. The patient was at the grocery store. She was lifting up a bag of groceries with her left arm when she suddenly developed pain in the left left arm radiating up towards her chest and axillary region.  Pain was sharp and lasted for a few seconds. She did feel lightheaded and nauseated but denied any diaphoresis or shortness of breath. Symptoms resolved spontaneously. Patient noticed another episode when she was home getting changed to come to the emergency room when lifting her arm again. That episode lasted for seconds and resolved again. Since that time she's had no other further episodes. She does not have chest or arm discomfort with exertion other than these episodes with arm movement. Patient denies any history of heart disease. No history of pulmonary embolism. She was concerned though she does have a family history of heart disease. Past Medical History  Diagnosis Date  . Diabetes type 2, controlled ([place])   . HYPERCHOLESTEROLEMIA   . DEPRESSION   . HYPERTENSION   . 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 Osteoarthritis     hips and knees  . Postsurgical menopause   . Heart murmur   . Pneumonia 06/2003   Past Surgical History  Procedure Laterality Date  . Tonsillectomy    . Cesarean section      x1  . Dilation and curettage of uterus    . Cervical cone biopsy  1996  . Total hip arthroplasty Right 2008  . Abdominal hysterectomy  08/22/2011  . Colonoscopy w/ polypectomy  12/2008    x3  . 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 Total knee arthroplasty Left 02/14/2014    Procedure: TOTAL KNEE ARTHROPLASTY;  Surgeon: Garald Balding, MD;  Location: [facility];  Service:  Orthopedics;  Laterality: Left;   Family History  Problem Relation Age of Onset  . Pancreatic cancer Mother 73  . Hypertension Father   . Heart disease Father    Social History  Substance Use Topics  . Smoking status: Former Smoker -- 0.25 packs/day for 1 years    Types: Cigarettes    Quit date: 07/08/2010  . Smokeless tobacco: Never Used  . Alcohol Use: 0.0 oz/week    0 Standard drinks or equivalent per week     Comment: Rare   OB History    Gravida Para Term Preterm AB TAB SAB Ectopic Multiple Living   2 2        2      Review of Systems  All other systems reviewed and are negative.     Allergies  Review of patient's allergies indicates no known allergies.  Home Medications   Prior to Admission medications   Medication Sig Start Date End Date Taking? Authorizing Provider  aspirin EC 81 MG tablet Take 1 tablet (81 mg total) by mouth daily. 07/24/14  Yes Rowe Clack, MD  Aspirin-Acetaminophen-Caffeine (GOODYS EXTRA STRENGTH PO) Take 1 packet by mouth 2 (two) times daily as needed (for pain).   Yes Historical Provider, MD  atorvastatin (LIPITOR) 20 MG tablet Take 1 tablet (20 mg total) by mouth daily at 6  PM. Patient taking differently: Take 20 mg by mouth daily.  02/05/15  Yes Rowe Clack, MD  citalopram (CELEXA) 40 MG tablet Take 0.5 tablets (20 mg total) by mouth at bedtime. Patient taking differently: Take 40 mg by mouth daily.  10/25/14  Yes Rowe Clack, MD  ibuprofen (ADVIL,MOTRIN) 200 MG tablet Take 1,200 mg by mouth every 8 (eight) hours as needed for fever, headache, mild pain, moderate pain or cramping.   Yes Historical Provider, MD  losartan-hydrochlorothiazide (HYZAAR) 100-25 MG per tablet Take 1 tablet by mouth daily. 10/25/14  Yes Rowe Clack, MD  metFORMIN (GLUCOPHAGE) 500 MG tablet Take 1 tablet (500 mg total) by mouth 2 (two) times daily with a meal. 03/21/15  Yes Rowe Clack, MD  Propylene Glycol (SYSTANE BALANCE) 0.6 % SOLN  Place 1 drop into both eyes daily as needed (for dry eyes).   Yes Historical Provider, MD   BP 153/79 mmHg  Pulse 74  Temp(Src) 97.9 F (36.6 C) (Oral)  Resp 18  SpO2 95%  LMP 07/07/2011 Physical Exam  Constitutional: She appears well-developed and well-nourished. No distress.  HENT:  Head: Normocephalic and atraumatic.  Right Ear: External ear normal.  Left Ear: External ear normal.  Eyes: Conjunctivae are normal. Right eye exhibits no discharge. Left eye exhibits no discharge. No scleral icterus.  Neck: Neck supple. No tracheal deviation present.  Cardiovascular: Normal rate, regular rhythm and intact distal pulses.   Pulmonary/Chest: Effort normal and breath sounds normal. No stridor. No respiratory distress. She has no wheezes. She has no rales.  Abdominal: Soft. Bowel sounds are normal. She exhibits no distension. There is no tenderness. There is no rebound and no guarding.  Musculoskeletal: She exhibits no edema or tenderness.  Neurological: She is alert. She has normal strength. No cranial nerve deficit (no facial droop, extraocular movements intact, no slurred speech) or sensory deficit. She exhibits normal muscle tone. She displays no seizure activity. Coordination normal.  Skin: Skin is warm and dry. No rash noted.  Psychiatric: She has a normal mood and affect.  Nursing note and vitals reviewed.   ED Course  Procedures (including critical care time) Labs Review Labs Reviewed  BASIC METABOLIC PANEL - Abnormal; Notable for the following:    Glucose, Bld 124 (*)    All other components within normal limits  CBC  I-STAT TROPOININ, ED  Randolm Idol, ED    Imaging Review Dg Chest 2 View  08/18/2015  CLINICAL DATA:  Left sided chest pain radiating down left arm onset today. H/o type 2 diabetes, pneumonia, and controlled HTN. Former smoker. EXAM: CHEST  2 VIEW COMPARISON:  02/14/14 FINDINGS: Midline trachea. Borderline cardiomegaly. Mediastinal contours otherwise within  normal limits. No pleural effusion or pneumothorax. No congestive failure. Clear lungs. IMPRESSION: Borderline cardiomegaly, without acute disease. Electronically Signed   By: Abigail Miyamoto M.D.   On: 08/18/2015 12:23   I have personally reviewed and evaluated these images and lab results as part of my medical decision-making.   EKG Interpretation   Date/Time:  Saturday August 18 2015 11:09:17 EST Ventricular Rate:  78 PR Interval:  134 QRS Duration: 91 QT Interval:  382 QTC Calculation: 435 R Axis:   -77 Text Interpretation:  Sinus rhythm Left anterior fascicular block Low  voltage, precordial leads Abnormal R-wave progression, early transition  Baseline wander in lead(s) II III aVF No significant change since last  tracing Confirmed by Burna Atlas  MD-J, Broughton Eppinger [id number]) on 08/18/2015 11:12:03 AM  MDM   Final diagnoses:  Chest pain, unspecified chest pain type    Patient's symptoms are atypical for cardiac disease. She had brief episodes of sharp pain associated with arm movement. The patient does have cardiac risk factors however.  EKG and serial cardiac enzymes are reassuring.  I doubt she is at risk for serious cardiac event associated with these symptoms.  Will have her take an aspirin daily, follow up with her PCP to consider further evaluation.  Return to the ED for recurrent symptoms    Dorie Rank, MD 08/18/15 1609

## 2015-08-21 IMAGING — CR DG CHEST 1V PORT
1 series · 1 of 1 positions shown · non-contrast
Comparison: PA and lateral chest 02/07/2014 and 07/28/2011.

CLINICAL DATA: Status post knee surgery. Abnormal physical
examination.

EXAM:
PORTABLE CHEST - 1 VIEW

[AP]
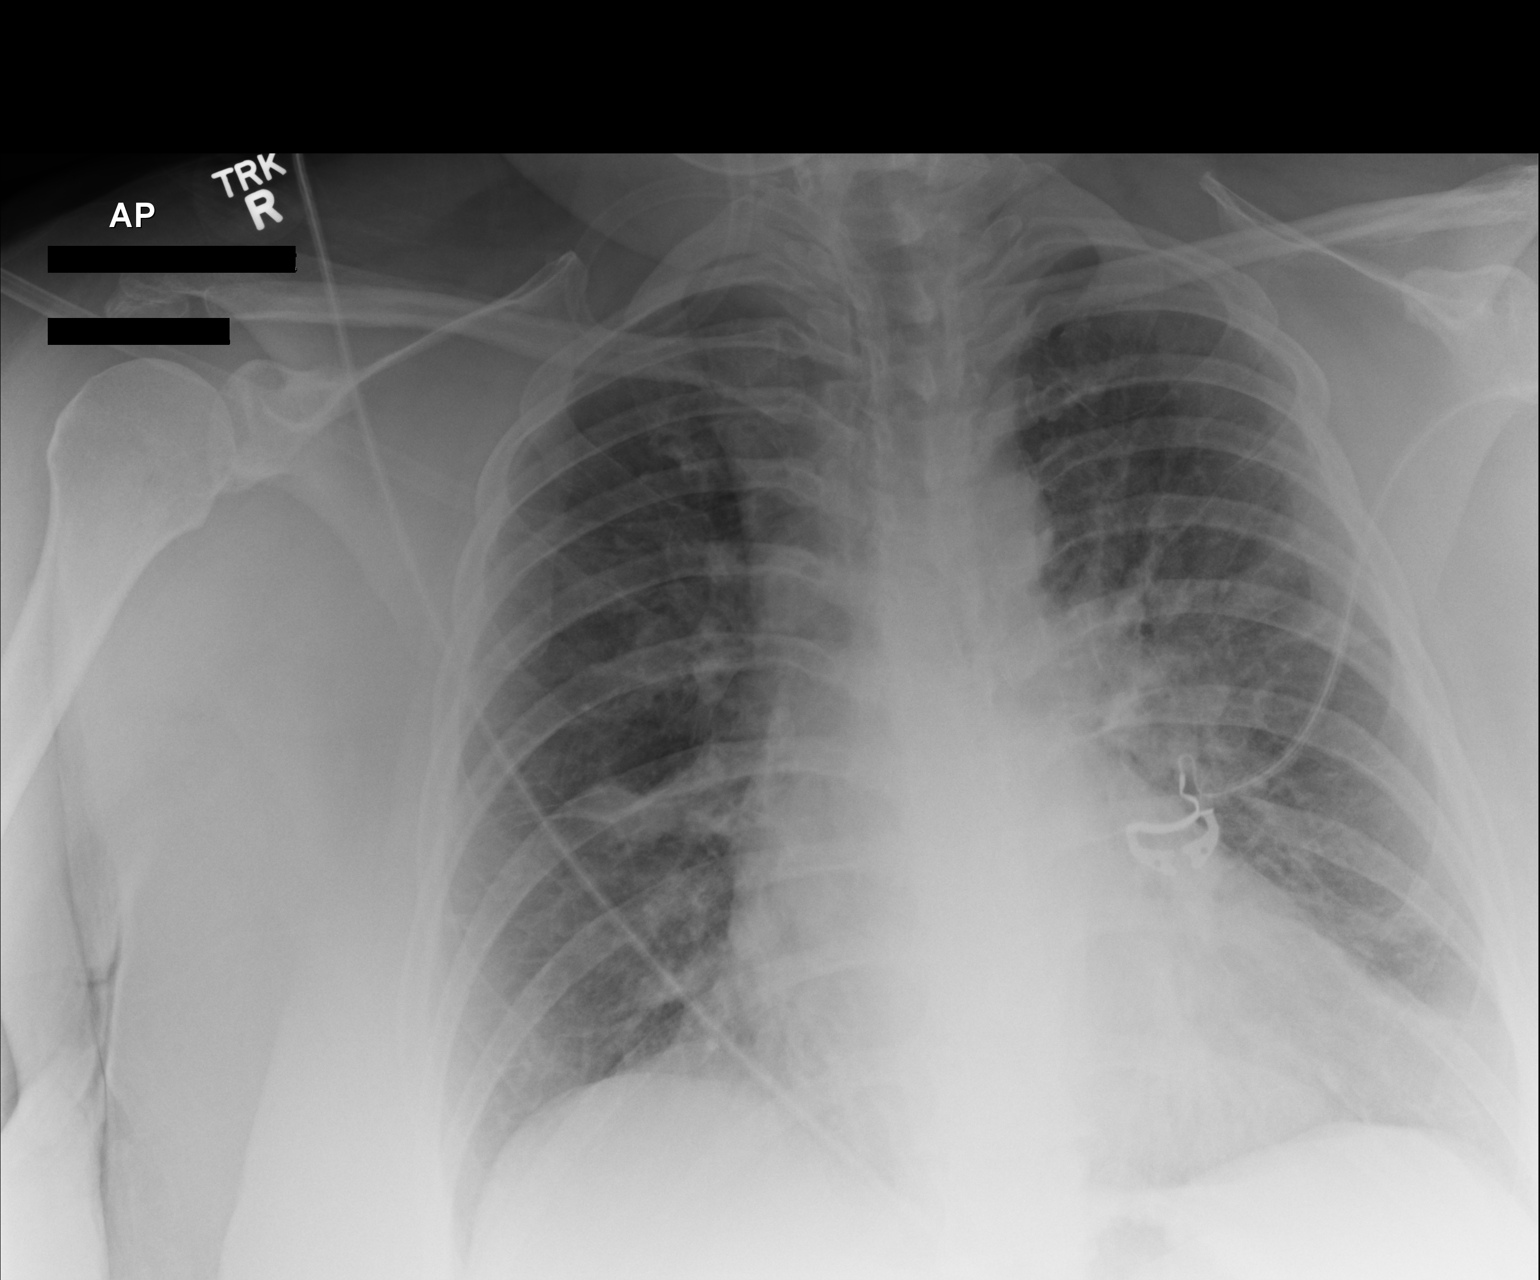

[1 of 1 positions shown; findings below may reference images not displayed]

FINDINGS: The left costophrenic angle is just off the margin of the film.
Heart size is upper normal with vascular congestion. No
consolidative process or pneumothorax.
IMPRESSION: Pulmonary vascular congestion without frank edema

## 2015-09-24 ENCOUNTER — Other Ambulatory Visit: Payer: Self-pay | Admitting: Internal Medicine

## 2015-10-01 ENCOUNTER — Other Ambulatory Visit: Payer: Self-pay | Admitting: Internal Medicine

## 2015-11-17 ENCOUNTER — Other Ambulatory Visit: Payer: Self-pay | Admitting: Internal Medicine

## 2015-12-14 ENCOUNTER — Other Ambulatory Visit: Payer: Self-pay | Admitting: Emergency Medicine

## 2015-12-14 ENCOUNTER — Other Ambulatory Visit: Payer: Self-pay | Admitting: Internal Medicine

## 2015-12-14 MED ORDER — CITALOPRAM HYDROBROMIDE 40 MG PO TABS: 40.0000 mg | ORAL_TABLET | Freq: Every day | ORAL | Status: DC | PRN

## 2015-12-14 NOTE — Telephone Encounter (Signed)
Resent Celexa RX to [facility] to verify sig.

## 2016-01-03 ENCOUNTER — Other Ambulatory Visit: Payer: Self-pay | Admitting: Internal Medicine

## 2016-01-28 ENCOUNTER — Encounter: Payer: Self-pay | Admitting: Internal Medicine

## 2016-01-28 ENCOUNTER — Ambulatory Visit (INDEPENDENT_AMBULATORY_CARE_PROVIDER_SITE_OTHER): Payer: 59 | Admitting: Internal Medicine

## 2016-01-28 VITALS — BP 162/80 | HR 90 | Temp 98.7°F | Resp 16 | Ht 63.0 in | Wt 249.0 lb

## 2016-01-28 DIAGNOSIS — E669 Obesity, unspecified: Secondary | ICD-10-CM

## 2016-01-28 DIAGNOSIS — E7800 Pure hypercholesterolemia, unspecified: Secondary | ICD-10-CM

## 2016-01-28 DIAGNOSIS — Z0000 Encounter for general adult medical examination without abnormal findings: Secondary | ICD-10-CM

## 2016-01-28 DIAGNOSIS — F320 Major depressive disorder, single episode, mild: Secondary | ICD-10-CM

## 2016-01-28 DIAGNOSIS — I10 Essential (primary) hypertension: Secondary | ICD-10-CM | POA: Diagnosis not present

## 2016-01-28 DIAGNOSIS — F32A Depression, unspecified: Secondary | ICD-10-CM

## 2016-01-28 DIAGNOSIS — E119 Type 2 diabetes mellitus without complications: Secondary | ICD-10-CM | POA: Diagnosis not present

## 2016-01-28 DIAGNOSIS — F329 Major depressive disorder, single episode, unspecified: Secondary | ICD-10-CM

## 2016-01-28 MED ORDER — AMLODIPINE BESYLATE 5 MG PO TABS: 5.0000 mg | ORAL_TABLET | Freq: Every day | ORAL | 3 refills | Status: DC

## 2016-01-28 MED ORDER — LOSARTAN POTASSIUM 100 MG PO TABS: 100.0000 mg | ORAL_TABLET | Freq: Every day | ORAL | 3 refills | Status: DC

## 2016-01-28 NOTE — Patient Instructions (Addendum)
  Test(s) ordered today. Your results will be released to [facility] (or called to you) after review, usually within 72hours after test completion. If any changes need to be made, you will be notified at that same time.  All other Health Maintenance issues reviewed.   All recommended immunizations and age-appropriate screenings are up-to-date or discussed.  No immunizations administered today.   Medications reviewed and updated.  Changes include discontinuing the hctz and start amlodipine 5 mg daily.  Your prescription(s) have been submitted to your pharmacy. Please take as directed and contact our office if you believe you are having problem(s) with the medication(s).  Please followup in 6 months

## 2016-01-28 NOTE — Progress Notes (Signed)
Subjective:    Patient ID: Katherine Lawrence, female    DOB: 07-26-1955, 60 y.o.   MRN: [id number]  HPI She is here to establish with a new pcp.  She is here for a physical exam.   She denies changes in her health and has no concerns.   Needs left hip replacement.  Takes ibuprofen for right knee pain and hip pain.  She would like to avoid the surgery for now.    Diabetes: She is taking her medication daily as prescribed. She is compliant with a diabetic diet. She is not exercising regularly. She used to swim and plans an restarting that soon.  She checks her feet daily and denies foot lesions. She is up-to-date with an ophthalmology examination.   Depression: She is taking her medication daily as prescribed. She denies any side effects from the medication. She feels her depression is well controlled and she is happy with her current dose of medication.   Hypertension: She is taking her medication daily. She is compliant with a low sodium diet.  She denies chest pain, palpitations, edema, shortness of breath and regular headaches. She is not exercising regularly.  She does not monitor her blood pressure at home.      Medications and allergies reviewed with patient and updated if appropriate.  Patient Active Problem List   Diagnosis Date Noted  . Memory loss 04/13/2015  . Hyperlipidemia 04/13/2015  . Type 2 diabetes mellitus without complication, without long-term current use of insulin ([place]) 04/13/2015  . Obese 03/21/2015  . 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 Osteoarthritis of left knee   . Concentration deficit   . Diabetes type 2, controlled ([place])   . Postsurgical menopause 07/08/2012  . Ovarian cyst 07/21/2011  . Endometrial polyp 07/21/2011  . Mild depression 10/19/2008  . HYPERCHOLESTEROLEMIA 04/03/2008  . Essential hypertension 04/03/2008  . Myalgia and myositis 04/03/2008    Current Outpatient Prescriptions on File Prior to Visit  Medication Sig  Dispense Refill  . aspirin EC 81 MG tablet Take 1 tablet (81 mg total) by mouth daily. 150 tablet 2  . Aspirin-Acetaminophen-Caffeine (GOODYS EXTRA STRENGTH PO) Take 1 packet by mouth 2 (two) times daily as needed (for pain).    Marland Kitchen atorvastatin (LIPITOR) 20 MG tablet Take 1 tablet (20 mg total) by mouth daily at 6 PM. (Patient taking differently: Take 20 mg by mouth daily. ) 90 tablet 3  . citalopram (CELEXA) 40 MG tablet Take 1 tablet by mouth  daily as needed (Must keep  appointment for further  refills) 45 tablet 0  . ibuprofen (ADVIL,MOTRIN) 200 MG tablet Take 1,200 mg by mouth every 8 (eight) hours as needed for fever, headache, mild pain, moderate pain or cramping.    Marland Kitchen losartan-hydrochlorothiazide (HYZAAR) 100-25 MG tablet TAKE 1 TABLET BY MOUTH  DAILY. 90 tablet 0  . metFORMIN (GLUCOPHAGE) 500 MG tablet Take 1 tablet (500 mg total) by mouth 2 (two) times daily with a meal. 180 tablet 1  . Propylene Glycol (SYSTANE BALANCE) 0.6 % SOLN Place 1 drop into both eyes daily as needed (for dry eyes).     No current facility-administered medications on file prior to visit.     Past Medical History:  Diagnosis Date  . CIN I (cervical intraepithelial neoplasia I) 1996   cryo...  cone of cervix  . DEPRESSION   . Diabetes type 2, controlled ([place])   . Heart murmur   . HYPERCHOLESTEROLEMIA   .  HYPERTENSION   . Osteoarthritis    hips and knees  . Pneumonia 06/2003  . Postsurgical menopause     Past Surgical History:  Procedure Laterality Date  . ABDOMINAL HYSTERECTOMY  08/22/2011  . CERVICAL CONE BIOPSY  1996  . CESAREAN SECTION     x1  . COLONOSCOPY W/ POLYPECTOMY  12/2008   x3  . DILATION AND CURETTAGE OF UTERUS    . TONSILLECTOMY    . TOTAL HIP ARTHROPLASTY Right 2008  . TOTAL KNEE ARTHROPLASTY Left 02/14/2014   DR Durward Fortes  . TOTAL KNEE ARTHROPLASTY Left 02/14/2014   Procedure: TOTAL KNEE ARTHROPLASTY;  Surgeon: Garald Balding, MD;  Location: [facility];  Service: Orthopedics;   Laterality: Left;    Social History   Social History  . Marital status: Married    Spouse name: N/A  . Number of children: 2  . Years of education: N/A   Occupational History  . [employer]    works from home   Social History Main Topics  . Smoking status: Former Smoker    Packs/day: 0.25    Years: 1.00    Types: Cigarettes    Quit date: 07/08/2010  . Smokeless tobacco: Never Used  . Alcohol use 0.0 oz/week     Comment: Rare  . Drug use: No  . Sexual activity: Yes   Other Topics Concern  . Not on file   Social History Narrative   Lives with spouse; grown kids   Pt is also a Ship broker.   Regular exercise-yes    Family History  Problem Relation Age of Onset  . Pancreatic cancer Mother 47  . Hypertension Father   . Heart disease Father     Review of Systems  Constitutional: Negative for chills and fever.  Eyes: Negative for visual disturbance.  Respiratory: Positive for cough (occ, allergies). Negative for shortness of breath and wheezing.   Cardiovascular: Negative for chest pain, palpitations and leg swelling.  Gastrointestinal: Negative for abdominal pain, blood in stool, constipation, diarrhea and nausea.       Gerd at night occasional  Genitourinary: Negative for hematuria.  Musculoskeletal: Positive for arthralgias.  Skin: Negative for rash.  Neurological: Positive for dizziness (occasionally). Negative for light-headedness and headaches.  Psychiatric/Behavioral: Positive for dysphoric mood (mild). The patient is not nervous/anxious.        Objective:   Vitals:   01/28/16 1556  BP: (!) 162/80  Pulse: 90  Resp: 16  Temp: 98.7 F (37.1 C)   Filed Weights   01/28/16 1556  Weight: 249 lb (112.9 kg)   Body mass index is 44.11 kg/m.   Physical Exam Constitutional: She appears well-developed and well-nourished. No distress.  HENT:  Head: Normocephalic and atraumatic.  Right Ear: External ear normal. Normal ear canal and  TM Left Ear: External ear normal.  Normal ear canal and TM Mouth/Throat: Oropharynx is clear and moist.  Eyes: Conjunctivae and EOM are normal.  Neck: Neck supple. No tracheal deviation present. No thyromegaly present.  No carotid bruit  Cardiovascular: Normal rate, regular rhythm and normal heart sounds.   No murmur heard.  No edema. Pulmonary/Chest: Effort normal and breath sounds normal. No respiratory distress. She has no wheezes. She has no rales.  Breast: deferred to Gyn Abdominal: Soft. She exhibits no distension. There is no tenderness.  Lymphadenopathy: She has no cervical adenopathy.  Skin: Skin is warm and dry. She is not diaphoretic.  Psychiatric: She has a normal mood and affect.  Her behavior is normal.         Assessment & Plan:   Physical exam: Screening blood work  ordered Immunizations  Up to date  Colonoscopy  Up to date  Mammogram   Up to date  Gyn   Up to date  Dexa    Done 2014 - managed by gyn Eye exams  Up to date  EKG done 2017 Exercise - not currently exercising, but plans on starting to swim - knows she needs to exercise Weight - obese, advised weight loss Skin  - no concerns Substance abuse  -  none  See Problem List for Assessment and Plan of chronic medical problems.

## 2016-01-28 NOTE — Progress Notes (Signed)
Patient received education resource, including the self-management goal and tool. Patient verbalized understanding. 

## 2016-01-29 ENCOUNTER — Encounter: Payer: Self-pay | Admitting: Internal Medicine

## 2016-01-29 NOTE — Assessment & Plan Note (Signed)
BP elevated here today, but typically controlled   BP Readings from Last 3 Encounters:  01/28/16 (!) 162/80  08/18/15 135/72  04/13/15 124/82   No change in medication today Work on weight loss Low sodium diet Start regular exercise Follow up in 6 months

## 2016-01-29 NOTE — Assessment & Plan Note (Signed)
Controlled, stable Continue current dose of medication  

## 2016-01-29 NOTE — Assessment & Plan Note (Signed)
Continue statin. Check lipids

## 2016-01-29 NOTE — Assessment & Plan Note (Signed)
Controlled Check a1c, urine microalbumin Start exercise, work on weight loss Continue current dose of metformin

## 2016-01-29 NOTE — Assessment & Plan Note (Signed)
Wt Readings from Last 3 Encounters:  01/28/16 249 lb (112.9 kg)  04/23/15 248 lb (112.5 kg)  04/13/15 242 lb (109.8 kg)   Stress weight loss Start swimming again Decrease portions

## 2016-02-04 ENCOUNTER — Other Ambulatory Visit (INDEPENDENT_AMBULATORY_CARE_PROVIDER_SITE_OTHER): Payer: 59

## 2016-02-04 ENCOUNTER — Encounter: Payer: Self-pay | Admitting: Internal Medicine

## 2016-02-04 DIAGNOSIS — E119 Type 2 diabetes mellitus without complications: Secondary | ICD-10-CM | POA: Diagnosis not present

## 2016-02-04 DIAGNOSIS — Z0000 Encounter for general adult medical examination without abnormal findings: Secondary | ICD-10-CM

## 2016-02-04 LAB — CBC WITH DIFFERENTIAL/PLATELET
BASOS PCT: 0.4 % (ref 0.0–3.0)
Basophils Absolute: 0 10*3/uL (ref 0.0–0.1)
EOS PCT: 3.3 % (ref 0.0–5.0)
Eosinophils Absolute: 0.3 10*3/uL (ref 0.0–0.7)
HEMATOCRIT: 45.4 % (ref 36.0–46.0)
HEMOGLOBIN: 15.2 g/dL — AB (ref 12.0–15.0)
Lymphocytes Relative: 27.6 % (ref 12.0–46.0)
Lymphs Abs: 2.7 10*3/uL (ref 0.7–4.0)
MCHC: 33.4 g/dL (ref 30.0–36.0)
MCV: 84.5 fl (ref 78.0–100.0)
MONO ABS: 0.5 10*3/uL (ref 0.1–1.0)
Monocytes Relative: 4.8 % (ref 3.0–12.0)
NEUTROS ABS: 6.3 10*3/uL (ref 1.4–7.7)
Neutrophils Relative %: 63.9 % (ref 43.0–77.0)
Platelets: 273 10*3/uL (ref 150.0–400.0)
RBC: 5.37 Mil/uL — ABNORMAL HIGH (ref 3.87–5.11)
RDW: 15.2 % (ref 11.5–15.5)
WBC: 9.9 10*3/uL (ref 4.0–10.5)

## 2016-02-04 LAB — COMPREHENSIVE METABOLIC PANEL
ALBUMIN: 4.6 g/dL (ref 3.5–5.2)
ALK PHOS: 62 U/L (ref 39–117)
ALT: 12 U/L (ref 0–35)
AST: 13 U/L (ref 0–37)
BUN: 17 mg/dL (ref 6–23)
CALCIUM: 9.1 mg/dL (ref 8.4–10.5)
CHLORIDE: 104 meq/L (ref 96–112)
CO2: 27 mEq/L (ref 19–32)
Creatinine, Ser: 0.62 mg/dL (ref 0.40–1.20)
GFR: 105.08 mL/min (ref >60.00)
Glucose, Bld: 117 mg/dL — ABNORMAL HIGH (ref 70–99)
POTASSIUM: 4 meq/L (ref 3.5–5.1)
SODIUM: 141 meq/L (ref 135–145)
Total Bilirubin: 0.7 mg/dL (ref 0.2–1.2)
Total Protein: 7.2 g/dL (ref 6.0–8.3)

## 2016-02-04 LAB — LIPID PANEL
CHOL/HDL RATIO: 5
Cholesterol: 199 mg/dL (ref 0–200)
HDL: 40.8 mg/dL (ref >39.00)
LDL Cholesterol: 134 mg/dL — ABNORMAL HIGH (ref 0–99)
NONHDL: 158.12
Triglycerides: 119 mg/dL (ref 0.0–149.0)
VLDL: 23.8 mg/dL (ref 0.0–40.0)

## 2016-02-04 LAB — HEMOGLOBIN A1C: HEMOGLOBIN A1C: 7 % — AB (ref 4.6–6.5)

## 2016-02-04 LAB — MICROALBUMIN / CREATININE URINE RATIO
Creatinine,U: 98.4 mg/dL
MICROALB UR: 5.3 mg/dL — AB (ref 0.0–1.9)
MICROALB/CREAT RATIO: 5.4 mg/g (ref 0.0–30.0)

## 2016-02-04 LAB — TSH: TSH: 2.48 u[IU]/mL (ref 0.35–4.50)

## 2016-02-07 LAB — HM MAMMOGRAPHY

## 2016-02-13 ENCOUNTER — Encounter: Payer: Self-pay | Admitting: Internal Medicine

## 2016-02-15 ENCOUNTER — Other Ambulatory Visit: Payer: Self-pay | Admitting: Internal Medicine

## 2016-03-28 ENCOUNTER — Ambulatory Visit (INDEPENDENT_AMBULATORY_CARE_PROVIDER_SITE_OTHER): Payer: 59 | Admitting: *Deleted

## 2016-03-28 VITALS — BP 108/80

## 2016-03-28 DIAGNOSIS — Z23 Encounter for immunization: Secondary | ICD-10-CM | POA: Diagnosis not present

## 2016-07-27 NOTE — Patient Instructions (Addendum)
  Test(s) ordered today. Your results will be released to [facility] (or called to you) after review, usually within 72hours after test completion. If any changes need to be made, you will be notified at that same time.  All other Health Maintenance issues reviewed.   All recommended immunizations and age-appropriate screenings are up-to-date or discussed.  No immunizations administered today.   Medications reviewed and updated.  No changes recommended at this time.  Your prescription(s) have been submitted to your pharmacy. Please take as directed and contact our office if you believe you are having problem(s) with the medication(s).   Please followup in 6 months for a physical

## 2016-07-27 NOTE — Progress Notes (Signed)
Subjective:    Patient ID: Katherine Lawrence, female    DOB: 27-Jul-1955, 61 y.o.   MRN: [id number]  HPI The patient is here for follow up.  Hypertension: She is taking her medication daily. She is compliant with a low sodium diet.  She denies chest pain, palpitations, edema, shortness of breath and regular headaches. She is not exercising regularly - she tries to exercise a little, but is limited due to chronic left knee pain and now left hip pain.  She does not monitor her blood pressure at home.     Diabetes: She is taking her medication daily as prescribed. She is compliant with a diabetic diet. She has lost weight.  She is not exercising regularly. She monitors her sugars and they have been running XXX.    Hyperlipidemia: She is taking her medication daily. She is compliant with a low fat/cholesterol diet. She is not exercising regularly. She denies myalgias.   Depression: She is taking her medication daily as prescribed. She denies any side effects from the medication. She feels her depression is well controlled and she is happy with her current dose of medication.     Medications and allergies reviewed with patient and updated if appropriate.  Patient Active Problem List   Diagnosis Date Noted  . Memory loss 04/13/2015  . Type 2 diabetes mellitus without complication, without long-term current use of insulin ([place]) 04/13/2015  . Obese 03/21/2015  . 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 Osteoarthritis of left knee   . Concentration deficit   . Postsurgical menopause 07/08/2012  . Ovarian cyst 07/21/2011  . Mild depression ([place]) 10/19/2008  . HYPERCHOLESTEROLEMIA 04/03/2008  . Essential hypertension 04/03/2008  . Myalgia and myositis 04/03/2008    Current Outpatient Prescriptions on File Prior to Visit  Medication Sig Dispense Refill  . amLODipine (NORVASC) 5 MG tablet Take 1 tablet (5 mg total) by mouth daily. 30 tablet 3  . aspirin EC 81 MG tablet Take 1  tablet (81 mg total) by mouth daily. 150 tablet 2  . Aspirin-Acetaminophen-Caffeine (GOODYS EXTRA STRENGTH PO) Take 1 packet by mouth 2 (two) times daily as needed (for pain).    Marland Kitchen atorvastatin (LIPITOR) 20 MG tablet TAKE 1 TABLET BY MOUTH  DAILY AT 6 PM. 90 tablet 3  . citalopram (CELEXA) 40 MG tablet Take 1 tablet by mouth  daily as needed (Must keep  appointment for further  refills) 45 tablet 0  . ibuprofen (ADVIL,MOTRIN) 200 MG tablet Take 1,200 mg by mouth every 8 (eight) hours as needed for fever, headache, mild pain, moderate pain or cramping.    Marland Kitchen losartan (COZAAR) 100 MG tablet Take 1 tablet (100 mg total) by mouth daily. 30 tablet 3  . metFORMIN (GLUCOPHAGE) 500 MG tablet Take 1 tablet (500 mg total) by mouth 2 (two) times daily with a meal. 180 tablet 1  . Propylene Glycol (SYSTANE BALANCE) 0.6 % SOLN Place 1 drop into both eyes daily as needed (for dry eyes).     No current facility-administered medications on file prior to visit.     Past Medical History:  Diagnosis Date  . CIN I (cervical intraepithelial neoplasia I) 1996   cryo...  cone of cervix  . DEPRESSION   . Diabetes type 2, controlled ([place])   . Endometrial polyp 07/21/2011  . Heart murmur   . HYPERCHOLESTEROLEMIA   . HYPERTENSION   . Osteoarthritis    hips and knees  . Pneumonia 06/2003  .  Postsurgical menopause     Past Surgical History:  Procedure Laterality Date  . ABDOMINAL HYSTERECTOMY  08/22/2011  . CERVICAL CONE BIOPSY  1996  . CESAREAN SECTION     x1  . COLONOSCOPY W/ POLYPECTOMY  12/2008   x3  . DILATION AND CURETTAGE OF UTERUS    . TONSILLECTOMY    . TOTAL HIP ARTHROPLASTY Right 2008  . TOTAL KNEE ARTHROPLASTY Left 02/14/2014   DR Durward Fortes  . TOTAL KNEE ARTHROPLASTY Left 02/14/2014   Procedure: TOTAL KNEE ARTHROPLASTY;  Surgeon: Garald Balding, MD;  Location: [facility];  Service: Orthopedics;  Laterality: Left;    Social History   Social History  . Marital status: Married    Spouse name: N/A   . Number of children: 2  . Years of education: N/A   Occupational History  . [employer]    works from home   Social History Main Topics  . Smoking status: Former Smoker    Packs/day: 0.25    Years: 1.00    Types: Cigarettes    Quit date: 07/08/2010  . Smokeless tobacco: Never Used  . Alcohol use 0.0 oz/week     Comment: Rare  . Drug use: No  . Sexual activity: Yes   Other Topics Concern  . None   Social History Narrative   Lives with spouse; grown kids   Pt is also a Ship broker.   Regular exercise-yes    Family History  Problem Relation Age of Onset  . Pancreatic cancer Mother 19  . Hypertension Father   . Heart disease Father     Review of Systems  Constitutional: Negative for chills and fever.  Respiratory: Positive for cough (residual from recent URI) and wheezing (from allergies). Negative for shortness of breath.   Cardiovascular: Negative for chest pain, palpitations and leg swelling.  Gastrointestinal: Negative for abdominal pain and nausea.       Occ gerd  Neurological: Positive for light-headedness (when getting up). Negative for headaches.       Objective:   Vitals:   07/28/16 0923  BP: (!) 144/82  Pulse: 82  Resp: 16  Temp: 98.1 F (36.7 C)   Wt Readings from Last 3 Encounters:  07/28/16 238 lb (108 kg)  01/28/16 249 lb (112.9 kg)  04/23/15 248 lb (112.5 kg)   Body mass index is 42.16 kg/m.   Physical Exam    Constitutional: Appears well-developed and well-nourished. No distress.  HENT:  Head: Normocephalic and atraumatic.  Neck: Neck supple. No tracheal deviation present. No thyromegaly present.  No cervical lymphadenopathy Cardiovascular: Normal rate, regular rhythm and normal heart sounds.   No murmur heard. No carotid bruit .  No edema Pulmonary/Chest: Effort normal and breath sounds normal. No respiratory distress. No has no wheezes. No rales.  Skin: Skin is warm and dry. Not diaphoretic.  Psychiatric: Normal  mood and affect. Behavior is normal.      Assessment & Plan:    See Problem List for Assessment and Plan of chronic medical problems.

## 2016-07-28 ENCOUNTER — Encounter: Payer: Self-pay | Admitting: Internal Medicine

## 2016-07-28 ENCOUNTER — Other Ambulatory Visit (INDEPENDENT_AMBULATORY_CARE_PROVIDER_SITE_OTHER): Payer: 59

## 2016-07-28 ENCOUNTER — Ambulatory Visit (INDEPENDENT_AMBULATORY_CARE_PROVIDER_SITE_OTHER): Payer: 59 | Admitting: Internal Medicine

## 2016-07-28 ENCOUNTER — Other Ambulatory Visit: Payer: 59

## 2016-07-28 VITALS — BP 144/82 | HR 82 | Temp 98.1°F | Resp 16 | Ht 63.0 in | Wt 238.0 lb

## 2016-07-28 DIAGNOSIS — E119 Type 2 diabetes mellitus without complications: Secondary | ICD-10-CM

## 2016-07-28 DIAGNOSIS — F320 Major depressive disorder, single episode, mild: Secondary | ICD-10-CM | POA: Diagnosis not present

## 2016-07-28 DIAGNOSIS — I10 Essential (primary) hypertension: Secondary | ICD-10-CM

## 2016-07-28 DIAGNOSIS — E7800 Pure hypercholesterolemia, unspecified: Secondary | ICD-10-CM | POA: Diagnosis not present

## 2016-07-28 DIAGNOSIS — F32A Depression, unspecified: Secondary | ICD-10-CM

## 2016-07-28 LAB — COMPREHENSIVE METABOLIC PANEL
ALBUMIN: 4.3 g/dL (ref 3.5–5.2)
ALK PHOS: 64 U/L (ref 39–117)
ALT: 9 U/L (ref 0–35)
AST: 9 U/L (ref 0–37)
BUN: 14 mg/dL (ref 6–23)
CO2: 32 mEq/L (ref 19–32)
CREATININE: 0.56 mg/dL (ref 0.40–1.20)
Calcium: 9.1 mg/dL (ref 8.4–10.5)
Chloride: 103 mEq/L (ref 96–112)
GFR: 117.98 mL/min (ref >60.00)
GLUCOSE: 114 mg/dL — AB (ref 70–99)
POTASSIUM: 3.9 meq/L (ref 3.5–5.1)
SODIUM: 141 meq/L (ref 135–145)
TOTAL PROTEIN: 6.9 g/dL (ref 6.0–8.3)
Total Bilirubin: 0.8 mg/dL (ref 0.2–1.2)

## 2016-07-28 LAB — LIPID PANEL
Cholesterol: 174 mg/dL (ref 0–200)
HDL: 33.9 mg/dL — ABNORMAL LOW
LDL Cholesterol: 119 mg/dL — ABNORMAL HIGH (ref 0–99)
NonHDL: 140.09
Total CHOL/HDL Ratio: 5
Triglycerides: 103 mg/dL (ref 0.0–149.0)
VLDL: 20.6 mg/dL (ref 0.0–40.0)

## 2016-07-28 LAB — HEMOGLOBIN A1C: HEMOGLOBIN A1C: 6.8 % — AB (ref 4.6–6.5)

## 2016-07-28 MED ORDER — LOSARTAN POTASSIUM 100 MG PO TABS: 100.0000 mg | ORAL_TABLET | Freq: Every day | ORAL | 3 refills | Status: DC

## 2016-07-28 MED ORDER — AMLODIPINE BESYLATE 5 MG PO TABS: 5.0000 mg | ORAL_TABLET | Freq: Every day | ORAL | 3 refills | Status: DC

## 2016-07-28 NOTE — Assessment & Plan Note (Signed)
Check lipid panel  Continue daily statin Regular exercise and healthy diet encouraged  

## 2016-07-28 NOTE — Assessment & Plan Note (Signed)
Check a1c Continue weight loss efforts Increase exercise Follow up in 6 months

## 2016-07-28 NOTE — Assessment & Plan Note (Signed)
BP slightly elevated today Will continue to monitor Continue current medications at current doses Working on weight loss - she will continue her efforts Will increase exercise

## 2016-07-28 NOTE — Assessment & Plan Note (Signed)
Controlled, stable Continue current dose of medication  

## 2016-09-23 ENCOUNTER — Telehealth: Payer: Self-pay | Admitting: Internal Medicine

## 2016-09-23 MED ORDER — CITALOPRAM HYDROBROMIDE 40 MG PO TABS
ORAL_TABLET | ORAL | 1 refills | Status: DC
Start: 2016-09-23 — End: 2017-03-22

## 2016-09-23 NOTE — Telephone Encounter (Signed)
citalopram (CELEXA) 40 MG tablet  Would like all future refills sent to [facility] on [address]. Is requesting refill to be sent it.

## 2016-10-29 ENCOUNTER — Encounter: Payer: Self-pay | Admitting: Gynecology

## 2016-11-03 ENCOUNTER — Telehealth (INDEPENDENT_AMBULATORY_CARE_PROVIDER_SITE_OTHER): Payer: Self-pay | Admitting: Orthopaedic Surgery

## 2016-11-03 NOTE — Telephone Encounter (Signed)
[facility] RECORDS FAXED TO [facility] [phone]

## 2016-12-30 LAB — HM DIABETES EYE EXAM

## 2017-01-20 ENCOUNTER — Other Ambulatory Visit: Payer: Self-pay | Admitting: Internal Medicine

## 2017-01-26 ENCOUNTER — Ambulatory Visit: Payer: Self-pay | Admitting: Internal Medicine

## 2017-02-24 LAB — HM MAMMOGRAPHY

## 2017-03-04 ENCOUNTER — Telehealth: Payer: Self-pay | Admitting: Emergency Medicine

## 2017-03-04 NOTE — Telephone Encounter (Signed)
LVM for pt to call back and schedule appt for surgical clearance.

## 2017-03-22 ENCOUNTER — Other Ambulatory Visit: Payer: Self-pay | Admitting: Internal Medicine

## 2017-04-11 ENCOUNTER — Other Ambulatory Visit: Payer: Self-pay | Admitting: Internal Medicine

## 2017-04-16 ENCOUNTER — Ambulatory Visit: Payer: Self-pay | Admitting: Orthopedic Surgery

## 2017-05-02 ENCOUNTER — Ambulatory Visit: Payer: Self-pay | Admitting: Orthopedic Surgery

## 2017-05-02 NOTE — H&P (Signed)
Katherine Lawrence DOB: 07-31-1955 Unknown / Language: [name] / Race: White Female Date of admission: May 20, 2017  Chief complaint: Left hip pain History of Present Illness The patient is a 61 year old female who comes in for a preoperative History and Physical. The patient is scheduled for a left total hip arthroplasty (anterior) to be performed by Dr. Dione Plover. Aluisio, MD at [facility] on 05-20-2017. The patient is a 61 year old female who present for follow up of their hip. The patient is being followed for their left hip pain. Symptoms reported include: pain. The patient feels that they are doing 25 percent better and report their pain level to be moderate. Current treatment includes: NSAIDs. She had a significant stiffness post total knee arthroplasty on the LEFT with significant pain associated with that. In addition she has a significant arthritic LEFT hip. She feels that the hip is limiting her as much or more than the knee is now. The intra-articular injection provided some benefit but is wearing off.  -rays again today and she has bone-on-bone arthritis in that LEFT hip. She has a very complex situation with severe arthritis in the LEFT hip as well as the very stiff painful total knee arthroplasty. Of the 2 the hip is currently bothering her more as evidenced by the good response she had to the hip pain as well as some of the knee pain from the injection. I feel we get the hip fixed in her mobility will improve. If we did the knee now her likelihood of being able to effectively rehab will be diminished due to the arthritic hip. She wants to go ahead and get the hip fixed. At this point, the most predictable means of improving pain and function is total hip arthroplasty. The procedure, risks, potential complications and rehab course are discussed in detail and the patient elects to proceed.   Problem List/Past Medical History of total knee arthroplasty, left (I77.824)   Primary osteoarthritis of left hip (M16.12)  Glaucoma  Chronic Pain  Diabetes Mellitus, Type II  High blood pressure  Hypercholesterolemia  Osteoarthritis   Allergies No Known Drug Allergies   Family History  Cancer  Mother. Cerebrovascular Accident  Father. Congestive Heart Failure  Father, Paternal Grandmother. First Degree Relatives  reported Heart Disease  Father, Maternal Grandfather. Heart disease in female family member before age 33  Hypertension  Father. Osteoarthritis  Father. Rheumatoid Arthritis  Paternal Grandmother.  Social History Children  2 Current work status  working full time Exercise  Exercises daily; does individual sport Former drinker  01/02/2017: In the past drank beer and wine only occasionally per week Living situation  live with spouse Marital status  married Not under pain contract  Number of flights of stairs before winded  1 Tobacco / smoke exposure  01/02/2017: no Tobacco use  Former smoker. 01/02/2017: smoke(d) 1 pack(s) per day uses less than 1/2 can(s) smokeless per week Post-Surgical Plans  Home With Caregiver. Home Exercise Program - Exercise Sheet Provided  Medication History  Losartan Potassium (100MG  Tablet, Oral) Active. Citalopram Hydrobromide (40MG  Tablet, Oral) Active. Atorvastatin Calcium (20MG  Tablet, Oral) Active. AmLODIPine Besylate (5MG  Tablet, Oral) Active. Latanoprost (0.005% Solution, Ophthalmic) Active. MetFORMIN HCl (Oral) Specific strength unknown - Active. Aspirin 81 mg Active. Pseudofed Active.  Past Surgical History  Cesarean Delivery  1 time Dilation and Curettage of Uterus  Hysterectomy  complete (non-cancerous) Total Hip Replacement  right Total Knee Replacement  left   Review of  Systems General Not Present- Chills, Fatigue, Fever, Memory Loss, Night Sweats, Weight Gain and Weight Loss. Skin Not Present- Eczema, Hives, Itching, Lesions and Rash. HEENT Not  Present- Dentures, Double Vision, Headache, Hearing Loss, Tinnitus and Visual Loss. Respiratory Not Present- Allergies, Chronic Cough, Coughing up blood, Shortness of breath at rest and Shortness of breath with exertion. Cardiovascular Not Present- Chest Pain, Difficulty Breathing Lying Down, Murmur, Palpitations, Racing/skipping heartbeats and Swelling. Gastrointestinal Not Present- Abdominal Pain, Bloody Stool, Constipation, Diarrhea, Difficulty Swallowing, Heartburn, Jaundice, Loss of appetitie, Nausea and Vomiting. Female Genitourinary Not Present- Blood in Urine, Discharge, Flank Pain, Incontinence, Painful Urination, Urgency, Urinary frequency, Urinary Retention, Urinating at Night and Weak urinary stream. Musculoskeletal Not Present- Back Pain, Joint Pain, Joint Swelling, Morning Stiffness, Muscle Pain, Muscle Weakness and Spasms. Neurological Not Present- Blackout spells, Difficulty with balance, Dizziness, Paralysis, Tremor and Weakness. Psychiatric Not Present- Insomnia.  Vitals  Weight: 234 lb Height: 64in Body Surface Area: 2.09 m Body Mass Index: 40.17 kg/m  Pulse: 64 (Regular)  BP: 138/90 (Sitting, Left Arm, Standard)    Physical Exam  General Mental Status -Alert, cooperative and good historian. General Appearance-pleasant, Not in acute distress. Orientation-Oriented X3. Build & Nutrition-Well nourished and Well developed.  Head and Neck Head-normocephalic, atraumatic . Neck Global Assessment - supple, no bruit auscultated on the right, no bruit auscultated on the left.  Eye Vision-Wears corrective lenses. Pupil - Bilateral-Regular and Round. Motion - Bilateral-EOMI.  Chest and Lung Exam Auscultation Breath sounds - clear at anterior chest wall and clear at posterior chest wall. Adventitious sounds - No Adventitious sounds.  Cardiovascular Auscultation Rhythm - Regular rate and rhythm. Heart Sounds - S1 WNL and S2 WNL. Murmurs & Other  Heart Sounds - Auscultation of the heart reveals - No Murmurs.  Abdomen Inspection Contour - Generalized moderate distention. Palpation/Percussion Tenderness - Abdomen is non-tender to palpation. Rigidity (guarding) - Abdomen is soft. Auscultation Auscultation of the abdomen reveals - Bowel sounds normal.  Female Genitourinary Note: Not done, not pertinent to present illness   Musculoskeletal Note: Her LEFT hip can be flexed to 100 with no internal rotation about 10 of external rotation and 10-20 of abduction. Her RIGHT hip has normal range of motion. Her LEFT knee shows range of motion 15-75. There is no instability about the knee.  hip x-rays shows that she has bone-on-bone arthritis in that LEFT hip.   Assessment & Plan  Primary osteoarthritis of left hip (M16.12)  Note:Surgical Plans: Left Total Hip Replacement - Anterior Approach  Disposition: Home with assistance, Home Exercise Program - Exercise Sheet Provided at time of H&P  PCP: Dr. Billey Gosling - pending  IV TXA  Anesthesia Issues: None  Patient was instructed on what medications to stop prior to surgery.  Arlee Muslim, PA-C

## 2017-05-02 NOTE — H&P (View-Only) (Signed)
Katherine Lawrence DOB: Mar 15, 1956 Unknown / Language: Katherine Lawrence / Race: White Female Date of admission: May 20, 2017  Chief complaint: Left hip pain History of Present Illness The patient is a 61 year old female who comes in for a preoperative History and Physical. The patient is scheduled for a left total hip arthroplasty (anterior) to be performed by Dr. Dione Plover. Aluisio, MD at [facility] on 05-20-2017. The patient is a 61 year old female who present for follow up of their hip. The patient is being followed for their left hip pain. Symptoms reported include: pain. The patient feels that they are doing 25 percent better and report their pain level to be moderate. Current treatment includes: NSAIDs. She had a significant stiffness post total knee arthroplasty on the LEFT with significant pain associated with that. In addition she has a significant arthritic LEFT hip. She feels that the hip is limiting her as much or more than the knee is now. The intra-articular injection provided some benefit but is wearing off.  -rays again today and she has bone-on-bone arthritis in that LEFT hip. She has a very complex situation with severe arthritis in the LEFT hip as well as the very stiff painful total knee arthroplasty. Of the 2 the hip is currently bothering her more as evidenced by the good response she had to the hip pain as well as some of the knee pain from the injection. I feel we get the hip fixed in her mobility will improve. If we did the knee now her likelihood of being able to effectively rehab will be diminished due to the arthritic hip. She wants to go ahead and get the hip fixed. At this point, the most predictable means of improving pain and function is total hip arthroplasty. The procedure, risks, potential complications and rehab course are discussed in detail and the patient elects to proceed.   Problem List/Past Medical History of total knee arthroplasty, left (F68.127)   Primary osteoarthritis of left hip (M16.12)  Glaucoma  Chronic Pain  Diabetes Mellitus, Type II  High blood pressure  Hypercholesterolemia  Osteoarthritis   Allergies No Known Drug Allergies   Family History  Cancer  Mother. Cerebrovascular Accident  Father. Congestive Heart Failure  Father, Paternal Grandmother. First Degree Relatives  reported Heart Disease  Father, Maternal Grandfather. Heart disease in female family member before age 65  Hypertension  Father. Osteoarthritis  Father. Rheumatoid Arthritis  Paternal Grandmother.  Social History Children  2 Current work status  working full time Exercise  Exercises daily; does individual sport Former drinker  01/02/2017: In the past drank beer and wine only occasionally per week Living situation  live with spouse Marital status  married Not under pain contract  Number of flights of stairs before winded  1 Tobacco / smoke exposure  01/02/2017: no Tobacco use  Former smoker. 01/02/2017: smoke(d) 1 pack(s) per day uses less than 1/2 can(s) smokeless per week Post-Surgical Plans  Home With Caregiver. Home Exercise Program - Exercise Sheet Provided  Medication History  Losartan Potassium (100MG  Tablet, Oral) Active. Citalopram Hydrobromide (40MG  Tablet, Oral) Active. Atorvastatin Calcium (20MG  Tablet, Oral) Active. AmLODIPine Besylate (5MG  Tablet, Oral) Active. Latanoprost (0.005% Solution, Ophthalmic) Active. MetFORMIN HCl (Oral) Specific strength unknown - Active. Aspirin 81 mg Active. Pseudofed Active.  Past Surgical History  Cesarean Delivery  1 time Dilation and Curettage of Uterus  Hysterectomy  complete (non-cancerous) Total Hip Replacement  right Total Knee Replacement  left   Review of  Systems General Not Present- Chills, Fatigue, Fever, Memory Loss, Night Sweats, Weight Gain and Weight Loss. Skin Not Present- Eczema, Hives, Itching, Lesions and Rash. HEENT Not  Present- Dentures, Double Vision, Headache, Hearing Loss, Tinnitus and Visual Loss. Respiratory Not Present- Allergies, Chronic Cough, Coughing up blood, Shortness of breath at rest and Shortness of breath with exertion. Cardiovascular Not Present- Chest Pain, Difficulty Breathing Lying Down, Murmur, Palpitations, Racing/skipping heartbeats and Swelling. Gastrointestinal Not Present- Abdominal Pain, Bloody Stool, Constipation, Diarrhea, Difficulty Swallowing, Heartburn, Jaundice, Loss of appetitie, Nausea and Vomiting. Female Genitourinary Not Present- Blood in Urine, Discharge, Flank Pain, Incontinence, Painful Urination, Urgency, Urinary frequency, Urinary Retention, Urinating at Night and Weak urinary stream. Musculoskeletal Not Present- Back Pain, Joint Pain, Joint Swelling, Morning Stiffness, Muscle Pain, Muscle Weakness and Spasms. Neurological Not Present- Blackout spells, Difficulty with balance, Dizziness, Paralysis, Tremor and Weakness. Psychiatric Not Present- Insomnia.  Vitals  Weight: 234 lb Height: 64in Body Surface Area: 2.09 m Body Mass Index: 40.17 kg/m  Pulse: 64 (Regular)  BP: 138/90 (Sitting, Left Arm, Standard)    Physical Exam  General Mental Status -Alert, cooperative and good historian. General Appearance-pleasant, Not in acute distress. Orientation-Oriented X3. Build & Nutrition-Well nourished and Well developed.  Head and Neck Head-normocephalic, atraumatic . Neck Global Assessment - supple, no bruit auscultated on the right, no bruit auscultated on the left.  Eye Vision-Wears corrective lenses. Pupil - Bilateral-Regular and Round. Motion - Bilateral-EOMI.  Chest and Lung Exam Auscultation Breath sounds - clear at anterior chest wall and clear at posterior chest wall. Adventitious sounds - No Adventitious sounds.  Cardiovascular Auscultation Rhythm - Regular rate and rhythm. Heart Sounds - S1 WNL and S2 WNL. Murmurs & Other  Heart Sounds - Auscultation of the heart reveals - No Murmurs.  Abdomen Inspection Contour - Generalized moderate distention. Palpation/Percussion Tenderness - Abdomen is non-tender to palpation. Rigidity (guarding) - Abdomen is soft. Auscultation Auscultation of the abdomen reveals - Bowel sounds normal.  Female Genitourinary Note: Not done, not pertinent to present illness   Musculoskeletal Note: Her LEFT hip can be flexed to 100 with no internal rotation about 10 of external rotation and 10-20 of abduction. Her RIGHT hip has normal range of motion. Her LEFT knee shows range of motion 15-75. There is no instability about the knee.  hip x-rays shows that she has bone-on-bone arthritis in that LEFT hip.   Assessment & Plan  Primary osteoarthritis of left hip (M16.12)  Note:Surgical Plans: Left Total Hip Replacement - Anterior Approach  Disposition: Home with assistance, Home Exercise Program - Exercise Sheet Provided at time of H&P  PCP: Dr. Billey Gosling - pending  IV TXA  Anesthesia Issues: None  Patient was instructed on what medications to stop prior to surgery.  Arlee Muslim, PA-C

## 2017-05-10 DIAGNOSIS — M1612 Unilateral primary osteoarthritis, left hip: Secondary | ICD-10-CM | POA: Insufficient documentation

## 2017-05-10 HISTORY — DX: Unilateral primary osteoarthritis, left hip: M16.12

## 2017-05-10 NOTE — Patient Instructions (Addendum)
  Test(s) ordered today. Your results will be released to MyChart (or called to you) after review, usually within 72hours after test completion. If any changes need to be made, you will be notified at that same time.  Flu immunization administered today.    Medications reviewed and updated.  No changes recommended at this time.    Please followup in 6months   

## 2017-05-10 NOTE — Progress Notes (Signed)
Subjective:    Patient ID: Katherine Lawrence, female    DOB: 04-06-1956, 61 y.o.   MRN: [id number]  HPI  The patient is here for surgical clearance at the request of Dr. Wynelle Link for a left hip replacement.  Surgery is scheduled for 05/20/17.  She is failed conservative measures of treating her pain and therefore surgery is necessary.   She has had surgeries in the past.  She did not have any allergic reactions to anesthesia or anesthesia complications.  When she had her knee surgery she did have some amnesia related to the anesthesia and does not recall being in the hospital at all.  She denies a family history of allergies anesthesia.  She denies any bleeding or blood clot problems personally or family history of.  She is currently not exercising regularly secondary to her left hip pain.  She denies chest pain, palpitations, shortness of breath lightheadedness with daily activities.   Diabetes: She is taking her medication daily as prescribed. She is compliant with a diabetic diet. She is not exercising regularly. She checks her feet daily and denies foot lesions. She is up-to-date with an ophthalmology examination.   Hypertension: She is taking her medication daily. She is compliant with a low sodium diet.  She denies chest pain, palpitations, edema, shortness of breath and regular headaches. She is not exercising regularly.     Medications and allergies reviewed with patient and updated if appropriate.  Patient Active Problem List   Diagnosis Date Noted  . Osteoarthritis of left hip 05/10/2017  . Memory loss 04/13/2015  . Type 2 diabetes mellitus without complication, without long-term current use of insulin ([place]) 04/13/2015  . Obese 03/21/2015  . 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 Osteoarthritis of left knee   . Concentration deficit   . Postsurgical menopause 07/08/2012  . 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 Ovarian cyst 07/21/2011  . Mild depression ([place])  10/19/2008  . HYPERCHOLESTEROLEMIA 04/03/2008  . Essential hypertension 04/03/2008  . Myalgia and myositis 04/03/2008    Current Outpatient Medications on File Prior to Visit  Medication Sig Dispense Refill  . amLODipine (NORVASC) 5 MG tablet Take 1 tablet (5 mg total) by mouth daily. 90 tablet 3  . aspirin EC 81 MG tablet Take 1 tablet (81 mg total) by mouth daily. 150 tablet 2  . Aspirin-Acetaminophen-Caffeine (GOODYS EXTRA STRENGTH PO) Take 1 packet by mouth 2 (two) times daily as needed (for pain).    Marland Kitchen atorvastatin (LIPITOR) 20 MG tablet TAKE 1 TABLET BY MOUTH  DAILY AT 6 PM. 90 tablet 0  . citalopram (CELEXA) 40 MG tablet TAKE 1 TABLET BY MOUTH DAILY AS NEEDED 45 tablet 0  . ibuprofen (ADVIL,MOTRIN) 200 MG tablet Take 600 mg by mouth every 8 (eight) hours as needed for fever, headache, mild pain, moderate pain or cramping.    Marland Kitchen losartan (COZAAR) 100 MG tablet Take 1 tablet (100 mg total) by mouth daily. 90 tablet 3  . metFORMIN (GLUCOPHAGE) 500 MG tablet Take 1 tablet (500 mg total) by mouth 2 (two) times daily with a meal. 180 tablet 1  . Propylene Glycol (SYSTANE BALANCE) 0.6 % SOLN Place 1 drop into both eyes daily as needed (for dry eyes).     No current facility-administered medications on file prior to visit.     Past Medical History:  Diagnosis Date  . CIN I (cervical intraepithelial neoplasia I) 1996   cryo...  cone of cervix  .  DEPRESSION   . Diabetes type 2, controlled ([place])   . Endometrial polyp 07/21/2011  . Heart murmur   . HYPERCHOLESTEROLEMIA   . HYPERTENSION   . Osteoarthritis    hips and knees  . Pneumonia 06/2003  . Postsurgical menopause     Past Surgical History:  Procedure Laterality Date  . ABDOMINAL HYSTERECTOMY  08/22/2011  . CERVICAL CONE BIOPSY  1996  . CESAREAN SECTION     x1  . COLONOSCOPY W/ POLYPECTOMY  12/2008   x3  . DILATION AND CURETTAGE OF UTERUS    . TONSILLECTOMY    . TOTAL HIP ARTHROPLASTY Right 2008  . TOTAL KNEE ARTHROPLASTY  Left 02/14/2014   DR Durward Fortes  . TOTAL KNEE ARTHROPLASTY Left 02/14/2014   Procedure: TOTAL KNEE ARTHROPLASTY;  Surgeon: Garald Balding, MD;  Location: [facility];  Service: Orthopedics;  Laterality: Left;    Social History   Socioeconomic History  . Marital status: Married    Spouse name: None  . Number of children: 2  . Years of education: None  . Highest education level: None  Social Needs  . Financial resource strain: None  . Food insecurity - worry: None  . Food insecurity - inability: None  . Transportation needs - medical: None  . Transportation needs - non-medical: None  Occupational History  . 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 Smoking status: Former Smoker    Packs/day: 0.25    Years: 1.00    Pack years: 0.25    Types: Cigarettes    Last attempt to quit: 07/08/2010    Years since quitting: 6.8  . Smokeless tobacco: Never Used  Substance and Sexual Activity  . Alcohol use: Yes    Alcohol/week: 0.0 oz    Comment: Rare  . Drug use: No  . Sexual activity: Yes  Other Topics Concern  . None  Social History Narrative   Lives with spouse; grown kids   Pt is also a Ship broker.   Regular exercise-yes    Family History  Problem Relation Age of Onset  . Pancreatic cancer Mother 33  . Hypertension Father   . Heart disease Father     Review of Systems  Constitutional: Negative for chills and fever.  Respiratory: Negative for cough, shortness of breath and wheezing.   Cardiovascular: Negative for chest pain, palpitations and leg swelling.  Gastrointestinal: Negative for abdominal pain, blood in stool, constipation, diarrhea and nausea.  Genitourinary: Positive for frequency. Negative for dysuria and hematuria.  Neurological: Positive for light-headedness (when going from laying down to getting up). Negative for headaches.       Objective:   Vitals:   05/11/17 1107  BP: 136/82  Pulse: 76  Resp:  16  Temp: 98.5 F (36.9 C)  SpO2: 95%   Wt Readings from Last 3 Encounters:  05/11/17 231 lb (104.8 kg)  07/28/16 238 lb (108 kg)  01/28/16 249 lb (112.9 kg)   Body mass index is 40.92 kg/m.   Physical Exam    Constitutional: Appears well-developed and well-nourished. No distress.  HENT:  Head: Normocephalic and atraumatic.  Neck: Neck supple. No tracheal deviation present. No thyromegaly present.  No cervical lymphadenopathy Cardiovascular: Normal rate, regular rhythm and normal heart sounds.   No murmur heard. No carotid bruit .  No edema Pulmonary/Chest: Effort normal and breath sounds normal. No respiratory distress. No has no wheezes. No rales.  Abdomen:  Soft, nontender, nondistended Skin: Skin is warm and dry. Not diaphoretic.  Psychiatric: Normal mood and affect. Behavior is normal.   Diabetic Foot Exam - Simple   Simple Foot Form Diabetic Foot exam was performed with the following findings:  Yes   Visual Inspection No deformities, no ulcerations, no other skin breakdown bilaterally:  Yes Sensation Testing Intact to touch and monofilament testing bilaterally:  Yes Pulse Check Posterior Tibialis and Dorsalis pulse intact bilaterally:  Yes Comments       Assessment & Plan:    See Problem List for Assessment and Plan of chronic medical problems.

## 2017-05-11 ENCOUNTER — Encounter: Payer: Self-pay | Admitting: Internal Medicine

## 2017-05-11 ENCOUNTER — Ambulatory Visit (INDEPENDENT_AMBULATORY_CARE_PROVIDER_SITE_OTHER): Payer: 59 | Admitting: Internal Medicine

## 2017-05-11 ENCOUNTER — Other Ambulatory Visit (INDEPENDENT_AMBULATORY_CARE_PROVIDER_SITE_OTHER): Payer: 59

## 2017-05-11 VITALS — BP 136/82 | HR 76 | Temp 98.5°F | Resp 16 | Ht 63.0 in | Wt 231.0 lb

## 2017-05-11 DIAGNOSIS — E119 Type 2 diabetes mellitus without complications: Secondary | ICD-10-CM

## 2017-05-11 DIAGNOSIS — M1612 Unilateral primary osteoarthritis, left hip: Secondary | ICD-10-CM

## 2017-05-11 DIAGNOSIS — Z23 Encounter for immunization: Secondary | ICD-10-CM

## 2017-05-11 DIAGNOSIS — I10 Essential (primary) hypertension: Secondary | ICD-10-CM

## 2017-05-11 DIAGNOSIS — Z01818 Encounter for other preprocedural examination: Secondary | ICD-10-CM | POA: Diagnosis not present

## 2017-05-11 LAB — CBC WITH DIFFERENTIAL/PLATELET
BASOS ABS: 0.1 10*3/uL (ref 0.0–0.1)
BASOS PCT: 1.1 % (ref 0.0–3.0)
Eosinophils Absolute: 0.3 10*3/uL (ref 0.0–0.7)
Eosinophils Relative: 3 % (ref 0.0–5.0)
HCT: 47.3 % — ABNORMAL HIGH (ref 36.0–46.0)
Hemoglobin: 15.4 g/dL — ABNORMAL HIGH (ref 12.0–15.0)
LYMPHS ABS: 2.5 10*3/uL (ref 0.7–4.0)
Lymphocytes Relative: 27.3 % (ref 12.0–46.0)
MCHC: 32.6 g/dL (ref 30.0–36.0)
MCV: 88.3 fl (ref 78.0–100.0)
MONOS PCT: 5.3 % (ref 3.0–12.0)
Monocytes Absolute: 0.5 10*3/uL (ref 0.1–1.0)
NEUTROS ABS: 5.7 10*3/uL (ref 1.4–7.7)
NEUTROS PCT: 63.3 % (ref 43.0–77.0)
PLATELETS: 285 10*3/uL (ref 150.0–400.0)
RBC: 5.36 Mil/uL — ABNORMAL HIGH (ref 3.87–5.11)
RDW: 14.4 % (ref 11.5–15.5)
WBC: 9 10*3/uL (ref 4.0–10.5)

## 2017-05-11 LAB — COMPREHENSIVE METABOLIC PANEL
ALT: 12 U/L (ref 0–35)
AST: 14 U/L (ref 0–37)
Albumin: 4.7 g/dL (ref 3.5–5.2)
Alkaline Phosphatase: 56 U/L (ref 39–117)
BILIRUBIN TOTAL: 0.8 mg/dL (ref 0.2–1.2)
BUN: 19 mg/dL (ref 6–23)
CHLORIDE: 101 meq/L (ref 96–112)
CO2: 30 meq/L (ref 19–32)
CREATININE: 0.7 mg/dL (ref 0.40–1.20)
Calcium: 9.9 mg/dL (ref 8.4–10.5)
GFR: 90.95 mL/min (ref >60.00)
GLUCOSE: 111 mg/dL — AB (ref 70–99)
Potassium: 3.7 mEq/L (ref 3.5–5.1)
Sodium: 141 mEq/L (ref 135–145)
Total Protein: 7.3 g/dL (ref 6.0–8.3)

## 2017-05-11 LAB — HEMOGLOBIN A1C: HEMOGLOBIN A1C: 6.4 % (ref 4.6–6.5)

## 2017-05-11 MED ORDER — METFORMIN HCL 500 MG PO TABS: 500.0000 mg | ORAL_TABLET | Freq: Two times a day (BID) | ORAL | 1 refills | Status: DC

## 2017-05-11 MED ORDER — BLOOD GLUCOSE MONITOR KIT: PACK | 0 refills | Status: DC

## 2017-05-11 NOTE — Assessment & Plan Note (Addendum)
She is low risk for low risk operation - cleared No concerns with bleeding, blood clotting problems or anesthesia No concerning symptoms of cardiovascular or pulmonary disease with daily activities We will get basic blood work today and check on sugar control - cbc, cmp, a1c

## 2017-05-11 NOTE — Assessment & Plan Note (Signed)
Check a1c Low sugar / carb diet Stressed regular exercise - once hip surgery is done - hopefully that will help her lose weight

## 2017-05-11 NOTE — Assessment & Plan Note (Signed)
BP Readings from Last 3 Encounters:  05/11/17 136/82  07/28/16 (!) 144/82  03/28/16 108/80   BP well controlled Current regimen effective and well tolerated Continue current medications at current doses cmp

## 2017-05-11 NOTE — Assessment & Plan Note (Signed)
Severe in nature, associated with severe pain Failed conservative treatment Left total hip replacement medically necessary Cleared for surgery today

## 2017-05-14 ENCOUNTER — Other Ambulatory Visit (HOSPITAL_COMMUNITY): Payer: Self-pay | Admitting: Emergency Medicine

## 2017-05-14 NOTE — Patient Instructions (Signed)
Katherine Lawrence  05/14/2017   Your procedure is scheduled on: 05-20-17  Report to [facility] Main  Entrance    Report to admitting at 730AM   Call this number if you have problems the morning of surgery  [phone]   Remember: ONLY 1 PERSON MAY GO WITH YOU TO SHORT STAY TO GET  READY MORNING OF YOUR SURGERY.  Do not eat food or drink liquids :After Midnight.     Take these medicines the morning of surgery with A SIP OF WATER: AMLODIPINE, CITALOPRAM, EYE DROPS , ATORVASTATIN                                 You may not have any metal on your body including hair pins and              piercings  Do not wear jewelry, make-up, lotions, powders or perfumes, deodorant             Do not wear nail polish.  Do not shave  48 hours prior to surgery.              Do not bring valuables to the [facility]. [facility].  Contacts, dentures or bridgework may not be worn into surgery.  Leave suitcase in the car. After surgery it may be brought to your room.                Please read over the following fact sheets you were given: _____________________________________________________________________            How to Manage Your Diabetes Before and After Surgery  Why is it important to control my blood sugar before and after surgery? . Improving blood sugar levels before and after surgery helps healing and can limit problems. . A way of improving blood sugar control is eating a healthy diet by: o  Eating less sugar and carbohydrates o  Increasing activity/exercise o  Talking with your doctor about reaching your blood sugar goals . High blood sugars (greater than 180 mg/dL) can raise your risk of infections and slow your recovery, so you will need to focus on controlling your diabetes during the weeks before surgery. . Make sure that the doctor who takes care of your diabetes knows about your planned surgery including the  date and location.  How do I manage my blood sugar before surgery? . Check your blood sugar at least 4 times a day, starting 2 days before surgery, to make sure that the level is not too high or low. o Check your blood sugar the morning of your surgery when you wake up and every 2 hours until you get to the Short Stay unit. . If your blood sugar is less than 70 mg/dL, you will need to treat for low blood sugar: o Do not take insulin. o Treat a low blood sugar (less than 70 mg/dL) with  cup of clear juice (cranberry or apple), 4 glucose tablets, OR glucose gel. o Recheck blood sugar in 15 minutes after treatment (to make sure it is greater than 70 mg/dL). If your blood sugar is not greater than 70 mg/dL on recheck, call [phone] for further instructions. . Report your blood sugar to the  short stay nurse when you get to Short Stay.  . If you are admitted to the [facility] after surgery: o Your blood sugar will be checked by the staff and you will probably be given insulin after surgery (instead of oral diabetes medicines) to make sure you have good blood sugar levels. o The goal for blood sugar control after surgery is 80-180 mg/dL.   WHAT DO I DO ABOUT MY DIABETES MEDICATION?   . THE DAY BEFORE SURGERY, take  METFORMIN as normal     .  Do not take oral diabetes medicines (pills) the morning of surgery.   Patient Signature:  Date:   Nurse Signature:  Date:   Reviewed and Endorsed by [facility] Patient Education Committee, August 2015    [facility] - Preparing for Surgery Before surgery, you can play an important role.  Because skin is not sterile, your skin needs to be as free of germs as possible.  You can reduce the number of germs on your skin by washing with CHG (chlorahexidine gluconate) soap before surgery.  CHG is an antiseptic cleaner which kills germs and bonds with the skin to continue killing germs even after washing. Please DO NOT use if you have an allergy to CHG or  antibacterial soaps.  If your skin becomes reddened/irritated stop using the CHG and inform your nurse when you arrive at Short Stay. Do not shave (including legs and underarms) for at least 48 hours prior to the first CHG shower.  You may shave your face/neck. Please follow these instructions carefully:  1.  Shower with CHG Soap the night before surgery and the  morning of Surgery.  2.  If you choose to wash your hair, wash your hair first as usual with your  normal  shampoo.  3.  After you shampoo, rinse your hair and body thoroughly to remove the  shampoo.                           4.  Use CHG as you would any other liquid soap.  You can apply chg directly  to the skin and wash                       Gently with a scrungie or clean washcloth.  5.  Apply the CHG Soap to your body ONLY FROM THE NECK DOWN.   Do not use on face/ open                           Wound or open sores. Avoid contact with eyes, ears mouth and genitals (private parts).                       Wash face,  Genitals (private parts) with your normal soap.             6.  Wash thoroughly, paying special attention to the area where your surgery  will be performed.  7.  Thoroughly rinse your body with warm water from the neck down.  8.  DO NOT shower/wash with your normal soap after using and rinsing off  the CHG Soap.                9.  Pat yourself dry with a clean towel.            10.  Wear clean pajamas.  11.  Place clean sheets on your bed the night of your first shower and do not  sleep with pets. Day of Surgery : Do not apply any lotions/deodorants the morning of surgery.  Please wear clean clothes to the [facility]/surgery center.  FAILURE TO FOLLOW THESE INSTRUCTIONS MAY RESULT IN THE CANCELLATION OF YOUR SURGERY PATIENT SIGNATURE_________________________________  NURSE SIGNATURE__________________________________  ________________________________________________________________________   Katherine Lawrence  An incentive spirometer is a tool that can help keep your lungs clear and active. This tool measures how well you are filling your lungs with each breath. Taking long deep breaths may help reverse or decrease the chance of developing breathing (pulmonary) problems (especially infection) following:  A long period of time when you are unable to move or be active. BEFORE THE PROCEDURE   If the spirometer includes an indicator to show your best effort, your nurse or respiratory therapist will set it to a desired goal.  If possible, sit up straight or lean slightly forward. Try not to slouch.  Hold the incentive spirometer in an upright position. INSTRUCTIONS FOR USE  1. Sit on the edge of your bed if possible, or sit up as far as you can in bed or on a chair. 2. Hold the incentive spirometer in an upright position. 3. Breathe out normally. 4. Place the mouthpiece in your mouth and seal your lips tightly around it. 5. Breathe in slowly and as deeply as possible, raising the piston or the ball toward the top of the column. 6. Hold your breath for 3-5 seconds or for as long as possible. Allow the piston or ball to fall to the bottom of the column. 7. Remove the mouthpiece from your mouth and breathe out normally. 8. Rest for a few seconds and repeat Steps 1 through 7 at least 10 times every 1-2 hours when you are awake. Take your time and take a few normal breaths between deep breaths. 9. The spirometer may include an indicator to show your best effort. Use the indicator as a goal to work toward during each repetition. 10. After each set of 10 deep breaths, practice coughing to be sure your lungs are clear. If you have an incision (the cut made at the time of surgery), support your incision when coughing by placing a pillow or rolled up towels firmly against it. Once you are able to get out of bed, walk around indoors and cough well. You may stop using the incentive spirometer when  instructed by your caregiver.  RISKS AND COMPLICATIONS  Take your time so you do not get dizzy or light-headed.  If you are in pain, you may need to take or ask for pain medication before doing incentive spirometry. It is harder to take a deep breath if you are having pain. AFTER USE  Rest and breathe slowly and easily.  It can be helpful to keep track of a log of your progress. Your caregiver can provide you with a simple table to help with this. If you are using the spirometer at home, follow these instructions: [place] IF:   You are having difficultly using the spirometer.  You have trouble using the spirometer as often as instructed.  Your pain medication is not giving enough relief while using the spirometer.  You develop fever of 100.5 F (38.1 C) or higher. SEEK IMMEDIATE MEDICAL CARE IF:   You cough up bloody sputum that had not been present before.  You develop fever of 102 F (38.9 C)  or greater.  You develop worsening pain at or near the incision site. MAKE SURE YOU:   Understand these instructions.  Will watch your condition.  Will get help right away if you are not doing well or get worse. Document Released: 10/13/2006 Document Revised: 08/25/2011 Document Reviewed: 12/14/2006 ExitCare Patient Information 2014 ExitCare, [place].   ________________________________________________________________________  WHAT IS A BLOOD TRANSFUSION? Blood Transfusion Information  A transfusion is the replacement of blood or some of its parts. Blood is made up of multiple cells which provide different functions.  Red blood cells carry oxygen and are used for blood loss replacement.  White blood cells fight against infection.  Platelets control bleeding.  Plasma helps clot blood.  Other blood products are available for specialized needs, such as hemophilia or other clotting disorders. BEFORE THE TRANSFUSION  Who gives blood for transfusions?   Healthy  volunteers who are fully evaluated to make sure their blood is safe. This is blood bank blood. Transfusion therapy is the safest it has ever been in the practice of medicine. Before blood is taken from a donor, a complete history is taken to make sure that person has no history of diseases nor engages in risky social behavior (examples are intravenous drug use or sexual activity with multiple partners). The donor's travel history is screened to minimize risk of transmitting infections, such as malaria. The donated blood is tested for signs of infectious diseases, such as HIV and hepatitis. The blood is then tested to be sure it is compatible with you in order to minimize the chance of a transfusion reaction. If you or a relative donates blood, this is often done in anticipation of surgery and is not appropriate for emergency situations. It takes many days to process the donated blood. RISKS AND COMPLICATIONS Although transfusion therapy is very safe and saves many lives, the main dangers of transfusion include:   Getting an infectious disease.  Developing a transfusion reaction. This is an allergic reaction to something in the blood you were given. Every precaution is taken to prevent this. The decision to have a blood transfusion has been considered carefully by your caregiver before blood is given. Blood is not given unless the benefits outweigh the risks. AFTER THE TRANSFUSION  Right after receiving a blood transfusion, you will usually feel much better and more energetic. This is especially true if your red blood cells have gotten low (anemic). The transfusion raises the level of the red blood cells which carry oxygen, and this usually causes an energy increase.  The nurse administering the transfusion will monitor you carefully for complications. HOME CARE INSTRUCTIONS  No special instructions are needed after a transfusion. You may find your energy is better. Speak with your caregiver about any  limitations on activity for underlying diseases you may have. SEEK MEDICAL CARE IF:   Your condition is not improving after your transfusion.  You develop redness or irritation at the intravenous (IV) site. SEEK IMMEDIATE MEDICAL CARE IF:  Any of the following symptoms occur over the next 12 hours:  Shaking chills.  You have a temperature by mouth above 102 F (38.9 C), not controlled by medicine.  Chest, back, or muscle pain.  People around you feel you are not acting correctly or are confused.  Shortness of breath or difficulty breathing.  Dizziness and fainting.  You get a rash or develop hives.  You have a decrease in urine output.  Your urine turns a dark color or changes to pink, red,  or brown. Any of the following symptoms occur over the next 10 days:  You have a temperature by mouth above 102 F (38.9 C), not controlled by medicine.  Shortness of breath.  Weakness after normal activity.  The white part of the eye turns yellow (jaundice).  You have a decrease in the amount of urine or are urinating less often.  Your urine turns a dark color or changes to pink, red, or brown. Document Released: 05/30/2000 Document Revised: 08/25/2011 Document Reviewed: 01/17/2008 [facility] Patient Information 2014 [place], [place].  _______________________________________________________________________

## 2017-05-14 NOTE — Progress Notes (Signed)
[place] BURNS  05-11-17 Epic   CBDDIFF, CMP, HGA1C 05-11-17 Epic

## 2017-05-15 ENCOUNTER — Encounter (INDEPENDENT_AMBULATORY_CARE_PROVIDER_SITE_OTHER): Payer: Self-pay

## 2017-05-15 ENCOUNTER — Other Ambulatory Visit: Payer: Self-pay

## 2017-05-15 ENCOUNTER — Encounter (HOSPITAL_COMMUNITY)
Admission: RE | Admit: 2017-05-15 | Discharge: 2017-05-15 | Disposition: A | Discharge status: Home / Self Care | Payer: 59 | Source: Ambulatory Visit | Attending: Orthopedic Surgery | Admitting: Orthopedic Surgery

## 2017-05-15 ENCOUNTER — Encounter (HOSPITAL_COMMUNITY): Payer: Self-pay

## 2017-05-15 DIAGNOSIS — I10 Essential (primary) hypertension: Secondary | ICD-10-CM | POA: Insufficient documentation

## 2017-05-15 DIAGNOSIS — M1612 Unilateral primary osteoarthritis, left hip: Secondary | ICD-10-CM | POA: Diagnosis not present

## 2017-05-15 DIAGNOSIS — Z01812 Encounter for preprocedural laboratory examination: Secondary | ICD-10-CM | POA: Diagnosis not present

## 2017-05-15 DIAGNOSIS — Z01810 Encounter for preprocedural cardiovascular examination: Secondary | ICD-10-CM | POA: Diagnosis present

## 2017-05-15 DIAGNOSIS — E119 Type 2 diabetes mellitus without complications: Secondary | ICD-10-CM | POA: Diagnosis not present

## 2017-05-15 LAB — GLUCOSE, CAPILLARY: Glucose-Capillary: 106 mg/dL — ABNORMAL HIGH (ref 65–99)

## 2017-05-15 LAB — PROTIME-INR
INR: 1.02
Prothrombin Time: 13.3 seconds (ref 11.4–15.2)

## 2017-05-15 LAB — SURGICAL PCR SCREEN
MRSA, PCR: NEGATIVE
STAPHYLOCOCCUS AUREUS: NEGATIVE

## 2017-05-15 LAB — APTT: aPTT: 24 seconds (ref 24–36)

## 2017-05-15 NOTE — Progress Notes (Signed)
   05/15/17 1430  OBSTRUCTIVE SLEEP APNEA  Have you ever been diagnosed with sleep apnea through a sleep study? No  Do you snore loudly (loud enough to be heard through closed doors)?  1  Do you often feel tired, fatigued, or sleepy during the daytime (such as falling asleep during driving or talking to someone)? 1 (work related long hours;)  Has anyone observed you stop breathing during your sleep? 0  Do you have, or are you being treated for high blood pressure? 1  BMI more than 35 kg/m2? 1  Age > 50 (1-yes) 1  Neck circumference greater than:Female 16 inches or larger, Female 17inches or larger? 1  Female Gender (Yes=1) 0  Obstructive Sleep Apnea Score 6

## 2017-05-16 LAB — ABO/RH: ABO/RH(D): B POS

## 2017-05-20 ENCOUNTER — Encounter (HOSPITAL_COMMUNITY): Payer: Self-pay | Admitting: *Deleted

## 2017-05-20 ENCOUNTER — Inpatient Hospital Stay (HOSPITAL_COMMUNITY): Payer: 59

## 2017-05-20 ENCOUNTER — Inpatient Hospital Stay (HOSPITAL_COMMUNITY): Payer: 59 | Admitting: Anesthesiology

## 2017-05-20 ENCOUNTER — Other Ambulatory Visit: Payer: Self-pay

## 2017-05-20 ENCOUNTER — Encounter (HOSPITAL_COMMUNITY)
Admission: RE | Disposition: A | Discharge status: Home / Self Care | Payer: Self-pay | Source: Ambulatory Visit | Attending: Orthopedic Surgery

## 2017-05-20 ENCOUNTER — Inpatient Hospital Stay (HOSPITAL_COMMUNITY)
Admission: RE | Admit: 2017-05-20 | Discharge: 2017-05-21 | DRG: 470 | Disposition: A | Discharge status: Home / Self Care | Payer: 59 | Source: Ambulatory Visit | Attending: Orthopedic Surgery | Admitting: Orthopedic Surgery

## 2017-05-20 DIAGNOSIS — Y831 Surgical operation with implant of artificial internal device as the cause of abnormal reaction of the patient, or of later complication, without mention of misadventure at the time of the procedure: Secondary | ICD-10-CM | POA: Diagnosis present

## 2017-05-20 DIAGNOSIS — Z96641 Presence of right artificial hip joint: Secondary | ICD-10-CM | POA: Diagnosis present

## 2017-05-20 DIAGNOSIS — Z8249 Family history of ischemic heart disease and other diseases of the circulatory system: Secondary | ICD-10-CM | POA: Diagnosis not present

## 2017-05-20 DIAGNOSIS — E7800 Pure hypercholesterolemia, unspecified: Secondary | ICD-10-CM | POA: Diagnosis present

## 2017-05-20 DIAGNOSIS — Z823 Family history of stroke: Secondary | ICD-10-CM

## 2017-05-20 DIAGNOSIS — Z87410 Personal history of cervical dysplasia: Secondary | ICD-10-CM | POA: Diagnosis not present

## 2017-05-20 DIAGNOSIS — Z96652 Presence of left artificial knee joint: Secondary | ICD-10-CM | POA: Diagnosis present

## 2017-05-20 DIAGNOSIS — H409 Unspecified glaucoma: Secondary | ICD-10-CM | POA: Diagnosis present

## 2017-05-20 DIAGNOSIS — E119 Type 2 diabetes mellitus without complications: Secondary | ICD-10-CM | POA: Diagnosis present

## 2017-05-20 DIAGNOSIS — M169 Osteoarthritis of hip, unspecified: Secondary | ICD-10-CM | POA: Diagnosis present

## 2017-05-20 DIAGNOSIS — T8484XA Pain due to internal orthopedic prosthetic devices, implants and grafts, initial encounter: Secondary | ICD-10-CM | POA: Diagnosis present

## 2017-05-20 DIAGNOSIS — M1612 Unilateral primary osteoarthritis, left hip: Secondary | ICD-10-CM | POA: Diagnosis present

## 2017-05-20 DIAGNOSIS — Z87891 Personal history of nicotine dependence: Secondary | ICD-10-CM | POA: Diagnosis not present

## 2017-05-20 DIAGNOSIS — G8929 Other chronic pain: Secondary | ICD-10-CM | POA: Diagnosis present

## 2017-05-20 DIAGNOSIS — I10 Essential (primary) hypertension: Secondary | ICD-10-CM | POA: Diagnosis present

## 2017-05-20 DIAGNOSIS — Z96649 Presence of unspecified artificial hip joint: Secondary | ICD-10-CM

## 2017-05-20 HISTORY — PX: TOTAL HIP ARTHROPLASTY: SHX124

## 2017-05-20 LAB — GLUCOSE, CAPILLARY
GLUCOSE-CAPILLARY: 136 mg/dL — AB (ref 65–99)
GLUCOSE-CAPILLARY: 176 mg/dL — AB (ref 65–99)
Glucose-Capillary: 131 mg/dL — ABNORMAL HIGH (ref 65–99)
Glucose-Capillary: 133 mg/dL — ABNORMAL HIGH (ref 65–99)

## 2017-05-20 LAB — TYPE AND SCREEN
ABO/RH(D): B POS
ANTIBODY SCREEN: NEGATIVE

## 2017-05-20 SURGERY — ARTHROPLASTY, HIP, TOTAL, ANTERIOR APPROACH
Anesthesia: General | Site: Hip | Laterality: Left

## 2017-05-20 MED ORDER — ROCURONIUM BROMIDE 50 MG/5ML IV SOSY
PREFILLED_SYRINGE | INTRAVENOUS | Status: AC
  Filled 2017-05-20: qty 5

## 2017-05-20 MED ORDER — ONDANSETRON HCL 4 MG/2ML IJ SOLN
INTRAMUSCULAR | Status: AC
  Filled 2017-05-20: qty 2

## 2017-05-20 MED ORDER — DOCUSATE SODIUM 100 MG PO CAPS
100.0000 mg | ORAL_CAPSULE | Freq: Two times a day (BID) | ORAL | Status: DC
  Administered 2017-05-20 – 2017-05-21 (×2): 100 mg via ORAL
  Filled 2017-05-20 (×2): qty 1

## 2017-05-20 MED ORDER — FLEET ENEMA 7-19 GM/118ML RE ENEM: 1.0000 | ENEMA | Freq: Once | RECTAL | Status: DC | PRN

## 2017-05-20 MED ORDER — ACETAMINOPHEN 650 MG RE SUPP: 650.0000 mg | RECTAL | Status: DC | PRN

## 2017-05-20 MED ORDER — FENTANYL CITRATE (PF) 100 MCG/2ML IJ SOLN
INTRAMUSCULAR | Status: DC | PRN
  Administered 2017-05-20 (×2): 50 ug via INTRAVENOUS

## 2017-05-20 MED ORDER — SUCCINYLCHOLINE CHLORIDE 200 MG/10ML IV SOSY
PREFILLED_SYRINGE | INTRAVENOUS | Status: AC
  Filled 2017-05-20: qty 10

## 2017-05-20 MED ORDER — LOSARTAN POTASSIUM 50 MG PO TABS: 100.0000 mg | ORAL_TABLET | Freq: Every day | ORAL | Status: DC

## 2017-05-20 MED ORDER — SCOPOLAMINE 1 MG/3DAYS TD PT72
MEDICATED_PATCH | TRANSDERMAL | Status: DC | PRN
  Administered 2017-05-20: 1 via TRANSDERMAL

## 2017-05-20 MED ORDER — ACETAMINOPHEN 500 MG PO TABS
1000.0000 mg | ORAL_TABLET | Freq: Four times a day (QID) | ORAL | Status: AC
  Administered 2017-05-20 – 2017-05-21 (×4): 1000 mg via ORAL
  Filled 2017-05-20 (×4): qty 2

## 2017-05-20 MED ORDER — SCOPOLAMINE 1 MG/3DAYS TD PT72
MEDICATED_PATCH | TRANSDERMAL | Status: AC
  Filled 2017-05-20: qty 1

## 2017-05-20 MED ORDER — CEFAZOLIN SODIUM-DEXTROSE 2-4 GM/100ML-% IV SOLN
2.0000 g | INTRAVENOUS | Status: AC
  Administered 2017-05-20: 2 g via INTRAVENOUS

## 2017-05-20 MED ORDER — METOCLOPRAMIDE HCL 5 MG PO TABS: 5.0000 mg | ORAL_TABLET | Freq: Three times a day (TID) | ORAL | Status: DC | PRN

## 2017-05-20 MED ORDER — ONDANSETRON HCL 4 MG/2ML IJ SOLN: 4.0000 mg | Freq: Four times a day (QID) | INTRAMUSCULAR | Status: DC | PRN

## 2017-05-20 MED ORDER — SUCCINYLCHOLINE CHLORIDE 200 MG/10ML IV SOSY
PREFILLED_SYRINGE | INTRAVENOUS | Status: DC | PRN
  Administered 2017-05-20: 120 mg via INTRAVENOUS

## 2017-05-20 MED ORDER — HYDROMORPHONE HCL 1 MG/ML IJ SOLN
INTRAMUSCULAR | Status: AC
  Administered 2017-05-20: 0.25 mg via INTRAVENOUS
  Filled 2017-05-20: qty 1

## 2017-05-20 MED ORDER — PROPOFOL 10 MG/ML IV BOLUS
INTRAVENOUS | Status: DC | PRN
  Administered 2017-05-20: 200 mg via INTRAVENOUS

## 2017-05-20 MED ORDER — ATORVASTATIN CALCIUM 20 MG PO TABS
20.0000 mg | ORAL_TABLET | Freq: Every day | ORAL | Status: DC
  Administered 2017-05-21: 20 mg via ORAL
  Filled 2017-05-20: qty 1

## 2017-05-20 MED ORDER — FENTANYL CITRATE (PF) 100 MCG/2ML IJ SOLN
INTRAMUSCULAR | Status: AC
  Filled 2017-05-20: qty 2

## 2017-05-20 MED ORDER — DEXAMETHASONE SODIUM PHOSPHATE 10 MG/ML IJ SOLN
INTRAMUSCULAR | Status: AC
  Filled 2017-05-20: qty 1

## 2017-05-20 MED ORDER — OXYCODONE HCL 5 MG PO TABS: 10.0000 mg | ORAL_TABLET | ORAL | Status: DC | PRN

## 2017-05-20 MED ORDER — LATANOPROST 0.005 % OP SOLN
1.0000 [drp] | Freq: Every day | OPHTHALMIC | Status: DC
  Administered 2017-05-20: 1 [drp] via OPHTHALMIC
  Filled 2017-05-20: qty 2.5

## 2017-05-20 MED ORDER — PHENYLEPHRINE HCL 10 MG/ML IJ SOLN
INTRAMUSCULAR | Status: AC
  Filled 2017-05-20: qty 1

## 2017-05-20 MED ORDER — HYDROMORPHONE HCL 1 MG/ML IJ SOLN
0.2500 mg | INTRAMUSCULAR | Status: DC | PRN
Start: 2017-05-20 — End: 2017-05-20
  Administered 2017-05-20 (×5): 0.25 mg via INTRAVENOUS

## 2017-05-20 MED ORDER — CEFAZOLIN SODIUM-DEXTROSE 2-4 GM/100ML-% IV SOLN
INTRAVENOUS | Status: AC
  Filled 2017-05-20: qty 100

## 2017-05-20 MED ORDER — ROCURONIUM BROMIDE 10 MG/ML (PF) SYRINGE
PREFILLED_SYRINGE | INTRAVENOUS | Status: DC | PRN
  Administered 2017-05-20: 10 mg via INTRAVENOUS
  Administered 2017-05-20: 50 mg via INTRAVENOUS

## 2017-05-20 MED ORDER — ACETAMINOPHEN 10 MG/ML IV SOLN
INTRAVENOUS | Status: AC
  Filled 2017-05-20: qty 100

## 2017-05-20 MED ORDER — METHOCARBAMOL 500 MG PO TABS
500.0000 mg | ORAL_TABLET | Freq: Four times a day (QID) | ORAL | Status: DC | PRN
  Administered 2017-05-20 – 2017-05-21 (×2): 500 mg via ORAL
  Filled 2017-05-20 (×2): qty 1

## 2017-05-20 MED ORDER — SODIUM CHLORIDE 0.9 % IV SOLN
1000.0000 mg | Freq: Once | INTRAVENOUS | Status: AC
  Administered 2017-05-20: 1000 mg via INTRAVENOUS
  Filled 2017-05-20: qty 1100

## 2017-05-20 MED ORDER — PHENYLEPHRINE HCL 10 MG/ML IJ SOLN
INTRAVENOUS | Status: DC | PRN
  Administered 2017-05-20: 50 ug/min via INTRAVENOUS

## 2017-05-20 MED ORDER — RIVAROXABAN 10 MG PO TABS
10.0000 mg | ORAL_TABLET | Freq: Every day | ORAL | Status: DC
  Administered 2017-05-21: 10 mg via ORAL
  Filled 2017-05-20: qty 1

## 2017-05-20 MED ORDER — INSULIN ASPART 100 UNIT/ML ~~LOC~~ SOLN
0.0000 [IU] | Freq: Three times a day (TID) | SUBCUTANEOUS | Status: DC
  Administered 2017-05-21: 2 [IU] via SUBCUTANEOUS

## 2017-05-20 MED ORDER — ACETAMINOPHEN 325 MG PO TABS: 650.0000 mg | ORAL_TABLET | ORAL | Status: DC | PRN

## 2017-05-20 MED ORDER — POLYETHYLENE GLYCOL 3350 17 G PO PACK: 17.0000 g | PACK | Freq: Every day | ORAL | Status: DC | PRN

## 2017-05-20 MED ORDER — LACTATED RINGERS IV SOLN: INTRAVENOUS | Status: DC

## 2017-05-20 MED ORDER — MIDAZOLAM HCL 2 MG/2ML IJ SOLN
INTRAMUSCULAR | Status: AC
  Filled 2017-05-20: qty 2

## 2017-05-20 MED ORDER — STERILE WATER FOR IRRIGATION IR SOLN
Status: DC | PRN
  Administered 2017-05-20: 2000 mL

## 2017-05-20 MED ORDER — CHLORHEXIDINE GLUCONATE 4 % EX LIQD: 60.0000 mL | Freq: Once | CUTANEOUS | Status: DC

## 2017-05-20 MED ORDER — PROMETHAZINE HCL 25 MG/ML IJ SOLN: 6.2500 mg | INTRAMUSCULAR | Status: DC | PRN

## 2017-05-20 MED ORDER — BUPIVACAINE HCL (PF) 0.25 % IJ SOLN
INTRAMUSCULAR | Status: DC | PRN
Start: 2017-05-20 — End: 2017-05-20
  Administered 2017-05-20: 30 mL

## 2017-05-20 MED ORDER — CITALOPRAM HYDROBROMIDE 20 MG PO TABS
40.0000 mg | ORAL_TABLET | Freq: Every day | ORAL | Status: DC
  Administered 2017-05-21: 40 mg via ORAL
  Filled 2017-05-20: qty 2

## 2017-05-20 MED ORDER — MORPHINE SULFATE (PF) 4 MG/ML IV SOLN: 1.0000 mg | INTRAVENOUS | Status: DC | PRN

## 2017-05-20 MED ORDER — ONDANSETRON HCL 4 MG/2ML IJ SOLN
INTRAMUSCULAR | Status: DC | PRN
  Administered 2017-05-20: 4 mg via INTRAVENOUS

## 2017-05-20 MED ORDER — LIDOCAINE 2% (20 MG/ML) 5 ML SYRINGE
INTRAMUSCULAR | Status: DC | PRN
  Administered 2017-05-20: 100 mg via INTRAVENOUS

## 2017-05-20 MED ORDER — AMLODIPINE BESYLATE 5 MG PO TABS: 5.0000 mg | ORAL_TABLET | Freq: Every day | ORAL | Status: DC

## 2017-05-20 MED ORDER — BUPIVACAINE HCL (PF) 0.25 % IJ SOLN
INTRAMUSCULAR | Status: AC
  Filled 2017-05-20: qty 30

## 2017-05-20 MED ORDER — ACETAMINOPHEN 10 MG/ML IV SOLN
1000.0000 mg | Freq: Once | INTRAVENOUS | Status: AC
  Administered 2017-05-20: 1000 mg via INTRAVENOUS

## 2017-05-20 MED ORDER — DEXAMETHASONE SODIUM PHOSPHATE 10 MG/ML IJ SOLN
10.0000 mg | Freq: Once | INTRAMUSCULAR | Status: AC
  Administered 2017-05-20: 10 mg via INTRAVENOUS

## 2017-05-20 MED ORDER — PHENOL 1.4 % MT LIQD: 1.0000 | OROMUCOSAL | Status: DC | PRN

## 2017-05-20 MED ORDER — OXYCODONE HCL 5 MG PO TABS
5.0000 mg | ORAL_TABLET | ORAL | Status: DC | PRN
  Administered 2017-05-20 – 2017-05-21 (×6): 5 mg via ORAL
  Filled 2017-05-20 (×5): qty 1

## 2017-05-20 MED ORDER — MENTHOL 3 MG MT LOZG: 1.0000 | LOZENGE | OROMUCOSAL | Status: DC | PRN

## 2017-05-20 MED ORDER — BUPIVACAINE HCL (PF) 0.5 % IJ SOLN
INTRAMUSCULAR | Status: AC
  Filled 2017-05-20: qty 30

## 2017-05-20 MED ORDER — SODIUM CHLORIDE 0.9 % IV SOLN
INTRAVENOUS | Status: DC
  Administered 2017-05-20: 15:00:00 via INTRAVENOUS

## 2017-05-20 MED ORDER — OXYCODONE HCL 5 MG PO TABS
ORAL_TABLET | ORAL | Status: AC
  Administered 2017-05-20: 5 mg via ORAL
  Filled 2017-05-20: qty 1

## 2017-05-20 MED ORDER — TRANEXAMIC ACID 1000 MG/10ML IV SOLN
1000.0000 mg | INTRAVENOUS | Status: AC
  Administered 2017-05-20: 1000 mg via INTRAVENOUS
  Filled 2017-05-20: qty 1100

## 2017-05-20 MED ORDER — LIDOCAINE 2% (20 MG/ML) 5 ML SYRINGE
INTRAMUSCULAR | Status: AC
  Filled 2017-05-20: qty 5

## 2017-05-20 MED ORDER — METOCLOPRAMIDE HCL 5 MG/ML IJ SOLN: 5.0000 mg | Freq: Three times a day (TID) | INTRAMUSCULAR | Status: DC | PRN

## 2017-05-20 MED ORDER — METHOCARBAMOL 1000 MG/10ML IJ SOLN
500.0000 mg | Freq: Four times a day (QID) | INTRAVENOUS | Status: DC | PRN
  Administered 2017-05-20: 500 mg via INTRAVENOUS
  Filled 2017-05-20: qty 550

## 2017-05-20 MED ORDER — SUGAMMADEX SODIUM 500 MG/5ML IV SOLN
INTRAVENOUS | Status: AC
  Filled 2017-05-20: qty 5

## 2017-05-20 MED ORDER — CEFAZOLIN SODIUM-DEXTROSE 2-4 GM/100ML-% IV SOLN
2.0000 g | Freq: Four times a day (QID) | INTRAVENOUS | Status: AC
  Administered 2017-05-20 (×2): 2 g via INTRAVENOUS
  Filled 2017-05-20 (×2): qty 100

## 2017-05-20 MED ORDER — PROPOFOL 10 MG/ML IV BOLUS
INTRAVENOUS | Status: AC
  Filled 2017-05-20: qty 40

## 2017-05-20 MED ORDER — MIDAZOLAM HCL 5 MG/5ML IJ SOLN
INTRAMUSCULAR | Status: DC | PRN
  Administered 2017-05-20: 2 mg via INTRAVENOUS

## 2017-05-20 MED ORDER — BISACODYL 10 MG RE SUPP: 10.0000 mg | Freq: Every day | RECTAL | Status: DC | PRN

## 2017-05-20 MED ORDER — PHENYLEPHRINE 40 MCG/ML (10ML) SYRINGE FOR IV PUSH (FOR BLOOD PRESSURE SUPPORT)
PREFILLED_SYRINGE | INTRAVENOUS | Status: AC
  Filled 2017-05-20: qty 10

## 2017-05-20 MED ORDER — PHENYLEPHRINE 40 MCG/ML (10ML) SYRINGE FOR IV PUSH (FOR BLOOD PRESSURE SUPPORT)
PREFILLED_SYRINGE | INTRAVENOUS | Status: DC | PRN
  Administered 2017-05-20: 120 ug via INTRAVENOUS
  Administered 2017-05-20: 80 ug via INTRAVENOUS
  Administered 2017-05-20: 120 ug via INTRAVENOUS
  Administered 2017-05-20: 80 ug via INTRAVENOUS

## 2017-05-20 MED ORDER — ROCURONIUM BROMIDE 50 MG/5ML IV SOSY
PREFILLED_SYRINGE | INTRAVENOUS | Status: AC
Start: 2017-05-20 — End: 2017-05-20
  Filled 2017-05-20: qty 5

## 2017-05-20 MED ORDER — DIPHENHYDRAMINE HCL 12.5 MG/5ML PO ELIX: 12.5000 mg | ORAL_SOLUTION | ORAL | Status: DC | PRN

## 2017-05-20 MED ORDER — SUGAMMADEX SODIUM 500 MG/5ML IV SOLN
INTRAVENOUS | Status: DC | PRN
  Administered 2017-05-20: 300 mg via INTRAVENOUS

## 2017-05-20 MED ORDER — MEPERIDINE HCL 50 MG/ML IJ SOLN: 6.2500 mg | INTRAMUSCULAR | Status: DC | PRN

## 2017-05-20 MED ORDER — DEXAMETHASONE SODIUM PHOSPHATE 10 MG/ML IJ SOLN
10.0000 mg | Freq: Once | INTRAMUSCULAR | Status: AC
  Administered 2017-05-21: 10 mg via INTRAVENOUS
  Filled 2017-05-20: qty 1

## 2017-05-20 MED ORDER — LACTATED RINGERS IV SOLN
INTRAVENOUS | Status: DC
  Administered 2017-05-20 (×2): via INTRAVENOUS

## 2017-05-20 MED ORDER — ONDANSETRON HCL 4 MG PO TABS: 4.0000 mg | ORAL_TABLET | Freq: Four times a day (QID) | ORAL | Status: DC | PRN

## 2017-05-20 MED ORDER — 0.9 % SODIUM CHLORIDE (POUR BTL) OPTIME
TOPICAL | Status: DC | PRN
  Administered 2017-05-20: 1000 mL

## 2017-05-20 SURGICAL SUPPLY — 39 items
BAG DECANTER FOR FLEXI CONT (MISCELLANEOUS) ×2 IMPLANT
BAG ZIPLOCK 12X15 (MISCELLANEOUS) ×4 IMPLANT
BLADE SAG 18X100X1.27 (BLADE) ×2 IMPLANT
CAPT HIP TOTAL 2 ×2 IMPLANT
CLOTH BEACON ORANGE TIMEOUT ST (SAFETY) ×2 IMPLANT
COVER PERINEAL POST (MISCELLANEOUS) ×2 IMPLANT
COVER SURGICAL LIGHT HANDLE (MISCELLANEOUS) ×2 IMPLANT
DECANTER SPIKE VIAL GLASS SM (MISCELLANEOUS) ×2 IMPLANT
DRAPE STERI IOBAN 125X83 (DRAPES) ×2 IMPLANT
DRAPE U-SHAPE 47X51 STRL (DRAPES) ×4 IMPLANT
DRSG ADAPTIC 3X8 NADH LF (GAUZE/BANDAGES/DRESSINGS) ×2 IMPLANT
DRSG MEPILEX BORDER 4X4 (GAUZE/BANDAGES/DRESSINGS) ×2 IMPLANT
DRSG MEPILEX BORDER 4X8 (GAUZE/BANDAGES/DRESSINGS) ×2 IMPLANT
DURAPREP 26ML APPLICATOR (WOUND CARE) ×2 IMPLANT
ELECT REM PT RETURN 15FT ADLT (MISCELLANEOUS) ×2 IMPLANT
EVACUATOR 1/8 PVC DRAIN (DRAIN) ×2 IMPLANT
GLOVE BIO SURGEON STRL SZ7.5 (GLOVE) ×2 IMPLANT
GLOVE BIO SURGEON STRL SZ8 (GLOVE) ×4 IMPLANT
GLOVE BIOGEL PI IND STRL 7.0 (GLOVE) ×2 IMPLANT
GLOVE BIOGEL PI IND STRL 7.5 (GLOVE) ×3 IMPLANT
GLOVE BIOGEL PI IND STRL 8 (GLOVE) ×2 IMPLANT
GLOVE BIOGEL PI INDICATOR 7.0 (GLOVE) ×2
GLOVE BIOGEL PI INDICATOR 7.5 (GLOVE) ×3
GLOVE BIOGEL PI INDICATOR 8 (GLOVE) ×2
GLOVE ECLIPSE 7.0 STRL STRAW (GLOVE) ×2 IMPLANT
GLOVE SURG SS PI 7.5 STRL IVOR (GLOVE) ×4 IMPLANT
GOWN STRL REUS W/ TWL XL LVL3 (GOWN DISPOSABLE) ×1 IMPLANT
GOWN STRL REUS W/TWL LRG LVL3 (GOWN DISPOSABLE) ×4 IMPLANT
GOWN STRL REUS W/TWL XL LVL3 (GOWN DISPOSABLE) ×5 IMPLANT
PACK ANTERIOR HIP CUSTOM (KITS) ×2 IMPLANT
STRIP CLOSURE SKIN 1/2X4 (GAUZE/BANDAGES/DRESSINGS) ×4 IMPLANT
SUT ETHIBOND NAB CT1 #1 30IN (SUTURE) ×2 IMPLANT
SUT MNCRL AB 4-0 PS2 18 (SUTURE) ×2 IMPLANT
SUT STRATAFIX 0 PDS 27 VIOLET (SUTURE) ×2
SUT VIC AB 2-0 CT1 27 (SUTURE) ×2
SUT VIC AB 2-0 CT1 TAPERPNT 27 (SUTURE) ×2 IMPLANT
SUTURE STRATFX 0 PDS 27 VIOLET (SUTURE) ×1 IMPLANT
SYR 50ML LL SCALE MARK (SYRINGE) IMPLANT
YANKAUER SUCT BULB TIP 10FT TU (MISCELLANEOUS) ×2 IMPLANT

## 2017-05-20 NOTE — Interval H&P Note (Signed)
  History and Physical Interval Note:  05/20/2017 8:01 AM  Katherine [facility]  has presented today for surgery, with the diagnosis of Osteoarthritis left Hip   The various methods of treatment have been discussed with the patient and family. After consideration of risks, benefits and other options for treatment, the patient has consented to  Procedure(s): LEFT TOTAL HIP ARTHROPLASTY ANTERIOR APPROACH (Left) as a surgical intervention .  The patient's history has been reviewed, patient examined, no change in status, stable for surgery.  I have reviewed the patient's chart and labs.  Questions were answered to the patient's satisfaction.     Katherine [facility]

## 2017-05-20 NOTE — Discharge Instructions (Addendum)
° °Dr. Frank Aluisio °Total Joint Specialist °[facility] °[address] °[place], [place] [address] °[phone] ° °ANTERIOR APPROACH TOTAL HIP REPLACEMENT POSTOPERATIVE DIRECTIONS ° ° °Hip Rehabilitation, Guidelines Following Surgery  °The results of a hip operation are greatly improved after range of motion and muscle strengthening exercises. Follow all safety measures which are given to protect your hip. If any of these exercises cause increased pain or swelling in your joint, decrease the amount until you are comfortable again. Then slowly increase the exercises. Call your caregiver if you have problems or questions.  ° °HOME CARE INSTRUCTIONS  °Remove items at home which could result in a fall. This includes throw rugs or furniture in walking pathways.  °· ICE to the affected hip every three hours for 30 minutes at a time and then as needed for pain and swelling.  Continue to use ice on the hip for pain and swelling from surgery. You may notice swelling that will progress down to the foot and ankle.  This is normal after surgery.  Elevate the leg when you are not up walking on it.   °· Continue to use the breathing machine which will help keep your temperature down.  It is common for your temperature to cycle up and down following surgery, especially at night when you are not up moving around and exerting yourself.  The breathing machine keeps your lungs expanded and your temperature down. ° ° °DIET °You may resume your previous home diet once your are discharged from the hospital. ° °DRESSING / WOUND CARE / SHOWERING °You may shower 3 days after surgery, but keep the wounds dry during showering.  You may use an occlusive plastic wrap (Press'n Seal for example), NO SOAKING/SUBMERGING IN THE BATHTUB.  If the bandage gets wet, change with a clean dry gauze.  If the incision gets wet, pat the wound dry with a clean towel. °You may start showering once you are discharged home but do not  submerge the incision under water. Just pat the incision dry and apply a dry gauze dressing on daily. °Change the surgical dressing daily and reapply a dry dressing each time. ° °ACTIVITY °Walk with your walker as instructed. °Use walker as long as suggested by your caregivers. °Avoid periods of inactivity such as sitting longer than an hour when not asleep. This helps prevent blood clots.  °You may resume a sexual relationship in one month or when given the OK by your doctor.  °You may return to work once you are cleared by your doctor.  °Do not drive a car for 6 weeks or until released by you surgeon.  °Do not drive while taking narcotics. ° °WEIGHT BEARING °Weight bearing as tolerated with assist device (walker, cane, etc) as directed, use it as long as suggested by your surgeon or therapist, typically at least 4-6 weeks. ° °POSTOPERATIVE CONSTIPATION PROTOCOL °Constipation - defined medically as fewer than three stools per week and severe constipation as less than one stool per week. ° °One of the most common issues patients have following surgery is constipation.  Even if you have a regular bowel pattern at home, your normal regimen is likely to be disrupted due to multiple reasons following surgery.  Combination of anesthesia, postoperative narcotics, change in appetite and fluid intake all can affect your bowels.  In order to avoid complications following surgery, here are some recommendations in order to help you during your recovery period. ° °Colace (docusate) - Pick up an over-the-counter   form of Colace or another stool softener and take twice a day as long as you are requiring postoperative pain medications.  Take with a full glass of water daily.  If you experience loose stools or diarrhea, hold the colace until you stool forms back up.  If your symptoms do not get better within 1 week or if they get worse, check with your doctor. ° °Dulcolax (bisacodyl) - Pick up over-the-counter and take as directed  by the product packaging as needed to assist with the movement of your bowels.  Take with a full glass of water.  Use this product as needed if not relieved by Colace only.  ° °MiraLax (polyethylene glycol) - Pick up over-the-counter to have on hand.  MiraLax is a solution that will increase the amount of water in your bowels to assist with bowel movements.  Take as directed and can mix with a glass of water, juice, soda, coffee, or tea.  Take if you go more than two days without a movement. °Do not use MiraLax more than once per day. Call your doctor if you are still constipated or irregular after using this medication for 7 days in a row. ° °If you continue to have problems with postoperative constipation, please contact the office for further assistance and recommendations.  If you experience "the worst abdominal pain ever" or develop nausea or vomiting, please contact the office immediatly for further recommendations for treatment. ° °ITCHING ° If you experience itching with your medications, try taking only a single pain pill, or even half a pain pill at a time.  You can also use Benadryl over the counter for itching or also to help with sleep.  ° °TED HOSE STOCKINGS °Wear the elastic stockings on both legs for three weeks following surgery during the day but you may remove then at night for sleeping. ° °MEDICATIONS °See your medication summary on the “After Visit Summary” that the nursing staff will review with you prior to discharge.  You may have some home medications which will be placed on hold until you complete the course of blood thinner medication.  It is important for you to complete the blood thinner medication as prescribed by your surgeon.  Continue your approved medications as instructed at time of discharge. ° °PRECAUTIONS °If you experience chest pain or shortness of breath - call 911 immediately for transfer to the hospital emergency department.  °If you develop a fever greater that 101 F,  purulent drainage from wound, increased redness or drainage from wound, foul odor from the wound/dressing, or calf pain - CONTACT YOUR SURGEON.   °                                                °FOLLOW-UP APPOINTMENTS °Make sure you keep all of your appointments after your operation with your surgeon and caregivers. You should call the office at the above phone number and make an appointment for approximately two weeks after the date of your surgery or on the date instructed by your surgeon outlined in the "After Visit Summary". ° °RANGE OF MOTION AND STRENGTHENING EXERCISES  °These exercises are designed to help you keep full movement of your hip joint. Follow your caregiver's or physical therapist's instructions. Perform all exercises about fifteen times, three times per day or as directed. Exercise both hips, even if you   have had only one joint replacement. These exercises can be done on a training (exercise) mat, on the floor, on a table or on a bed. Use whatever works the best and is most comfortable for you. Use music or television while you are exercising so that the exercises are a pleasant break in your day. This will make your life better with the exercises acting as a break in routine you can look forward to.  Lying on your back, slowly slide your foot toward your buttocks, raising your knee up off the floor. Then slowly slide your foot back down until your leg is straight again.  Lying on your back spread your legs as far apart as you can without causing discomfort.  Lying on your side, raise your upper leg and foot straight up from the floor as far as is comfortable. Slowly lower the leg and repeat.  Lying on your back, tighten up the muscle in the front of your thigh (quadriceps muscles). You can do this by keeping your leg straight and trying to raise your heel off the floor. This helps strengthen the largest muscle supporting your knee.  Lying on your back, tighten up the muscles of your  buttocks both with the legs straight and with the knee bent at a comfortable angle while keeping your heel on the floor.   IF YOU ARE TRANSFERRED TO A SKILLED REHAB FACILITY If the patient is transferred to a skilled rehab facility following release from the hospital, a list of the current medications will be sent to the facility for the patient to continue.  When discharged from the skilled rehab facility, please have the facility set up the patient's [place] prior to being released. Also, the skilled facility will be responsible for providing the patient with their medications at time of release from the facility to include their pain medication, the muscle relaxants, and their blood thinner medication. If the patient is still at the rehab facility at time of the two week follow up appointment, the skilled rehab facility will also need to assist the patient in arranging follow up appointment in our office and any transportation needs.  MAKE SURE YOU:  Understand these instructions.  Get help right away if you are not doing well or get worse.    Pick up stool softner and laxative for home use following surgery while on pain medications. Do not submerge incision under water. Please use good hand washing techniques while changing dressing each day. May shower starting three days after surgery. Please use a clean towel to pat the incision dry following showers. Continue to use ice for pain and swelling after surgery. Do not use any lotions or creams on the incision until instructed by your surgeon.  Take Xarelto for two and a half more weeks following discharge from the hospital, then discontinue Xarelto. Once the patient has completed the Xarelto, they may resume the 81 mg Aspirin.    Information on my medicine - XARELTO (Rivaroxaban)  Why was Xarelto prescribed for you? Xarelto was prescribed for you to reduce the risk of blood clots forming after orthopedic surgery.  The medical term for these abnormal blood clots is venous thromboembolism (VTE).  What do you need to know about xarelto ? Take your Xarelto ONCE DAILY at the same time every day. You may take it either with or without food.  If you have difficulty swallowing the tablet whole, you may crush it and mix in applesauce just prior  to taking your dose.  Take Xarelto exactly as prescribed by your doctor and DO NOT stop taking Xarelto without talking to the doctor who prescribed the medication.  Stopping without other VTE prevention medication to take the place of Xarelto may increase your risk of developing a clot.  After discharge, you should have regular check-up appointments with your healthcare provider that is prescribing your Xarelto.    What do you do if you miss a dose? If you miss a dose, take it as soon as you remember on the same day then continue your regularly scheduled once daily regimen the next day. Do not take two doses of Xarelto on the same day.   Important Safety Information A possible side effect of Xarelto is bleeding. You should call your healthcare provider right away if you experience any of the following: ? Bleeding from an injury or your nose that does not stop. ? Unusual colored urine (red or dark brown) or unusual colored stools (red or black). ? Unusual bruising for unknown reasons. ? A serious fall or if you hit your head (even if there is no bleeding).  Some medicines may interact with Xarelto and might increase your risk of bleeding while on Xarelto. To help avoid this, consult your healthcare provider or pharmacist prior to using any new prescription or non-prescription medications, including herbals, vitamins, non-steroidal anti-inflammatory drugs (NSAIDs) and supplements.  This website has more information on Xarelto: [email or web address]

## 2017-05-20 NOTE — Anesthesia Preprocedure Evaluation (Addendum)
Anesthesia Evaluation  Patient identified by MRN, date of birth, ID band Patient awake    Reviewed: Allergy & Precautions, NPO status , Patient's Chart, lab work & pertinent test results  Airway Mallampati: II  TM Distance: >3 FB Neck ROM: Full    Dental  (+) Teeth Intact, Dental Advisory Given   Pulmonary former smoker,    breath sounds clear to auscultation       Cardiovascular hypertension, Pt. on medications + Valvular Problems/Murmurs  Rhythm:Regular Rate:Normal     Neuro/Psych PSYCHIATRIC DISORDERS Depression  Neuromuscular disease    GI/Hepatic   Endo/Other  diabetes, Type 2, Oral Hypoglycemic Agents  Renal/GU      Musculoskeletal  (+) Arthritis ,   Abdominal (+) + obese,   Peds  Hematology   Anesthesia Other Findings - HLD  Reproductive/Obstetrics                            Lab Results  Component Value Date   WBC 9.0 05/11/2017   HGB 15.4 (H) 05/11/2017   HCT 47.3 (H) 05/11/2017   MCV 88.3 05/11/2017   PLT 285.0 05/11/2017     Anesthesia Physical Anesthesia Plan  ASA: III  Anesthesia Plan: General   Post-op Pain Management:    Induction: Intravenous  PONV Risk Score and Plan: 3 and Ondansetron, Dexamethasone, Propofol infusion, Midazolam and Scopolamine patch - Pre-op  Airway Management Planned: LMA  Additional Equipment:   Intra-op Plan:   Post-operative Plan: Extubation in OR  Informed Consent: I have reviewed the patients History and Physical, chart, labs and discussed the procedure including the risks, benefits and alternatives for the proposed anesthesia with the patient or authorized representative who has indicated his/her understanding and acceptance.   Dental advisory given  Plan Discussed with: CRNA  Anesthesia Plan Comments:        Anesthesia Quick Evaluation

## 2017-05-20 NOTE — Anesthesia Postprocedure Evaluation (Signed)
Anesthesia Post Note  Patient: Katherine Lawrence  Procedure(s) Performed: LEFT TOTAL HIP ARTHROPLASTY ANTERIOR APPROACH (Left Hip)     Patient location during evaluation: PACU Anesthesia Type: General Level of consciousness: awake and alert Pain management: pain level controlled Vital Signs Assessment: post-procedure vital signs reviewed and stable Respiratory status: spontaneous breathing, nonlabored ventilation, respiratory function stable and patient connected to nasal cannula oxygen Cardiovascular status: blood pressure returned to baseline and stable Postop Assessment: no apparent nausea or vomiting Anesthetic complications: no    Last Vitals:  Vitals:   05/20/17 1600 05/20/17 1700  BP: (!) 142/69 126/61  Pulse: 68 68  Resp: 18 16  Temp: 36.8 C 37.3 C  SpO2: 94% 96%    Last Pain:  Vitals:   05/20/17 1704  TempSrc:   PainSc: [place]

## 2017-05-20 NOTE — Transfer of Care (Signed)
Immediate Anesthesia Transfer of Care Note  Patient: Katherine Lawrence  Procedure(s) Performed: LEFT TOTAL HIP ARTHROPLASTY ANTERIOR APPROACH (Left Hip)  Patient Location: PACU  Anesthesia Type:General  Level of Consciousness: sedated  Airway & Oxygen Therapy: Patient Spontanous Breathing and Patient connected to face mask oxygen  Post-op Assessment: Report given to RN and Post -op Vital signs reviewed and stable  Post vital signs: Reviewed and stable  Last Vitals:  Vitals:   05/20/17 0806  BP: (!) 145/73  Pulse: 73  Resp: 16  Temp: 37.4 C  SpO2: 99%    Last Pain:  Vitals:   05/20/17 0806  PainSc: 4       Patients Stated Pain Goal: 4 (34/03/52 4818)  Complications: No apparent anesthesia complications

## 2017-05-20 NOTE — Anesthesia Procedure Notes (Signed)
Procedure Name: Intubation Date/Time: 05/20/2017 9:32 AM Performed by: Lind Covert, CRNA Pre-anesthesia Checklist: Patient identified, Emergency Drugs available, Suction available, Patient being monitored and Timeout performed Patient Re-evaluated:Patient Re-evaluated prior to induction Oxygen Delivery Method: Circle system utilized Preoxygenation: Pre-oxygenation with 100% oxygen Induction Type: IV induction Laryngoscope Size: Mac and 4 Grade View: Grade I Tube type: Oral Tube size: 7.5 mm Number of attempts: 1 Airway Equipment and Method: Stylet Placement Confirmation: ETT inserted through vocal cords under direct vision,  positive ETCO2 and breath sounds checked- equal and bilateral Secured at: 22 cm Tube secured with: Tape Dental Injury: Teeth and Oropharynx as per pre-operative assessment

## 2017-05-20 NOTE — Op Note (Signed)
OPERATIVE REPORT- TOTAL HIP ARTHROPLASTY   PREOPERATIVE DIAGNOSIS: Osteoarthritis of the Left hip.   POSTOPERATIVE DIAGNOSIS: Osteoarthritis of the Left  hip.   PROCEDURE: Left total hip arthroplasty, anterior approach.   SURGEON: Gaynelle Arabian, MD   ASSISTANT: Arlee Muslim PA-C  ANESTHESIA:  General  ESTIMATED BLOOD LOSS:-300 mL    DRAINS: Hemovac x1.   COMPLICATIONS: None   CONDITION: PACU - hemodynamically stable.   BRIEF CLINICAL NOTE: Katherine Lawrence is a 61 y.o. female who has advanced end-  stage arthritis of their Left  hip with progressively worsening pain and  dysfunction.The patient has failed nonoperative management and presents for  total hip arthroplasty.   PROCEDURE IN DETAIL: After successful administration of spinal  anesthetic, the traction boots for the [facility] bed were placed on both  feet and the patient was placed onto the [facility] bed, boots placed into the leg  holders. The Left hip was then isolated from the perineum with plastic  drapes and prepped and draped in the usual sterile fashion. ASIS and  greater trochanter were marked and a oblique incision was made, starting  at about 1 cm lateral and 2 cm distal to the ASIS and coursing towards  the anterior cortex of the femur. The skin was cut with a 10 blade  through subcutaneous tissue to the level of the fascia overlying the  tensor fascia lata muscle. The fascia was then incised in line with the  incision at the junction of the anterior third and posterior 2/3rd. The  muscle was teased off the fascia and then the interval between the TFL  and the rectus was developed. The Hohmann retractor was then placed at  the top of the femoral neck over the capsule. The vessels overlying the  capsule were cauterized and the fat on top of the capsule was removed.  A Hohmann retractor was then placed anterior underneath the rectus  femoris to give exposure to the entire anterior capsule. A T-shaped   capsulotomy was performed. The edges were tagged and the femoral head  was identified.       Osteophytes are removed off the superior acetabulum.  The femoral neck was then cut in situ with an oscillating saw. Traction  was then applied to the left lower extremity utilizing the [facility]  traction. The femoral head was then removed. Retractors were placed  around the acetabulum and then circumferential removal of the labrum was  performed. Osteophytes were also removed. Reaming starts at 45 mm to  medialize and  Increased in 2 mm increments to 47 mm. We reamed in  approximately 40 degrees of abduction, 20 degrees anteversion. A 48 mm  pinnacle acetabular shell was then impacted in anatomic position under  fluoroscopic guidance with excellent purchase. We did not need to place  any additional dome screws. A 28 mm neutral + 4 marathon liner was then  placed into the acetabular shell.       The femoral lift was then placed along the lateral aspect of the femur  just distal to the vastus ridge. The leg was  externally rotated and capsule  was stripped off the inferior aspect of the femoral neck down to the  level of the lesser trochanter, this was done with electrocautery. The femur was lifted after this was performed. The  leg was then placed in an extended and adducted position essentially delivering the femur. We also removed the capsule superiorly and the piriformis from the piriformis  fossa to gain excellent exposure of the  proximal femur. Rongeur was used to remove some cancellous bone to get  into the lateral portion of the proximal femur for placement of the  initial starter reamer. The starter broaches was placed  the starter broach  and was shown to go down the center of the canal. Broaching  with the  Corail system was then performed starting at size 8, coursing  Up to size 9. A size 9 had excellent torsional and rotational  and axial stability. The trial standard offset neck was then  placed  with a 28 + 1.5 trial head. The hip was then reduced. We confirmed that  the stem was in the canal both on AP and lateral x-rays. It also has excellent sizing. The hip was reduced with outstanding stability through full extension and full external rotation.. AP pelvis was taken and the leg lengths were measured and found to be equal. Hip was then dislocated again and the femoral head and neck removed. The  femoral broach was removed. Size 9 Corail stem with a standard offset  neck was then impacted into the femur following native anteversion. Has  excellent purchase in the canal. Excellent torsional and rotational and  axial stability. It is confirmed to be in the canal on AP and lateral  fluoroscopic views. The 28 + 1.5 ceramic head was placed and the hip  reduced with outstanding stability. Again AP pelvis was taken and it  confirmed that the leg lengths were equal. The wound was then copiously  irrigated with saline solution and the capsule reattached and repaired  with Ethibond suture. 30 ml of .25% Bupivicaine was  injected into the capsule and into the edge of the tensor fascia lata as well as subcutaneous tissue. The fascia overlying the tensor fascia lata was then closed with a running #1 V-Loc. Subcu was closed with interrupted 2-0 Vicryl and subcuticular running 4-0 Monocryl. Incision was cleaned  and dried. Steri-Strips and a bulky sterile dressing applied. Hemovac  drain was hooked to suction and then the patient was awakened and transported to  recovery in stable condition.        Please note that a surgical assistant was a medical necessity for this procedure to perform it in a safe and expeditious manner. Assistant was necessary to provide appropriate retraction of vital neurovascular structures and to prevent femoral fracture and allow for anatomic placement of the prosthesis.  Gaynelle Arabian, M.D.

## 2017-05-21 LAB — CBC
HEMATOCRIT: 38.6 % (ref 36.0–46.0)
Hemoglobin: 12.4 g/dL (ref 12.0–15.0)
MCH: 29.2 pg (ref 26.0–34.0)
MCHC: 32.1 g/dL (ref 30.0–36.0)
MCV: 90.8 fL (ref 78.0–100.0)
PLATELETS: 202 10*3/uL (ref 150–400)
RBC: 4.25 MIL/uL (ref 3.87–5.11)
RDW: 13.6 % (ref 11.5–15.5)
WBC: 11.7 10*3/uL — ABNORMAL HIGH (ref 4.0–10.5)

## 2017-05-21 LAB — BASIC METABOLIC PANEL
Anion gap: 9 (ref 5–15)
BUN: 11 mg/dL (ref 6–20)
CHLORIDE: 106 mmol/L (ref 101–111)
CO2: 24 mmol/L (ref 22–32)
Calcium: 8.3 mg/dL — ABNORMAL LOW (ref 8.9–10.3)
Creatinine, Ser: 0.47 mg/dL (ref 0.44–1.00)
GFR calc Af Amer: >60 mL/min (ref >60)
GLUCOSE: 118 mg/dL — AB (ref 65–99)
POTASSIUM: 4.1 mmol/L (ref 3.5–5.1)
Sodium: 139 mmol/L (ref 135–145)

## 2017-05-21 LAB — GLUCOSE, CAPILLARY
Glucose-Capillary: 111 mg/dL — ABNORMAL HIGH (ref 65–99)
Glucose-Capillary: 121 mg/dL — ABNORMAL HIGH (ref 65–99)

## 2017-05-21 MED ORDER — METHOCARBAMOL 500 MG PO TABS: 500.0000 mg | ORAL_TABLET | Freq: Four times a day (QID) | ORAL | 0 refills | Status: DC | PRN

## 2017-05-21 MED ORDER — OXYCODONE HCL 5 MG PO TABS: 5.0000 mg | ORAL_TABLET | ORAL | 0 refills | Status: DC | PRN

## 2017-05-21 MED ORDER — RIVAROXABAN 10 MG PO TABS: 10.0000 mg | ORAL_TABLET | Freq: Every day | ORAL | 0 refills | Status: DC

## 2017-05-21 NOTE — Plan of Care (Signed)
Plan of care discussed with patient and husband.  Verbalizes u/o same.  Questions answered.

## 2017-05-21 NOTE — Progress Notes (Signed)
   Subjective: 1 Day Post-Op Procedure(s) (LRB): LEFT TOTAL HIP ARTHROPLASTY ANTERIOR APPROACH (Left) Patient reports pain as mild.   Patient seen in rounds by Dr. Wynelle Link. Patient is well, and has had no acute complaints or problems We will start therapy today.  If they do well with therapy and meets all goals, then will allow home later this afternoon following therapy. Plan is to go Home after hospital stay.  Objective: Vital signs in last 24 hours: Temp:  [98.2 F (36.8 C)-99.4 F (37.4 C)] 98.3 F (36.8 C) (12/06 0543) Pulse Rate:  [62-79] 68 (12/06 0543) Resp:  [12-22] 17 (12/06 0543) BP: (102-161)/(52-96) 102/52 (12/06 0543) SpO2:  [93 %-99 %] 97 % (12/06 0543) Weight:  [104.8 kg (231 lb)] 104.8 kg (231 lb) (12/05 0806)  Intake/Output from previous day:  Intake/Output Summary (Last 24 hours) at 05/21/2017 0739 Last data filed at 05/21/2017 0543 Gross per 24 hour  Intake 2868.75 ml  Output 1980 ml  Net 888.75 ml    Intake/Output this shift: No intake/output data recorded.  Labs: Recent Labs    05/21/17 0524  HGB 12.4   Recent Labs    05/21/17 0524  WBC 11.7*  RBC 4.25  HCT 38.6  PLT 202   Recent Labs    05/21/17 0524  NA 139  K 4.1  CL 106  CO2 24  BUN 11  CREATININE 0.47  GLUCOSE 118*  CALCIUM 8.3*   No results for input(s): LABPT, INR in the last 72 hours.  EXAM General - Patient is Alert, Appropriate and Oriented Extremity - Neurovascular intact Sensation intact distally Intact pulses distally Dressing - dressing C/D/I Motor Function - intact, moving foot and toes well on exam.  Hemovac pulled without difficulty.  Past Medical History:  Diagnosis Date  . CIN I (cervical intraepithelial neoplasia I) 1996   cryo...  cone of cervix  . DEPRESSION   . Diabetes type 2, controlled ([place])   . Endometrial polyp 07/21/2011  . Heart murmur    age 74   . HYPERCHOLESTEROLEMIA   . HYPERTENSION   . Osteoarthritis    hips and knees  . Pneumonia  06/2003  . Postsurgical menopause     Assessment/Plan: 1 Day Post-Op Procedure(s) (LRB): LEFT TOTAL HIP ARTHROPLASTY ANTERIOR APPROACH (Left) Principal Problem:   Osteoarthritis of left hip Active Problems:   OA (osteoarthritis) of hip  Estimated body mass index is 39.65 kg/m as calculated from the following:   Height as of this encounter: 5\' 4"  (1.626 m).   Weight as of this encounter: 104.8 kg (231 lb). Up with therapy Discharge home with home health  DVT Prophylaxis - Xarelto Weight Bearing As Tolerated left Leg Hemovac Pulled Begin Therapy  If meets goals and able to go home: Discharge home with home health Diet - Cardiac diet and Diabetic diet Follow up - in 2 weeks Activity - WBAT Disposition - Home Condition Upon Discharge - Stable D/C Meds - See DC Summary DVT Prophylaxis - Xarelto  Arlee Muslim, PA-C Orthopaedic Surgery 05/21/2017, 7:39 AM

## 2017-05-21 NOTE — Progress Notes (Signed)
Discharge planning, no HH needs identified. Plan for HEP, has DME. [phone]

## 2017-05-21 NOTE — Progress Notes (Signed)
Physical Therapy Treatment Patient Details Name: Katherine Lawrence MRN: [id number] DOB: 1956-04-25 Today's Date: 05/21/2017    History of Present Illness Pt s/p L THR and with hx of L TKR and R THR (posterior)    PT Comments    Pt motivated and progressing well with mobility.  Reviewed car transfers, stairs and home therex program with progression and written instruction.   Follow Up Recommendations  DC plan and follow up therapy as arranged by surgeon     Equipment Recommendations  None recommended by PT    Recommendations for Other Services       Precautions / Restrictions Precautions Precautions: Fall Restrictions Weight Bearing Restrictions: No Other Position/Activity Restrictions: WBAT    Mobility  Bed Mobility Overal bed mobility: Needs Assistance Bed Mobility: Supine to Sit;Sit to Supine     Supine to sit: Min guard;Supervision Sit to supine: Min assist;Min guard   General bed mobility comments: cues for sequence and use of R LE to self assist.  Transfers Overall transfer level: Needs assistance Equipment used: Rolling walker (2 wheeled) Transfers: Sit to/from Stand Sit to Stand: Min guard;Supervision         General transfer comment: cues for LE management and use of UEs to self assist  Ambulation/Gait Ambulation/Gait assistance: Min guard;Supervision Ambulation Distance (Feet): 140 Feet Assistive device: Rolling walker (2 wheeled) Gait Pattern/deviations: Step-to pattern;Decreased step length - right;Decreased step length - left;Shuffle Gait velocity: decr Gait velocity interpretation: Below normal speed for age/gender General Gait Details: cues for posture, position from RW and initial sequence   Stairs Stairs: Yes   Stair Management: One rail Right;Step to pattern;Forwards;With cane Number of Stairs: 7 General stair comments: cues for sequence and foot/cane placement  Wheelchair Mobility    Modified Rankin (Stroke Patients Only)        Balance                                            Cognition Arousal/Alertness: Awake/alert Behavior During Therapy: WFL for tasks assessed/performed Overall Cognitive Status: Within Functional Limits for tasks assessed                                        Exercises Total Joint Exercises Ankle Circles/Pumps: AROM;Both;20 reps;Supine Quad Sets: AROM;Both;10 reps;Supine Heel Slides: AAROM;Left;20 reps;Supine Hip ABduction/ADduction: AAROM;Left;15 reps;Supine Long Arc Quad: Left;10 reps;Seated;AROM    General Comments        Pertinent Vitals/Pain Pain Assessment: 0-10 Pain Score: 4  Pain Location: L hip Pain Descriptors / Indicators: Aching;Sore Pain Intervention(s): Limited activity within patient's tolerance;Premedicated before session;Monitored during session;Ice applied    Home Living Family/patient expects to be discharged to:: Private residence Living Arrangements: Children Available Help at Discharge: Family Type of Home: House Home Access: Stairs to enter Entrance Stairs-Rails: Right;Left Home Layout: One level Home Equipment: Environmental consultant - 2 wheels;Cane - single point;Bedside commode      Prior Function Level of Independence: Independent          PT Goals (current goals can now be found in the care plan section) Acute Rehab PT Goals Patient Stated Goal: Regain IND post op to allow L knee surgery in future PT Goal Formulation: With patient Time For Goal Achievement: 05/23/17 Potential to Achieve Goals: Good Progress towards PT goals:  Progressing toward goals    Frequency    7X/week      PT Plan Current plan remains appropriate    Co-evaluation              AM-PAC PT "6 Clicks" Daily Activity  Outcome Measure  Difficulty turning over in bed (including adjusting bedclothes, sheets and blankets)?: Unable Difficulty moving from lying on back to sitting on the side of the bed? : A Lot Difficulty sitting down  on and standing up from a chair with arms (e.g., wheelchair, bedside commode, etc,.)?: A Lot Help needed moving to and from a bed to chair (including a wheelchair)?: A Little Help needed walking in hospital room?: A Little Help needed climbing 3-5 steps with a railing? : A Little 6 Click Score: 14    End of Session Equipment Utilized During Treatment: Gait belt Activity Tolerance: Patient tolerated treatment well Patient left: in bed;with call [place]/phone within reach Nurse Communication: Mobility status PT Visit Diagnosis: Difficulty in walking, not elsewhere classified (R26.2)     Time: 2023-3435 PT Time Calculation (min) (ACUTE ONLY): 44 min  Charges:  $Gait Training: 8-22 mins $Therapeutic Exercise: 8-22 mins $Therapeutic Activity: 8-22 mins                    G Codes:       Pg [phone]    Katherine Lawrence 05/21/2017, 2:46 PM

## 2017-05-21 NOTE — Evaluation (Signed)
Physical Therapy Evaluation Patient Details Name: Katherine Lawrence MRN: [id number] DOB: August 30, 1955 Today's Date: 05/21/2017   History of Present Illness  Pt s/p L THR and with hx of L TKR and R THR (posterior)  Clinical Impression  Pt s/p L THR and presents with decreased L LE strength/ROM and post op pain limiting functional mobility.  Pt shoujld progress to dc home with family assist.    Follow Up Recommendations DC plan and follow up therapy as arranged by surgeon    Equipment Recommendations  None recommended by PT    Recommendations for Other Services       Precautions / Restrictions Precautions Precautions: Fall Restrictions Weight Bearing Restrictions: No Other Position/Activity Restrictions: (WBAT)      Mobility  Bed Mobility Overal bed mobility: Needs Assistance Bed Mobility: Supine to Sit     Supine to sit: Min assist     General bed mobility comments: cues for sequence and use of R LE to self assist.  Transfers Overall transfer level: Needs assistance Equipment used: Rolling walker (2 wheeled) Transfers: Sit to/from Stand Sit to Stand: Min assist         General transfer comment: cues for LE management and use of UEs to self assist  Ambulation/Gait Ambulation/Gait assistance: Min assist;Min guard Ambulation Distance (Feet): 200 Feet Assistive device: Rolling walker (2 wheeled) Gait Pattern/deviations: Step-to pattern;Decreased step length - right;Decreased step length - left;Shuffle Gait velocity: decr Gait velocity interpretation: Below normal speed for age/gender General Gait Details: cues for posture, position from RW and initial sequence  Stairs            Wheelchair Mobility    Modified Rankin (Stroke Patients Only)       Balance                                             Pertinent Vitals/Pain Pain Assessment: 0-10 Pain Score: 4  Pain Location: L hip Pain Descriptors / Indicators: Aching;Sore Pain  Intervention(s): Limited activity within patient's tolerance;Monitored during session;Premedicated before session;Ice applied    Home Living Family/patient expects to be discharged to:: Private residence Living Arrangements: Children Available Help at Discharge: Family Type of Home: House Home Access: Stairs to enter Entrance Stairs-Rails: Psychiatric nurse of Steps: 2 Home Layout: One level Home Equipment: Environmental consultant - 2 wheels;Cane - single point;Bedside commode      Prior Function Level of Independence: Independent               Hand Dominance        Extremity/Trunk Assessment   Upper Extremity Assessment Upper Extremity Assessment: Overall WFL for tasks assessed    Lower Extremity Assessment Lower Extremity Assessment: LLE deficits/detail LLE Deficits / Details: Strength at hip 2/5 with AAROM at hip to 75 flex and 20 abd.  L knee ltd ROM -15 - 45       Communication   Communication: No difficulties  Cognition Arousal/Alertness: Awake/alert Behavior During Therapy: WFL for tasks assessed/performed Overall Cognitive Status: Within Functional Limits for tasks assessed                                        General Comments      Exercises Total Joint Exercises Ankle Circles/Pumps: AROM;Both;20 reps;Supine Quad Sets: AROM;Both;10 reps;Supine  Heel Slides: AAROM;Left;20 reps;Supine Hip ABduction/ADduction: AAROM;Left;15 reps;Supine   Assessment/Plan    PT Assessment Patient needs continued PT services  PT Problem List Decreased strength;Decreased range of motion;Decreased activity tolerance;Decreased mobility;Decreased knowledge of use of DME;Obesity;Pain       PT Treatment Interventions DME instruction;Gait training;Stair training;Functional mobility training;Therapeutic activities;Therapeutic exercise;Patient/family education    PT Goals (Current goals can be found in the Care Plan section)  Acute Rehab PT Goals Patient  Stated Goal: Regain IND post op to allow L knee surgery in future PT Goal Formulation: With patient Time For Goal Achievement: 05/23/17 Potential to Achieve Goals: Good    Frequency 7X/week   Barriers to discharge        Co-evaluation               AM-PAC PT "6 Clicks" Daily Activity  Outcome Measure Difficulty turning over in bed (including adjusting bedclothes, sheets and blankets)?: Unable Difficulty moving from lying on back to sitting on the side of the bed? : Unable Difficulty sitting down on and standing up from a chair with arms (e.g., wheelchair, bedside commode, etc,.)?: Unable Help needed moving to and from a bed to chair (including a wheelchair)?: A Little Help needed walking in hospital room?: A Little Help needed climbing 3-5 steps with a railing? : A Little 6 Click Score: 12    End of Session Equipment Utilized During Treatment: Gait belt Activity Tolerance: Patient tolerated treatment well Patient left: in chair;with call [place]/phone within reach;with chair alarm set Nurse Communication: Mobility status PT Visit Diagnosis: Difficulty in walking, not elsewhere classified (R26.2)    Time: 1275-1700 PT Time Calculation (min) (ACUTE ONLY): 34 min   Charges:   PT Evaluation $PT Eval Low Complexity: 1 Low PT Treatments $Therapeutic Exercise: 8-22 mins   PT G Codes:        Pg [phone]   Katherine Lawrence 05/21/2017, 12:55 PM

## 2017-05-24 NOTE — Discharge Summary (Signed)
Physician Discharge Summary   Patient ID: Katherine Lawrence MRN: [id number] DOB/AGE: Aug 02, 1955 61 y.o.  Admit date: 05/20/2017 Discharge date: 05/21/2017  Primary Diagnosis: Osteoarthritis of the Left hip.    Admission Diagnoses:  Past Medical History:  Diagnosis Date  . CIN I (cervical intraepithelial neoplasia I) 1996   cryo...  cone of cervix  . DEPRESSION   . Diabetes type 2, controlled ([place])   . Endometrial polyp 07/21/2011  . Heart murmur    age 76   . HYPERCHOLESTEROLEMIA   . HYPERTENSION   . Osteoarthritis    hips and knees  . Pneumonia 06/2003  . Postsurgical menopause    Discharge Diagnoses:   Principal Problem:   Osteoarthritis of left hip Active Problems:   OA (osteoarthritis) of hip  Estimated body mass index is 39.65 kg/m as calculated from the following:   Height as of this encounter: '5\' 4"'$  (1.626 m).   Weight as of this encounter: 104.8 kg (231 lb).  Procedure(s) (LRB): LEFT TOTAL HIP ARTHROPLASTY ANTERIOR APPROACH (Left)   Consults: None  HPI: Katherine Lawrence is a 61 y.o. female who has advanced end-  stage arthritis of their Left  hip with progressively worsening pain and  dysfunction.The patient has failed nonoperative management and presents for  total hip arthroplasty.     Laboratory Data: Admission on 05/20/2017, Discharged on 05/21/2017  Component Date Value Ref Range Status  . Glucose-Capillary 05/20/2017 131* 65 - 99 mg/dL Final  . Glucose-Capillary 05/20/2017 136* 65 - 99 mg/dL Final  . WBC 05/21/2017 11.7* 4.0 - 10.5 K/uL Final  . RBC 05/21/2017 4.25  3.87 - 5.11 MIL/uL Final  . Hemoglobin 05/21/2017 12.4  12.0 - 15.0 g/dL Final  . HCT 05/21/2017 38.6  36.0 - 46.0 % Final  . MCV 05/21/2017 90.8  78.0 - 100.0 fL Final  . MCH 05/21/2017 29.2  26.0 - 34.0 pg Final  . MCHC 05/21/2017 32.1  30.0 - 36.0 g/dL Final  . RDW 05/21/2017 13.6  11.5 - 15.5 % Final  . Platelets 05/21/2017 202  150 - 400 K/uL Final  . Sodium 05/21/2017 139  135 - 145  mmol/L Final  . Potassium 05/21/2017 4.1  3.5 - 5.1 mmol/L Final  . Chloride 05/21/2017 106  101 - 111 mmol/L Final  . CO2 05/21/2017 24  22 - 32 mmol/L Final  . Glucose, Bld 05/21/2017 118* 65 - 99 mg/dL Final  . BUN 05/21/2017 11  6 - 20 mg/dL Final  . Creatinine, Ser 05/21/2017 0.47  0.44 - 1.00 mg/dL Final  . Calcium 05/21/2017 8.3* 8.9 - 10.3 mg/dL Final  . GFR calc non Af Amer 05/21/2017 >60  >60 mL/min Final  . GFR calc Af Amer 05/21/2017 >60  >60 mL/min Final   Comment: (NOTE) The eGFR has been calculated using the CKD EPI equation. This calculation has not been validated in all clinical situations. eGFR's persistently <60 mL/min signify possible Chronic Kidney Disease.   . Anion gap 05/21/2017 9  5 - 15 Final  . Glucose-Capillary 05/20/2017 176* 65 - 99 mg/dL Final  . Glucose-Capillary 05/20/2017 133* 65 - 99 mg/dL Final  . Glucose-Capillary 05/21/2017 111* 65 - 99 mg/dL Final  . Glucose-Capillary 05/21/2017 121* 65 - 99 mg/dL Final  Hospital Outpatient Visit on 05/15/2017  Component Date Value Ref Range Status  . Glucose-Capillary 05/15/2017 106* 65 - 99 mg/dL Final  . aPTT 05/15/2017 24  24 - 36 seconds Final  . Prothrombin Time 05/15/2017 13.3  11.4 - 15.2 seconds Final  . INR 05/15/2017 1.02   Final  . ABO/RH(D) 05/15/2017 B POS   Final  . Antibody Screen 05/15/2017 NEG   Final  . Sample Expiration 05/15/2017 05/23/2017   Final  . Extend sample reason 05/15/2017 NO TRANSFUSIONS OR PREGNANCY IN THE PAST 3 MONTHS   Final  . MRSA, PCR 05/15/2017 NEGATIVE  NEGATIVE Final  . Staphylococcus aureus 05/15/2017 NEGATIVE  NEGATIVE Final   Comment: (NOTE) The Xpert SA Assay (FDA approved for NASAL specimens in patients 61 years of age and older), is one component of a comprehensive surveillance program. It is not intended to diagnose infection nor to guide or monitor treatment.   . ABO/RH(D) 05/15/2017 B POS   Final  Appointment on 05/11/2017  Component Date Value Ref Range  Status  . Hgb A1c MFr Bld 05/11/2017 6.4  4.6 - 6.5 % Final   Glycemic Control Guidelines for People with Diabetes:Non Diabetic:  <6%Goal of Therapy: <7%Additional Action Suggested:  >8%   . Sodium 05/11/2017 141  135 - 145 mEq/L Final  . Potassium 05/11/2017 3.7  3.5 - 5.1 mEq/L Final  . Chloride 05/11/2017 101  96 - 112 mEq/L Final  . CO2 05/11/2017 30  19 - 32 mEq/L Final  . Glucose, Bld 05/11/2017 111* 70 - 99 mg/dL Final  . BUN 05/11/2017 19  6 - 23 mg/dL Final  . Creatinine, Ser 05/11/2017 0.70  0.40 - 1.20 mg/dL Final  . Total Bilirubin 05/11/2017 0.8  0.2 - 1.2 mg/dL Final  . Alkaline Phosphatase 05/11/2017 56  39 - 117 U/L Final  . AST 05/11/2017 14  0 - 37 U/L Final  . ALT 05/11/2017 12  0 - 35 U/L Final  . Total Protein 05/11/2017 7.3  6.0 - 8.3 g/dL Final  . Albumin 05/11/2017 4.7  3.5 - 5.2 g/dL Final  . Calcium 05/11/2017 9.9  8.4 - 10.5 mg/dL Final  . GFR 05/11/2017 90.95  >60.00 mL/min Final  . WBC 05/11/2017 9.0  4.0 - 10.5 K/uL Final  . RBC 05/11/2017 5.36* 3.87 - 5.11 Mil/uL Final  . Hemoglobin 05/11/2017 15.4* 12.0 - 15.0 g/dL Final  . HCT 05/11/2017 47.3* 36.0 - 46.0 % Final  . MCV 05/11/2017 88.3  78.0 - 100.0 fl Final  . MCHC 05/11/2017 32.6  30.0 - 36.0 g/dL Final  . RDW 05/11/2017 14.4  11.5 - 15.5 % Final  . Platelets 05/11/2017 285.0  150.0 - 400.0 K/uL Final  . Neutrophils Relative % 05/11/2017 63.3  43.0 - 77.0 % Final  . Lymphocytes Relative 05/11/2017 27.3  12.0 - 46.0 % Final  . Monocytes Relative 05/11/2017 5.3  3.0 - 12.0 % Final  . Eosinophils Relative 05/11/2017 3.0  0.0 - 5.0 % Final  . Basophils Relative 05/11/2017 1.1  0.0 - 3.0 % Final  . Neutro Abs 05/11/2017 5.7  1.4 - 7.7 K/uL Final  . Lymphs Abs 05/11/2017 2.5  0.7 - 4.0 K/uL Final  . Monocytes Absolute 05/11/2017 0.5  0.1 - 1.0 K/uL Final  . Eosinophils Absolute 05/11/2017 0.3  0.0 - 0.7 K/uL Final  . Basophils Absolute 05/11/2017 0.1  0.0 - 0.1 K/uL Final     X-Rays:Dg Pelvis  Portable  Result Date: 05/20/2017 CLINICAL DATA:  Status post left hip replacement today. EXAM: PORTABLE PELVIS 1-2 VIEWS COMPARISON:  Intraoperative imaging this same day. FINDINGS: New left total hip arthroplasty is in place. The device is located and no fracture is identified. Surgical drain  and gas in the soft tissues from surgery noted. Right hip arthroplasty is also identified. IMPRESSION: Status post left total hip replacement today.  No acute finding. Electronically Signed   By: Inge Rise M.D.   On: 05/20/2017 12:24   Dg C-arm 1-60 Min-no Report  Result Date: 05/20/2017 Fluoroscopy was utilized by the requesting physician.  No radiographic interpretation.    EKG: Orders placed or performed during the hospital encounter of 05/15/17  . EKG 12 lead  . EKG 12 lead     Hospital Course: Patient was admitted to [facility] and taken to the OR and underwent the above state procedure without complications.  Patient tolerated the procedure well and was later transferred to the recovery room and then to the orthopaedic floor for postoperative care.  They were given PO and IV analgesics for pain control following their surgery.  They were given 24 hours of postoperative antibiotics of  Anti-infectives (From admission, onward)   Start     Dose/Rate Route Frequency Ordered Stop   05/20/17 1600  ceFAZolin (ANCEF) IVPB 2g/100 mL premix     2 g 200 mL/hr over 30 Minutes Intravenous Every 6 hours 05/20/17 1501 05/20/17 2305   05/20/17 0755  ceFAZolin (ANCEF) 2-4 GM/100ML-% IVPB    Comments:  Harvell, Gwendolyn  : cabinet override      05/20/17 0755 05/20/17 0934   05/20/17 0750  ceFAZolin (ANCEF) IVPB 2g/100 mL premix     2 g 200 mL/hr over 30 Minutes Intravenous On call to O.R. 05/20/17 0750 05/20/17 0934     and started on DVT prophylaxis in the form of Xarelto.   PT and OT were ordered for total hip protocol.  The patient was allowed to be WBAT with therapy. Discharge planning  was consulted to help with postop disposition and equipment needs.  Patient had a decent night on the evening of surgery.  They started to get up OOB with therapy on day one.   Patient was seen in rounds on day one, doing well, met therapy goals and was ready to go home.  Discharge home with home health Diet - Cardiac diet and Diabetic diet Follow up - in 2 weeks Activity - WBAT Disposition - Home Condition Upon Discharge - Stable D/C Meds - See DC Summary DVT Prophylaxis - Xarelto     Discharge Instructions    Call MD / Call 911   Complete by:  As directed    If you experience chest pain or shortness of breath, CALL 911 and be transported to the hospital emergency room.  If you develope a fever above 101 F, pus (white drainage) or increased drainage or redness at the wound, or calf pain, call your surgeon's office.   Change dressing   Complete by:  As directed    You may change your dressing dressing daily with sterile 4 x 4 inch gauze dressing and paper tape.  Do not submerge the incision under water.   Constipation Prevention   Complete by:  As directed    Drink plenty of fluids.  Prune juice may be helpful.  You may use a stool softener, such as Colace (over the counter) 100 mg twice a day.  Use MiraLax (over the counter) for constipation as needed.   Diet - low sodium heart healthy   Complete by:  As directed    Diet Carb Modified   Complete by:  As directed    Discharge instructions   Complete by:  As directed    Take Xarelto for two and a half more weeks, then discontinue Xarelto. Once the patient has completed the Xarelto, they may resume the 81 mg Aspirin.   Pick up stool softner and laxative for home use following surgery while on pain medications. Do not submerge incision under water. Please use good hand washing techniques while changing dressing each day. May shower starting three days after surgery. Please use a clean towel to pat the incision dry following  showers. Continue to use ice for pain and swelling after surgery. Do not use any lotions or creams on the incision until instructed by your surgeon.  Wear both TED hose on both legs during the day every day for three weeks, but may remove the TED hose at night at home.  Postoperative Constipation Protocol  Constipation - defined medically as fewer than three stools per week and severe constipation as less than one stool per week.  One of the most common issues patients have following surgery is constipation.  Even if you have a regular bowel pattern at home, your normal regimen is likely to be disrupted due to multiple reasons following surgery.  Combination of anesthesia, postoperative narcotics, change in appetite and fluid intake all can affect your bowels.  In order to avoid complications following surgery, here are some recommendations in order to help you during your recovery period.  Colace (docusate) - Pick up an over-the-counter form of Colace or another stool softener and take twice a day as long as you are requiring postoperative pain medications.  Take with a full glass of water daily.  If you experience loose stools or diarrhea, hold the colace until you stool forms back up.  If your symptoms do not get better within 1 week or if they get worse, check with your doctor.  Dulcolax (bisacodyl) - Pick up over-the-counter and take as directed by the product packaging as needed to assist with the movement of your bowels.  Take with a full glass of water.  Use this product as needed if not relieved by Colace only.   MiraLax (polyethylene glycol) - Pick up over-the-counter to have on hand.  MiraLax is a solution that will increase the amount of water in your bowels to assist with bowel movements.  Take as directed and can mix with a glass of water, juice, soda, coffee, or tea.  Take if you go more than two days without a movement. Do not use MiraLax more than once per day. Call your doctor if  you are still constipated or irregular after using this medication for 7 days in a row.  If you continue to have problems with postoperative constipation, please contact the office for further assistance and recommendations.  If you experience "the worst abdominal pain ever" or develop nausea or vomiting, please contact the office immediatly for further recommendations for treatment.   Do not sit on low chairs, stoools or toilet seats, as it may be difficult to get up from low surfaces   Complete by:  As directed    Driving restrictions   Complete by:  As directed    No driving until released by the physician.   Increase activity slowly as tolerated   Complete by:  As directed    Lifting restrictions   Complete by:  As directed    No lifting until released by the physician.   Patient may shower   Complete by:  As directed    You may shower without  a dressing once there is no drainage.  Do not wash over the wound.  If drainage remains, do not shower until drainage stops.   TED hose   Complete by:  As directed    Use stockings (TED hose) for 3 weeks on both leg(s).  You may remove them at night for sleeping.   Weight bearing as tolerated   Complete by:  As directed    Laterality:  left   Extremity:  Lower     Allergies as of 05/21/2017   No Known Allergies     Medication List    STOP taking these medications   aspirin EC 81 MG tablet   ibuprofen 200 MG tablet Commonly known as:  ADVIL,MOTRIN     TAKE these medications   amLODipine 5 MG tablet Commonly known as:  NORVASC Take 1 tablet (5 mg total) by mouth daily.   atorvastatin 20 MG tablet Commonly known as:  LIPITOR TAKE 1 TABLET BY MOUTH  DAILY AT 6 PM. What changed:  See the new instructions.   blood glucose meter kit and supplies Kit Dispense based on patient and insurance preference. Use up to four times daily as directed. (FOR E11.9).   citalopram 40 MG tablet Commonly known as:  CELEXA TAKE 1 TABLET BY MOUTH  DAILY AS NEEDED What changed:    how much to take  how to take this  when to take this   latanoprost 0.005 % ophthalmic solution Commonly known as:  XALATAN INSTILL 1 DROP BOTH EYES AT BEDTIME   losartan 100 MG tablet Commonly known as:  COZAAR Take 1 tablet (100 mg total) by mouth daily.   metFORMIN 500 MG tablet Commonly known as:  GLUCOPHAGE Take 1 tablet (500 mg total) by mouth 2 (two) times daily with a meal.   methocarbamol 500 MG tablet Commonly known as:  ROBAXIN Take 1 tablet (500 mg total) by mouth every 6 (six) hours as needed for muscle spasms.   oxyCODONE 5 MG immediate release tablet Commonly known as:  Oxy IR/ROXICODONE Take 1 tablet (5 mg total) by mouth every 4 (four) hours as needed for moderate pain or severe pain.   rivaroxaban 10 MG Tabs tablet Commonly known as:  XARELTO Take 1 tablet (10 mg total) by mouth daily with breakfast. Take Xarelto for two and a half more weeks following discharge from the hospital, then discontinue Xarelto. Once the patient has completed the Xarelto, they may resume the 81 mg Aspirin.   SYSTANE BALANCE 0.6 % Soln Generic drug:  Propylene Glycol Place 1 drop into both eyes as needed (for dry eyes).            Discharge Care Instructions  (From admission, onward)        Start     Ordered   05/21/17 0000  Weight bearing as tolerated    Question Answer Comment  Laterality left   Extremity Lower      05/21/17 0744   05/21/17 0000  Change dressing    Comments:  You may change your dressing dressing daily with sterile 4 x 4 inch gauze dressing and paper tape.  Do not submerge the incision under water.   05/21/17 0744     Follow-up Information    Gaynelle Arabian, MD. Schedule an appointment as soon as possible for a visit on 06/02/2017.   Specialty:  Orthopedic Surgery Contact information: [address] [place] [address] [phone]  Signed: Arlee Muslim,  PA-C Orthopaedic Surgery 05/24/2017, 6:00 PM

## 2017-07-13 ENCOUNTER — Encounter: Payer: Self-pay | Admitting: Obstetrics & Gynecology

## 2017-07-13 ENCOUNTER — Ambulatory Visit (INDEPENDENT_AMBULATORY_CARE_PROVIDER_SITE_OTHER): Payer: 59 | Admitting: Obstetrics & Gynecology

## 2017-07-13 VITALS — BP 144/92 | Ht 62.5 in | Wt 238.0 lb

## 2017-07-13 DIAGNOSIS — Z01411 Encounter for gynecological examination (general) (routine) with abnormal findings: Secondary | ICD-10-CM | POA: Diagnosis not present

## 2017-07-13 DIAGNOSIS — Z6841 Body Mass Index (BMI) 40.0 and over, adult: Secondary | ICD-10-CM

## 2017-07-13 DIAGNOSIS — Z13820 Encounter for screening for osteoporosis: Secondary | ICD-10-CM | POA: Diagnosis not present

## 2017-07-13 DIAGNOSIS — Z1151 Encounter for screening for human papillomavirus (HPV): Secondary | ICD-10-CM

## 2017-07-13 DIAGNOSIS — Z780 Asymptomatic menopausal state: Secondary | ICD-10-CM | POA: Diagnosis not present

## 2017-07-13 DIAGNOSIS — N898 Other specified noninflammatory disorders of vagina: Secondary | ICD-10-CM | POA: Diagnosis not present

## 2017-07-13 DIAGNOSIS — N816 Rectocele: Secondary | ICD-10-CM

## 2017-07-13 LAB — WET PREP FOR TRICH, YEAST, CLUE

## 2017-07-13 NOTE — Patient Instructions (Signed)
1. Encounter for gynecological examination with abnormal finding Gynecologic exam status post TAH/BSO.  Pap reflex done on the vaginal vault.  Breast exam normal.  Screening mammogram negative September 2018.  Colonoscopy 2010, will schedule in 2020.  Health labs with family physician.  2. Menopause present Well on no hormone replacement therapy.  3. Screening for osteoporosis Vitamin D supplements, calcium rich nutrition and regular weightbearing physical activity recommended.  Vitamin D level here today.  Will schedule bone density here as soon as possible. - Vitamin D 1,25 dihydroxy - DG Bone Density; Future  4. Vaginal discharge Wet prep normal, patient reassured. - WET PREP FOR [place], YEAST, CLUE  5. Rectocele Grade 1-2 rectocele.  Patient reassured that it is not dangerous.  Recommend avoiding excessive pelvic pressure.  Will treat constipation if it happens in the future.  Avoid excessive lifting and treat coughing.  Will do Kegle exercises on a regular basis to improve stress urinary incontinence.  6. Class 3 severe obesity due to excess calories without serious comorbidity with body mass index (BMI) of 40.0 to 44.9 in adult [facility]) Recommend low calorie/low carb diet such as [employer].  Regular aerobic physical activity and weight lifting every 2 days recommended as much as recent hip replacement and knee problems allow.  [place], it was a pleasure meeting you today!  I will inform you of your results as soon as they are available.   Kegel Exercises Kegel exercises help strengthen the muscles that support the rectum, vagina, small intestine, bladder, and uterus. Doing Kegel exercises can help:  Improve bladder and bowel control.  Improve sexual response.  Reduce problems and discomfort during pregnancy.  Kegel exercises involve squeezing your pelvic floor muscles, which are the same muscles you squeeze when you try to stop the flow of urine. The exercises can be done  while sitting, standing, or lying down, but it is best to vary your position. Phase 1 exercises 1. Squeeze your pelvic floor muscles tight. You should feel a tight lift in your rectal area. If you are a female, you should also feel a tightness in your vaginal area. Keep your stomach, buttocks, and legs relaxed. 2. Hold the muscles tight for up to 10 seconds. 3. Relax your muscles. Repeat this exercise 50 times a day or as many times as told by your health care provider. Continue to do this exercise for at least 4-6 weeks or for as long as told by your health care provider. This information is not intended to replace advice given to you by your health care provider. Make sure you discuss any questions you have with your health care provider. Document Released: 05/19/2012 Document Revised: 01/26/2016 Document Reviewed: 04/22/2015 Elsevier Interactive Patient Education  2018 [place] Maintenance for Postmenopausal Women Menopause is a normal process in which your reproductive ability comes to an end. This process happens gradually over a span of months to years, usually between the ages of 48 and 45. Menopause is complete when you have missed 12 consecutive menstrual periods. It is important to talk with your health care provider about some of the most common conditions that affect postmenopausal women, such as heart disease, cancer, and bone loss (osteoporosis). Adopting a healthy lifestyle and getting preventive care can help to promote your health and wellness. Those actions can also lower your chances of developing some of these common conditions. What should I know about menopause? During menopause, you may experience a number of symptoms, such as:  Moderate-to-severe hot flashes.  Night sweats.  Decrease in sex drive.  Mood swings.  Headaches.  Tiredness.  Irritability.  Memory problems.  Insomnia.  Choosing to treat or not to treat menopausal changes is an  individual decision that you make with your health care provider. What should I know about hormone replacement therapy and supplements? Hormone therapy products are effective for treating symptoms that are associated with menopause, such as hot flashes and night sweats. Hormone replacement carries certain risks, especially as you become older. If you are thinking about using estrogen or estrogen with progestin treatments, discuss the benefits and risks with your health care provider. What should I know about heart disease and stroke? Heart disease, heart attack, and stroke become more likely as you age. This may be due, in part, to the hormonal changes that your body experiences during menopause. These can affect how your body processes dietary fats, triglycerides, and cholesterol. Heart attack and stroke are both medical emergencies. There are many things that you can do to help prevent heart disease and stroke:  Have your blood pressure checked at least every 1-2 years. High blood pressure causes heart disease and increases the risk of stroke.  If you are 80-37 years old, ask your health care provider if you should take aspirin to prevent a heart attack or a stroke.  Do not use any tobacco products, including cigarettes, chewing tobacco, or electronic cigarettes. If you need help quitting, ask your health care provider.  It is important to eat a healthy diet and maintain a healthy weight. ? Be sure to include plenty of vegetables, fruits, low-fat dairy products, and lean protein. ? Avoid eating foods that are high in solid fats, added sugars, or salt (sodium).  Get regular exercise. This is one of the most important things that you can do for your health. ? Try to exercise for at least 150 minutes each week. The type of exercise that you do should increase your heart rate and make you sweat. This is known as moderate-intensity exercise. ? Try to do strengthening exercises at least twice each  week. Do these in addition to the moderate-intensity exercise.  Know your numbers.Ask your health care provider to check your cholesterol and your blood glucose. Continue to have your blood tested as directed by your health care provider.  What should I know about cancer screening? There are several types of cancer. Take the following steps to reduce your risk and to catch any cancer development as early as possible. Breast Cancer  Practice breast self-awareness. ? This means understanding how your breasts normally appear and feel. ? It also means doing regular breast self-exams. Let your health care provider know about any changes, no matter how small.  If you are 51 or older, have a clinician do a breast exam (clinical breast exam or CBE) every year. Depending on your age, family history, and medical history, it may be recommended that you also have a yearly breast X-ray (mammogram).  If you have a family history of breast cancer, talk with your health care provider about genetic screening.  If you are at high risk for breast cancer, talk with your health care provider about having an MRI and a mammogram every year.  Breast cancer (BRCA) gene test is recommended for women who have family members with BRCA-related cancers. Results of the assessment will determine the need for genetic counseling and BRCA1 and for BRCA2 testing. BRCA-related cancers include these types: ? Breast. This occurs in  males or females. ? Ovarian. ? Tubal. This may also be called fallopian tube cancer. ? Cancer of the abdominal or pelvic lining (peritoneal cancer). ? Prostate. ? Pancreatic.  Cervical, Uterine, and Ovarian Cancer Your health care provider may recommend that you be screened regularly for cancer of the pelvic organs. These include your ovaries, uterus, and vagina. This screening involves a pelvic exam, which includes checking for microscopic changes to the surface of your cervix (Pap test).  For  women ages 21-65, health care providers may recommend a pelvic exam and a Pap test every three years. For women ages 52-65, they may recommend the Pap test and pelvic exam, combined with testing for human papilloma virus (HPV), every five years. Some types of HPV increase your risk of cervical cancer. Testing for HPV may also be done on women of any age who have unclear Pap test results.  Other health care providers may not recommend any screening for nonpregnant women who are considered low risk for pelvic cancer and have no symptoms. Ask your health care provider if a screening pelvic exam is right for you.  If you have had past treatment for cervical cancer or a condition that could lead to cancer, you need Pap tests and screening for cancer for at least 20 years after your treatment. If Pap tests have been discontinued for you, your risk factors (such as having a new sexual partner) need to be reassessed to determine if you should start having screenings again. Some women have medical problems that increase the chance of getting cervical cancer. In these cases, your health care provider may recommend that you have screening and Pap tests more often.  If you have a family history of uterine cancer or ovarian cancer, talk with your health care provider about genetic screening.  If you have vaginal bleeding after reaching menopause, tell your health care provider.  There are currently no reliable tests available to screen for ovarian cancer.  Lung Cancer Lung cancer screening is recommended for adults 36-30 years old who are at high risk for lung cancer because of a history of smoking. A yearly low-dose CT scan of the lungs is recommended if you:  Currently smoke.  Have a history of at least 30 pack-years of smoking and you currently smoke or have quit within the past 15 years. A pack-year is smoking an average of one pack of cigarettes per day for one year.  Yearly screening should:  Continue  until it has been 15 years since you quit.  Stop if you develop a health problem that would prevent you from having lung cancer treatment.  Colorectal Cancer  This type of cancer can be detected and can often be prevented.  Routine colorectal cancer screening usually begins at age 89 and continues through age 23.  If you have risk factors for colon cancer, your health care provider may recommend that you be screened at an earlier age.  If you have a family history of colorectal cancer, talk with your health care provider about genetic screening.  Your health care provider may also recommend using home test kits to check for hidden blood in your stool.  A small camera at the end of a tube can be used to examine your colon directly (sigmoidoscopy or colonoscopy). This is done to check for the earliest forms of colorectal cancer.  Direct examination of the colon should be repeated every 5-10 years until age 59. However, if early forms of precancerous polyps or small  growths are found or if you have a family history or genetic risk for colorectal cancer, you may need to be screened more often.  Skin Cancer  Check your skin from head to toe regularly.  Monitor any moles. Be sure to tell your health care provider: ? About any new moles or changes in moles, especially if there is a change in a mole's shape or color. ? If you have a mole that is larger than the size of a pencil eraser.  If any of your family members has a history of skin cancer, especially at a young age, talk with your health care provider about genetic screening.  Always use sunscreen. Apply sunscreen liberally and repeatedly throughout the day.  Whenever you are outside, protect yourself by wearing long sleeves, pants, a wide-brimmed hat, and sunglasses.  What should I know about osteoporosis? Osteoporosis is a condition in which bone destruction happens more quickly than new bone creation. After menopause, you may be  at an increased risk for osteoporosis. To help prevent osteoporosis or the bone fractures that can happen because of osteoporosis, the following is recommended:  If you are 9-55 years old, get at least 1,000 mg of calcium and at least 600 mg of vitamin D per day.  If you are older than age 66 but younger than age 10, get at least 1,200 mg of calcium and at least 600 mg of vitamin D per day.  If you are older than age 56, get at least 1,200 mg of calcium and at least 800 mg of vitamin D per day.  Smoking and excessive alcohol intake increase the risk of osteoporosis. Eat foods that are rich in calcium and vitamin D, and do weight-bearing exercises several times each week as directed by your health care provider. What should I know about how menopause affects my mental health? Depression may occur at any age, but it is more common as you become older. Common symptoms of depression include:  Low or sad mood.  Changes in sleep patterns.  Changes in appetite or eating patterns.  Feeling an overall lack of motivation or enjoyment of activities that you previously enjoyed.  Frequent crying spells.  Talk with your health care provider if you think that you are experiencing depression. What should I know about immunizations? It is important that you get and maintain your immunizations. These include:  Tetanus, diphtheria, and pertussis (Tdap) booster vaccine.  Influenza every year before the flu season begins.  Pneumonia vaccine.  Shingles vaccine.  Your health care provider may also recommend other immunizations. This information is not intended to replace advice given to you by your health care provider. Make sure you discuss any questions you have with your health care provider. Document Released: 07/25/2005 Document Revised: 12/21/2015 Document Reviewed: 03/06/2015 Elsevier Interactive Patient Education  2018 [employer].

## 2017-07-13 NOTE — Progress Notes (Signed)
Katherine Lawrence 14-Dec-1955 [id number]   History:    62 y.o. G2P2L2  Married.  2 sons doing well.  RP:  New patient presenting for annual gyn exam   HPI:  S/P TAH/BSO 09/2011.  No hormone replacement therapy.  No pelvic pain.  Abstinent.  Feels that her vaginal discharge is increased with occasional odor.  No vaginal itching.  Moderate stress urinary incontinence with sneezing, coughing and walking.  Feels a bulge in the vagina especially when pushing for a bowel movement.  Sometimes needs to use her fingers to push the stools vaginal.  Breasts normal.  Body mass index 42.84.  Past medical history,surgical history, family history and social history were all reviewed and documented in the EPIC chart.  Gynecologic History Patient's last menstrual period was 07/07/2011. Contraception: status post hysterectomy Last Pap: 2014. Results were: negative. Last mammogram: 02/2017. Results were: Negative Colonoscopy 2010 Bone density normal 2014  Obstetric History OB History  Gravida Para Term Preterm AB Living  2 2       2   SAB TAB Ectopic Multiple Live Births               # Outcome Date GA Lbr Len/2nd Weight Sex Delivery Anes PTL Lv  2 Para           1 Para                ROS: A ROS was performed and pertinent positives and negatives are included in the history.  GENERAL: No fevers or chills. HEENT: No change in vision, no earache, sore throat or sinus congestion. NECK: No pain or stiffness. CARDIOVASCULAR: No chest pain or pressure. No palpitations. PULMONARY: No shortness of breath, cough or wheeze. GASTROINTESTINAL: No abdominal pain, nausea, vomiting or diarrhea, melena or bright red blood per rectum. GENITOURINARY: No urinary frequency, urgency, hesitancy or dysuria. MUSCULOSKELETAL: No joint or muscle pain, no back pain, no recent trauma. DERMATOLOGIC: No rash, no itching, no lesions. ENDOCRINE: No polyuria, polydipsia, no heat or cold intolerance. No recent change in weight.  HEMATOLOGICAL: No anemia or easy bruising or bleeding. NEUROLOGIC: No headache, seizures, numbness, tingling or weakness. PSYCHIATRIC: No depression, no loss of interest in normal activity or change in sleep pattern.     Exam:   BP (!) 144/92   Ht 5' 2.5" (1.588 m)   Wt 238 lb (108 kg)   LMP 07/07/2011   BMI 42.84 kg/m   Body mass index is 42.84 kg/m.  General appearance : Well developed well nourished female. No acute distress HEENT: Eyes: no retinal hemorrhage or exudates,  Neck supple, trachea midline, no carotid bruits, no thyroidmegaly Lungs: Clear to auscultation, no rhonchi or wheezes, or rib retractions  Heart: Regular rate and rhythm, no murmurs or gallops Breast:Examined in sitting and supine position were symmetrical in appearance, no palpable masses or tenderness,  no skin retraction, no nipple inversion, no nipple discharge, no skin discoloration, no axillary or supraclavicular lymphadenopathy Abdomen: no palpable masses or tenderness, no rebound or guarding Extremities: no edema or skin discoloration or tenderness  Pelvic: Vulva normal  Bartholin, Urethra, Skene Glands: Within normal limits             Vagina: No gross lesions or discharge.  Grade 1-2/3 Rectocele. Pap reflex done.  Wet prep done.  Cervix/Uterus absent  Adnexa  Without masses or tenderness  Anus and perineum  normal    Assessment/Plan:  62 y.o. female for annual exam   1.  Encounter for gynecological examination with abnormal finding Gynecologic exam status post TAH/BSO.  Pap reflex done on the vaginal vault.  Breast exam normal.  Screening mammogram negative September 2018.  Colonoscopy 2010, will schedule in 2020.  Health labs with family physician.  2. Menopause present Well on no hormone replacement therapy.  3. Screening for osteoporosis Vitamin D supplements, calcium rich nutrition and regular weightbearing physical activity recommended.  Vitamin D level here today.  Will schedule bone  density here as soon as possible. - Vitamin D 1,25 dihydroxy - DG Bone Density; Future  4. Vaginal discharge Wet prep normal, patient reassured. - WET PREP FOR [place], YEAST, CLUE  5. Rectocele Grade 1-2 rectocele.  Patient reassured that it is not dangerous.  Recommend avoiding excessive pelvic pressure.  Will treat constipation if it happens in the future.  Avoid excessive lifting and treat coughing.  Will do Kegle exercises on a regular basis to improve stress urinary incontinence.  6. Class 3 severe obesity due to excess calories without serious comorbidity with body mass index (BMI) of 40.0 to 44.9 in adult [facility]) Recommend low calorie/low carb diet such as [employer].  Regular aerobic physical activity and weight lifting every 2 days recommended as much as recent hip replacement and knee problems allow.  Counseling on above issues more than 50% for 15 minutes.  Princess Bruins MD, 11:27 AM 07/13/2017

## 2017-07-14 ENCOUNTER — Other Ambulatory Visit: Payer: Self-pay | Admitting: Internal Medicine

## 2017-07-14 ENCOUNTER — Other Ambulatory Visit: Payer: Self-pay | Admitting: Gynecology

## 2017-07-14 DIAGNOSIS — Z13820 Encounter for screening for osteoporosis: Secondary | ICD-10-CM

## 2017-07-14 DIAGNOSIS — H409 Unspecified glaucoma: Secondary | ICD-10-CM | POA: Insufficient documentation

## 2017-07-16 LAB — PAP, TP IMAGING W/ HPV RNA, RFLX HPV TYPE 16,18/45: HPV DNA HIGH RISK: NOT DETECTED

## 2017-07-17 LAB — VITAMIN D 1,25 DIHYDROXY
VITAMIN D3 1, 25 (OH): 51 pg/mL
Vitamin D 1, 25 (OH)2 Total: 51 pg/mL (ref 18–72)

## 2017-07-23 ENCOUNTER — Ambulatory Visit (INDEPENDENT_AMBULATORY_CARE_PROVIDER_SITE_OTHER): Payer: 59

## 2017-07-23 DIAGNOSIS — Z13820 Encounter for screening for osteoporosis: Secondary | ICD-10-CM | POA: Diagnosis not present

## 2017-07-24 ENCOUNTER — Encounter: Payer: Self-pay | Admitting: Gynecology

## 2017-08-11 DIAGNOSIS — Z96642 Presence of left artificial hip joint: Secondary | ICD-10-CM | POA: Insufficient documentation

## 2017-08-14 ENCOUNTER — Other Ambulatory Visit: Payer: Self-pay | Admitting: Internal Medicine

## 2017-09-12 ENCOUNTER — Other Ambulatory Visit: Payer: Self-pay | Admitting: Internal Medicine

## 2017-09-22 ENCOUNTER — Other Ambulatory Visit: Payer: Self-pay | Admitting: Internal Medicine

## 2017-10-11 ENCOUNTER — Emergency Department (HOSPITAL_COMMUNITY): Payer: 59

## 2017-10-11 ENCOUNTER — Encounter (HOSPITAL_COMMUNITY): Payer: Self-pay | Admitting: Emergency Medicine

## 2017-10-11 ENCOUNTER — Emergency Department (HOSPITAL_COMMUNITY)
Admission: EM | Admit: 2017-10-11 | Discharge: 2017-10-11 | Disposition: A | Discharge status: Home / Self Care | Payer: 59 | Attending: Emergency Medicine | Admitting: Emergency Medicine

## 2017-10-11 DIAGNOSIS — Z96643 Presence of artificial hip joint, bilateral: Secondary | ICD-10-CM | POA: Diagnosis not present

## 2017-10-11 DIAGNOSIS — W19XXXA Unspecified fall, initial encounter: Secondary | ICD-10-CM

## 2017-10-11 DIAGNOSIS — Z79899 Other long term (current) drug therapy: Secondary | ICD-10-CM | POA: Diagnosis not present

## 2017-10-11 DIAGNOSIS — Z87891 Personal history of nicotine dependence: Secondary | ICD-10-CM | POA: Insufficient documentation

## 2017-10-11 DIAGNOSIS — Y999 Unspecified external cause status: Secondary | ICD-10-CM | POA: Diagnosis not present

## 2017-10-11 DIAGNOSIS — Z7984 Long term (current) use of oral hypoglycemic drugs: Secondary | ICD-10-CM | POA: Diagnosis not present

## 2017-10-11 DIAGNOSIS — Y93E1 Activity, personal bathing and showering: Secondary | ICD-10-CM | POA: Diagnosis not present

## 2017-10-11 DIAGNOSIS — Z7901 Long term (current) use of anticoagulants: Secondary | ICD-10-CM | POA: Insufficient documentation

## 2017-10-11 DIAGNOSIS — I10 Essential (primary) hypertension: Secondary | ICD-10-CM | POA: Diagnosis not present

## 2017-10-11 DIAGNOSIS — E119 Type 2 diabetes mellitus without complications: Secondary | ICD-10-CM | POA: Diagnosis not present

## 2017-10-11 DIAGNOSIS — M25552 Pain in left hip: Secondary | ICD-10-CM | POA: Insufficient documentation

## 2017-10-11 DIAGNOSIS — W182XXA Fall in (into) shower or empty bathtub, initial encounter: Secondary | ICD-10-CM | POA: Insufficient documentation

## 2017-10-11 DIAGNOSIS — Y92002 Bathroom of unspecified non-institutional (private) residence single-family (private) house as the place of occurrence of the external cause: Secondary | ICD-10-CM | POA: Insufficient documentation

## 2017-10-11 DIAGNOSIS — Z8543 Personal history of malignant neoplasm of ovary: Secondary | ICD-10-CM | POA: Insufficient documentation

## 2017-10-11 MED ORDER — OXYCODONE-ACETAMINOPHEN 5-325 MG PO TABS
1.0000 | ORAL_TABLET | Freq: Once | ORAL | Status: AC
  Administered 2017-10-11: 1 via ORAL
  Filled 2017-10-11: qty 1

## 2017-10-11 NOTE — ED Notes (Signed)
Patient transported to CT 

## 2017-10-11 NOTE — ED Triage Notes (Signed)
Pt reports that she fell in shower yesterday and had to call EMS to help get her up. Reports hx hip replacement and bursitis in left hip but pain is much worse with movement and ambulation, having to use walker for gait assistance.

## 2017-10-11 NOTE — Discharge Instructions (Addendum)
It was my pleasure taking care of you today!   Fortunately, your imaging was very reassuring today with no signs of fracture.  Keep your appointment with the orthopedist.  If your symptoms are not improving in the next couple of days, call their office to see if you can get in sooner.   Return to ER for new or worsening symptoms, any additional concerns.

## 2017-10-11 NOTE — ED Provider Notes (Signed)
[facility] DEPT Provider Note   CSN: [id number] Arrival date & time: 10/11/17  1301     History   Chief Complaint Chief Complaint  Patient presents with  . Hip Pain  . Fall    HPI Katherine Lawrence is a 62 y.o. female.  The history is provided by the patient and medical records. No language interpreter was used.  Hip Pain   Fall     Katherine Lawrence is a 62 y.o. female  with a PMH of prior left hip replacement in December who presents to the Emergency Department for evaluation after mechanical fall yesterday.  Patient fell onto left shoulder and left hip.  Patient states that she slipped and fell while she was in the shower yesterday.  She was unable to get up and laid in the shower until her husband came home a couple of hours later.  EMS was called who helped her up.  They recommended that she come to the emergency department at that time, but she feels as if she was just sore and did not want to come in.  This morning, she awoke and her pain was much more severe, prompting her to come to the ER for evaluation.  She feels that she is doing well since her hip replacement until the fall.  She has been ambulatory walking without cane or walker.  She does have a follow-up appointment with orthopedist on May 16.   Past Medical History:  Diagnosis Date  . CIN I (cervical intraepithelial neoplasia I) 1996   cryo...  cone of cervix  . DEPRESSION   . Diabetes type 2, controlled ([place])   . Endometrial polyp 07/21/2011  . Heart murmur    age 68   . HYPERCHOLESTEROLEMIA   . HYPERTENSION   . Osteoarthritis    hips and knees  . Pneumonia 06/2003  . Postsurgical menopause     Patient Active Problem List   Diagnosis Date Noted  . OA (osteoarthritis) of hip 05/20/2017  . Preoperative examination 05/11/2017  . Osteoarthritis of left hip 05/10/2017  . Memory loss 04/13/2015  . Type 2 diabetes mellitus without complication, without long-term current use of  insulin ([place]) 04/13/2015  . Obese 03/21/2015  . 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 Osteoarthritis of left knee   . Concentration deficit   . Postsurgical menopause 07/08/2012  . 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 Ovarian cyst 07/21/2011  . Mild depression ([place]) 10/19/2008  . HYPERCHOLESTEROLEMIA 04/03/2008  . Essential hypertension 04/03/2008  . Myalgia and myositis 04/03/2008    Past Surgical History:  Procedure Laterality Date  . ABDOMINAL HYSTERECTOMY  08/22/2011  . CERVICAL CONE BIOPSY  1996  . CESAREAN SECTION     x1  . COLONOSCOPY W/ POLYPECTOMY  12/2008   x3  . DILATION AND CURETTAGE OF UTERUS    . TONSILLECTOMY    . TOTAL HIP ARTHROPLASTY Right 2008  . TOTAL HIP ARTHROPLASTY Left 05/20/2017   Procedure: LEFT TOTAL HIP ARTHROPLASTY ANTERIOR APPROACH;  Surgeon: Gaynelle Arabian, MD;  Location: [facility];  Service: Orthopedics;  Laterality: Left;  . TOTAL KNEE ARTHROPLASTY Left 02/14/2014   DR Durward Fortes  . TOTAL KNEE ARTHROPLASTY Left 02/14/2014   Procedure: TOTAL KNEE ARTHROPLASTY;  Surgeon: Garald Balding, MD;  Location: [facility];  Service: Orthopedics;  Laterality: Left;     OB History    Gravida  2   Para  2   Term  Preterm      AB      Living  2     SAB      TAB      Ectopic      Multiple      Live Births               Home Medications    Prior to Admission medications   Medication Sig Start Date End Date Taking? Authorizing Provider  amLODipine (NORVASC) 5 MG tablet TAKE 1 TABLET(5 MG) BY MOUTH DAILY 09/14/17  Yes Burns, Claudina Lick, MD  atorvastatin (LIPITOR) 20 MG tablet TAKE 1 TABLET BY MOUTH  DAILY AT 6 PM. Patient taking differently: TAKE 1 TABLET BY MOUTH  DAILY . 04/13/17  Yes Burns, Claudina Lick, MD  citalopram (CELEXA) 40 MG tablet TAKE 1 TABLET BY MOUTH DAILY AS NEEDED Patient taking differently: TAKE 1 TABLET BY MOUTH DAILY 08/17/17  Yes Burns, Claudina Lick, MD  cromolyn (OPTICROM) 4 % ophthalmic solution INT 1 GTT IN OU QID  09/16/17  Yes [provider]  cyclobenzaprine (FLEXERIL) 5 MG tablet Take 5 mg by mouth 3 (three) times daily as needed for muscle spasms.   Yes [provider]  latanoprost (XALATAN) 0.005 % ophthalmic solution INSTILL 1 DROP BOTH EYES AT BEDTIME 05/03/17  Yes [provider]  losartan (COZAAR) 100 MG tablet TAKE 1 TABLET(100 MG) BY MOUTH DAILY 09/22/17  Yes Burns, Claudina Lick, MD  metFORMIN (GLUCOPHAGE) 500 MG tablet Take 1 tablet (500 mg total) by mouth 2 (two) times daily with a meal. 05/11/17  Yes Burns, Claudina Lick, MD  blood glucose meter kit and supplies KIT Dispense based on patient and insurance preference. Use up to four times daily as directed. (FOR E11.9). 05/11/17   Binnie Rail, MD  oxyCODONE (OXY IR/ROXICODONE) 5 MG immediate release tablet Take 1 tablet (5 mg total) by mouth every 4 (four) hours as needed for moderate pain or severe pain. Patient not taking: Reported on 10/11/2017 05/21/17   Dara Lords, Alexzandrew L, PA-C  rivaroxaban (XARELTO) 10 MG TABS tablet Take 1 tablet (10 mg total) by mouth daily with breakfast. Take Xarelto for two and a half more weeks following discharge from the hospital, then discontinue Xarelto. Once the patient has completed the Xarelto, they may resume the 81 mg Aspirin. Patient not taking: Reported on 10/11/2017 05/21/17   Joelene Millin, PA-C    Family History Family History  Problem Relation Age of Onset  . Pancreatic cancer Mother 51  . Hypertension Father   . Heart disease Father   . Diabetes Son     Social History Social History   Tobacco Use  . Smoking status: Former Smoker    Packs/day: 0.25    Years: 1.00    Pack years: 0.25    Types: Cigarettes    Last attempt to quit: 07/08/2010    Years since quitting: 7.2  . Smokeless tobacco: Never Used  Substance Use Topics  . Alcohol use: Yes    Alcohol/week: 0.0 oz    Comment: Rare  . Drug use: No     Allergies   Patient has no known allergies.   Review  of Systems Review of Systems  Musculoskeletal: Positive for arthralgias and myalgias.  Skin: Positive for color change.  All other systems reviewed and are negative.    Physical Exam Updated Vital Signs BP (!) 149/62   Pulse 65   Temp 98.1 F (36.7 C) (Oral)   Resp  17   Ht '5\' 3"'$  (1.6 m)   Wt 99.8 kg (220 lb)   LMP 07/07/2011   SpO2 95%   BMI 38.97 kg/m   Physical Exam  Constitutional: She is oriented to person, place, and time. She appears well-developed and well-nourished. No distress.  HENT:  Head: Normocephalic and atraumatic.  Cardiovascular: Normal rate, regular rhythm and normal heart sounds.  No murmur heard. Pulmonary/Chest: Effort normal and breath sounds normal. No respiratory distress.  Abdominal: Soft. She exhibits no distension. There is no tenderness.  Musculoskeletal:  Significant tenderness to the groin and lateral left hip.  Decreased range of motion likely secondary to pain.  Pain reproducible with internal and external rotation, not as much so with flexion or extension.  No tenderness to the knee.  Ecchymosis over the left shoulder, however no tenderness.  Full range of motion 5/5 strength left upper extremity.  No tenderness to T or L-spine.  Bilateral lower extremities are neurovascularly intact.  Neurological: She is alert and oriented to person, place, and time.  Skin: Skin is warm and dry.  Nursing note and vitals reviewed.    ED Treatments / Results  Labs (all labs ordered are listed, but only abnormal results are displayed) Labs Reviewed - No data to display  EKG None  Radiology Ct Hip Left Wo Contrast  Result Date: 10/11/2017 CLINICAL DATA:  Patient fell in shower yesterday and presents with left hip pain, worse with movement and ambulation. EXAM: CT OF THE LEFT HIP WITHOUT CONTRAST TECHNIQUE: Multidetector CT imaging of the left hip was performed according to the standard protocol. Multiplanar CT image reconstructions were also generated.  COMPARISON:  Radiographs dating back through 05/20/2017 of the left hip. FINDINGS: Study is limited by metallic streak artifacts emanating from the patient's left hip arthroplasty. Bones/Joint/Cartilage Intact left uncemented total hip arthroplasty with heterotopic new bone formation about the left hip. No hardware failure or loosening is identified. Intact pubic rami. 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 No hip joint effusion. No acute displaced fracture identified. Ligaments Suboptimally assessed by CT. Muscles and Tendons No muscle atrophy or fatty replacement. No intramuscular hemorrhage. Soft tissues Diverticulosis of the adjacent included sigmoid and descending colon. Small fat containing left inguinal hernia. Subcutaneous soft tissue scarring along the lateral aspect of the left hip. IMPRESSION: 1. Intact uncemented left hip arthroplasty with heterotopic new bone formation about the left hip since prior radiographs from 05/20/2017. No apparent fracture or joint dislocation. No hardware loosening. 2. 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 Electronically Signed   By: Ashley Royalty M.D.   On: 10/11/2017 20:06   Dg Hip Unilat With Pelvis 2-3 Views Left  Result Date: 10/11/2017 CLINICAL DATA:  [place] in shower yesterday with left hip pain. Hip replacement 4 months ago with chronic left hip bursitis. EXAM: DG HIP (WITH OR WITHOUT PELVIS) 2-3V LEFT COMPARISON:  05/20/2017 FINDINGS: Right hip arthroplasty intact. Left total hip arthroplasty intact and unchanged. No evidence of acute fracture or dislocation. Mild degenerate change of the spine. IMPRESSION: Bilateral total hip arthroplasties.  No acute findings. Electronically Signed   By: Marin Olp M.D.   On: 10/11/2017 13:54    Procedures Procedures (including critical care time)  Medications Ordered in ED Medications  oxyCODONE-acetaminophen (PERCOCET/ROXICET) 5-325 MG per tablet 1 tablet (1 tablet Oral Given  10/11/17 1928)     Initial Impression / Assessment and Plan / ED Course  I have reviewed the triage vital signs  and the nursing notes.  Pertinent labs & imaging results that were available during my care of the patient were reviewed by me and considered in my medical decision making (see chart for details).    Katherine Lawrence is a 63 y.o. female who presents to ED for evaluation of left hip pain after mechanical fall yesterday.  She is status post hip replacement in December and had been doing well up until the fall today.  On exam, patient is afebrile, hemodynamically stable and neurovascularly intact.  She does have significant amount tenderness to the left groin lateral hip as well as decreased range of motion likely secondary to her pain.  X-rays initially obtained and negative.  She has been unable to put any pressure on the left extremity and unable to walk in the emergency department initially.  Obtained CT with no fracture or dislocation appreciated.  Hardware all appears intact.  Following pain control, she was able to place gentle pressure and walk with walker, however very guarded. Evaluation does not show pathology that would require ongoing emergent intervention or inpatient treatment.  She has an appointment with her orthopedist already scheduled for May 16th and was encouraged to keep this appointment.  She can call to try to get in sooner if her symptoms are not improving in the next couple of days.  Reasons to return to the ER were discussed and all questions answered.  Final Clinical Impressions(s) / ED Diagnoses   Final diagnoses:  Fall, initial encounter  Pain of left hip joint    ED Discharge Orders    None       Kinney Sackmann, Ozella Almond, PA-C 10/11/17 2059    Virgel Manifold, MD 10/12/17 [phone]

## 2017-10-23 ENCOUNTER — Other Ambulatory Visit: Payer: Self-pay | Admitting: Internal Medicine

## 2017-11-06 ENCOUNTER — Ambulatory Visit: Payer: Self-pay | Admitting: Internal Medicine

## 2017-11-12 NOTE — Progress Notes (Signed)
Subjective:    Patient ID: Katherine Lawrence, female    DOB: 03-11-1956, 62 y.o.   MRN: [id number]  HPI The patient is here for follow up.  Patient Active Problem List   Diagnosis Date Noted  . OA (osteoarthritis) of hip 05/20/2017  . Preoperative examination 05/11/2017  . Osteoarthritis of left hip 05/10/2017  . Memory loss 04/13/2015  . Type 2 diabetes mellitus without complication, without long-term current use of insulin ([place]) 04/13/2015  . Obese 03/21/2015  . 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 Osteoarthritis of left knee   . Concentration deficit   . Postsurgical menopause 07/08/2012  . 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 Ovarian cyst 07/21/2011  . Mild depression ([place]) 10/19/2008  . HYPERCHOLESTEROLEMIA 04/03/2008  . Essential hypertension 04/03/2008  . Myalgia and myositis 04/03/2008    Current Outpatient Medications on File Prior to Visit  Medication Sig Dispense Refill  . amLODipine (NORVASC) 5 MG tablet TAKE 1 TABLET(5 MG) BY MOUTH DAILY 90 tablet 0  . atorvastatin (LIPITOR) 20 MG tablet TAKE 1 TABLET BY MOUTH  DAILY AT 6 PM. (Patient taking differently: TAKE 1 TABLET BY MOUTH  DAILY .) 90 tablet 0  . blood glucose meter kit and supplies KIT Dispense based on patient and insurance preference. Use up to four times daily as directed. (FOR E11.9). 1 each 0  . citalopram (CELEXA) 40 MG tablet TAKE 1 TABLET BY MOUTH DAILY AS NEEDED (Patient taking differently: TAKE 1 TABLET BY MOUTH DAILY) 45 tablet 0  . cromolyn (OPTICROM) 4 % ophthalmic solution INT 1 GTT IN OU QID  0  . cyclobenzaprine (FLEXERIL) 5 MG tablet Take 5 mg by mouth 3 (three) times daily as needed for muscle spasms.    Marland Kitchen latanoprost (XALATAN) 0.005 % ophthalmic solution INSTILL 1 DROP BOTH EYES AT BEDTIME  11  . losartan (COZAAR) 100 MG tablet TAKE 1 TABLET(100 MG) BY MOUTH DAILY 90 tablet 0  . metFORMIN (GLUCOPHAGE) 500 MG tablet Take 1 tablet (500 mg total) by mouth 2 (two) times daily with a  meal. 180 tablet 1  . oxyCODONE (OXY IR/ROXICODONE) 5 MG immediate release tablet Take 1 tablet (5 mg total) by mouth every 4 (four) hours as needed for moderate pain or severe pain. (Patient not taking: Reported on 10/11/2017) 60 tablet 0  . rivaroxaban (XARELTO) 10 MG TABS tablet Take 1 tablet (10 mg total) by mouth daily with breakfast. Take Xarelto for two and a half more weeks following discharge from the hospital, then discontinue Xarelto. Once the patient has completed the Xarelto, they may resume the 81 mg Aspirin. (Patient not taking: Reported on 10/11/2017) 20 tablet 0   No current facility-administered medications on file prior to visit.     Past Medical History:  Diagnosis Date  . CIN I (cervical intraepithelial neoplasia I) 1996   cryo...  cone of cervix  . DEPRESSION   . Diabetes type 2, controlled ()   . Endometrial polyp 07/21/2011  . Heart murmur    age 28   . HYPERCHOLESTEROLEMIA   . HYPERTENSION   . Osteoarthritis    hips and knees  . Pneumonia 06/2003  . Postsurgical menopause     Past Surgical History:  Procedure Laterality Date  . ABDOMINAL HYSTERECTOMY  08/22/2011  . CERVICAL CONE BIOPSY  1996  . CESAREAN SECTION     x1  . COLONOSCOPY W/ POLYPECTOMY  12/2008   x3  . DILATION AND CURETTAGE OF UTERUS    .  TONSILLECTOMY    . TOTAL HIP ARTHROPLASTY Right 2008  . TOTAL HIP ARTHROPLASTY Left 05/20/2017   Procedure: LEFT TOTAL HIP ARTHROPLASTY ANTERIOR APPROACH;  Surgeon: Gaynelle Arabian, MD;  Location: [facility];  Service: Orthopedics;  Laterality: Left;  . TOTAL KNEE ARTHROPLASTY Left 02/14/2014   DR Durward Fortes  . TOTAL KNEE ARTHROPLASTY Left 02/14/2014   Procedure: TOTAL KNEE ARTHROPLASTY;  Surgeon: Garald Balding, MD;  Location: [facility];  Service: Orthopedics;  Laterality: Left;    Social History   Socioeconomic History  . Marital status: Married    Spouse name: Not on file  . Number of children: 2  . Years of education: Not on file  . Highest education  level: Not on file  Occupational History  . Occupation: Land: Theme park manager    Comment: works from home  Social Needs  . Financial resource strain: Not on file  . Food insecurity:    Worry: Not on file    Inability: Not on file  . Transportation needs:    Medical: Not on file    Non-medical: Not on file  Tobacco Use  . Smoking status: Former Smoker    Packs/day: 0.25    Years: 1.00    Pack years: 0.25    Types: Cigarettes    Last attempt to quit: 07/08/2010    Years since quitting: 7.3  . Smokeless tobacco: Never Used  Substance and Sexual Activity  . Alcohol use: Yes    Alcohol/week: 0.0 oz    Comment: Rare  . Drug use: No  . Sexual activity: Not Currently    Comment: 1st intercourse- 14, partners- 9, married- 2 yrs   Lifestyle  . Physical activity:    Days per week: Not on file    Minutes per session: Not on file  . Stress: Not on file  Relationships  . Social connections:    Talks on phone: Not on file    Gets together: Not on file    Attends religious service: Not on file    Active member of club or organization: Not on file    Attends meetings of clubs or organizations: Not on file    Relationship status: Not on file  Other Topics Concern  . Not on file  Social History Narrative   Lives with spouse; grown kids   Pt is also a Ship broker.   Regular exercise-yes    Family History  Problem Relation Age of Onset  . Pancreatic cancer Mother 77  . Hypertension Father   . Heart disease Father   . Diabetes Son     Review of Systems     Objective:  There were no vitals filed for this visit. BP Readings from Last 3 Encounters:  10/11/17 (!) 149/62  07/13/17 (!) 144/92  05/21/17 (!) 133/59   Wt Readings from Last 3 Encounters:  10/11/17 220 lb (99.8 kg)  07/13/17 238 lb (108 kg)  05/20/17 231 lb (104.8 kg)   There is no height or weight on file to calculate BMI.   Physical Exam    Constitutional: Appears well-developed and  well-nourished. No distress.  HENT:  Head: Normocephalic and atraumatic.  Neck: Neck supple. No tracheal deviation present. No thyromegaly present.  No cervical lymphadenopathy Cardiovascular: Normal rate, regular rhythm and normal heart sounds.   No murmur heard. No carotid bruit .  No edema Pulmonary/Chest: Effort normal and breath sounds normal. No respiratory distress. No has no wheezes. No rales.  Skin: Skin is warm and dry. Not diaphoretic.  Psychiatric: Normal mood and affect. Behavior is normal.      Assessment & Plan:    See Problem List for Assessment and Plan of chronic medical problems.   This encounter was created in error - please disregard.

## 2017-11-12 NOTE — Patient Instructions (Signed)
  Test(s) ordered today. Your results will be released to MyChart (or called to you) after review, usually within 72hours after test completion. If any changes need to be made, you will be notified at that same time.  All other Health Maintenance issues reviewed.   All recommended immunizations and age-appropriate screenings are up-to-date or discussed.  No immunizations administered today.   Medications reviewed and updated.  Changes include  /  No changes recommended at this time.  Your prescription(s) have been submitted to your pharmacy. Please take as directed and contact our office if you believe you are having problem(s) with the medication(s).  A referral was ordered for   Please followup in 6 months   

## 2017-11-13 ENCOUNTER — Encounter: Payer: Self-pay | Admitting: Internal Medicine

## 2017-11-13 DIAGNOSIS — Z0289 Encounter for other administrative examinations: Secondary | ICD-10-CM

## 2017-11-19 NOTE — Progress Notes (Signed)
Subjective:    Patient ID: Katherine Lawrence, female    DOB: Mar 18, 1956, 62 y.o.   MRN: [id number]  HPI The patient is here for follow up.  Diabetes: She is taking her medication daily as prescribed. She is compliant with a diabetic diet. She is not exercising regularly.  She is up-to-date with an ophthalmology examination.   Hypertension: She is taking her medication daily. She is compliant with a low sodium diet.  She denies chest pain, palpitations, edema, shortness of breath and regular headaches. She is not exercising regularly.      Hyperlipidemia: She is taking her medication daily. She is compliant with a low fat/cholesterol diet. She is not exercising regularly.    Depression, anxiety: She is taking her medication daily as prescribed. She denies any side effects from the medication.  Overall she is happy with her dose of medication and does not want to make any changes, but states her anxiety and stress level are not ideally controlled.  This is all related to work.   Memory concerns;  She called her husband today and told her she had a tick on her this morning and forgot she told him.  She called him later and  told him about the tick - she had totally forget she told him.  She does not remember names.  She often forgets things at work.  Work is being affected.  She is having difficulty keeping up with the new technology and the changes that have been made.  She struggles to do her job.  She often will leave work at 4:30 at night and then come back to work and stay until midnight to try to catch up.  She works on the weekends.  She often goes to work early so that she can figure things out to help her do her job.  She snores sometimes.  Her husband is never told her that she stops breathing at night.   She had her left hip replacement in December.  She developed bursitis after the surgery.  She is very limited by the pain from the bursitis.  Her left knee hurts - had a lot of scar  tissue.  Medications and allergies reviewed with patient and updated if appropriate.  Patient Active Problem List   Diagnosis Date Noted  . OA (osteoarthritis) of hip 05/20/2017  . Osteoarthritis of left hip 05/10/2017  . Memory difficulties 04/13/2015  . Type 2 diabetes mellitus without complication, without long-term current use of insulin ([place]) 04/13/2015  . Obese 03/21/2015  . 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 Osteoarthritis of left knee   . Concentration deficit   . 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 Ovarian cyst 07/21/2011  . Mild depression ([place]) 10/19/2008  . HYPERCHOLESTEROLEMIA 04/03/2008  . Essential hypertension 04/03/2008  . Myalgia and myositis 04/03/2008    Current Outpatient Medications on File Prior to Visit  Medication Sig Dispense Refill  . amLODipine (NORVASC) 5 MG tablet TAKE 1 TABLET(5 MG) BY MOUTH DAILY 90 tablet 0  . atorvastatin (LIPITOR) 20 MG tablet TAKE 1 TABLET BY MOUTH  DAILY AT 6 PM. (Patient taking differently: TAKE 1 TABLET BY MOUTH  DAILY .) 90 tablet 0  . blood glucose meter kit and supplies KIT Dispense based on patient and insurance preference. Use up to four times daily as directed. (FOR E11.9). 1 each 0  . citalopram (CELEXA) 40 MG tablet TAKE 1 TABLET BY MOUTH DAILY AS NEEDED (Patient taking  differently: TAKE 1 TABLET BY MOUTH DAILY) 45 tablet 0  . cromolyn (OPTICROM) 4 % ophthalmic solution INT 1 GTT IN OU QID  0  . cyclobenzaprine (FLEXERIL) 5 MG tablet Take 5 mg by mouth 3 (three) times daily as needed for muscle spasms.    Marland Kitchen latanoprost (XALATAN) 0.005 % ophthalmic solution INSTILL 1 DROP BOTH EYES AT BEDTIME  11  . losartan (COZAAR) 100 MG tablet TAKE 1 TABLET(100 MG) BY MOUTH DAILY 90 tablet 0  . metFORMIN (GLUCOPHAGE) 500 MG tablet Take 1 tablet (500 mg total) by mouth 2 (two) times daily with a meal. 180 tablet 1   No current facility-administered medications on file prior to visit.     Past Medical History:    Diagnosis Date  . CIN I (cervical intraepithelial neoplasia I) 1996   cryo...  cone of cervix  . DEPRESSION   . Diabetes type 2, controlled ([place])   . Endometrial polyp 07/21/2011  . Heart murmur    age 42   . HYPERCHOLESTEROLEMIA   . HYPERTENSION   . Osteoarthritis    hips and knees  . Pneumonia 06/2003  . Postsurgical menopause     Past Surgical History:  Procedure Laterality Date  . ABDOMINAL HYSTERECTOMY  08/22/2011  . CERVICAL CONE BIOPSY  1996  . CESAREAN SECTION     x1  . COLONOSCOPY W/ POLYPECTOMY  12/2008   x3  . DILATION AND CURETTAGE OF UTERUS    . TONSILLECTOMY    . TOTAL HIP ARTHROPLASTY Right 2008  . TOTAL HIP ARTHROPLASTY Left 05/20/2017   Procedure: LEFT TOTAL HIP ARTHROPLASTY ANTERIOR APPROACH;  Surgeon: Gaynelle Arabian, MD;  Location: [facility];  Service: Orthopedics;  Laterality: Left;  . TOTAL KNEE ARTHROPLASTY Left 02/14/2014   DR Durward Fortes  . TOTAL KNEE ARTHROPLASTY Left 02/14/2014   Procedure: TOTAL KNEE ARTHROPLASTY;  Surgeon: Garald Balding, MD;  Location: [facility];  Service: Orthopedics;  Laterality: Left;    Social History   Socioeconomic History  . Marital status: Married    Spouse name: Not on file  . Number of children: 2  . Years of education: Not on file  . Highest education level: Not on file  Occupational History  . Occupation: Land: Theme park manager    Comment: works from home  Social Needs  . Financial resource strain: Not on file  . Food insecurity:    Worry: Not on file    Inability: Not on file  . Transportation needs:    Medical: Not on file    Non-medical: Not on file  Tobacco Use  . Smoking status: Former Smoker    Packs/day: 0.25    Years: 1.00    Pack years: 0.25    Types: Cigarettes    Last attempt to quit: 07/08/2010    Years since quitting: 7.3  . Smokeless tobacco: Never Used  Substance and Sexual Activity  . Alcohol use: Yes    Alcohol/week: 0.0 oz    Comment: Rare  . Drug use: No  . Sexual  activity: Not Currently    Comment: 1st intercourse- 14, partners- 19, married- 66 yrs   Lifestyle  . Physical activity:    Days per week: Not on file    Minutes per session: Not on file  . Stress: Not on file  Relationships  . Social connections:    Talks on phone: Not on file    Gets together: Not on file    Attends  religious service: Not on file    Active member of club or organization: Not on file    Attends meetings of clubs or organizations: Not on file    Relationship status: Not on file  Other Topics Concern  . Not on file  Social History Narrative   Lives with spouse; grown kids   Pt is also a Ship broker.   Regular exercise-yes    Family History  Problem Relation Age of Onset  . Pancreatic cancer Mother 7  . Hypertension Father   . Heart disease Father   . Diabetes Son     Review of Systems  Constitutional: Negative for chills and fever.  Respiratory: Negative for cough, shortness of breath and wheezing.   Cardiovascular: Negative for chest pain, palpitations and leg swelling.  Musculoskeletal: Positive for arthralgias and gait problem (from lightheadedness).  Neurological: Positive for light-headedness (intermittent). Negative for headaches.       Objective:   Vitals:   11/20/17 1521  BP: 130/86  Pulse: 82  Resp: 16  Temp: 98.2 F (36.8 C)  SpO2: 94%   BP Readings from Last 3 Encounters:  11/20/17 130/86  10/11/17 (!) 149/62  07/13/17 (!) 144/92   Wt Readings from Last 3 Encounters:  11/20/17 223 lb (101.2 kg)  10/11/17 220 lb (99.8 kg)  07/13/17 238 lb (108 kg)   Body mass index is 39.5 kg/m.   Physical Exam    Constitutional: Appears well-developed and well-nourished. No distress.  HENT:  Head: Normocephalic and atraumatic.  Neck: Neck supple. No tracheal deviation present. No thyromegaly present.  No cervical lymphadenopathy Cardiovascular: Normal rate, regular rhythm and normal heart sounds.   2/6 systolic murmur heard. No carotid  bruit .  No edema Pulmonary/Chest: Effort normal and breath sounds normal. No respiratory distress. No has no wheezes. No rales.  Skin: Skin is warm and dry. Not diaphoretic.  Psychiatric: Normal mood and affect. Behavior is normal.      Assessment & Plan:    See Problem List for Assessment and Plan of chronic medical problems.

## 2017-11-20 ENCOUNTER — Ambulatory Visit (INDEPENDENT_AMBULATORY_CARE_PROVIDER_SITE_OTHER): Payer: 59 | Admitting: Internal Medicine

## 2017-11-20 ENCOUNTER — Other Ambulatory Visit (INDEPENDENT_AMBULATORY_CARE_PROVIDER_SITE_OTHER): Payer: 59

## 2017-11-20 ENCOUNTER — Encounter: Payer: Self-pay | Admitting: Internal Medicine

## 2017-11-20 VITALS — BP 130/86 | HR 82 | Temp 98.2°F | Resp 16 | Wt 223.0 lb

## 2017-11-20 DIAGNOSIS — R413 Other amnesia: Secondary | ICD-10-CM

## 2017-11-20 DIAGNOSIS — F32A Depression, unspecified: Secondary | ICD-10-CM

## 2017-11-20 DIAGNOSIS — E119 Type 2 diabetes mellitus without complications: Secondary | ICD-10-CM

## 2017-11-20 DIAGNOSIS — F419 Anxiety disorder, unspecified: Secondary | ICD-10-CM

## 2017-11-20 DIAGNOSIS — F320 Major depressive disorder, single episode, mild: Secondary | ICD-10-CM

## 2017-11-20 DIAGNOSIS — I10 Essential (primary) hypertension: Secondary | ICD-10-CM

## 2017-11-20 LAB — COMPREHENSIVE METABOLIC PANEL
ALK PHOS: 65 U/L (ref 39–117)
ALT: 11 U/L (ref 0–35)
AST: 12 U/L (ref 0–37)
Albumin: 4.5 g/dL (ref 3.5–5.2)
BILIRUBIN TOTAL: 0.6 mg/dL (ref 0.2–1.2)
BUN: 20 mg/dL (ref 6–23)
CO2: 31 meq/L (ref 19–32)
CREATININE: 0.6 mg/dL (ref 0.40–1.20)
Calcium: 9.8 mg/dL (ref 8.4–10.5)
Chloride: 102 mEq/L (ref 96–112)
GFR: 108.46 mL/min (ref >60.00)
GLUCOSE: 93 mg/dL (ref 70–99)
Potassium: 3.6 mEq/L (ref 3.5–5.1)
Sodium: 142 mEq/L (ref 135–145)
TOTAL PROTEIN: 7.2 g/dL (ref 6.0–8.3)

## 2017-11-20 LAB — TSH: TSH: 2.59 u[IU]/mL (ref 0.35–4.50)

## 2017-11-20 LAB — CBC WITH DIFFERENTIAL/PLATELET
BASOS ABS: 0.1 10*3/uL (ref 0.0–0.1)
Basophils Relative: 1 % (ref 0.0–3.0)
EOS PCT: 2.3 % (ref 0.0–5.0)
Eosinophils Absolute: 0.3 10*3/uL (ref 0.0–0.7)
HCT: 44.7 % (ref 36.0–46.0)
Hemoglobin: 14.7 g/dL (ref 12.0–15.0)
LYMPHS ABS: 2.9 10*3/uL (ref 0.7–4.0)
Lymphocytes Relative: 26.4 % (ref 12.0–46.0)
MCHC: 32.8 g/dL (ref 30.0–36.0)
MCV: 85.7 fl (ref 78.0–100.0)
MONO ABS: 0.6 10*3/uL (ref 0.1–1.0)
MONOS PCT: 5.3 % (ref 3.0–12.0)
NEUTROS ABS: 7.2 10*3/uL (ref 1.4–7.7)
NEUTROS PCT: 65 % (ref 43.0–77.0)
PLATELETS: 266 10*3/uL (ref 150.0–400.0)
RBC: 5.22 Mil/uL — AB (ref 3.87–5.11)
RDW: 14.8 % (ref 11.5–15.5)
WBC: 11.1 10*3/uL — ABNORMAL HIGH (ref 4.0–10.5)

## 2017-11-20 LAB — LIPID PANEL
CHOLESTEROL: 200 mg/dL (ref 0–200)
HDL: 43 mg/dL (ref >39.00)
LDL Cholesterol: 141 mg/dL — ABNORMAL HIGH (ref 0–99)
NONHDL: 156.76
Total CHOL/HDL Ratio: 5
Triglycerides: 80 mg/dL (ref 0.0–149.0)
VLDL: 16 mg/dL (ref 0.0–40.0)

## 2017-11-20 LAB — VITAMIN B12: Vitamin B-12: 282 pg/mL (ref 211–911)

## 2017-11-20 LAB — HEMOGLOBIN A1C: HEMOGLOBIN A1C: 6.4 % (ref 4.6–6.5)

## 2017-11-20 MED ORDER — CITALOPRAM HYDROBROMIDE 40 MG PO TABS: 40.0000 mg | ORAL_TABLET | Freq: Every day | ORAL | 1 refills | Status: DC

## 2017-11-20 NOTE — Assessment & Plan Note (Signed)
She is experiencing very high anxiety and stress related to work-she is having difficulty performing her job with all the changes in technology and the pressure at work She is taking Celexa 40 mg daily and has been happy with medication-discussed that we could consider changing this to something different, but she would like to stay on her current medication and current dose for now

## 2017-11-20 NOTE — Assessment & Plan Note (Signed)
BP well controlled Current regimen effective and well tolerated Continue current medications at current doses cmp  

## 2017-11-20 NOTE — Patient Instructions (Addendum)
  Test(s) ordered today. Your results will be released to [facility] (or called to you) after review, usually within 72hours after test completion. If any changes need to be made, you will be notified at that same time.   Medications reviewed and updated.  No changes recommended at this time.  Your prescription(s) have been submitted to your pharmacy. Please take as directed and contact our office if you believe you are having problem(s) with the medication(s).  A referral was ordered for neurology.  Please followup in 6 months

## 2017-11-20 NOTE — Assessment & Plan Note (Signed)
Check A1c Compliant with diabetic diet Not able to exercise related to orthopedic issues Eye exam up-to-date-we will obtain report

## 2017-11-20 NOTE — Assessment & Plan Note (Signed)
She is experiencing very high anxiety and stress related to work-this does cause some depression, but she thinks overall the depression is okay She is taking Celexa 40 mg daily and has been happy with medication-discussed that we could consider changing this to something different, but she would like to stay on her current medication and current dose for now

## 2017-11-20 NOTE — Assessment & Plan Note (Signed)
She is very concerned regarding her memory difficulties.  This is not the first time that she had concerns regarding her memory 2016 she had an MRI that showed frontal cerebral atrophy that was more pronounced than expected for her age-this does make her think more about the memory concerns She does have a lot of anxiety and stress with work right now-none at home She does not want to make any changes in her anxiety/depression medications She would like her memory to be evaluated more because that is concerning her Will refer for neuropsychology testing Will check TSH, B12 Discussed with her that most likely some of her memory difficulties is related to anxiety and stress

## 2017-11-24 ENCOUNTER — Other Ambulatory Visit: Payer: Self-pay | Admitting: Internal Medicine

## 2017-11-25 ENCOUNTER — Telehealth: Payer: Self-pay | Admitting: Internal Medicine

## 2017-11-25 NOTE — Telephone Encounter (Signed)
Noted  

## 2017-11-25 NOTE — Telephone Encounter (Signed)
Pt called for lab results. I gave them to her and she expressed understanding.

## 2017-11-28 ENCOUNTER — Other Ambulatory Visit: Payer: Self-pay | Admitting: Internal Medicine

## 2017-12-08 ENCOUNTER — Encounter: Payer: Self-pay | Admitting: Internal Medicine

## 2017-12-14 ENCOUNTER — Encounter: Payer: Self-pay | Admitting: Psychology

## 2018-02-18 DIAGNOSIS — M25559 Pain in unspecified hip: Secondary | ICD-10-CM

## 2018-02-18 HISTORY — DX: Pain in unspecified hip: M25.559

## 2018-02-24 ENCOUNTER — Other Ambulatory Visit (HOSPITAL_COMMUNITY): Payer: Self-pay | Admitting: Orthopedic Surgery

## 2018-02-24 DIAGNOSIS — M25562 Pain in left knee: Secondary | ICD-10-CM

## 2018-03-01 ENCOUNTER — Encounter (HOSPITAL_COMMUNITY)
Admission: RE | Admit: 2018-03-01 | Discharge: 2018-03-01 | Disposition: A | Discharge status: Home / Self Care | Payer: 59 | Source: Ambulatory Visit | Attending: Orthopedic Surgery | Admitting: Orthopedic Surgery

## 2018-03-01 DIAGNOSIS — M25562 Pain in left knee: Secondary | ICD-10-CM | POA: Diagnosis present

## 2018-03-01 MED ORDER — TECHNETIUM TC 99M MEDRONATE IV KIT
21.5000 | PACK | Freq: Once | INTRAVENOUS | Status: AC | PRN
  Administered 2018-03-01: 21.5 via INTRAVENOUS

## 2018-03-30 LAB — HM MAMMOGRAPHY

## 2018-04-01 ENCOUNTER — Encounter: Payer: Self-pay | Admitting: Internal Medicine

## 2018-04-01 ENCOUNTER — Other Ambulatory Visit: Payer: Self-pay

## 2018-05-03 ENCOUNTER — Telehealth: Payer: Self-pay

## 2018-05-03 NOTE — Telephone Encounter (Signed)
Patient saw you last Jan and was diagnosed with Grade 1-2 rectocele. She said it is a little worse. SHe said she will not be able to come this January as she is having "total knee revision" and will probably be April or May.  She questioned if you thought that would be okay to wait or should she come sooner for RG problem visit.

## 2018-05-04 NOTE — Telephone Encounter (Signed)
Spoke with patient this morning and recommended RG visit to let Dr. Dellis Filbert examine her and advise regarding rectocele.  Appt scheduled.

## 2018-05-11 DIAGNOSIS — H2513 Age-related nuclear cataract, bilateral: Secondary | ICD-10-CM | POA: Diagnosis not present

## 2018-05-11 DIAGNOSIS — H04123 Dry eye syndrome of bilateral lacrimal glands: Secondary | ICD-10-CM | POA: Diagnosis not present

## 2018-05-11 DIAGNOSIS — H10413 Chronic giant papillary conjunctivitis, bilateral: Secondary | ICD-10-CM | POA: Diagnosis not present

## 2018-05-11 DIAGNOSIS — H40053 Ocular hypertension, bilateral: Secondary | ICD-10-CM | POA: Diagnosis not present

## 2018-05-18 ENCOUNTER — Encounter: Payer: Self-pay | Admitting: Obstetrics & Gynecology

## 2018-05-18 ENCOUNTER — Ambulatory Visit (INDEPENDENT_AMBULATORY_CARE_PROVIDER_SITE_OTHER): Payer: BLUE CROSS/BLUE SHIELD | Admitting: Obstetrics & Gynecology

## 2018-05-18 VITALS — BP 136/88

## 2018-05-18 DIAGNOSIS — N816 Rectocele: Secondary | ICD-10-CM

## 2018-05-18 NOTE — Progress Notes (Signed)
    Katherine Lawrence 08/18/1955 [id number]        62 y.o.  G2P2L2 Married  RP: Rectocele with bulging at vulva  HPI: Feels a little more bulging at vulva when walking and at times of BMs. No difficulty passing stools.  No constipation.  No SUI.  No pain associated with bulging, just occasionally uncomfortable.  S/P TAH/BSO.  No HRT.  Abstinent.     OB History  Gravida Para Term Preterm AB Living  2 2       2   SAB TAB Ectopic Multiple Live Births               # Outcome Date GA Lbr Len/2nd Weight Sex Delivery Anes PTL Lv  2 Para           1 Para             Past medical history,surgical history, problem list, medications, allergies, family history and social history were all reviewed and documented in the EPIC chart.   Directed ROS with pertinent positives and negatives documented in the history of present illness/assessment and plan.  Exam:  Vitals:   05/18/18 1517  BP: 136/88   General appearance:  Normal  Gynecologic exam: Vulva normal.  Vaginal exam laying down:  No colpocele, no cystocele.  Rectocele grade 2/3 with valsalva.  Vaginal exam standing:  No colpocele, no cystocele.  Rectocele grade 2-3/3.   Assessment/Plan:  62 y.o. G2P2   1. Rectocele Mild progression of Rectocele since 06/2017 to grade 2-3/3.  Thorough counseling on management of Rectocele.  Decision to observe at this time.  Avoid pelvic pressure.  Pessary/Surgery reviewed.  Would prefer Rectocele repair if/when more symptomatic.  Counseling on above issues and coordination of care more than 50% for 25 minutes.  Princess Bruins MD, 3:34 PM 05/18/2018

## 2018-05-19 ENCOUNTER — Encounter: Payer: Self-pay | Admitting: Obstetrics & Gynecology

## 2018-05-19 NOTE — Patient Instructions (Signed)
1. Rectocele Mild progression of Rectocele since 06/2017 to grade 2-3/3.  Thorough counseling on management of Rectocele.  Decision to observe at this time.  Avoid pelvic pressure.  Pessary/Surgery reviewed.  Would prefer Rectocele repair if/when more symptomatic.  Shearon, it was a pleasure seeing you today!

## 2018-05-23 NOTE — Progress Notes (Signed)
Subjective:    Patient ID: Katherine Lawrence, female    DOB: 1955/12/31, 62 y.o.   MRN: [id number]  HPI The patient is here for follow up.  Diabetes: She is taking her medication daily as prescribed. She is compliant with a diabetic diet. She is none exercising regularly. She checks her feet daily and denies foot lesions.    Hypertension: She is taking her medication daily. She is compliant with a low sodium diet.  She denies chest pain, palpitations, edema, shortness of breath and regular headaches. She is none exercising regularly.      Hyperlipidemia: She is taking her medication daily. She is compliant with a low fat/cholesterol diet. She is none exercising regularly. She denies myalgias.   Depression, anxiety: She is taking her medication daily as prescribed. She denies any side effects from the medication. She feels her depression and anxiety are well controlled and she is happy with her current dose of medication.   She had muscle pain in her arms after her first hip surgery.  The pain started in her legs after her last hip surgery, 05/20/17.  The pain is constant. 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  She has no N/T.  She also has neck pain and the pain radiates down to her shoulders.  She has difficulty getting out of a chair.  She has poor balance.  She took 6 advil before leaving today.  Muscle tender to touch.  She has difficulty doing her ADL's.  She feels spacy or dizziness when she stands up.   Needs surgical clearance - no history of difficulty with surgery, anesthesia and bleeding or blood clot problems.     With her household activities she denies SOB, chest pain and palpitations.      Medications and allergies reviewed with patient and updated if appropriate.  Patient Active Problem List   Diagnosis Date Noted  . Anxiety 11/20/2017  . OA (osteoarthritis) of hip 05/20/2017  . Osteoarthritis of left hip 05/10/2017  . Memory difficulties 04/13/2015  . Type 2  diabetes mellitus without complication, without long-term current use of insulin ([place]) 04/13/2015  . Obese 03/21/2015  . 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 Osteoarthritis of left knee   . Concentration deficit   . 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 Ovarian cyst 07/21/2011  . Mild depression ([place]) 10/19/2008  . HYPERCHOLESTEROLEMIA 04/03/2008  . Essential hypertension 04/03/2008  . Myalgia 04/03/2008    Current Outpatient Medications on File Prior to Visit  Medication Sig Dispense Refill  . amLODipine (NORVASC) 5 MG tablet TAKE 1 TABLET(5 MG) BY MOUTH DAILY 90 tablet 3  . atorvastatin (LIPITOR) 20 MG tablet TAKE 1 TABLET BY MOUTH  DAILY AT 6 PM. (Patient taking differently: TAKE 1 TABLET BY MOUTH  DAILY .) 90 tablet 0  . blood glucose meter kit and supplies KIT Dispense based on patient and insurance preference. Use up to four times daily as directed. (FOR E11.9). 1 each 0  . citalopram (CELEXA) 40 MG tablet Take 1 tablet (40 mg total) by mouth daily. 90 tablet 1  . cromolyn (OPTICROM) 4 % ophthalmic solution INT 1 GTT IN OU QID  0  . latanoprost (XALATAN) 0.005 % ophthalmic solution INSTILL 1 DROP BOTH EYES AT BEDTIME  11  . losartan (COZAAR) 100 MG tablet TAKE 1 TABLET(100 MG) BY MOUTH DAILY 90 tablet 1  . metFORMIN (GLUCOPHAGE) 500 MG tablet TAKE 1 TABLET(500 MG) BY MOUTH TWICE  DAILY WITH A MEAL 180 tablet 1   No current facility-administered medications on file prior to visit.     Past Medical History:  Diagnosis Date  . CIN I (cervical intraepithelial neoplasia I) 1996   cryo...  cone of cervix  . DEPRESSION   . Diabetes type 2, controlled ([place])   . Endometrial polyp 07/21/2011  . Heart murmur    age 64   . HYPERCHOLESTEROLEMIA   . HYPERTENSION   . Osteoarthritis    hips and knees  . Pneumonia 06/2003  . Postsurgical menopause     Past Surgical History:  Procedure Laterality Date  . ABDOMINAL HYSTERECTOMY  08/22/2011  . CERVICAL CONE BIOPSY  1996  .  CESAREAN SECTION     x1  . COLONOSCOPY W/ POLYPECTOMY  12/2008   x3  . DILATION AND CURETTAGE OF UTERUS    . TONSILLECTOMY    . TOTAL HIP ARTHROPLASTY Right 2008  . TOTAL HIP ARTHROPLASTY Left 05/20/2017   Procedure: LEFT TOTAL HIP ARTHROPLASTY ANTERIOR APPROACH;  Surgeon: Gaynelle Arabian, MD;  Location: [facility];  Service: Orthopedics;  Laterality: Left;  . TOTAL KNEE ARTHROPLASTY Left 02/14/2014   DR Durward Fortes  . TOTAL KNEE ARTHROPLASTY Left 02/14/2014   Procedure: TOTAL KNEE ARTHROPLASTY;  Surgeon: Garald Balding, MD;  Location: [facility];  Service: Orthopedics;  Laterality: Left;    Social History   Socioeconomic History  . Marital status: Married    Spouse name: Not on file  . Number of children: 2  . Years of education: Not on file  . Highest education level: Not on file  Occupational History  . Occupation: Land: Theme park manager    Comment: works from home  Social Needs  . Financial resource strain: Not on file  . Food insecurity:    Worry: Not on file    Inability: Not on file  . Transportation needs:    Medical: Not on file    Non-medical: Not on file  Tobacco Use  . Smoking status: Former Smoker    Packs/day: 0.25    Years: 1.00    Pack years: 0.25    Types: Cigarettes    Last attempt to quit: 07/08/2010    Years since quitting: 7.8  . Smokeless tobacco: Never Used  Substance and Sexual Activity  . Alcohol use: Yes    Alcohol/week: 0.0 standard drinks    Comment: Rare  . Drug use: No  . Sexual activity: Not Currently    Comment: 1st intercourse- 14, partners- 25, married- 80 yrs   Lifestyle  . Physical activity:    Days per week: Not on file    Minutes per session: Not on file  . Stress: Not on file  Relationships  . Social connections:    Talks on phone: Not on file    Gets together: Not on file    Attends religious service: Not on file    Active member of club or organization: Not on file    Attends meetings of clubs or  organizations: Not on file    Relationship status: Not on file  Other Topics Concern  . Not on file  Social History Narrative   Lives with spouse; grown kids   Pt is also a Ship broker.   Regular exercise-yes    Family History  Problem Relation Age of Onset  . Pancreatic cancer Mother 22  . Hypertension Father   . Heart disease Father   . Diabetes Son  Review of Systems  Constitutional: Negative for chills and fever.  Respiratory: Positive for cough and wheezing. Negative for shortness of breath.   Cardiovascular: Negative for chest pain, palpitations and leg swelling.  Musculoskeletal: Positive for gait problem and myalgias.  Neurological: Positive for dizziness and light-headedness. Negative for numbness and headaches.       Objective:   Vitals:   05/24/18 1544  BP: 138/82  Pulse: 62  Resp: 16  Temp: 98.6 F (37 C)  SpO2: 96%   BP Readings from Last 3 Encounters:  05/24/18 138/82  05/18/18 136/88  11/20/17 130/86   Wt Readings from Last 3 Encounters:  05/24/18 225 lb 12.8 oz (102.4 kg)  11/20/17 223 lb (101.2 kg)  10/11/17 220 lb (99.8 kg)   Body mass index is 40 kg/m.   Physical Exam    Constitutional: Appears well-developed and well-nourished. No distress.  HENT:  Head: Normocephalic and atraumatic.  Neck: Neck supple. No tracheal deviation present. No thyromegaly present.  No cervical lymphadenopathy Cardiovascular: Normal rate, regular rhythm and normal heart sounds.   2/6 systolic murmur heard. No carotid bruit .  No edema Pulmonary/Chest: Effort normal and breath sounds normal. No respiratory distress. No has no wheezes. No rales.  Abdomen: soft, nt, nd MSK:  Muscles ? Tender with palpation Skin: Skin is warm and dry. Not diaphoretic.  Psychiatric: Normal mood and affect. Behavior is normal.   Diabetic Foot Exam - Simple   Simple Foot Form Diabetic Foot exam was performed with the following findings:  Yes   Visual Inspection No deformities,  no ulcerations, no other skin breakdown bilaterally:  Yes Sensation Testing Intact to touch and monofilament testing bilaterally:  Yes Pulse Check Posterior Tibialis and Dorsalis pulse intact bilaterally:  Yes Comments Onychomycosis right toenails >> left        Assessment & Plan:    See Problem List for Assessment and Plan of chronic medical problems.

## 2018-05-24 ENCOUNTER — Ambulatory Visit (INDEPENDENT_AMBULATORY_CARE_PROVIDER_SITE_OTHER): Payer: BLUE CROSS/BLUE SHIELD | Admitting: Internal Medicine

## 2018-05-24 ENCOUNTER — Encounter: Payer: Self-pay | Admitting: Internal Medicine

## 2018-05-24 ENCOUNTER — Other Ambulatory Visit (INDEPENDENT_AMBULATORY_CARE_PROVIDER_SITE_OTHER): Payer: BLUE CROSS/BLUE SHIELD

## 2018-05-24 VITALS — BP 138/82 | HR 62 | Temp 98.6°F | Resp 16 | Ht 63.0 in | Wt 225.8 lb

## 2018-05-24 DIAGNOSIS — M791 Myalgia, unspecified site: Secondary | ICD-10-CM

## 2018-05-24 DIAGNOSIS — E119 Type 2 diabetes mellitus without complications: Secondary | ICD-10-CM

## 2018-05-24 DIAGNOSIS — I10 Essential (primary) hypertension: Secondary | ICD-10-CM

## 2018-05-24 DIAGNOSIS — F32A Depression, unspecified: Secondary | ICD-10-CM

## 2018-05-24 DIAGNOSIS — E7800 Pure hypercholesterolemia, unspecified: Secondary | ICD-10-CM

## 2018-05-24 DIAGNOSIS — F320 Major depressive disorder, single episode, mild: Secondary | ICD-10-CM

## 2018-05-24 DIAGNOSIS — F419 Anxiety disorder, unspecified: Secondary | ICD-10-CM

## 2018-05-24 LAB — COMPREHENSIVE METABOLIC PANEL
ALT: 10 U/L (ref 0–35)
AST: 14 U/L (ref 0–37)
Albumin: 4.8 g/dL (ref 3.5–5.2)
Alkaline Phosphatase: 58 U/L (ref 39–117)
BUN: 20 mg/dL (ref 6–23)
CALCIUM: 10 mg/dL (ref 8.4–10.5)
CO2: 30 meq/L (ref 19–32)
CREATININE: 0.67 mg/dL (ref 0.40–1.20)
Chloride: 102 mEq/L (ref 96–112)
GFR: 95.33 mL/min (ref >60.00)
Glucose, Bld: 90 mg/dL (ref 70–99)
POTASSIUM: 4.5 meq/L (ref 3.5–5.1)
SODIUM: 141 meq/L (ref 135–145)
Total Bilirubin: 0.6 mg/dL (ref 0.2–1.2)
Total Protein: 7.4 g/dL (ref 6.0–8.3)

## 2018-05-24 LAB — CBC WITH DIFFERENTIAL/PLATELET
BASOS ABS: 0.1 10*3/uL (ref 0.0–0.1)
Basophils Relative: 1.5 % (ref 0.0–3.0)
EOS ABS: 0.2 10*3/uL (ref 0.0–0.7)
EOS PCT: 2.7 % (ref 0.0–5.0)
HCT: 45.8 % (ref 36.0–46.0)
Hemoglobin: 15 g/dL (ref 12.0–15.0)
LYMPHS PCT: 29.7 % (ref 12.0–46.0)
Lymphs Abs: 2.5 10*3/uL (ref 0.7–4.0)
MCHC: 32.7 g/dL (ref 30.0–36.0)
MCV: 87.1 fl (ref 78.0–100.0)
Monocytes Absolute: 0.5 10*3/uL (ref 0.1–1.0)
Monocytes Relative: 6.5 % (ref 3.0–12.0)
NEUTROS ABS: 4.9 10*3/uL (ref 1.4–7.7)
Neutrophils Relative %: 59.6 % (ref 43.0–77.0)
Platelets: 263 10*3/uL (ref 150.0–400.0)
RBC: 5.26 Mil/uL — AB (ref 3.87–5.11)
RDW: 14.4 % (ref 11.5–15.5)
WBC: 8.3 10*3/uL (ref 4.0–10.5)

## 2018-05-24 LAB — LIPID PANEL
CHOL/HDL RATIO: 6
Cholesterol: 250 mg/dL — ABNORMAL HIGH (ref 0–200)
HDL: 45.3 mg/dL (ref >39.00)
LDL CALC: 176 mg/dL — AB (ref 0–99)
NONHDL: 204.69
Triglycerides: 145 mg/dL (ref 0.0–149.0)
VLDL: 29 mg/dL (ref 0.0–40.0)

## 2018-05-24 LAB — C-REACTIVE PROTEIN: CRP: 0.2 mg/dL — ABNORMAL LOW (ref 0.5–20.0)

## 2018-05-24 LAB — TSH: TSH: 4.48 u[IU]/mL (ref 0.35–4.50)

## 2018-05-24 LAB — CK: Total CK: 86 U/L (ref 7–177)

## 2018-05-24 LAB — HEMOGLOBIN A1C: Hgb A1c MFr Bld: 6.2 % (ref 4.6–6.5)

## 2018-05-24 LAB — SEDIMENTATION RATE: Sed Rate: 24 mm/hr (ref 0–30)

## 2018-05-24 MED ORDER — METFORMIN HCL 500 MG PO TABS: ORAL_TABLET | ORAL | 1 refills | Status: DC

## 2018-05-24 NOTE — Assessment & Plan Note (Signed)
Check lipid panel  Continue daily statin Regular exercise and healthy diet encouraged  

## 2018-05-24 NOTE — Patient Instructions (Addendum)

## 2018-05-24 NOTE — Assessment & Plan Note (Signed)
Check a1c Continue current medication

## 2018-05-24 NOTE — Assessment & Plan Note (Signed)
Controlled, stable Continue current dose of medication  

## 2018-05-24 NOTE — Assessment & Plan Note (Signed)
BP well controlled Current regimen effective and well tolerated Continue current medications at current doses cmp  

## 2018-05-24 NOTE — Assessment & Plan Note (Signed)
Chronic - whole body ? Myositis - check ck, esr, crp May need to be referred depending on above results

## 2018-05-26 ENCOUNTER — Other Ambulatory Visit: Payer: Self-pay | Admitting: Internal Medicine

## 2018-05-26 MED ORDER — ATORVASTATIN CALCIUM 20 MG PO TABS: ORAL_TABLET | ORAL | 0 refills | Status: DC

## 2018-05-27 ENCOUNTER — Other Ambulatory Visit: Payer: Self-pay | Admitting: Internal Medicine

## 2018-05-27 DIAGNOSIS — M542 Cervicalgia: Secondary | ICD-10-CM

## 2018-05-27 DIAGNOSIS — M791 Myalgia, unspecified site: Secondary | ICD-10-CM

## 2018-05-27 DIAGNOSIS — R2689 Other abnormalities of gait and mobility: Secondary | ICD-10-CM

## 2018-06-01 ENCOUNTER — Encounter: Payer: Self-pay | Admitting: Psychology

## 2018-06-03 ENCOUNTER — Other Ambulatory Visit: Payer: Self-pay | Admitting: Internal Medicine

## 2018-06-21 ENCOUNTER — Other Ambulatory Visit: Payer: Self-pay

## 2018-06-21 ENCOUNTER — Other Ambulatory Visit (INDEPENDENT_AMBULATORY_CARE_PROVIDER_SITE_OTHER): Payer: BLUE CROSS/BLUE SHIELD

## 2018-06-21 ENCOUNTER — Encounter: Payer: Self-pay | Admitting: Neurology

## 2018-06-21 ENCOUNTER — Ambulatory Visit (INDEPENDENT_AMBULATORY_CARE_PROVIDER_SITE_OTHER): Payer: BLUE CROSS/BLUE SHIELD | Admitting: Neurology

## 2018-06-21 VITALS — BP 146/82 | HR 73 | Ht 63.0 in | Wt 226.0 lb

## 2018-06-21 DIAGNOSIS — M791 Myalgia, unspecified site: Secondary | ICD-10-CM

## 2018-06-21 DIAGNOSIS — M542 Cervicalgia: Secondary | ICD-10-CM

## 2018-06-21 NOTE — Progress Notes (Signed)
NEUROLOGY CONSULTATION NOTE  Katherine Lawrence MRN: [id number] DOB: 09-26-1955  Referring provider: Dr. Billey Gosling Primary care provider: Dr. Billey Gosling  Reason for consult:  Muscle pain, poor balance, neck pain  Dear Dr Quay Burow:  Thank you for your kind referral of Katherine Lawrence for consultation of the above symptoms. Although her history is well known to you, please allow me to reiterate it for the purpose of our medical record. She is alone in the office today. Records and images were personally reviewed where available.  HISTORY OF PRESENT ILLNESS: This is a pleasant 63 year old right-handed woman who I had previously seen in October 2016 for evaluation of memory changes and poor concentration. She had an MRI brain which was unremarkable. She presents today for evaluation of different symptoms of muscle pain, neck pain, and poor balance. She reports that the muscle pains started right after her hip replacement in 2012 or so. After she went home and started doing exercises, she started noticing pain in her arms. On review of records, she had been reporting severe muscle pains in her arms > legs since 2012, repeat CK levels have been normal. She describes pain as an ache that has progressively worsened over the years that she cannot lift her arms up or put anything away in overhead cabinets. She describes weakness and pain in her arms, shoulders, and neck. She denies back pain but has the pain in her legs, causing difficulties climbing stairs. She does have knee issues as well and uses a cane, with left knee replacement surgery scheduled at the end of this month. She does not take Advil for regular pain or when she can walk, taking it only when pain is really bad or  before she cleans the house and would definitely need it after cleaning, taking 5 tablets at one time 2-3 times a week. She denies any numbness or tingling in her extremities, but has noticed burning on the base of her left thumb,  making it difficult to move. She has muscle cramps in her legs, worse at night. She denies any family history of similar symptoms. No prior falls or accidents.   She denies any headaches, dysarthria/dysphagia, bowel/bladder dysfunction. Vision is occasionally blurred, no diplopia. She reports dizziness described as a sensation of unsteadiness like she will fall. She continues to note memory issues. Her husband teases her that she repeats herself. She worked for [employer] for 36 years and all of a sudden could not remember what to do. She occasionally gets lost driving, she has to think of how to get to familiar places. She missed a bill payment 2 months ago. She is pretty good remembering medications. Mood is good, she has noticed a difference when she forgets her citalopram.   Laboratory Data: Lab Results  Component Value Date   ESRSEDRATE 24 05/24/2018   Lab Results  Component Value Date   CRP 0.2 (L) 05/24/2018   Lab Results  Component Value Date   CKTOTAL 86 05/24/2018    PAST MEDICAL HISTORY: Past Medical History:  Diagnosis Date  . CIN I (cervical intraepithelial neoplasia I) 1996   cryo...  cone of cervix  . DEPRESSION   . Diabetes type 2, controlled ([place])   . Endometrial polyp 07/21/2011  . Heart murmur    age 63   . HYPERCHOLESTEROLEMIA   . HYPERTENSION   . Osteoarthritis    hips and knees  . Pneumonia 06/2003  . Postsurgical menopause  PAST SURGICAL HISTORY: Past Surgical History:  Procedure Laterality Date  . ABDOMINAL HYSTERECTOMY  08/22/2011  . CERVICAL CONE BIOPSY  1996  . CESAREAN SECTION     x1  . COLONOSCOPY W/ POLYPECTOMY  12/2008   x3  . DILATION AND CURETTAGE OF UTERUS    . TONSILLECTOMY    . TOTAL HIP ARTHROPLASTY Right 2008  . TOTAL HIP ARTHROPLASTY Left 05/20/2017   Procedure: LEFT TOTAL HIP ARTHROPLASTY ANTERIOR APPROACH;  Surgeon: Gaynelle Arabian, MD;  Location: [facility];  Service: Orthopedics;  Laterality: Left;  . TOTAL KNEE ARTHROPLASTY  Left 02/14/2014   DR Durward Fortes  . TOTAL KNEE ARTHROPLASTY Left 02/14/2014   Procedure: TOTAL KNEE ARTHROPLASTY;  Surgeon: Garald Balding, MD;  Location: [facility];  Service: Orthopedics;  Laterality: Left;    MEDICATIONS: Current Outpatient Medications on File Prior to Visit  Medication Sig Dispense Refill  . amLODipine (NORVASC) 5 MG tablet TAKE 1 TABLET(5 MG) BY MOUTH DAILY 90 tablet 3  . atorvastatin (LIPITOR) 20 MG tablet Take 1 tablet (20 mg total)  by mouth daily. 90 tablet 0  . blood glucose meter kit and supplies KIT Dispense based on patient and insurance preference. Use up to four times daily as directed. (FOR E11.9). 1 each 0  . citalopram (CELEXA) 40 MG tablet TAKE 1 TABLET(40 MG) BY MOUTH DAILY 90 tablet 1  . cromolyn (OPTICROM) 4 % ophthalmic solution INT 1 GTT IN OU QID  0  . latanoprost (XALATAN) 0.005 % ophthalmic solution INSTILL 1 DROP BOTH EYES AT BEDTIME  11  . losartan (COZAAR) 100 MG tablet TAKE 1 TABLET(100 MG) BY MOUTH DAILY 90 tablet 1  . metFORMIN (GLUCOPHAGE) 500 MG tablet TAKE 1 TABLET(500 MG) BY MOUTH TWICE DAILY WITH A MEAL 180 tablet 1   No current facility-administered medications on file prior to visit.     ALLERGIES: Allergies  Allergen Reactions  . Pollen Extract     Other reaction(s): Eye redness, Eye swelling    FAMILY HISTORY: Family History  Problem Relation Age of Onset  . Pancreatic cancer Mother 92  . Hypertension Father   . Heart disease Father   . Diabetes Son     SOCIAL HISTORY: Social History   Socioeconomic History  . Marital status: Married    Spouse name: Not on file  . Number of children: 2  . Years of education: Not on file  . Highest education level: Not on file  Occupational History  . Occupation: Land: Theme park manager    Comment: works from home  Social Needs  . Financial resource strain: Not on file  . Food insecurity:    Worry: Not on file    Inability: Not on file  . Transportation  needs:    Medical: Not on file    Non-medical: Not on file  Tobacco Use  . Smoking status: Former Smoker    Packs/day: 0.25    Years: 1.00    Pack years: 0.25    Types: Cigarettes    Last attempt to quit: 07/08/2010    Years since quitting: 7.9  . Smokeless tobacco: Never Used  Substance and Sexual Activity  . Alcohol use: Yes    Alcohol/week: 0.0 standard drinks    Comment: Rare  . Drug use: No  . Sexual activity: Not Currently    Comment: 1st intercourse- 14, partners- 67, married- 48 yrs   Lifestyle  . Physical activity:    Days per week: Not  on file    Minutes per session: Not on file  . Stress: Not on file  Relationships  . Social connections:    Talks on phone: Not on file    Gets together: Not on file    Attends religious service: Not on file    Active member of club or organization: Not on file    Attends meetings of clubs or organizations: Not on file    Relationship status: Not on file  . Intimate partner violence:    Fear of current or ex partner: Not on file    Emotionally abused: Not on file    Physically abused: Not on file    Forced sexual activity: Not on file  Other Topics Concern  . Not on file  Social History Narrative   Lives with spouse; grown kids   Pt is also a Ship broker.   Regular exercise-yes    REVIEW OF SYSTEMS: Constitutional: No fevers, chills, or sweats, no generalized fatigue, change in appetite Eyes: No visual changes, double vision, eye pain Ear, nose and throat: No hearing loss, ear pain, nasal congestion, sore throat Cardiovascular: No chest pain, palpitations Respiratory:  No shortness of breath at rest or with exertion, wheezes GastrointestinaI: No nausea, vomiting, diarrhea, abdominal pain, fecal incontinence Genitourinary:  No dysuria, urinary retention or frequency Musculoskeletal: +neck pain,no back pain Integumentary: No rash, pruritus, skin lesions Neurological: as above Psychiatric: No depression, insomnia,  anxiety Endocrine: No palpitations, fatigue, diaphoresis, mood swings, change in appetite, change in weight, increased thirst Hematologic/Lymphatic:  No anemia, purpura, petechiae. Allergic/Immunologic: no itchy/runny eyes, nasal congestion, recent allergic reactions, rashes  PHYSICAL EXAM: Vitals:   06/21/18 0913  BP: (!) 146/82  Pulse: 73  SpO2: 93%   General: No acute distress Head:  Normocephalic/atraumatic Eyes: Fundoscopic exam shows bilateral sharp discs, no vessel changes, exudates, or hemorrhages Neck: supple, no paraspinal tenderness, full range of motion Back: No paraspinal tenderness Heart: regular rate and rhythm Lungs: Clear to auscultation bilaterally. Vascular: No carotid bruits. Skin/Extremities: No rash, no edema Neurological Exam: Mental status: alert and oriented to person, place, and time, no dysarthria or aphasia, Fund of knowledge is appropriate.  Remote memory intact.  Attention and concentration are normal.    Able to name objects and repeat phrases.  Montreal Cognitive Assessment  06/21/2018 04/13/2015  Visuospatial/ Executive (0/5) 4 3  Naming (0/3) 3 3  Attention: Read list of digits (0/2) 2 2  Attention: Read list of letters (0/1) 1 1  Attention: Serial 7 subtraction starting at 100 (0/3) 3 3  Language: Repeat phrase (0/2) 2 2  Language : Fluency (0/1) 0 0  Abstraction (0/2) 2 2  Delayed Recall (0/5) 0 2  Orientation (0/6) 6 6  Total 23 24  Adjusted Score (based on education) - 24   Cranial nerves: CN I: not tested CN II: pupils equal, round and reactive to light, visual fields intact, fundi unremarkable. CN III, IV, VI:  full range of motion, no nystagmus, no ptosis CN V: facial sensation intact CN VII: upper and lower face symmetric CN VIII: hearing intact to finger rub CN IX, X: gag intact, uvula midline CN XI: sternocleidomastoid and trapezius muscles intact CN XII: tongue midline Bulk & Tone: normal, no fasciculations. Motor: 5/5  throughout except with some difficulty on left hip flexion reporting left knee pain/swelling, no pronator drift. Sensation: intact to light touch, cold, pin on both UE and LE, decreased vibration sense to ankles bilaterally. No extinction to double simultaneous  stimulation.  Romberg test negative Deep Tendon Reflexes: +2 throughout except +1 bilateral ankle jerks, no ankle clonus Plantar responses: downgoing bilaterally Cerebellar: no incoordination on finger to nose testing Gait: antalgic gait due to left knee pain Tremor: none  IMPRESSION: This is a pleasant 63 year old right-handed woman with a history of hypertension, hyperlipidemia, diabetes, depression, presenting for evaluation of myalgias, neck pain. She also continues to report memory issues. She reports pain and weakness in extremities, no clear weakness on exam today except due to left knee pain. MOCA score 23/30 (24/30 in 2016). Etiology of myalgias unclear, she has been complaining of severe muscle pains since at least 2012. CK normal. Check ANA, ANCA, RF. EMG/NCV of the right UE and LE will be ordered to assess for myopathy. MRI cervical spine without contrast will be done to assess for underlying structural abnormality. We discussed different causes of memory changes, her B12 level was low normal, start daily B12 supplement. We may consider Neurocognitive testing in the future. Follow-up after tests, she knows to call for any changes.   Thank you for allowing me to participate in the care of this patient. Please do not hesitate to call for any questions or concerns.   Ellouise Newer, M.D.  CC: Dr. Quay Burow

## 2018-06-21 NOTE — Patient Instructions (Addendum)
1. Bloodwork for ANA, ANCA, RF  Your provider requests that you have LABS drawn today.  We share a lab with [facility] - they are located in [address] (second floor) of this building.  Once you get there, please have a seat and the phlebotomist will call your name.  If you have waited more than 15 minutes, please advise the front desk  2. Schedule EMG/NCV with Dr. Posey Pronto 3. Schedule MRI cervical spine without contrast  We have sent a referral to [facility] for your MRI and they will call you directly to schedule your appt. They are located at [place]. If you need to contact them directly please call [phone].  4. Start daily B12 1050mcg supplement 5. Follow-up after tests, call for any changes   RECOMMENDATIONS FOR ALL PATIENTS WITH MEMORY PROBLEMS: 1. Continue to exercise (Recommend 30 minutes of walking everyday, or 3 hours every week) 2. Increase social interactions - continue going to [place] and enjoy social gatherings with friends and family 3. Eat healthy, avoid fried foods and eat more fruits and vegetables 4. Maintain adequate blood pressure, blood sugar, and blood cholesterol level. Reducing the risk of stroke and cardiovascular disease also helps promoting better memory. 5. Avoid stressful situations. Live a simple life and avoid aggravations. Organize your time and prepare for the next day in anticipation. 6. Sleep well, avoid any interruptions of sleep and avoid any distractions in the bedroom that may interfere with adequate sleep quality 7. Avoid sugar, avoid sweets as there is a strong link between excessive sugar intake, diabetes, and cognitive impairment We discussed the Mediterranean diet, which has been shown to help patients reduce the risk of progressive memory disorders and reduces cardiovascular risk. This includes eating fish, eat fruits and green leafy vegetables, nuts like almonds and hazelnuts, walnuts, and also use olive oil. Avoid  fast foods and fried foods as much as possible. Avoid sweets and sugar as sugar use has been linked to worsening of memory function.

## 2018-06-22 LAB — ANA: ANA: NEGATIVE

## 2018-06-22 LAB — ANCA SCREEN W REFLEX TITER: ANCA SCREEN: NEGATIVE

## 2018-06-22 LAB — RHEUMATOID FACTOR

## 2018-07-03 ENCOUNTER — Other Ambulatory Visit: Payer: Self-pay | Admitting: Internal Medicine

## 2018-07-05 NOTE — Patient Instructions (Addendum)
Katherine Lawrence  07/05/2018   Your procedure is scheduled on:Wednesday 07/14/2018   Report to [facility] Main  Entrance              Report to admitting at  1000 AM    Call this number if you have problems the morning of surgery [phone]    How to Manage Your Diabetes Before and After Surgery  Why is it important to control my blood sugar before and after surgery? . Improving blood sugar levels before and after surgery helps healing and can limit problems. . A way of improving blood sugar control is eating a healthy diet by: o  Eating less sugar and carbohydrates o  Increasing activity/exercise o  Talking with your doctor about reaching your blood sugar goals . High blood sugars (greater than 180 mg/dL) can raise your risk of infections and slow your recovery, so you will need to focus on controlling your diabetes during the weeks before surgery. . Make sure that the doctor who takes care of your diabetes knows about your planned surgery including the date and location.  How do I manage my blood sugar before surgery? . Check your blood sugar at least 4 times a day, starting 2 days before surgery, to make sure that the level is not too high or low. o Check your blood sugar the morning of your surgery when you wake up and every 2 hours until you get to the Short Stay unit. . If your blood sugar is less than 70 mg/dL, you will need to treat for low blood sugar: o Do not take insulin. o Treat a low blood sugar (less than 70 mg/dL) with  cup of clear juice (cranberry or apple), 4 glucose tablets, OR glucose gel. o Recheck blood sugar in 15 minutes after treatment (to make sure it is greater than 70 mg/dL). If your blood sugar is not greater than 70 mg/dL on recheck, call [phone] for further instructions. . Report your blood sugar to the short stay nurse when you get to Short Stay.  . If you are admitted to the hospital after surgery: o Your blood sugar  will be checked by the staff and you will probably be given insulin after surgery (instead of oral diabetes medicines) to make sure you have good blood sugar levels. o The goal for blood sugar control after surgery is 80-180 mg/dL.   WHAT DO I DO ABOUT MY DIABETES MEDICATION?        The day before surgery, Take Metformin as usual!  . Do not take oral diabetes medicines (pills) the morning of surgery.     Remember: Do not eat food or drink liquids :After Midnight.              BRUSH YOUR TEETH MORNING OF SURGERY AND RINSE YOUR MOUTH OUT, NO CHEWING GUM CANDY OR MINTS.     Take these medicines the morning of surgery with A SIP OF WATER: Amlodipine (Norvasc), Citalopram (Celexa)               DO NOT TAKE ANY DIABETIC MEDICATIONS DAY OF YOUR SURGERY!                               You may not have any metal on your body including hair pins and  piercings  Do not wear jewelry, make-up, lotions, powders or perfumes, deodorant             Do not wear nail polish.  Do not shave  48 hours prior to surgery.           Do not bring valuables to the hospital. [place].  Contacts, dentures or bridgework may not be worn into surgery.  Leave suitcase in the car. After surgery it may be brought to your room.                  Please read over the following fact sheets you were given: _____________________________________________________________________             [facility] - Preparing for Surgery Before surgery, you can play an important role.  Because skin is not sterile, your skin needs to be as free of germs as possible.  You can reduce the number of germs on your skin by washing with CHG (chlorahexidine gluconate) soap before surgery.  CHG is an antiseptic cleaner which kills germs and bonds with the skin to continue killing germs even after washing. Please DO NOT use if you have an allergy to CHG or antibacterial soaps.  If  your skin becomes reddened/irritated stop using the CHG and inform your nurse when you arrive at Short Stay. Do not shave (including legs and underarms) for at least 48 hours prior to the first CHG shower.  You may shave your face/neck. Please follow these instructions carefully:  1.  Shower with CHG Soap the night before surgery and the  morning of Surgery.  2.  If you choose to wash your hair, wash your hair first as usual with your  normal  shampoo.  3.  After you shampoo, rinse your hair and body thoroughly to remove the  shampoo.                           4.  Use CHG as you would any other liquid soap.  You can apply chg directly  to the skin and wash                       Gently with a scrungie or clean washcloth.  5.  Apply the CHG Soap to your body ONLY FROM THE NECK DOWN.   Do not use on face/ open                           Wound or open sores. Avoid contact with eyes, ears mouth and genitals (private parts).                       Wash face,  Genitals (private parts) with your normal soap.             6.  Wash thoroughly, paying special attention to the area where your surgery  will be performed.  7.  Thoroughly rinse your body with warm water from the neck down.  8.  DO NOT shower/wash with your normal soap after using and rinsing off  the CHG Soap.                9.  Pat yourself dry with a clean towel.  10.  Wear clean pajamas.            11.  Place clean sheets on your bed the night of your first shower and do not  sleep with pets. Day of Surgery : Do not apply any lotions/deodorants the morning of surgery.  Please wear clean clothes to the hospital/surgery center.  FAILURE TO FOLLOW THESE INSTRUCTIONS MAY RESULT IN THE CANCELLATION OF YOUR SURGERY PATIENT SIGNATURE_________________________________  NURSE SIGNATURE__________________________________  ________________________________________________________________________   Katherine Lawrence  An incentive  spirometer is a tool that can help keep your lungs clear and active. This tool measures how well you are filling your lungs with each breath. Taking long deep breaths may help reverse or decrease the chance of developing breathing (pulmonary) problems (especially infection) following:  A long period of time when you are unable to move or be active. BEFORE THE PROCEDURE   If the spirometer includes an indicator to show your best effort, your nurse or respiratory therapist will set it to a desired goal.  If possible, sit up straight or lean slightly forward. Try not to slouch.  Hold the incentive spirometer in an upright position. INSTRUCTIONS FOR USE  1. Sit on the edge of your bed if possible, or sit up as far as you can in bed or on a chair. 2. Hold the incentive spirometer in an upright position. 3. Breathe out normally. 4. Place the mouthpiece in your mouth and seal your lips tightly around it. 5. Breathe in slowly and as deeply as possible, raising the piston or the ball toward the top of the column. 6. Hold your breath for 3-5 seconds or for as long as possible. Allow the piston or ball to fall to the bottom of the column. 7. Remove the mouthpiece from your mouth and breathe out normally. 8. Rest for a few seconds and repeat Steps 1 through 7 at least 10 times every 1-2 hours when you are awake. Take your time and take a few normal breaths between deep breaths. 9. The spirometer may include an indicator to show your best effort. Use the indicator as a goal to work toward during each repetition. 10. After each set of 10 deep breaths, practice coughing to be sure your lungs are clear. If you have an incision (the cut made at the time of surgery), support your incision when coughing by placing a pillow or rolled up towels firmly against it. Once you are able to get out of bed, walk around indoors and cough well. You may stop using the incentive spirometer when instructed by your caregiver.   RISKS AND COMPLICATIONS  Take your time so you do not get dizzy or light-headed.  If you are in pain, you may need to take or ask for pain medication before doing incentive spirometry. It is harder to take a deep breath if you are having pain. AFTER USE  Rest and breathe slowly and easily.  It can be helpful to keep track of a log of your progress. Your caregiver can provide you with a simple table to help with this. If you are using the spirometer at home, follow these instructions: [employer] IF:   You are having difficultly using the spirometer.  You have trouble using the spirometer as often as instructed.  Your pain medication is not giving enough relief while using the spirometer.  You develop fever of 100.5 F (38.1 C) or higher. SEEK IMMEDIATE MEDICAL CARE IF:   You cough up bloody  sputum that had not been present before.  You develop fever of 102 F (38.9 C) or greater.  You develop worsening pain at or near the incision site. MAKE SURE YOU:   Understand these instructions.  Will watch your condition.  Will get help right away if you are not doing well or get worse. Document Released: 10/13/2006 Document Revised: 08/25/2011 Document Reviewed: 12/14/2006 Exit[employer] Patient Information 2014 [employer], [place].   ________________________________________________________________________  WHAT IS A BLOOD TRANSFUSION? Blood Transfusion Information  A transfusion is the replacement of blood or some of its parts. Blood is made up of multiple cells which provide different functions.  Red blood cells carry oxygen and are used for blood loss replacement.  White blood cells fight against infection.  Platelets control bleeding.  Plasma helps clot blood.  Other blood products are available for specialized needs, such as hemophilia or other clotting disorders. BEFORE THE TRANSFUSION  Who gives blood for transfusions?   Healthy volunteers who are fully evaluated  to make sure their blood is safe. This is blood bank blood. Transfusion therapy is the safest it has ever been in the practice of medicine. Before blood is taken from a donor, a complete history is taken to make sure that person has no history of diseases nor engages in risky social behavior (examples are intravenous drug use or sexual activity with multiple partners). The donor's travel history is screened to minimize risk of transmitting infections, such as malaria. The donated blood is tested for signs of infectious diseases, such as HIV and hepatitis. The blood is then tested to be sure it is compatible with you in order to minimize the chance of a transfusion reaction. If you or a relative donates blood, this is often done in anticipation of surgery and is not appropriate for emergency situations. It takes many days to process the donated blood. RISKS AND COMPLICATIONS Although transfusion therapy is very safe and saves many lives, the main dangers of transfusion include:   Getting an infectious disease.  Developing a transfusion reaction. This is an allergic reaction to something in the blood you were given. Every precaution is taken to prevent this. The decision to have a blood transfusion has been considered carefully by your caregiver before blood is given. Blood is not given unless the benefits outweigh the risks. AFTER THE TRANSFUSION  Right after receiving a blood transfusion, you will usually feel much better and more energetic. This is especially true if your red blood cells have gotten low (anemic). The transfusion raises the level of the red blood cells which carry oxygen, and this usually causes an energy increase.  The nurse administering the transfusion will monitor you carefully for complications. HOME CARE INSTRUCTIONS  No special instructions are needed after a transfusion. You may find your energy is better. Speak with your caregiver about any limitations on activity for  underlying diseases you may have. SEEK MEDICAL CARE IF:   Your condition is not improving after your transfusion.  You develop redness or irritation at the intravenous (IV) site. SEEK IMMEDIATE MEDICAL CARE IF:  Any of the following symptoms occur over the next 12 hours:  Shaking chills.  You have a temperature by mouth above 102 F (38.9 C), not controlled by medicine.  Chest, back, or muscle pain.  People around you feel you are not acting correctly or are confused.  Shortness of breath or difficulty breathing.  Dizziness and fainting.  You get a rash or develop hives.  You have a  decrease in urine output.  Your urine turns a dark color or changes to pink, red, or brown. Any of the following symptoms occur over the next 10 days:  You have a temperature by mouth above 102 F (38.9 C), not controlled by medicine.  Shortness of breath.  Weakness after normal activity.  The white part of the eye turns yellow (jaundice).  You have a decrease in the amount of urine or are urinating less often.  Your urine turns a dark color or changes to pink, red, or brown. Document Released: 05/30/2000 Document Revised: 08/25/2011 Document Reviewed: 01/17/2008 [facility] Patient Information 2014 [place], [place].  _______________________________________________________________________

## 2018-07-05 NOTE — Progress Notes (Signed)
05/24/2018- Medical Clearance from Dr. Billey Gosling on chart and office visit note and lab results from -Sedimentation rate, TSH, Lipid panel, HgA1C, CBC w/diff., CK( Creatine Kinase), C-reactive protein.  05/15/2017- on chart EKG from Dr. Billey Gosling

## 2018-07-06 ENCOUNTER — Encounter: Payer: Self-pay | Admitting: Psychology

## 2018-07-06 ENCOUNTER — Ambulatory Visit (INDEPENDENT_AMBULATORY_CARE_PROVIDER_SITE_OTHER): Payer: BLUE CROSS/BLUE SHIELD | Admitting: Neurology

## 2018-07-06 DIAGNOSIS — M791 Myalgia, unspecified site: Secondary | ICD-10-CM | POA: Diagnosis not present

## 2018-07-06 DIAGNOSIS — M542 Cervicalgia: Secondary | ICD-10-CM

## 2018-07-06 NOTE — Procedures (Signed)
[facility]  [address]  [place], [place] [address] Tel: [phone] Fax:  [phone] Test Date:  07/06/2018  Patient: Katherine Lawrence DOB: [birth year] Physician: Narda Amber, DO  Sex: Female Height: 5\' 3"  Ref Phys: Ellouise Newer, MD  ID#: [id number] Temp: 32.0C Technician:    Patient Complaints: This is a 63 year old female referred for evaluation of generalized myalgias.  NCV & EMG Findings: Electrodiagnostic testing of the right upper and lower extremity shows:  1. All sensory responses including the right median, ulnar, sural, and superficial peroneal nerves are within normal limits. 2. All motor responses including the right median, ulnar, peroneal, and tibial nerves are within normal limits. 3. Right tibial H reflex studies within normal limits. 4. There is no evidence of active or chronic motor axonal loss changes affecting any of the tested muscles.  Motor unit configuration and recruitment pattern is within normal limits.  Impression: This is a normal study of the right upper and lower extremities.  In particular, there is no evidence of a diffuse myopathy, sensorimotor polyneuropathy, or cervical/lumbosacral radiculopathy.   ___________________________ Narda Amber, DO    Nerve Conduction Studies Anti Sensory Summary Table   Site NR Peak (ms) Norm Peak (ms) P-T Amp (V) Norm P-T Amp  Right Median Anti Sensory (2nd Digit)  32C  Wrist    3.2 <3.8 25.6 >10  Right Sup Peroneal Anti Sensory (Ant Lat Mall)  32C  12 cm    2.9 <4.6 21.6 >3  Right Sural Anti Sensory (Lat Mall)  32C  Calf    3.3 <4.6 20.0 >3  Right Ulnar Anti Sensory (5th Digit)  32C  Wrist    2.5 <3.2 31.4 >5   Motor Summary Table   Site NR Onset (ms) Norm Onset (ms) O-P Amp (mV) Norm O-P Amp Site1 Site2 Delta-0 (ms) Dist (cm) Vel (m/s) Norm Vel (m/s)  Right Median Motor (Abd Poll Brev)  32C  Wrist    3.0 <4.0 7.0 >5 Elbow Wrist 4.6 27.0 59 >50  Elbow    7.6  6.9           Right Peroneal Motor (Ext Dig Brev)  32C  Ankle    3.2 <6.0 7.2 >2.5 B Fib Ankle 7.7 37.0 48 >40  B Fib    10.9  6.6  Poplt B Fib 1.7 8.0 47 >40  Poplt    12.6  6.4         Right Peroneal TA Motor (Tib Ant)  32C  Fib Head    2.5 <4.5 6.6 >3 Poplit Fib Head 1.1 7.0 64 >40  Poplit    3.6  6.6         Right Tibial Motor (Abd Hall Brev)  32C  Ankle    3.1 <6.0 9.3 >4 Knee Ankle 7.7 38.0 49 >40  Knee    10.8  7.2         Right Ulnar Motor (Abd Dig Minimi)  32C  Wrist    2.0 <3.1 7.7 >7 B Elbow Wrist 3.5 23.0 66 >50  B Elbow    5.5  7.4  A Elbow B Elbow 1.6 10.0 63 >50  A Elbow    7.1  7.2          H Reflex Studies   NR H-Lat (ms) Lat Norm (ms) L-R H-Lat (ms)  Right Tibial (Gastroc)  32C     31.97 <35    EMG   Side Muscle Ins Act Fibs  Psw Fasc Number Recrt Dur Dur. Amp Amp. Poly Poly. Comment  Right 1stDorInt Nml Nml Nml Nml Nml Nml Nml Nml Nml Nml Nml Nml N/A  Right PronatorTeres Nml Nml Nml Nml Nml Nml Nml Nml Nml Nml Nml Nml N/A  Right Biceps Nml Nml Nml Nml Nml Nml Nml Nml Nml Nml Nml Nml N/A  Right Triceps Nml Nml Nml Nml Nml Nml Nml Nml Nml Nml Nml Nml N/A  Right Deltoid Nml Nml Nml Nml Nml Nml Nml Nml Nml Nml Nml Nml N/A  Right Cervical Parasp Low Nml Nml Nml Nml Nml Nml Nml Nml Nml Nml Nml Nml N/A  Right AntTibialis Nml Nml Nml Nml Nml Nml Nml Nml Nml Nml Nml Nml N/A  Right Gastroc Nml Nml Nml Nml Nml Nml Nml Nml Nml Nml Nml Nml N/A  Right Flex Dig Long Nml Nml Nml Nml Nml Nml Nml Nml Nml Nml Nml Nml N/A  Right RectFemoris Nml Nml Nml Nml Nml Nml Nml Nml Nml Nml Nml Nml N/A  Right GluteusMed Nml Nml Nml Nml Nml Nml Nml Nml Nml Nml Nml Nml N/A      Waveforms:

## 2018-07-07 NOTE — H&P (Signed)
TOTAL KNEE REVISION ADMISSION H&P  Patient is being admitted for left revision total knee arthroplasty.  Subjective:  Chief Complaint:left knee pain.  HPI: Katherine Lawrence is a 63 year old female who presents for pre-operative visit in preparation for their left total knee arthroplasty revision, which is scheduled on 07/14/2018 with Dr. Wynelle Link at [facility]. The patient has had symptoms in the left knee including pain and stiffness which has impacted their quality of life and ability to do activities of daily living. 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 The patient has had previous arthroplasty on the left knee. The patient denies an active infection.  Patient Active Problem List   Diagnosis Date Noted  . Anxiety 11/20/2017  . Glaucoma 07/14/2017  . OA (osteoarthritis) of hip 05/20/2017  . Osteoarthritis of left hip 05/10/2017  . Memory difficulties 04/13/2015  . Type 2 diabetes mellitus without complication, without long-term current use of insulin ([place]) 04/13/2015  . Obese 03/21/2015  . 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 Osteoarthritis of left knee   . Concentration deficit   . 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 Ovarian cyst 07/21/2011  . Mild depression ([facility]) 10/19/2008  . HYPERCHOLESTEROLEMIA 04/03/2008  . Essential hypertension 04/03/2008  . Myalgia 04/03/2008   Past Medical History:  Diagnosis Date  . CIN I (cervical intraepithelial neoplasia I) 1996   cryo...  cone of cervix  . DEPRESSION   . Diabetes type 2, controlled ([place])   . Endometrial polyp 07/21/2011  . Heart murmur    age 56   . HYPERCHOLESTEROLEMIA   . HYPERTENSION   . Osteoarthritis    hips and knees  . Pneumonia 06/2003  . Postsurgical menopause     Past Surgical History:  Procedure Laterality Date  . ABDOMINAL HYSTERECTOMY   08/22/2011  . CERVICAL CONE BIOPSY  1996  . CESAREAN SECTION     x1  . COLONOSCOPY W/ POLYPECTOMY  12/2008   x3  . DILATION AND CURETTAGE OF UTERUS    . TONSILLECTOMY    . TOTAL HIP ARTHROPLASTY Right 2008  . TOTAL HIP ARTHROPLASTY Left 05/20/2017   Procedure: LEFT TOTAL HIP ARTHROPLASTY ANTERIOR APPROACH;  Surgeon: Gaynelle Arabian, MD;  Location: [facility];  Service: Orthopedics;  Laterality: Left;  . TOTAL KNEE ARTHROPLASTY Left 02/14/2014   DR Durward Fortes  . TOTAL KNEE ARTHROPLASTY Left 02/14/2014   Procedure: TOTAL KNEE ARTHROPLASTY;  Surgeon: Garald Balding, MD;  Location: [facility];  Service: Orthopedics;  Laterality: Left;    No current facility-administered medications for this encounter.    Current Outpatient Medications  Medication Sig Dispense Refill Last Dose  . amLODipine (NORVASC) 5 MG tablet TAKE 1 TABLET(5 MG) BY MOUTH DAILY (Patient taking differently: Take 5 mg by mouth daily. ) 90 tablet 3 Taking  . aspirin EC 81 MG tablet Take 81 mg by mouth at bedtime.     Marland Kitchen atorvastatin (LIPITOR) 20 MG tablet Take 1 tablet (20 mg total)  by mouth daily. 90 tablet 0 Taking  . citalopram (CELEXA) 40 MG tablet TAKE 1 TABLET(40 MG) BY MOUTH DAILY (Patient taking differently: Take 40 mg by mouth daily. ) 90 tablet 1 Taking  . ibuprofen (ADVIL,MOTRIN) 200 MG tablet Take 800 mg by mouth every 8 (eight) hours as needed (pain).     Marland Kitchen latanoprost (XALATAN) 0.005 % ophthalmic solution Place 1 drop  into both eyes at bedtime.   11 Taking  . metFORMIN (GLUCOPHAGE) 500 MG tablet TAKE 1 TABLET(500 MG) BY MOUTH TWICE DAILY WITH A MEAL 180 tablet 1 Taking  . vitamin B-12 (CYANOCOBALAMIN) 100 MCG tablet Take 100 mcg by mouth daily.     . blood glucose meter kit and supplies KIT Dispense based on patient and insurance preference. Use up to four times daily as directed. (FOR E11.9). 1 each 0 Taking  . losartan (COZAAR) 100 MG tablet TAKE 1 TABLET(100 MG) BY MOUTH DAILY 90 tablet 1    Allergies  Allergen  Reactions  . Pollen Extract     Other reaction(s): Eye redness, Eye swelling    Social History   Tobacco Use  . Smoking status: Former Smoker    Packs/day: 0.25    Years: 1.00    Pack years: 0.25    Types: Cigarettes    Last attempt to quit: 07/08/2010    Years since quitting: 8.0  . Smokeless tobacco: Never Used  Substance Use Topics  . Alcohol use: Yes    Alcohol/week: 0.0 standard drinks    Comment: Rare    Family History  Problem Relation Age of Onset  . Pancreatic cancer Mother 70  . Hypertension Father   . Heart disease Father   . Diabetes Son       Review of Systems  Constitutional: Negative for chills and fever.  HENT: Negative for congestion, sore throat and tinnitus.   Eyes: Negative for double vision, photophobia and pain.  Respiratory: Negative for cough, shortness of breath and wheezing.   Cardiovascular: Negative for chest pain, palpitations and orthopnea.  Gastrointestinal: Negative for heartburn, nausea and vomiting.  Genitourinary: Negative for dysuria, frequency and urgency.  Musculoskeletal: Positive for joint pain.  Neurological: Negative for dizziness, weakness and headaches.     Objective:  Physical Exam  Well nourished and well developed.  General: Alert and oriented x3, cooperative and pleasant, no acute distress.  Head: normocephalic, atraumatic, neck supple.  Eyes: EOMI.  Respiratory: breath sounds clear in all fields, no wheezing, rales, or rhonchi. Cardiovascular: Regular rate and rhythm, no murmurs, gallops or rubs.  Abdomen: non-tender to palpation and soft, normoactive bowel sounds. Musculoskeletal: Left knee exam: No effusion. No warmth. Range of motion: 35 - 70 degrees.Knee is stable. Calves soft and nontender. Motor function intact in LE. Strength 5/5 LE bilaterally. Neuro: Distal pulses 2+. Sensation to light touch intact in LE.  Vital signs in last 24 hours: Blood pressure: 128/84 mmHg Pulse: 64 bpm  Labs:  Estimated body  mass index is 40.03 kg/m as calculated from the following:   Height as of 06/21/18: '5\' 3"'$  (1.6 m).   Weight as of 06/21/18: 102.5 kg.  Imaging Review Bone scan shows increased uptake in phase 3. Phase 1 has minimal uptake. This is a nonspecific finding. Plain films do suggest there might be loosening around the femoral and patellar components.  Preoperative templating of the joint replacement has been completed, documented, and submitted to the Operating Room personnel in order to optimize intra-operative equipment management.   Assessment/Plan:  End stage arthritis, left knee(s) with failed previous arthroplasty.   The patient history, physical examination, clinical judgment of the provider and imaging studies are consistent with end stage degenerative joint disease of the left knee(s), previous total knee arthroplasty. Revision total knee arthroplasty is deemed medically necessary. The treatment options including medical management, injection therapy, arthroscopy and revision arthroplasty were discussed at length. The risks and  benefits of revision total knee arthroplasty were presented and reviewed. The risks due to aseptic loosening, infection, stiffness, patella tracking problems, thromboembolic complications and other imponderables were discussed. The patient acknowledged the explanation, agreed to proceed with the plan and consent was signed. Patient is being admitted for inpatient treatment for surgery, pain control, PT, OT, prophylactic antibiotics, VTE prophylaxis, progressive ambulation and ADL's and discharge planning.The patient is planning to be discharged home.   Therapy Plans: outpatient therapy at [facility] Disposition: Home with son Planned DVT Prophylaxis: Aspirin 325 mg BID DME needed: None PCP: Celso Amy, MD TXA: IV Allergies: NKDA Anesthesia Concerns: Sleep apnea BMI: 38.1 Last HgbA1c: Unsure. Will add to preoperative labs.  - Patient was instructed on what  medications to stop prior to surgery. - Follow-up visit in 2 weeks with Dr. Wynelle Link - Begin physical therapy following surgery - Pre-operative lab work as pre-surgical testing - Prescriptions will be provided in hospital at time of discharge  Theresa Duty, PA-C Orthopedic Surgery [facility] [place]

## 2018-07-08 ENCOUNTER — Other Ambulatory Visit: Payer: Self-pay

## 2018-07-08 ENCOUNTER — Encounter (HOSPITAL_COMMUNITY): Payer: Self-pay

## 2018-07-08 ENCOUNTER — Encounter (HOSPITAL_COMMUNITY)
Admission: RE | Admit: 2018-07-08 | Discharge: 2018-07-08 | Disposition: A | Discharge status: Home / Self Care | Payer: BLUE CROSS/BLUE SHIELD | Source: Ambulatory Visit | Attending: Orthopedic Surgery | Admitting: Orthopedic Surgery

## 2018-07-08 DIAGNOSIS — E119 Type 2 diabetes mellitus without complications: Secondary | ICD-10-CM | POA: Diagnosis not present

## 2018-07-08 DIAGNOSIS — Z01818 Encounter for other preprocedural examination: Secondary | ICD-10-CM | POA: Diagnosis not present

## 2018-07-08 DIAGNOSIS — I10 Essential (primary) hypertension: Secondary | ICD-10-CM | POA: Insufficient documentation

## 2018-07-08 DIAGNOSIS — R9431 Abnormal electrocardiogram [ECG] [EKG]: Secondary | ICD-10-CM | POA: Insufficient documentation

## 2018-07-08 DIAGNOSIS — T84093A Other mechanical complication of internal left knee prosthesis, initial encounter: Secondary | ICD-10-CM | POA: Diagnosis not present

## 2018-07-08 LAB — SURGICAL PCR SCREEN
MRSA, PCR: NEGATIVE
Staphylococcus aureus: NEGATIVE

## 2018-07-08 LAB — CBC
HCT: 48 % — ABNORMAL HIGH (ref 36.0–46.0)
Hemoglobin: 14.7 g/dL (ref 12.0–15.0)
MCH: 28.5 pg (ref 26.0–34.0)
MCHC: 30.6 g/dL (ref 30.0–36.0)
MCV: 93 fL (ref 80.0–100.0)
NRBC: 0 % (ref 0.0–0.2)
Platelets: 229 10*3/uL (ref 150–400)
RBC: 5.16 MIL/uL — ABNORMAL HIGH (ref 3.87–5.11)
RDW: 13.3 % (ref 11.5–15.5)
WBC: 7.3 10*3/uL (ref 4.0–10.5)

## 2018-07-08 LAB — COMPREHENSIVE METABOLIC PANEL
ALT: 13 U/L (ref 0–44)
AST: 21 U/L (ref 15–41)
Albumin: 4.6 g/dL (ref 3.5–5.0)
Alkaline Phosphatase: 47 U/L (ref 38–126)
Anion gap: 9 (ref 5–15)
BUN: 17 mg/dL (ref 6–20)
CO2: 26 mmol/L (ref 22–32)
Calcium: 9.5 mg/dL (ref 8.9–10.3)
Chloride: 106 mmol/L (ref 98–111)
Creatinine, Ser: 0.75 mg/dL (ref 0.44–1.00)
GFR calc Af Amer: >60 mL/min (ref >60)
GFR calc non Af Amer: >60 mL/min (ref >60)
GLUCOSE: 97 mg/dL (ref 70–99)
Potassium: 4.7 mmol/L (ref 3.5–5.1)
Sodium: 141 mmol/L (ref 135–145)
Total Bilirubin: 1.2 mg/dL (ref 0.3–1.2)
Total Protein: 7.2 g/dL (ref 6.5–8.1)

## 2018-07-08 LAB — GLUCOSE, CAPILLARY: Glucose-Capillary: 124 mg/dL — ABNORMAL HIGH (ref 70–99)

## 2018-07-08 LAB — PROTIME-INR
INR: 1.03
Prothrombin Time: 13.4 seconds (ref 11.4–15.2)

## 2018-07-08 LAB — APTT: aPTT: 25 seconds (ref 24–36)

## 2018-07-08 NOTE — Progress Notes (Signed)
   07/08/18 0908  OBSTRUCTIVE SLEEP APNEA  Have you ever been diagnosed with sleep apnea through a sleep study? No  Do you snore loudly (loud enough to be heard through closed doors)?  1  Do you often feel tired, fatigued, or sleepy during the daytime (such as falling asleep during driving or talking to someone)? 1  Has anyone observed you stop breathing during your sleep? 1  Do you have, or are you being treated for high blood pressure? 1  BMI more than 35 kg/m2? 1  Age > 50 (1-yes) 1  Neck circumference greater than:Female 16 inches or larger, Female 17inches or larger? 1  Female Gender (Yes=1) 0  Obstructive Sleep Apnea Score 7  Score 5 or greater  Results sent to PCP

## 2018-07-13 MED ORDER — BUPIVACAINE LIPOSOME 1.3 % IJ SUSP
20.0000 mL | INTRAMUSCULAR | Status: DC
  Filled 2018-07-13: qty 20

## 2018-07-14 ENCOUNTER — Encounter: Payer: 59 | Admitting: Obstetrics & Gynecology

## 2018-07-14 ENCOUNTER — Other Ambulatory Visit: Payer: Self-pay

## 2018-07-14 ENCOUNTER — Encounter (HOSPITAL_COMMUNITY): Payer: Self-pay | Admitting: *Deleted

## 2018-07-14 ENCOUNTER — Inpatient Hospital Stay (HOSPITAL_COMMUNITY): Payer: BLUE CROSS/BLUE SHIELD | Admitting: Anesthesiology

## 2018-07-14 ENCOUNTER — Inpatient Hospital Stay (HOSPITAL_COMMUNITY)
Admission: RE | Admit: 2018-07-14 | Discharge: 2018-07-16 | DRG: 468 | Disposition: A | Discharge status: Home / Self Care | Payer: BLUE CROSS/BLUE SHIELD | Attending: Orthopedic Surgery | Admitting: Orthopedic Surgery

## 2018-07-14 ENCOUNTER — Inpatient Hospital Stay (HOSPITAL_COMMUNITY): Payer: BLUE CROSS/BLUE SHIELD | Admitting: Physician Assistant

## 2018-07-14 ENCOUNTER — Encounter (HOSPITAL_COMMUNITY)
Admission: RE | Disposition: A | Discharge status: Home / Self Care | Payer: Self-pay | Source: Home / Self Care | Attending: Orthopedic Surgery

## 2018-07-14 DIAGNOSIS — Z833 Family history of diabetes mellitus: Secondary | ICD-10-CM

## 2018-07-14 DIAGNOSIS — Z7982 Long term (current) use of aspirin: Secondary | ICD-10-CM

## 2018-07-14 DIAGNOSIS — Y792 Prosthetic and other implants, materials and accessory orthopedic devices associated with adverse incidents: Secondary | ICD-10-CM | POA: Diagnosis present

## 2018-07-14 DIAGNOSIS — T84093A Other mechanical complication of internal left knee prosthesis, initial encounter: Secondary | ICD-10-CM | POA: Diagnosis not present

## 2018-07-14 DIAGNOSIS — Z86010 Personal history of colonic polyps: Secondary | ICD-10-CM

## 2018-07-14 DIAGNOSIS — Z87410 Personal history of cervical dysplasia: Secondary | ICD-10-CM | POA: Diagnosis not present

## 2018-07-14 DIAGNOSIS — G8918 Other acute postprocedural pain: Secondary | ICD-10-CM | POA: Diagnosis not present

## 2018-07-14 DIAGNOSIS — T84018A Broken internal joint prosthesis, other site, initial encounter: Secondary | ICD-10-CM

## 2018-07-14 DIAGNOSIS — E119 Type 2 diabetes mellitus without complications: Secondary | ICD-10-CM | POA: Diagnosis present

## 2018-07-14 DIAGNOSIS — I10 Essential (primary) hypertension: Secondary | ICD-10-CM | POA: Diagnosis present

## 2018-07-14 DIAGNOSIS — E669 Obesity, unspecified: Secondary | ICD-10-CM | POA: Diagnosis not present

## 2018-07-14 DIAGNOSIS — Z96643 Presence of artificial hip joint, bilateral: Secondary | ICD-10-CM | POA: Diagnosis not present

## 2018-07-14 DIAGNOSIS — F329 Major depressive disorder, single episode, unspecified: Secondary | ICD-10-CM | POA: Diagnosis not present

## 2018-07-14 DIAGNOSIS — Z96652 Presence of left artificial knee joint: Secondary | ICD-10-CM | POA: Diagnosis present

## 2018-07-14 DIAGNOSIS — Z8249 Family history of ischemic heart disease and other diseases of the circulatory system: Secondary | ICD-10-CM | POA: Diagnosis not present

## 2018-07-14 DIAGNOSIS — F419 Anxiety disorder, unspecified: Secondary | ICD-10-CM | POA: Diagnosis present

## 2018-07-14 DIAGNOSIS — Z91048 Other nonmedicinal substance allergy status: Secondary | ICD-10-CM

## 2018-07-14 DIAGNOSIS — Z79899 Other long term (current) drug therapy: Secondary | ICD-10-CM | POA: Diagnosis not present

## 2018-07-14 DIAGNOSIS — Z6837 Body mass index (BMI) 37.0-37.9, adult: Secondary | ICD-10-CM | POA: Diagnosis not present

## 2018-07-14 DIAGNOSIS — E7800 Pure hypercholesterolemia, unspecified: Secondary | ICD-10-CM | POA: Diagnosis present

## 2018-07-14 DIAGNOSIS — Z87891 Personal history of nicotine dependence: Secondary | ICD-10-CM

## 2018-07-14 DIAGNOSIS — M1612 Unilateral primary osteoarthritis, left hip: Secondary | ICD-10-CM | POA: Diagnosis not present

## 2018-07-14 DIAGNOSIS — M25562 Pain in left knee: Secondary | ICD-10-CM | POA: Diagnosis present

## 2018-07-14 DIAGNOSIS — Z7984 Long term (current) use of oral hypoglycemic drugs: Secondary | ICD-10-CM

## 2018-07-14 DIAGNOSIS — H409 Unspecified glaucoma: Secondary | ICD-10-CM | POA: Diagnosis not present

## 2018-07-14 DIAGNOSIS — Z96659 Presence of unspecified artificial knee joint: Secondary | ICD-10-CM

## 2018-07-14 HISTORY — DX: Other mechanical complication of internal left knee prosthesis, initial encounter: T84.093A

## 2018-07-14 HISTORY — DX: Broken internal joint prosthesis, other site, initial encounter: T84.018A

## 2018-07-14 HISTORY — PX: TOTAL KNEE REVISION: SHX996

## 2018-07-14 LAB — TYPE AND SCREEN
ABO/RH(D): B POS
Antibody Screen: NEGATIVE

## 2018-07-14 LAB — GLUCOSE, CAPILLARY
Glucose-Capillary: 105 mg/dL — ABNORMAL HIGH (ref 70–99)
Glucose-Capillary: 96 mg/dL (ref 70–99)
Glucose-Capillary: 110 mg/dL — ABNORMAL HIGH (ref 70–99)
Glucose-Capillary: 162 mg/dL — ABNORMAL HIGH (ref 70–99)

## 2018-07-14 SURGERY — TOTAL KNEE REVISION
Anesthesia: Spinal | Site: Knee | Laterality: Left

## 2018-07-14 MED ORDER — DEXAMETHASONE SODIUM PHOSPHATE 10 MG/ML IJ SOLN
8.0000 mg | Freq: Once | INTRAMUSCULAR | Status: AC
  Administered 2018-07-14: 8 mg via INTRAVENOUS

## 2018-07-14 MED ORDER — BISACODYL 10 MG RE SUPP: 10.0000 mg | Freq: Every day | RECTAL | Status: DC | PRN

## 2018-07-14 MED ORDER — LACTATED RINGERS IV SOLN
INTRAVENOUS | Status: DC
  Administered 2018-07-14 (×2): via INTRAVENOUS

## 2018-07-14 MED ORDER — CEFAZOLIN SODIUM-DEXTROSE 2-4 GM/100ML-% IV SOLN
2.0000 g | INTRAVENOUS | Status: AC
  Administered 2018-07-14: 2 g via INTRAVENOUS
  Filled 2018-07-14: qty 100

## 2018-07-14 MED ORDER — AMLODIPINE BESYLATE 5 MG PO TABS
5.0000 mg | ORAL_TABLET | Freq: Every day | ORAL | Status: DC
  Administered 2018-07-15 – 2018-07-16 (×2): 5 mg via ORAL
  Filled 2018-07-14 (×2): qty 1

## 2018-07-14 MED ORDER — CITALOPRAM HYDROBROMIDE 20 MG PO TABS
40.0000 mg | ORAL_TABLET | Freq: Every day | ORAL | Status: DC
  Administered 2018-07-15 – 2018-07-16 (×2): 40 mg via ORAL
  Filled 2018-07-14 (×2): qty 2

## 2018-07-14 MED ORDER — 0.9 % SODIUM CHLORIDE (POUR BTL) OPTIME
TOPICAL | Status: DC | PRN
  Administered 2018-07-14: 1000 mL

## 2018-07-14 MED ORDER — ONDANSETRON HCL 4 MG/2ML IJ SOLN: 4.0000 mg | Freq: Four times a day (QID) | INTRAMUSCULAR | Status: DC | PRN

## 2018-07-14 MED ORDER — SODIUM CHLORIDE 0.9 % IR SOLN
Status: DC | PRN
  Administered 2018-07-14: 3000 mL

## 2018-07-14 MED ORDER — ATORVASTATIN CALCIUM 20 MG PO TABS
20.0000 mg | ORAL_TABLET | Freq: Every day | ORAL | Status: DC
  Administered 2018-07-15: 20 mg via ORAL
  Filled 2018-07-14: qty 1

## 2018-07-14 MED ORDER — FLEET ENEMA 7-19 GM/118ML RE ENEM: 1.0000 | ENEMA | Freq: Once | RECTAL | Status: DC | PRN

## 2018-07-14 MED ORDER — BUPIVACAINE IN DEXTROSE 0.75-8.25 % IT SOLN
INTRATHECAL | Status: DC | PRN
  Administered 2018-07-14: 1.8 mL via INTRATHECAL

## 2018-07-14 MED ORDER — EPHEDRINE SULFATE-NACL 50-0.9 MG/10ML-% IV SOSY
PREFILLED_SYRINGE | INTRAVENOUS | Status: DC | PRN
  Administered 2018-07-14: 10 mg via INTRAVENOUS
  Administered 2018-07-14: 5 mg via INTRAVENOUS

## 2018-07-14 MED ORDER — ASPIRIN EC 325 MG PO TBEC
325.0000 mg | DELAYED_RELEASE_TABLET | Freq: Two times a day (BID) | ORAL | Status: DC
  Administered 2018-07-15 – 2018-07-16 (×3): 325 mg via ORAL
  Filled 2018-07-14 (×3): qty 1

## 2018-07-14 MED ORDER — PROPOFOL 500 MG/50ML IV EMUL
INTRAVENOUS | Status: DC | PRN
  Administered 2018-07-14: 75 ug/kg/min via INTRAVENOUS

## 2018-07-14 MED ORDER — FENTANYL CITRATE (PF) 100 MCG/2ML IJ SOLN
50.0000 ug | Freq: Once | INTRAMUSCULAR | Status: AC
  Administered 2018-07-14: 50 ug via INTRAVENOUS
  Filled 2018-07-14: qty 2

## 2018-07-14 MED ORDER — STERILE WATER FOR IRRIGATION IR SOLN
Status: DC | PRN
  Administered 2018-07-14: 2000 mL

## 2018-07-14 MED ORDER — MIDAZOLAM HCL 2 MG/2ML IJ SOLN
1.0000 mg | Freq: Once | INTRAMUSCULAR | Status: AC
  Administered 2018-07-14: 2 mg via INTRAVENOUS
  Filled 2018-07-14: qty 2

## 2018-07-14 MED ORDER — POLYETHYLENE GLYCOL 3350 17 G PO PACK
17.0000 g | PACK | Freq: Every day | ORAL | Status: DC | PRN
  Administered 2018-07-16: 17 g via ORAL
  Filled 2018-07-14: qty 1

## 2018-07-14 MED ORDER — ONDANSETRON HCL 4 MG/2ML IJ SOLN
INTRAMUSCULAR | Status: DC | PRN
  Administered 2018-07-14: 4 mg via INTRAVENOUS

## 2018-07-14 MED ORDER — LATANOPROST 0.005 % OP SOLN
1.0000 [drp] | Freq: Every day | OPHTHALMIC | Status: DC
  Administered 2018-07-14 – 2018-07-15 (×2): 1 [drp] via OPHTHALMIC
  Filled 2018-07-14: qty 2.5

## 2018-07-14 MED ORDER — OXYCODONE HCL 5 MG/5ML PO SOLN: 5.0000 mg | Freq: Once | ORAL | Status: DC | PRN

## 2018-07-14 MED ORDER — METOCLOPRAMIDE HCL 5 MG PO TABS: 5.0000 mg | ORAL_TABLET | Freq: Three times a day (TID) | ORAL | Status: DC | PRN

## 2018-07-14 MED ORDER — TRANEXAMIC ACID-NACL 1000-0.7 MG/100ML-% IV SOLN
1000.0000 mg | Freq: Once | INTRAVENOUS | Status: AC
  Administered 2018-07-14: 1000 mg via INTRAVENOUS
  Filled 2018-07-14: qty 100

## 2018-07-14 MED ORDER — ACETAMINOPHEN 500 MG PO TABS
1000.0000 mg | ORAL_TABLET | Freq: Four times a day (QID) | ORAL | Status: AC
  Administered 2018-07-14 – 2018-07-15 (×4): 1000 mg via ORAL
  Filled 2018-07-14 (×4): qty 2

## 2018-07-14 MED ORDER — PROPOFOL 10 MG/ML IV BOLUS
INTRAVENOUS | Status: AC
  Filled 2018-07-14: qty 20

## 2018-07-14 MED ORDER — OXYCODONE HCL 5 MG PO TABS
5.0000 mg | ORAL_TABLET | ORAL | Status: DC | PRN
  Administered 2018-07-14 (×2): 5 mg via ORAL
  Filled 2018-07-14 (×2): qty 1

## 2018-07-14 MED ORDER — SODIUM CHLORIDE (PF) 0.9 % IJ SOLN
INTRAMUSCULAR | Status: DC | PRN
  Administered 2018-07-14: 60 mL

## 2018-07-14 MED ORDER — INSULIN ASPART 100 UNIT/ML ~~LOC~~ SOLN
0.0000 [IU] | Freq: Three times a day (TID) | SUBCUTANEOUS | Status: DC
  Administered 2018-07-15: 2 [IU] via SUBCUTANEOUS

## 2018-07-14 MED ORDER — FENTANYL CITRATE (PF) 100 MCG/2ML IJ SOLN: 25.0000 ug | INTRAMUSCULAR | Status: DC | PRN

## 2018-07-14 MED ORDER — SODIUM CHLORIDE (PF) 0.9 % IJ SOLN
INTRAMUSCULAR | Status: AC
  Filled 2018-07-14: qty 10

## 2018-07-14 MED ORDER — METOCLOPRAMIDE HCL 5 MG/ML IJ SOLN: 5.0000 mg | Freq: Three times a day (TID) | INTRAMUSCULAR | Status: DC | PRN

## 2018-07-14 MED ORDER — MORPHINE SULFATE (PF) 2 MG/ML IV SOLN
1.0000 mg | INTRAVENOUS | Status: DC | PRN
  Administered 2018-07-14: 1 mg via INTRAVENOUS
  Filled 2018-07-14: qty 1

## 2018-07-14 MED ORDER — DIPHENHYDRAMINE HCL 12.5 MG/5ML PO ELIX: 12.5000 mg | ORAL_SOLUTION | ORAL | Status: DC | PRN

## 2018-07-14 MED ORDER — DOCUSATE SODIUM 100 MG PO CAPS
100.0000 mg | ORAL_CAPSULE | Freq: Two times a day (BID) | ORAL | Status: DC
  Administered 2018-07-14 – 2018-07-16 (×4): 100 mg via ORAL
  Filled 2018-07-14 (×4): qty 1

## 2018-07-14 MED ORDER — CEFAZOLIN SODIUM-DEXTROSE 2-4 GM/100ML-% IV SOLN
2.0000 g | Freq: Four times a day (QID) | INTRAVENOUS | Status: AC
  Administered 2018-07-14 – 2018-07-15 (×2): 2 g via INTRAVENOUS
  Filled 2018-07-14 (×2): qty 100

## 2018-07-14 MED ORDER — SODIUM CHLORIDE 0.9 % IV SOLN
INTRAVENOUS | Status: DC | PRN
  Administered 2018-07-14: 20 ug/min via INTRAVENOUS

## 2018-07-14 MED ORDER — METHOCARBAMOL 500 MG PO TABS
500.0000 mg | ORAL_TABLET | Freq: Four times a day (QID) | ORAL | Status: DC | PRN
  Administered 2018-07-14 – 2018-07-16 (×6): 500 mg via ORAL
  Filled 2018-07-14 (×6): qty 1

## 2018-07-14 MED ORDER — CHLORHEXIDINE GLUCONATE 4 % EX LIQD: 60.0000 mL | Freq: Once | CUTANEOUS | Status: DC

## 2018-07-14 MED ORDER — GABAPENTIN 300 MG PO CAPS
300.0000 mg | ORAL_CAPSULE | Freq: Once | ORAL | Status: AC
  Administered 2018-07-14: 300 mg via ORAL
  Filled 2018-07-14: qty 1

## 2018-07-14 MED ORDER — PROPOFOL 10 MG/ML IV BOLUS
INTRAVENOUS | Status: AC
  Filled 2018-07-14: qty 40

## 2018-07-14 MED ORDER — METHOCARBAMOL 500 MG IVPB - SIMPLE MED
INTRAVENOUS | Status: AC
  Filled 2018-07-14: qty 50

## 2018-07-14 MED ORDER — DEXAMETHASONE SODIUM PHOSPHATE 10 MG/ML IJ SOLN
10.0000 mg | Freq: Once | INTRAMUSCULAR | Status: AC
  Administered 2018-07-15: 10 mg via INTRAVENOUS
  Filled 2018-07-14: qty 1

## 2018-07-14 MED ORDER — PHENOL 1.4 % MT LIQD: 1.0000 | OROMUCOSAL | Status: DC | PRN

## 2018-07-14 MED ORDER — ONDANSETRON HCL 4 MG/2ML IJ SOLN: 4.0000 mg | Freq: Once | INTRAMUSCULAR | Status: DC | PRN

## 2018-07-14 MED ORDER — ONDANSETRON HCL 4 MG PO TABS: 4.0000 mg | ORAL_TABLET | Freq: Four times a day (QID) | ORAL | Status: DC | PRN

## 2018-07-14 MED ORDER — SODIUM CHLORIDE 0.9 % IV SOLN
INTRAVENOUS | Status: DC
  Administered 2018-07-14 – 2018-07-15 (×2): via INTRAVENOUS

## 2018-07-14 MED ORDER — BUPIVACAINE LIPOSOME 1.3 % IJ SUSP
INTRAMUSCULAR | Status: DC | PRN
  Administered 2018-07-14: 20 mL

## 2018-07-14 MED ORDER — METHOCARBAMOL 500 MG IVPB - SIMPLE MED
500.0000 mg | Freq: Four times a day (QID) | INTRAVENOUS | Status: DC | PRN
  Administered 2018-07-14: 500 mg via INTRAVENOUS
  Filled 2018-07-14: qty 50

## 2018-07-14 MED ORDER — SODIUM CHLORIDE (PF) 0.9 % IJ SOLN
INTRAMUSCULAR | Status: AC
  Filled 2018-07-14: qty 50

## 2018-07-14 MED ORDER — OXYCODONE HCL 5 MG PO TABS
10.0000 mg | ORAL_TABLET | ORAL | Status: DC | PRN
  Administered 2018-07-15 – 2018-07-16 (×9): 10 mg via ORAL
  Filled 2018-07-14 (×9): qty 2

## 2018-07-14 MED ORDER — TRANEXAMIC ACID-NACL 1000-0.7 MG/100ML-% IV SOLN
1000.0000 mg | INTRAVENOUS | Status: AC
  Administered 2018-07-14: 1000 mg via INTRAVENOUS
  Filled 2018-07-14: qty 100

## 2018-07-14 MED ORDER — ACETAMINOPHEN 10 MG/ML IV SOLN
1000.0000 mg | Freq: Four times a day (QID) | INTRAVENOUS | Status: DC
  Administered 2018-07-14: 1000 mg via INTRAVENOUS
  Filled 2018-07-14: qty 100

## 2018-07-14 MED ORDER — ROPIVACAINE HCL 7.5 MG/ML IJ SOLN
INTRAMUSCULAR | Status: DC | PRN
  Administered 2018-07-14: 20 mL via PERINEURAL

## 2018-07-14 MED ORDER — OXYCODONE HCL 5 MG PO TABS: 5.0000 mg | ORAL_TABLET | Freq: Once | ORAL | Status: DC | PRN

## 2018-07-14 MED ORDER — MENTHOL 3 MG MT LOZG: 1.0000 | LOZENGE | OROMUCOSAL | Status: DC | PRN

## 2018-07-14 MED ORDER — LOSARTAN POTASSIUM 50 MG PO TABS
100.0000 mg | ORAL_TABLET | Freq: Every day | ORAL | Status: DC
  Administered 2018-07-15 – 2018-07-16 (×2): 100 mg via ORAL
  Filled 2018-07-14 (×2): qty 2

## 2018-07-14 SURGICAL SUPPLY — 61 items
ADAPTER BOLT FEMORAL +2/-2 (Knees) ×1 IMPLANT
BAG DECANTER FOR FLEXI CONT (MISCELLANEOUS) ×2 IMPLANT
BAG ZIPLOCK 12X15 (MISCELLANEOUS) ×1 IMPLANT
BANDAGE ACE 6X5 VEL STRL LF (GAUZE/BANDAGES/DRESSINGS) ×2 IMPLANT
BLADE SAG 18X100X1.27 (BLADE) ×2 IMPLANT
BLADE SAW SGTL 11.0X1.19X90.0M (BLADE) ×2 IMPLANT
BLADE SURG SZ10 CARB STEEL (BLADE) ×4 IMPLANT
BONE CEMENT GENTAMICIN (Cement) ×6 IMPLANT
CEMENT BONE GENTAMICIN 40 (Cement) ×3 IMPLANT
CEMENT RESTRICTOR DEPUY SZ 3 (Cement) ×1 IMPLANT
CLOTH BEACON ORANGE TIMEOUT ST (SAFETY) ×2 IMPLANT
COVER SURGICAL LIGHT HANDLE (MISCELLANEOUS) ×2 IMPLANT
CUFF TOURN SGL QUICK 34 (TOURNIQUET CUFF) ×1
CUFF TRNQT CYL 34X4X40X1 (TOURNIQUET CUFF) ×1 IMPLANT
DECANTER SPIKE VIAL GLASS SM (MISCELLANEOUS) ×1 IMPLANT
DISTAL WEDGE PFC 4MM LEFT (Knees) ×4 IMPLANT
DRAPE U-SHAPE 47X51 STRL (DRAPES) ×2 IMPLANT
DRSG ADAPTIC 3X8 NADH LF (GAUZE/BANDAGES/DRESSINGS) ×2 IMPLANT
DURAPREP 26ML APPLICATOR (WOUND CARE) ×2 IMPLANT
ELECT REM PT RETURN 15FT ADLT (MISCELLANEOUS) ×2 IMPLANT
EVACUATOR 1/8 PVC DRAIN (DRAIN) ×2 IMPLANT
FEM TC3 PFC SZ3 LEFT (Orthopedic Implant) ×2 IMPLANT
FEMORAL ADAPTER (Orthopedic Implant) ×1 IMPLANT
FEMORAL TC3 PFC SZ3 LEFT (Orthopedic Implant) IMPLANT
GAUZE SPONGE 4X4 12PLY STRL (GAUZE/BANDAGES/DRESSINGS) ×2 IMPLANT
GLOVE BIO SURGEON STRL SZ7 (GLOVE) ×2 IMPLANT
GLOVE BIO SURGEON STRL SZ8 (GLOVE) ×2 IMPLANT
GLOVE BIOGEL PI IND STRL 7.0 (GLOVE) ×1 IMPLANT
GLOVE BIOGEL PI IND STRL 8 (GLOVE) ×1 IMPLANT
GLOVE BIOGEL PI INDICATOR 7.0 (GLOVE) ×1
GLOVE BIOGEL PI INDICATOR 8 (GLOVE) ×1
GOWN STRL REUS W/TWL LRG LVL3 (GOWN DISPOSABLE) ×4 IMPLANT
GOWN STRL REUS W/TWL XL LVL3 (GOWN DISPOSABLE) ×2 IMPLANT
HANDPIECE INTERPULSE COAX TIP (DISPOSABLE) ×1
HOLDER FOLEY CATH W/STRAP (MISCELLANEOUS) ×1 IMPLANT
IMMOBILIZER KNEE 20 (SOFTGOODS) ×2
IMMOBILIZER KNEE 20 THIGH 36 (SOFTGOODS) ×1 IMPLANT
INSERT TIBIAL TC3 15.0 (Knees) ×1 IMPLANT
MANIFOLD NEPTUNE II (INSTRUMENTS) ×2 IMPLANT
PACK TOTAL KNEE CUSTOM (KITS) ×2 IMPLANT
PAD ABD 8X10 STRL (GAUZE/BANDAGES/DRESSINGS) ×1 IMPLANT
PADDING CAST COTTON 6X4 STRL (CAST SUPPLIES) ×5 IMPLANT
PATELLA DOME PFC 38MM (Knees) ×1 IMPLANT
PROTECTOR NERVE ULNAR (MISCELLANEOUS) ×2 IMPLANT
SET HNDPC FAN SPRY TIP SCT (DISPOSABLE) ×1 IMPLANT
STEM TIBIA PFC 13X30MM (Stem) ×1 IMPLANT
STEM UNIVERSAL REVISION 75X14 (Stem) ×1 IMPLANT
STRIP CLOSURE SKIN 1/2X4 (GAUZE/BANDAGES/DRESSINGS) ×3 IMPLANT
SUT MNCRL AB 4-0 PS2 18 (SUTURE) ×2 IMPLANT
SUT STRATAFIX 0 PDS 27 VIOLET (SUTURE) ×2
SUT VIC AB 2-0 CT1 27 (SUTURE) ×3
SUT VIC AB 2-0 CT1 TAPERPNT 27 (SUTURE) ×3 IMPLANT
SUTURE STRATFX 0 PDS 27 VIOLET (SUTURE) ×1 IMPLANT
SYR 50ML LL SCALE MARK (SYRINGE) ×4 IMPLANT
TOWER CARTRIDGE SMART MIX (DISPOSABLE) ×2 IMPLANT
TRAY FOLEY MTR SLVR 14FR STAT (SET/KITS/TRAYS/PACK) ×1 IMPLANT
TRAY REVISION SZ 2.5 (Knees) ×1 IMPLANT
TRAY SLEEVE CEM ML (Knees) ×1 IMPLANT
WEDGE DISTAL PFC LEFT 4MM (Knees) IMPLANT
WEDGE STEP 2.5 10MM (Knees) ×2 IMPLANT
WRAP KNEE MAXI GEL POST OP (GAUZE/BANDAGES/DRESSINGS) ×1 IMPLANT

## 2018-07-14 NOTE — Discharge Instructions (Signed)
Dr. Gaynelle Arabian Total Joint Specialist [facility] [address], [place], [place] [address] [phone]  TOTAL KNEE REVISION POSTOPERATIVE DIRECTIONS  Knee Rehabilitation, Guidelines Following Surgery  Results after knee surgery are often greatly improved when you follow the exercise, range of motion and muscle strengthening exercises prescribed by your doctor. Safety measures are also important to protect the knee from further injury. Any time any of these exercises cause you to have increased pain or swelling in your knee joint, decrease the amount until you are comfortable again and slowly increase them. If you have problems or questions, call your caregiver or physical therapist for advice.   HOME CARE INSTRUCTIONS   Remove items at home which could result in a fall. This includes throw rugs or furniture in walking pathways.   ICE to the affected knee every three hours for 30 minutes at a time and then as needed for pain and swelling.  Continue to use ice on the knee for pain and swelling from surgery. You may notice swelling that will progress down to the foot and ankle.  This is normal after surgery.  Elevate the leg when you are not up walking on it.    Continue to use the breathing machine which will help keep your temperature down.  It is common for your temperature to cycle up and down following surgery, especially at night when you are not up moving around and exerting yourself.  The breathing machine keeps your lungs expanded and your temperature down.  Do not place pillow under knee, focus on keeping the knee straight while resting  DIET You may resume your previous home diet once your are discharged from the hospital.  DRESSING / WOUND CARE / SHOWERING You may shower 3 days after surgery, but keep the wounds dry during showering.  You may use an occlusive plastic wrap (Press'n Seal for example), NO SOAKING/SUBMERGING IN THE BATHTUB.  If the bandage gets  wet, change with a clean dry gauze.  If the incision gets wet, pat the wound dry with a clean towel. You may start showering once you are discharged home but do not submerge the incision under water. Just pat the incision dry and apply a dry gauze dressing on daily. Change the surgical dressing daily and reapply a dry dressing each time.  ACTIVITY Walk with your walker as instructed. Use walker as long as suggested by your caregivers. Avoid periods of inactivity such as sitting longer than an hour when not asleep. This helps prevent blood clots.  You may resume a sexual relationship in one month or when given the OK by your doctor.  You may return to work once you are cleared by your doctor.  Do not drive a car for 6 weeks or until released by you surgeon.  Do not drive while taking narcotics.  WEIGHT BEARING Weight bearing as tolerated with assist device (walker, cane, etc) as directed, use it as long as suggested by your surgeon or therapist, typically at least 4-6 weeks.  POSTOPERATIVE CONSTIPATION PROTOCOL Constipation - defined medically as fewer than three stools per week and severe constipation as less than one stool per week.  One of the most common issues patients have following surgery is constipation.  Even if you have a regular bowel pattern at home, your normal regimen is likely to be disrupted due to multiple reasons following surgery.  Combination of anesthesia, postoperative narcotics, change in appetite and fluid intake all can affect your bowels.  In order to avoid complications following surgery, here are some recommendations in order to help you during your recovery period. ° °Colace (docusate) - Pick up an over-the-counter form of Colace or another stool softener and take twice a day as long as you are requiring postoperative pain medications.  Take with a full glass of water daily.  If you experience loose stools or diarrhea, hold the colace until you stool forms back up.  If  your symptoms do not get better within 1 week or if they get worse, check with your doctor. ° °Dulcolax (bisacodyl) - Pick up over-the-counter and take as directed by the product packaging as needed to assist with the movement of your bowels.  Take with a full glass of water.  Use this product as needed if not relieved by Colace only.  ° °MiraLax (polyethylene glycol) - Pick up over-the-counter to have on hand.  MiraLax is a solution that will increase the amount of water in your bowels to assist with bowel movements.  Take as directed and can mix with a glass of water, juice, soda, coffee, or tea.  Take if you go more than two days without a movement. °Do not use MiraLax more than once per day. Call your doctor if you are still constipated or irregular after using this medication for 7 days in a row. ° °If you continue to have problems with postoperative constipation, please contact the office for further assistance and recommendations.  If you experience "the worst abdominal pain ever" or develop nausea or vomiting, please contact the office immediatly for further recommendations for treatment. ° °ITCHING ° If you experience itching with your medications, try taking only a single pain pill, or even half a pain pill at a time.  You can also use Benadryl over the counter for itching or also to help with sleep.  ° °TED HOSE STOCKINGS °Wear the elastic stockings on both legs for three weeks following surgery during the day but you may remove then at night for sleeping. ° °MEDICATIONS °See your medication summary on the “After Visit Summary” that the nursing staff will review with you prior to discharge.  You may have some home medications which will be placed on hold until you complete the course of blood thinner medication.  It is important for you to complete the blood thinner medication as prescribed by your surgeon.  Continue your approved medications as instructed at time of discharge. ° °PRECAUTIONS °If you  experience chest pain or shortness of breath - call 911 immediately for transfer to the hospital emergency department.  °If you develop a fever greater that 101 F, purulent drainage from wound, increased redness or drainage from wound, foul odor from the wound/dressing, or calf pain - CONTACT YOUR SURGEON.   °                                                °FOLLOW-UP APPOINTMENTS °Make sure you keep all of your appointments after your operation with your surgeon and caregivers. You should call the office at the above phone number and make an appointment for approximately two weeks after the date of your surgery or on the date instructed by your surgeon outlined in the "After Visit Summary". ° ° °RANGE OF MOTION AND STRENGTHENING EXERCISES  °Rehabilitation of the knee is important following a knee injury or   an operation. After just a few days of immobilization, the muscles of the thigh which control the knee become weakened and shrink (atrophy). Knee exercises are designed to build up the tone and strength of the thigh muscles and to improve knee motion. Often times heat used for twenty to thirty minutes before working out will loosen up your tissues and help with improving the range of motion but do not use heat for the first two weeks following surgery. These exercises can be done on a training (exercise) mat, on the floor, on a table or on a bed. Use what ever works the best and is most comfortable for you Knee exercises include:  °• Leg Lifts - While your knee is still immobilized in a splint or cast, you can do straight leg raises. Lift the leg to 60 degrees, hold for 3 sec, and slowly lower the leg. Repeat 10-20 times 2-3 times daily. Perform this exercise against resistance later as your knee gets better.  °• Quad and Hamstring Sets - Tighten up the muscle on the front of the thigh (Quad) and hold for 5-10 sec. Repeat this 10-20 times hourly. Hamstring sets are done by pushing the foot backward against an  object and holding for 5-10 sec. Repeat as with quad sets.  °· Leg Slides: Lying on your back, slowly slide your foot toward your buttocks, bending your knee up off the floor (only go as far as is comfortable). Then slowly slide your foot back down until your leg is flat on the floor again. °· Angel Wings: Lying on your back spread your legs to the side as far apart as you can without causing discomfort.  °A rehabilitation program following serious knee injuries can speed recovery and prevent re-injury in the future due to weakened muscles. Contact your doctor or a physical therapist for more information on knee rehabilitation.  ° °IF YOU ARE TRANSFERRED TO A SKILLED REHAB FACILITY °If the patient is transferred to a skilled rehab facility following release from the hospital, a list of the current medications will be sent to the facility for the patient to continue.  When discharged from the skilled rehab facility, please have the facility set up the patient's Home Health Physical Therapy prior to being released. Also, the skilled facility will be responsible for providing the patient with their medications at time of release from the facility to include their pain medication, the muscle relaxants, and their blood thinner medication. If the patient is still at the rehab facility at time of the two week follow up appointment, the skilled rehab facility will also need to assist the patient in arranging follow up appointment in our office and any transportation needs. ° °MAKE SURE YOU:  °• Understand these instructions.  °• Get help right away if you are not doing well or get worse.  ° ° °Pick up stool softner and laxative for home use following surgery while on pain medications. °Do not submerge incision under water. °Please use good hand washing techniques while changing dressing each day. °May shower starting three days after surgery. °Please use a clean towel to pat the incision dry following showers. °Continue to  use ice for pain and swelling after surgery. °Do not use any lotions or creams on the incision until instructed by your surgeon. ° °

## 2018-07-14 NOTE — Anesthesia Preprocedure Evaluation (Addendum)
Anesthesia Evaluation  Patient identified by MRN, date of birth, ID band Patient awake    Reviewed: Allergy & Precautions, NPO status , Patient's Chart, lab work & pertinent test results  History of Anesthesia Complications Negative for: history of anesthetic complications  Airway Mallampati: II  TM Distance: >3 FB Neck ROM: Full    Dental  (+) Dental Advisory Given, Teeth Intact   Pulmonary former smoker,   Possible OSA undiagnosed    breath sounds clear to auscultation       Cardiovascular hypertension, Pt. on medications + Valvular Problems/Murmurs  Rhythm:Regular Rate:Normal     Neuro/Psych PSYCHIATRIC DISORDERS Anxiety Depression negative neurological ROS     GI/Hepatic negative GI ROS, Neg liver ROS,   Endo/Other  diabetes, Type 2, Oral Hypoglycemic Agents Obesity   Renal/GU negative Renal ROS     Musculoskeletal  (+) Arthritis , Osteoarthritis,    Abdominal (+) + obese,   Peds  Hematology negative hematology ROS (+)   Anesthesia Other Findings   Reproductive/Obstetrics                            Anesthesia Physical Anesthesia Plan  ASA: II  Anesthesia Plan: Spinal   Post-op Pain Management:  [facility] for Post-op pain   Induction:   PONV Risk Score and Plan: 2 and Treatment may vary due to age or medical condition and Propofol infusion  Airway Management Planned: Natural Airway and Simple Face Mask  Additional Equipment: None  Intra-op Plan:   Post-operative Plan:   Informed Consent: I have reviewed the patients History and Physical, chart, labs and discussed the procedure including the risks, benefits and alternatives for the proposed anesthesia with the patient or authorized representative who has indicated his/her understanding and acceptance.       Plan Discussed with: CRNA and Anesthesiologist  Anesthesia Plan Comments: (Labs reviewed, platelets  acceptable. Discussed risks and benefits of spinal, including spinal/epidural hematoma, infection, failed block, and PDPH. Patient expressed understanding and wished to proceed. )       Anesthesia Quick Evaluation

## 2018-07-14 NOTE — Anesthesia Procedure Notes (Signed)
Spinal  Patient location during procedure: OR Start time: 07/14/2018 12:07 PM End time: 07/14/2018 12:16 PM Staffing Anesthesiologist: Audry Pili, MD Performed: anesthesiologist  Preanesthetic Checklist Completed: patient identified, surgical consent, pre-op evaluation, timeout performed, IV checked, risks and benefits discussed and monitors and equipment checked Spinal Block Patient position: sitting Prep: DuraPrep Patient monitoring: heart rate, cardiac monitor, continuous pulse ox and blood pressure Approach: midline Location: L2-3 Injection technique: single-shot Needle Needle type: Quincke  Needle gauge: 22 G Additional Notes Consent was obtained prior to the procedure with all questions answered and concerns addressed. Risks including, but not limited to, bleeding, infection, nerve damage, paralysis, failed block, inadequate analgesia, allergic reaction, high spinal, itching, and headache were discussed and the patient wished to proceed. Functioning IV was confirmed and monitors were applied. Sterile prep and drape, including hand hygiene, mask, and sterile gloves were used. The patient was positioned and the spine was prepped. The skin was anesthetized with lidocaine. Free flow of clear CSF was obtained prior to injecting local anesthetic into the CSF. The spinal needle aspirated freely following injection. The needle was carefully withdrawn. The patient tolerated the procedure well.   Renold Don, MD

## 2018-07-14 NOTE — Transfer of Care (Signed)
Immediate Anesthesia Transfer of Care Note  Patient: Katherine Lawrence  Procedure(s) Performed: TOTAL KNEE REVISION (Left Knee)  Patient Location: PACU  Anesthesia Type:Spinal  Level of Consciousness: awake, alert , oriented and patient cooperative  Airway & Oxygen Therapy: Patient Spontanous Breathing  Post-op Assessment: Report given to RN  Post vital signs: Reviewed and stable  Last Vitals:  Vitals Value Taken Time  BP    Temp    Pulse 66 07/14/2018  2:42 PM  Resp 21 07/14/2018  2:42 PM  SpO2 86 % 07/14/2018  2:42 PM  Vitals shown include unvalidated device data.  Last Pain:  Vitals:   07/14/18 1126  TempSrc:   PainSc: 0-No pain      Patients Stated Pain Goal: 3 (44/[id number]/95 [id number])  Complications: No apparent anesthesia complications

## 2018-07-14 NOTE — Progress Notes (Signed)
PACU NURSING NOTE: reassessment of hemovac to left knee noted to no longer fully charged, upon assessment of drainage tubing, one tube noted to be fully out of left knee. Spoke with Dr. Wynelle Link informed of findings, instructed to "tie off" tube and recharge hemovac. Tube care provided, hemovac now noted to be holding charge at this time, will cont to observe and monitor and will notify surgeon if changes occur.

## 2018-07-14 NOTE — Brief Op Note (Signed)
07/14/2018  2:08 PM  PATIENT:  Katherine Lawrence  63 y.o. female  PRE-OPERATIVE DIAGNOSIS:  failed left total knee arthroplasty  POST-OPERATIVE DIAGNOSIS:  failed left total knee arthroplasty  PROCEDURE:  Procedure(s) with comments: TOTAL KNEE REVISION (Left) - Adductor Block  SURGEON:  Surgeon(s) and Role:    Gaynelle Arabian, MD - Primary  PHYSICIAN ASSISTANT:   ASSISTANTS: Theresa Duty, PA-C   ANESTHESIA:   Adductor canal block and spinal  EBL:  25 mL   BLOOD ADMINISTERED:none  DRAINS: (Medium) Hemovact drain(s) in the left knee with  Suction Open   LOCAL MEDICATIONS USED:  OTHER Exparel  COUNTS:  YES  TOURNIQUET:   Total Tourniquet Time Documented: Thigh (Left) - 60 minutes Thigh (Left) - 20 minutes Total: Thigh (Left) - 80 minutes   DICTATION: .Other Dictation: Dictation Number [id number]  PLAN OF CARE: Admit to inpatient   PATIENT DISPOSITION:  PACU - hemodynamically stable.

## 2018-07-14 NOTE — Op Note (Signed)
NAME: Katherine Lawrence, Katherine Lawrence MEDICAL RECORD [id number] ACCOUNT [id number] DATE OF BIRTH:03-09-1956 FACILITY: [facility] LOCATION: [id number] PHYSICIAN:Geniene List Zella Ball, MD  OPERATIVE REPORT  DATE OF PROCEDURE:  07/14/2018  PREOPERATIVE DIAGNOSIS:  Failed left total knee arthroplasty.  POSTOPERATIVE DIAGNOSIS:  Failed left total knee arthroplasty.  PROCEDURE:  Left total knee arthroplasty revision.  SURGEON:  Gaynelle Arabian, MD  ASSISTANT:  Theresa Duty, PA-C  ANESTHESIA:  Spinal and adductor canal block.  ESTIMATED BLOOD LOSS:  Minimal.  DRAIN:  Hemovac x1.  TOURNIQUET TIME:  Up 60 minutes at 300 mmHg, down 6 minutes.  Up additional 20 minutes at 300 mmHg.  COMPLICATIONS:  None.  CONDITION:  Stable to recovery.  BRIEF CLINICAL NOTE:  The patient is a 63 year old female who had a left total knee arthroplasty done many years ago, and she has had progressively worsening stiffness and pain in the knee.  Her current range of motion is approximately 25-40 degrees.   She had a recent hip replacement by me.  The hip pain is better, but dysfunction in her knee is making it very difficult for her to ambulate.  She had a bone scan suggesting loosening of the knee.  Given her significant pain, dysfunction, and stiffness,  she presents for total knee arthroplasty revision.  PROCEDURE IN DETAIL:  After successful administration of adductor canal block and spinal anesthetic, a tourniquet was placed high on the left thigh.  Left lower extremity prepped and draped in the usual sterile fashion.  Extremity was wrapped in Esmarch,  tourniquet inflated to 300 mmHg.  A midline incision was made with a 10 blade through subcutaneous tissue to the level of the extensor mechanism.  A fresh blade was used to make a medial parapatellar arthrotomy.  Her tissue was significantly scarred and  hypertrophic.  Soft tissue over the proximal medial tibia subperiosteally elevated to the joint line with a knife and into the  semimembranosus bursa with a Cobb elevator.  I then removed the scar from the undersurface of the medial retinaculum,  recreating the suprapatellar pouch and medial gutter.  She also had extremely thickened tissue that essentially had her patella stuck down to the femur.  I did a sequential release of this tissue and then excised any scar tissue that did not belong.  I  actually got it to a point with tissue releases that I was able to evert her patella and flex her knee to 90 degrees.  This was a substantial improvement in her arc of motion.  I then subluxed the tibia forward and removed the tibial polyethylene.  She  had an LCS rotating platform prosthesis.  Retractors were then placed around the tibia and had to remove some excess bone from around the edges of the tibial component to get to the component.  An oscillating saw was then used to disrupt the interface  between the tibial component and bone.  It was a stemmed component.  I also had to make a small window superiorly to disrupt some of the interface between the stem and the bone.  I was able to remove the tibial component with minimal to no bone loss.  We then prepared the proximal tibia.  I reamed up to 13 mm in the tibial canal after I removed all cement from the canal.  The extramedullary tibial alignment guide was then placed referencing proximally to medial aspect of tibial tubercle and distally  along the second metatarsal axis and tibial crest.  The block was pinned to remove  about 2 mm off the previously cut surface.  Size 2.5 was the most appropriate tibial component.  Proximal tibia was prepared to modular drill and modular drill plus stem  for the tibia.  I then broached for the 29 sleeve for rotational control.  The trial component was a 2.5 MBT revision tibia with 10 mm medial and lateral augments, a 29 sleeve and a 13 x 30 stem.  This was assembled on the back table.  The femur was then addressed.  I disrupted the interface  between the femoral component and bone, removed it easily, consistent with a loose femoral component.  Fortunately, there was no bone loss.  We accessed the femoral canal and then thoroughly  irrigated.  I reamed up to 14 mm, which had an excellent press-fit.  A 14 mm reamer was left in place to serve as our intramedullary cutting guide.  Distal femoral cutting block was placed to remove about 4 mm off the distal femur.  Given her significant  flexion contracture, I had to raise the joint line in order for her to get full extension.  Thus, I removed 4 mm off each side.  Size 3 was most appropriate femoral component.  The size 3 cutting block was placed in a +2 position, and rotation was  marked up the epicondylar axis and confirmed, but with a spacer block to create a rectangular flexion gap in 90 degrees of flexion.  Those 2 rotational markers were the same, and we pinned the block in this position.  I did not get any bone anteriorly or  with the chamfers.  Posteriorly, I was able to remove a couple of millimeters of bone medial and lateral.  The intercondylar block was placed and the intercondylar cut made for the TC3 component.  Trial was placed on the tibial side.  Again, the 2.5 MBT  revision tray, 10 mm medial and lateral augments, 29 sleeve, and 13 x 30 stem extension femoral side, size 3 TC3 femur with a 14 x 75 stem in a +2 position in 5 degrees of valgus.  The trials were placed.  The femur was not really down.  We needed to  put 4 mm distal augments medial and lateral ____ we had great fit with the femur.  I placed a 12.5 mm insert.  Full extension was achieved with excellent varus/valgus and a tiny bit of AP play.  I went to 15 mm insert, and it still allowed for full  extension with excellent varus/valgus and anterior-posterior stability throughout a full range of motion.  I was excited to be able to get full extension and also flex down to about 115 with the patella tracking normally.  I  everted the patella, and since it was a rotating mobile-bearing patella, we needed to change this as it would not mate with the trochlear groove of the femur.  I removed the patellar button and then with an oscillating saw disrupted the face between  the metal-backed patellar and bone.  Removed that.  The residual patella was then at 10 mm, but I was able to place a 38 template, drilled the lug holes, placed a trial patella, tracked normally and was stable.  We then released the tourniquet.  The  initial tourniquet time was 60 minutes.  It was down for 6 minutes.  Minimal bleeding was encountered, and that which was encountered was stopped with electrocautery.  While the tourniquet was down, the components were assembled on the back table.  Once  the components were assembled and tourniquet was down for 6 minutes, then I rewrapped the leg in Esmarch and reinflated the tourniquet to 300 mmHg.  The trials were removed and cut bone surfaces prepared with pulsatile lavage.  Cement was mixed, which  was the gentamicin-impregnated cement.  Once it was ready for implantation, it was injected into the tibial canal.  Note that we had a size 3 cement restrictor which was placed earlier in the case.  The tibial component was cemented in place, impacted  and all extruded cement removed.  On the femoral side, we cemented distally with a press-fit stem.  That was impacted and extruded cement removed.  A 50 mm trial insert was placed.  Knee held in full extension.  All extruded cement removed.  The 38  patella was also cemented in place and held with a clamp.  When the cement was fully hardened, the knee permanent 15 mm TC3 rotating platform insert was placed into the tibial tray.  The knee was reduced with outstanding stability.  Again, I was able to  achieve full extension with excellent stability throughout full range of motion.  We got her to about 110 degrees of flexion.  Further irrigation was performed, and  mini-arthrotomy was closed over a Hemovac drain with a running 0 Stratafix suture.   Flexion against gravity was 110.  A second limb of the Hemovac drain was placed subcutaneously and the subcu tissue closed with interrupted 2-0 Vicryl.  Skin was then closed with staples.  Incision was clean and dry and a bulky sterile dressing applied.   The drain was hooked to suction.  The leg was placed into a knee immobilizer, and she was subsequently awakened and transported to recovery in stable condition.  Note that a surgical assistant was a medical necessity for this procedure to do it in a safe and expeditious manner.  Surgical assistance was necessary for retraction of vital structures and for proper placement of the limb, for safe removal of the old  implant, and safe and accurate placement of the new implants.  LN/NUANCE  D:07/14/2018 T:07/14/2018 JOB:[id number]

## 2018-07-14 NOTE — Progress Notes (Signed)
PACU Nsg NOTE: upon reassessment of hemovac to left knee, noted to be no longer or unable to hold charge, MD was informed, instructed to leave in place until further notice, MD will assess hemovac after his surgery schedule.

## 2018-07-14 NOTE — Interval H&P Note (Signed)
History and Physical Interval Note:  07/14/2018 10:08 AM  Katherine Lawrence  has presented today for surgery, with the diagnosis of failed left total knee arthroplasty  The various methods of treatment have been discussed with the patient and family. After consideration of risks, benefits and other options for treatment, the patient has consented to  Procedure(s) with comments: TOTAL KNEE REVISION (Left) - 154min as a surgical intervention .  The patient's history has been reviewed, patient examined, no change in status, stable for surgery.  I have reviewed the patient's chart and labs.  Questions were answered to the patient's satisfaction.     Pilar Plate Daris Harkins

## 2018-07-14 NOTE — Anesthesia Postprocedure Evaluation (Signed)
Anesthesia Post Note  Patient: SELBY SLOVACEK  Procedure(s) Performed: TOTAL KNEE REVISION (Left Knee)     Patient location during evaluation: PACU Anesthesia Type: Spinal Level of consciousness: awake and alert Pain management: pain level controlled Vital Signs Assessment: post-procedure vital signs reviewed and stable Respiratory status: spontaneous breathing and respiratory function stable Cardiovascular status: blood pressure returned to baseline and stable Postop Assessment: spinal receding and no apparent nausea or vomiting Anesthetic complications: no    Last Vitals:  Vitals:   07/14/18 1530 07/14/18 1545  BP: 103/60 101/60  Pulse:  67  Resp:  17  Temp:  36.7 C  SpO2:  97%    Last Pain:  Vitals:   07/14/18 1545  TempSrc:   PainSc: 0-No pain      LLE Sensation: Increased;Numbness;Tingling;No pain (07/14/18 1545)   RLE Sensation: Increased;Numbness;Tingling;No pain (07/14/18 1545) L Sensory Level: S1-Sole of foot, small toes (07/14/18 1545) R Sensory Level: S1-Sole of foot, small toes (07/14/18 1545)  Audry Pili

## 2018-07-14 NOTE — Anesthesia Procedure Notes (Signed)
Anesthesia Regional Block: Adductor canal block   Pre-Anesthetic Checklist: ,, timeout performed, Correct Patient, Correct Site, Correct Laterality, Correct Procedure, Correct Position, site marked, Risks and benefits discussed,  Surgical consent,  Pre-op evaluation,  At surgeon's request and post-op pain management  Laterality: Left  Prep: chloraprep       Needles:  Injection technique: Single-shot  Needle Type: Echogenic Needle     Needle Length: 10cm  Needle Gauge: 21     Additional Needles:   Narrative:  Start time: 07/14/2018 10:54 AM End time: 07/14/2018 10:58 AM Injection made incrementally with aspirations every 5 mL.  Performed by: Personally  Anesthesiologist: Audry Pili, MD  Additional Notes: No pain on injection. No increased resistance to injection. Injection made in 5cc increments. Good needle visualization. Patient tolerated the procedure well.

## 2018-07-14 NOTE — Progress Notes (Signed)
AssistedDr. Brock with left, ultrasound guided, adductor canal block. Side rails up, monitors on throughout procedure. See vital signs in flow sheet. Tolerated Procedure well.  

## 2018-07-15 ENCOUNTER — Encounter (HOSPITAL_COMMUNITY): Payer: Self-pay | Admitting: Orthopedic Surgery

## 2018-07-15 LAB — CBC
HCT: 39.5 % (ref 36.0–46.0)
HEMOGLOBIN: 12 g/dL (ref 12.0–15.0)
MCH: 28.7 pg (ref 26.0–34.0)
MCHC: 30.4 g/dL (ref 30.0–36.0)
MCV: 94.5 fL (ref 80.0–100.0)
Platelets: 154 10*3/uL (ref 150–400)
RBC: 4.18 MIL/uL (ref 3.87–5.11)
RDW: 13.3 % (ref 11.5–15.5)
WBC: 8.8 10*3/uL (ref 4.0–10.5)
nRBC: 0 % (ref 0.0–0.2)

## 2018-07-15 LAB — GLUCOSE, CAPILLARY
GLUCOSE-CAPILLARY: 107 mg/dL — AB (ref 70–99)
Glucose-Capillary: 117 mg/dL — ABNORMAL HIGH (ref 70–99)
Glucose-Capillary: 147 mg/dL — ABNORMAL HIGH (ref 70–99)
Glucose-Capillary: 131 mg/dL — ABNORMAL HIGH (ref 70–99)

## 2018-07-15 LAB — BASIC METABOLIC PANEL
Anion gap: 5 (ref 5–15)
BUN: 13 mg/dL (ref 6–20)
CHLORIDE: 106 mmol/L (ref 98–111)
CO2: 26 mmol/L (ref 22–32)
Calcium: 8.2 mg/dL — ABNORMAL LOW (ref 8.9–10.3)
Creatinine, Ser: 0.48 mg/dL (ref 0.44–1.00)
GFR calc Af Amer: >60 mL/min (ref >60)
GFR calc non Af Amer: >60 mL/min (ref >60)
Glucose, Bld: 124 mg/dL — ABNORMAL HIGH (ref 70–99)
Potassium: 3.4 mmol/L — ABNORMAL LOW (ref 3.5–5.1)
SODIUM: 137 mmol/L (ref 135–145)

## 2018-07-15 MED ORDER — POTASSIUM CHLORIDE CRYS ER 20 MEQ PO TBCR
40.0000 meq | EXTENDED_RELEASE_TABLET | Freq: Once | ORAL | Status: AC
  Administered 2018-07-15: 40 meq via ORAL
  Filled 2018-07-15: qty 2

## 2018-07-15 NOTE — Progress Notes (Signed)
Physical Therapy Treatment Patient Details Name: Katherine Lawrence MRN: [id number] DOB: December 07, 1955 Today's Date: 07/15/2018    History of Present Illness Pt s/p L TKR revision ans with hx of DM< Bil THR and L TKR(15)    PT Comments    Pt motivated and progressing steadily with mobility.     Follow Up Recommendations  Follow surgeon's recommendation for DC plan and follow-up therapies     Equipment Recommendations  None recommended by PT    Recommendations for Other Services       Precautions / Restrictions Precautions Precautions: Knee;Fall Restrictions Weight Bearing Restrictions: No Other Position/Activity Restrictions: WBAT    Mobility  Bed Mobility Overal bed mobility: Needs Assistance Bed Mobility: Sit to Supine;Supine to Sit     Supine to sit: Min assist Sit to supine: Min assist   General bed mobility comments: cues for sequence and use of R LE to self assist  Transfers Overall transfer level: Needs assistance Equipment used: Rolling walker (2 wheeled) Transfers: Sit to/from Stand Sit to Stand: Min assist         General transfer comment: cues for LE management and use of UEs to self assist  Ambulation/Gait Ambulation/Gait assistance: Min assist Gait Distance (Feet): 140 Feet(and 20' to bathroom) Assistive device: Rolling walker (2 wheeled) Gait Pattern/deviations: Step-to pattern;Decreased step length - right;Decreased step length - left;Shuffle;Trunk flexed Gait velocity: decr   General Gait Details: cues for sequence, posture and position from [employer]             Wheelchair Mobility    Modified Rankin (Stroke Patients Only)       Balance Overall balance assessment: Mild deficits observed, not formally tested                                          Cognition Arousal/Alertness: Awake/alert Behavior During Therapy: WFL for tasks assessed/performed Overall Cognitive Status: Within Functional Limits for tasks  assessed                                        Exercises Total Joint Exercises Ankle Circles/Pumps: AROM;Both;15 reps;Supine Quad Sets: Both;10 reps;AAROM;Supine Heel Slides: AAROM;Left;15 reps;Supine Straight Leg Raises: AAROM;Left;15 reps;Supine    General Comments        Pertinent Vitals/Pain Pain Assessment: 0-10 Pain Score: 3  Pain Location: L knee Pain Descriptors / Indicators: Aching;Sore Pain Intervention(s): Limited activity within patient's tolerance;Monitored during session;Premedicated before session;Ice applied    Home Living Family/patient expects to be discharged to:: Private residence Living Arrangements: Spouse/significant other Available Help at Discharge: Family Type of Home: House Home Access: Stairs to enter Entrance Stairs-Rails: Right;Left Home Layout: One level Home Equipment: Environmental consultant - 2 wheels;Cane - single point;Bedside commode      Prior Function Level of Independence: Independent      Comments: using cane or RW as needed   PT Goals (current goals can now be found in the care plan section) Acute Rehab PT Goals Patient Stated Goal: Regain IND PT Goal Formulation: With patient Time For Goal Achievement: 07/22/18 Potential to Achieve Goals: Good Progress towards PT goals: Progressing toward goals    Frequency    7X/week      PT Plan Current plan remains appropriate    Co-evaluation  AM-PAC PT "6 Clicks" Mobility   Outcome Measure  Help needed turning from your back to your side while in a flat bed without using bedrails?: A Little Help needed moving from lying on your back to sitting on the side of a flat bed without using bedrails?: A Little Help needed moving to and from a bed to a chair (including a wheelchair)?: A Little Help needed standing up from a chair using your arms (e.g., wheelchair or bedside chair)?: A Little Help needed to walk in hospital room?: A Little Help needed climbing 3-5  steps with a railing? : A Lot 6 Click Score: 17    End of Session Equipment Utilized During Treatment: Gait belt;Left knee immobilizer Activity Tolerance: Patient tolerated treatment well Patient left: with call [place]/phone within reach;in bed Nurse Communication: Mobility status PT Visit Diagnosis: Difficulty in walking, not elsewhere classified (R26.2)     Time: 1425-1500 PT Time Calculation (min) (ACUTE ONLY): 35 min  Charges:  $Gait Training: 8-22 mins $Therapeutic Exercise: 8-22 mins                     [place] Pager [phone] Office [phone]    Jerick Khachatryan 07/15/2018, 4:30 PM

## 2018-07-15 NOTE — Progress Notes (Signed)
   Subjective: 1 Day Post-Op Procedure(s) (LRB): TOTAL KNEE REVISION (Left) Patient reports pain as mild.   Patient seen in rounds by Dr. Wynelle Link. Patient is well, and has had no acute complaints or problems. No issues overnight. Denies chest pain, SOB, or calf pain. Foley catheter removed this AM. We will start therapy today.   Objective: Vital signs in last 24 hours: Temp:  [98 F (36.7 C)-99.2 F (37.3 C)] 98.1 F (36.7 C) (01/30 0604) Pulse Rate:  [54-73] 69 (01/30 0604) Resp:  [11-24] 15 (01/30 0604) BP: (87-130)/(54-75) 119/65 (01/30 0604) SpO2:  [91 %-100 %] 97 % (01/30 0604) Weight:  [99.1 kg] 99.1 kg (01/29 1025)  Intake/Output from previous day:  Intake/Output Summary (Last 24 hours) at 07/15/2018 0802 Last data filed at 07/15/2018 0606 Gross per 24 hour  Intake 4603.8 ml  Output 1450 ml  Net 3153.8 ml     Labs: Recent Labs    07/15/18 0528  HGB 12.0   Recent Labs    07/15/18 0528  WBC 8.8  RBC 4.18  HCT 39.5  PLT 154   Recent Labs    07/15/18 0528  NA 137  K 3.4*  CL 106  CO2 26  BUN 13  CREATININE 0.48  GLUCOSE 124*  CALCIUM 8.2*    Exam: General - Patient is Alert and Oriented Extremity - Neurologically intact Neurovascular intact Sensation intact distally Dorsiflexion/Plantar flexion intact Dressing - dressing C/D/I Motor Function - intact, moving foot and toes well on exam.   Past Medical History:  Diagnosis Date  . CIN I (cervical intraepithelial neoplasia I) 1996   cryo...  cone of cervix  . DEPRESSION   . Diabetes type 2, controlled ([place])   . Endometrial polyp 07/21/2011  . Heart murmur    age 63   . HYPERCHOLESTEROLEMIA   . HYPERTENSION   . Osteoarthritis    hips and knees  . Pneumonia 06/2003  . Postsurgical menopause     Assessment/Plan: 1 Day Post-Op Procedure(s) (LRB): TOTAL KNEE REVISION (Left) Principal Problem:   Failed total knee arthroplasty ([facility]) Active Problems:   Failed total left knee replacement  ([facility])  Estimated body mass index is 37.49 kg/m as calculated from the following:   Height as of this encounter: 5\' 4"  (1.626 m).   Weight as of this encounter: 99.1 kg. Advance diet Up with therapy  DVT Prophylaxis - Aspirin Weight bearing as tolerated. D/C O2 and pulse ox and try on room air. Hemovac pulled without difficulty, will begin therapy.  Potassium dropped to 3.4 this AM, one dose of 40 mEq KCl ordered.  Plan is to go Home after hospital stay. Plan for discharge tomorrow pending progress with therapy.  Theresa Duty, PA-C Orthopedic Surgery 07/15/2018, 8:02 AM

## 2018-07-15 NOTE — Evaluation (Signed)
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  Pt should progress to dc home with family assist and has first OP PT appt set for Monday 07/19/18.    Follow Up Recommendations Follow surgeon's recommendation for DC plan and follow-up therapies    Equipment Recommendations  None recommended by PT    Recommendations for Other Services       Precautions / Restrictions Precautions Precautions: Knee;Fall Restrictions Weight Bearing Restrictions: No Other Position/Activity Restrictions: WBAT      Mobility  Bed Mobility Overal bed mobility: Needs Assistance Bed Mobility: Sit to Supine       Sit to supine: Min assist   General bed mobility comments: cues for sequence and use of R LE to self assist  Transfers Overall transfer level: Needs assistance   Transfers: Sit to/from Stand Sit to Stand: Min assist         General transfer comment: cues for LE management and use of UEs to self assist  Ambulation/Gait Ambulation/Gait assistance: Min assist Gait Distance (Feet): 90 Feet Assistive device: Rolling walker (2 wheeled) Gait Pattern/deviations: Step-to pattern;Decreased step length - right;Decreased step length - left;Shuffle;Trunk flexed Gait velocity: decr   General Gait Details: cues for sequence, posture and position from [employer]            Wheelchair Mobility    Modified Rankin (Stroke Patients Only)       Balance Overall balance assessment: Mild deficits observed, not formally tested                                           Pertinent Vitals/Pain Pain Assessment: 0-10 Pain Score: 3  Pain Location: L knee Pain Descriptors /  Indicators: Aching;Sore Pain Intervention(s): Limited activity within patient's tolerance;Monitored during session;Premedicated before session;Ice applied    Home Living Family/patient expects to be discharged to:: Private residence Living Arrangements: Spouse/significant other Available Help at Discharge: Family Type of Home: House Home Access: Stairs to enter Entrance Stairs-Rails: Psychiatric nurse of Steps: 3 Home Layout: One level Home Equipment: Environmental consultant - 2 wheels;Cane - single point;Bedside commode      Prior Function Level of Independence: Independent         Comments: using cane or RW as needed     Hand Dominance   Dominant Hand: Right    Extremity/Trunk Assessment   Upper Extremity Assessment Upper Extremity Assessment: Overall WFL for tasks assessed    Lower Extremity Assessment Lower Extremity Assessment: LLE deficits/detail LLE Deficits / Details: 3-/5 quads with AAROM at knee very limited (premorbid) -20 - 40       Communication   Communication: No difficulties  Cognition Arousal/Alertness: Awake/alert Behavior During Therapy: WFL for tasks assessed/performed Overall Cognitive Status: Within Functional Limits for tasks assessed                                        General Comments      Exercises Total Joint Exercises Ankle Circles/Pumps: AROM;Both;15 reps;Supine  Quad Sets: Both;10 reps;AAROM;Supine Heel Slides: AAROM;Left;15 reps;Supine Straight Leg Raises: AAROM;Left;15 reps;Supine   Assessment/Plan    PT Assessment Patient needs continued PT services  PT Problem List Decreased strength;Decreased range of motion;Decreased activity tolerance;Decreased mobility;Pain;Decreased knowledge of use of DME       PT Treatment Interventions DME instruction;Gait training;Stair training;Functional mobility training;Therapeutic activities;Therapeutic exercise;Patient/family education    PT Goals (Current goals can be  found in the Care Plan section)  Acute Rehab PT Goals Patient Stated Goal: Regain IND PT Goal Formulation: With patient Time For Goal Achievement: 07/22/18 Potential to Achieve Goals: Good    Frequency 7X/week   Barriers to discharge        Co-evaluation               AM-PAC PT "6 Clicks" Mobility  Outcome Measure Help needed turning from your back to your side while in a flat bed without using bedrails?: A Little Help needed moving from lying on your back to sitting on the side of a flat bed without using bedrails?: A Little Help needed moving to and from a bed to a chair (including a wheelchair)?: A Little Help needed standing up from a chair using your arms (e.g., wheelchair or bedside chair)?: A Little Help needed to walk in hospital room?: A Little Help needed climbing 3-5 steps with a railing? : A Lot 6 Click Score: 17    End of Session Equipment Utilized During Treatment: Gait belt;Left knee immobilizer Activity Tolerance: Patient tolerated treatment well Patient left: with call [place]/phone within reach;in bed Nurse Communication: Mobility status PT Visit Diagnosis: Difficulty in walking, not elsewhere classified (R26.2)    Time: 1020-1045 PT Time Calculation (min) (ACUTE ONLY): 25 min   Charges:   PT Evaluation $PT Eval Low Complexity: 1 Low PT Treatments $Therapeutic Exercise: 8-22 mins        Debe Coder PT Acute Rehabilitation Services Pager [phone] Office [phone]   Walker Paddack 07/15/2018, 4:24 PM

## 2018-07-16 LAB — CBC
HCT: 39.6 % (ref 36.0–46.0)
HEMOGLOBIN: 12 g/dL (ref 12.0–15.0)
MCH: 28.2 pg (ref 26.0–34.0)
MCHC: 30.3 g/dL (ref 30.0–36.0)
MCV: 93.2 fL (ref 80.0–100.0)
Platelets: 196 10*3/uL (ref 150–400)
RBC: 4.25 MIL/uL (ref 3.87–5.11)
RDW: 13.4 % (ref 11.5–15.5)
WBC: 10.1 10*3/uL (ref 4.0–10.5)
nRBC: 0 % (ref 0.0–0.2)

## 2018-07-16 LAB — BASIC METABOLIC PANEL
ANION GAP: 8 (ref 5–15)
BUN: 16 mg/dL (ref 6–20)
CO2: 26 mmol/L (ref 22–32)
Calcium: 8.6 mg/dL — ABNORMAL LOW (ref 8.9–10.3)
Chloride: 105 mmol/L (ref 98–111)
Creatinine, Ser: 0.56 mg/dL (ref 0.44–1.00)
GFR calc Af Amer: >60 mL/min (ref >60)
GFR calc non Af Amer: >60 mL/min (ref >60)
Glucose, Bld: 111 mg/dL — ABNORMAL HIGH (ref 70–99)
Potassium: 3.7 mmol/L (ref 3.5–5.1)
Sodium: 139 mmol/L (ref 135–145)

## 2018-07-16 LAB — GLUCOSE, CAPILLARY: GLUCOSE-CAPILLARY: 93 mg/dL (ref 70–99)

## 2018-07-16 MED ORDER — ASPIRIN 325 MG PO TBEC: 325.0000 mg | DELAYED_RELEASE_TABLET | Freq: Two times a day (BID) | ORAL | 0 refills | Status: AC

## 2018-07-16 MED ORDER — OXYCODONE HCL 5 MG PO TABS: 5.0000 mg | ORAL_TABLET | Freq: Four times a day (QID) | ORAL | 0 refills | Status: DC | PRN

## 2018-07-16 MED ORDER — METHOCARBAMOL 500 MG PO TABS: 500.0000 mg | ORAL_TABLET | Freq: Four times a day (QID) | ORAL | 0 refills | Status: DC | PRN

## 2018-07-16 NOTE — Progress Notes (Signed)
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  Pt reviewed stairs and home therex program with written instruction provided.   Follow Up Recommendations  Follow surgeon's recommendation for DC plan and follow-up therapies     Equipment Recommendations  None recommended by PT    Recommendations for Other Services OT consult     Precautions / Restrictions Precautions Precautions: Knee;Fall Restrictions Weight Bearing Restrictions: No Other Position/Activity Restrictions: WBAT    Mobility  Bed Mobility Overal bed mobility: Needs Assistance Bed Mobility: Supine to Sit     Supine to sit: Min guard     General bed mobility comments: cues for sequence and use of R LE to self assist  Transfers Overall transfer level: Needs assistance Equipment used: Rolling walker (2 wheeled) Transfers: Sit to/from Stand Sit to Stand: Min guard;Supervision         General transfer comment: cues for LE management and use of UEs to self assist  Ambulation/Gait Ambulation/Gait assistance: Min guard;Supervision Gait Distance (Feet): 150 Feet(and 20' to bathroom) Assistive device: Rolling walker (2 wheeled) Gait Pattern/deviations: Step-to pattern;Decreased step length - right;Decreased step length - left;Shuffle;Trunk flexed Gait velocity: decr   General Gait Details: cues for sequence, posture and position from RW   Stairs Stairs: Yes Stairs assistance: Min assist Stair Management: One rail Left;Step to pattern;Forwards;With cane Number of Stairs: 3 General stair comments: cues for sequence and foot/cane placement   Wheelchair Mobility    Modified Rankin (Stroke Patients Only)       Balance Overall balance assessment: Mild  deficits observed, not formally tested                                          Cognition Arousal/Alertness: Awake/alert Behavior During Therapy: WFL for tasks assessed/performed Overall Cognitive Status: Within Functional Limits for tasks assessed                                        Exercises Total Joint Exercises Ankle Circles/Pumps: AROM;Both;15 reps;Supine Quad Sets: Both;10 reps;AAROM;Supine Short Arc Quad: AROM;Both;15 reps;Supine Heel Slides: AAROM;Left;Supine;20 reps Straight Leg Raises: Left;Supine;AAROM;AROM;20 reps    General Comments        Pertinent Vitals/Pain Pain Assessment: 0-10 Pain Score: 4  Pain Location: L knee Pain Descriptors / Indicators: Aching;Sore Pain Intervention(s): Limited activity within patient's tolerance;Monitored during session;Premedicated before session;Ice applied    Home Living                      Prior Function            PT Goals (current goals can now be found in the care plan section) Acute Rehab PT Goals Patient Stated Goal: Regain IND PT Goal Formulation: With patient Time For Goal Achievement: 07/22/18 Potential to Achieve Goals: Good Progress towards PT goals: Progressing toward goals    Frequency    7X/week      PT Plan Current plan remains appropriate    Co-evaluation  AM-PAC PT "6 Clicks" Mobility   Outcome Measure  Help needed turning from your back to your side while in a flat bed without using bedrails?: A Little Help needed moving from lying on your back to sitting on the side of a flat bed without using bedrails?: A Little Help needed moving to and from a bed to a chair (including a wheelchair)?: A Little Help needed standing up from a chair using your arms (e.g., wheelchair or bedside chair)?: A Little Help needed to walk in hospital room?: A Little Help needed climbing 3-5 steps with a railing? : A Little 6 Click Score: 18    End  of Session Equipment Utilized During Treatment: Gait belt Activity Tolerance: Patient tolerated treatment well Patient left: in chair;with call [place]/phone within reach;with chair alarm set Nurse Communication: Mobility status PT Visit Diagnosis: Difficulty in walking, not elsewhere classified (R26.2)     Time: 7342-8768 PT Time Calculation (min) (ACUTE ONLY): 50 min  Charges:  $Gait Training: 8-22 mins $Therapeutic Exercise: 8-22 mins $Therapeutic Activity: 8-22 mins                     [place] Pager [phone] Office [phone]    Lolita Faulds 07/16/2018, 11:27 AM

## 2018-07-16 NOTE — Care Management Note (Signed)
Case Management Note  Patient Details  Name: Katherine Lawrence MRN: [id number] Date of Birth: 19-Aug-1955  Subjective/Objective:     Spoke with patient at bedside. Confirmed plan for OP PT, already arranged. Has RW and 3n1. [phone]               Action/Plan:   Expected Discharge Date:  07/16/18               Expected Discharge Plan:  OP Rehab  In-House Referral:  NA  Discharge planning Services  CM Consult  Post Acute Care Choice:  NA Choice offered to:  Patient  DME Arranged:  N/A DME Agency:  NA  HH Arranged:  NA HH Agency:  NA  Status of Service:  Completed, signed off  If discussed at [facility] of Stay Meetings, dates discussed:    Additional Comments:  Guadalupe Maple, RN 07/16/2018, 8:49 AM

## 2018-07-16 NOTE — Progress Notes (Signed)
   Subjective: 2 Days Post-Op Procedure(s) (LRB): TOTAL KNEE REVISION (Left) Patient reports pain as mild.   Patient seen in rounds by Dr. Wynelle Link. Patient is well, and has had no acute complaints or problems. States she is ready to go home. Voiding without difficulty and positive flatus. Denies chest pain or SOB. No issues overnight.  Plan is to go Home after hospital stay.  Objective: Vital signs in last 24 hours: Temp:  [98.1 F (36.7 C)-98.6 F (37 C)] 98.1 F (36.7 C) (01/31 0458) Pulse Rate:  [64-72] 71 (01/31 0458) Resp:  [16-17] 16 (01/31 0458) BP: (114-158)/(65-82) 122/78 (01/31 0458) SpO2:  [90 %-94 %] 90 % (01/31 0458)  Intake/Output from previous day:  Intake/Output Summary (Last 24 hours) at 07/16/2018 0707 Last data filed at 07/15/2018 1722 Gross per 24 hour  Intake 360 ml  Output 400 ml  Net -40 ml    Labs: Recent Labs    07/15/18 0528 07/16/18 0519  HGB 12.0 12.0   Recent Labs    07/15/18 0528 07/16/18 0519  WBC 8.8 10.1  RBC 4.18 4.25  HCT 39.5 39.6  PLT 154 196   Recent Labs    07/15/18 0528 07/16/18 0519  NA 137 139  K 3.4* 3.7  CL 106 105  CO2 26 26  BUN 13 16  CREATININE 0.48 0.56  GLUCOSE 124* 111*  CALCIUM 8.2* 8.6*   Exam: General - Patient is Alert and Oriented Extremity - Neurologically intact Neurovascular intact Sensation intact distally Dorsiflexion/Plantar flexion intact Dressing/Incision - clean, dry, no drainage Motor Function - intact, moving foot and toes well on exam.   Past Medical History:  Diagnosis Date  . CIN I (cervical intraepithelial neoplasia I) 1996   cryo...  cone of cervix  . DEPRESSION   . Diabetes type 2, controlled ([place])   . Endometrial polyp 07/21/2011  . Heart murmur    age 48   . HYPERCHOLESTEROLEMIA   . HYPERTENSION   . Osteoarthritis    hips and knees  . Pneumonia 06/2003  . Postsurgical menopause     Assessment/Plan: 2 Days Post-Op Procedure(s) (LRB): TOTAL KNEE REVISION  (Left) Principal Problem:   Failed total knee arthroplasty (HCC) Active Problems:   Failed total left knee replacement (HCC)  Estimated body mass index is 37.49 kg/m as calculated from the following:   Height as of this encounter: 5\' 4"  (1.626 m).   Weight as of this encounter: 99.1 kg. Up with therapy D/C IV fluids  DVT Prophylaxis - Aspirin Weight-bearing as tolerated  Plan for discharge after two sessions of therapy today. Scheduled for outpatient physical therapy at [facility]. Follow-up in the office in 2 weeks.   Theresa Duty, PA-C Orthopedic Surgery 07/16/2018, 7:07 AM

## 2018-07-19 DIAGNOSIS — M25662 Stiffness of left knee, not elsewhere classified: Secondary | ICD-10-CM | POA: Diagnosis not present

## 2018-07-19 NOTE — Discharge Summary (Signed)
Physician Discharge Summary   Patient ID: Katherine Lawrence MRN: [id number] DOB/AGE: 1955-09-13 63 y.o.  Admit date: 07/14/2018 Discharge date: 07/16/2018  Primary Diagnosis: Failed left total knee arthroplasty   Admission Diagnoses:  Past Medical History:  Diagnosis Date  . CIN I (cervical intraepithelial neoplasia I) 1996   cryo...  cone of cervix  . DEPRESSION   . Diabetes type 2, controlled ([place])   . Endometrial polyp 07/21/2011  . Heart murmur    age 37   . HYPERCHOLESTEROLEMIA   . HYPERTENSION   . Osteoarthritis    hips and knees  . Pneumonia 06/2003  . Postsurgical menopause    Discharge Diagnoses:   Principal Problem:   Failed total knee arthroplasty ([place]) Active Problems:   Failed total left knee replacement ([facility])  Estimated body mass index is 37.49 kg/m as calculated from the following:   Height as of this encounter: '5\' 4"'$  (1.626 m).   Weight as of this encounter: 99.1 kg.  Procedure:  Procedure(s) (LRB): TOTAL KNEE REVISION (Left)   Consults: None  HPI: The patient is a 63 year old female who had a left total knee arthroplasty done many years ago, and she has had progressively worsening stiffness and pain in the knee.  Her current range of motion is approximately 25-40 degrees.  She had a recent hip replacement by me.  The hip pain is better, but dysfunction in her knee is making it very difficult for her to ambulate.  She had a bone scan suggesting loosening of the knee.  Given her significant pain, dysfunction, and stiffness, she presents for total knee arthroplasty revision.  Laboratory Data: Admission on 07/14/2018, Discharged on 07/16/2018  Component Date Value Ref Range Status  . Glucose-Capillary 07/14/2018 105* 70 - 99 mg/dL Final  . Glucose-Capillary 07/14/2018 96  70 - 99 mg/dL Final  . Glucose-Capillary 07/14/2018 110* 70 - 99 mg/dL Final  . WBC 07/15/2018 8.8  4.0 - 10.5 K/uL Final  . RBC 07/15/2018 4.18  3.87 - 5.11 MIL/uL Final  . Hemoglobin  07/15/2018 12.0  12.0 - 15.0 g/dL Final  . HCT 07/15/2018 39.5  36.0 - 46.0 % Final  . MCV 07/15/2018 94.5  80.0 - 100.0 fL Final  . MCH 07/15/2018 28.7  26.0 - 34.0 pg Final  . MCHC 07/15/2018 30.4  30.0 - 36.0 g/dL Final  . RDW 07/15/2018 13.3  11.5 - 15.5 % Final  . Platelets 07/15/2018 154  150 - 400 K/uL Final  . nRBC 07/15/2018 0.0  0.0 - 0.2 % Final   Performed at [facility], [place] [address], [place], [place] [address]  . Sodium 07/15/2018 137  135 - 145 mmol/L Final  . Potassium 07/15/2018 3.4* 3.5 - 5.1 mmol/L Final  . Chloride 07/15/2018 106  98 - 111 mmol/L Final  . CO2 07/15/2018 26  22 - 32 mmol/L Final  . Glucose, Bld 07/15/2018 124* 70 - 99 mg/dL Final  . BUN 07/15/2018 13  6 - 20 mg/dL Final  . Creatinine, Ser 07/15/2018 0.48  0.44 - 1.00 mg/dL Final  . Calcium 07/15/2018 8.2* 8.9 - 10.3 mg/dL Final  . GFR calc non Af Amer 07/15/2018 >60  >60 mL/min Final  . GFR calc Af Amer 07/15/2018 >60  >60 mL/min Final  . Anion gap 07/15/2018 5  5 - 15 Final   Performed at [facility], [place] [address], [place], [place] [address]  . Glucose-Capillary 07/14/2018 162* 70 - 99 mg/dL Final  .  Glucose-Capillary 07/15/2018 107* 70 - 99 mg/dL Final  . Glucose-Capillary 07/15/2018 117* 70 - 99 mg/dL Final  . Glucose-Capillary 07/15/2018 147* 70 - 99 mg/dL Final  . WBC 07/16/2018 10.1  4.0 - 10.5 K/uL Final  . RBC 07/16/2018 4.25  3.87 - 5.11 MIL/uL Final  . Hemoglobin 07/16/2018 12.0  12.0 - 15.0 g/dL Final  . HCT 07/16/2018 39.6  36.0 - 46.0 % Final  . MCV 07/16/2018 93.2  80.0 - 100.0 fL Final  . MCH 07/16/2018 28.2  26.0 - 34.0 pg Final  . MCHC 07/16/2018 30.3  30.0 - 36.0 g/dL Final  . RDW 07/16/2018 13.4  11.5 - 15.5 % Final  . Platelets 07/16/2018 196  150 - 400 K/uL Final  . nRBC 07/16/2018 0.0  0.0 - 0.2 % Final   Performed at [facility], [place] [address], [place], [place] [address]  . Sodium 07/16/2018 139  135 - 145  mmol/L Final  . Potassium 07/16/2018 3.7  3.5 - 5.1 mmol/L Final  . Chloride 07/16/2018 105  98 - 111 mmol/L Final  . CO2 07/16/2018 26  22 - 32 mmol/L Final  . Glucose, Bld 07/16/2018 111* 70 - 99 mg/dL Final  . BUN 07/16/2018 16  6 - 20 mg/dL Final  . Creatinine, Ser 07/16/2018 0.56  0.44 - 1.00 mg/dL Final  . Calcium 07/16/2018 8.6* 8.9 - 10.3 mg/dL Final  . GFR calc non Af Amer 07/16/2018 >60  >60 mL/min Final  . GFR calc Af Amer 07/16/2018 >60  >60 mL/min Final  . Anion gap 07/16/2018 8  5 - 15 Final   Performed at [facility], [place] [address], [place], [place] [address]  . Glucose-Capillary 07/15/2018 131* 70 - 99 mg/dL Final  . Glucose-Capillary 07/16/2018 93  70 - 99 mg/dL Final  Hospital Outpatient Visit on 07/08/2018  Component Date Value Ref Range Status  . MRSA, PCR 07/08/2018 NEGATIVE  NEGATIVE Final  . Staphylococcus aureus 07/08/2018 NEGATIVE  NEGATIVE Final   Comment: (NOTE) The Xpert SA Assay (FDA approved for NASAL specimens in patients 32 years of age and older), is one component of a comprehensive surveillance program. It is not intended to diagnose infection nor to guide or monitor treatment. Performed at [facility], [place] [address], [place], [place] [address]   . aPTT 07/08/2018 25  24 - 36 seconds Final   Performed at [facility], [place] [address] [place], [place] [address]  . WBC 07/08/2018 7.3  4.0 - 10.5 K/uL Final  . RBC 07/08/2018 5.16* 3.87 - 5.11 MIL/uL Final  . Hemoglobin 07/08/2018 14.7  12.0 - 15.0 g/dL Final  . HCT 07/08/2018 48.0* 36.0 - 46.0 % Final  . MCV 07/08/2018 93.0  80.0 - 100.0 fL Final  . MCH 07/08/2018 28.5  26.0 - 34.0 pg Final  . MCHC 07/08/2018 30.6  30.0 - 36.0 g/dL Final  . RDW 07/08/2018 13.3  11.5 - 15.5 % Final  . Platelets 07/08/2018 229  150 - 400 K/uL Final  . nRBC 07/08/2018 0.0  0.0 - 0.2 % Final   Performed at [facility], [place] [address], [place], [place] [address]  . Sodium 07/08/2018 141  135 - 145 mmol/L Final  . Potassium 07/08/2018 4.7  3.5 - 5.1 mmol/L Final  . Chloride 07/08/2018 106  98 - 111 mmol/L Final  . CO2 07/08/2018 26  22 - 32 mmol/L Final  . Glucose, Bld 07/08/2018  97  70 - 99 mg/dL Final  . BUN 07/08/2018 17  6 - 20 mg/dL Final  . Creatinine, Ser 07/08/2018 0.75  0.44 - 1.00 mg/dL Final  . Calcium 07/08/2018 9.5  8.9 - 10.3 mg/dL Final  . Total Protein 07/08/2018 7.2  6.5 - 8.1 g/dL Final  . Albumin 07/08/2018 4.6  3.5 - 5.0 g/dL Final  . AST 07/08/2018 21  15 - 41 U/L Final  . ALT 07/08/2018 13  0 - 44 U/L Final  . Alkaline Phosphatase 07/08/2018 47  38 - 126 U/L Final  . Total Bilirubin 07/08/2018 1.2  0.3 - 1.2 mg/dL Final  . GFR calc non Af Amer 07/08/2018 >60  >60 mL/min Final  . GFR calc Af Amer 07/08/2018 >60  >60 mL/min Final  . Anion gap 07/08/2018 9  5 - 15 Final   Performed at [facility], [place] [address], [place], [place] [address]  . Prothrombin Time 07/08/2018 13.4  11.4 - 15.2 seconds Final  . INR 07/08/2018 1.03   Final   Performed at [place] [address] [place], [place] [address]  . ABO/RH(D) 07/08/2018 B POS   Final  . Antibody Screen 07/08/2018 NEG   Final  . Sample Expiration 07/08/2018 07/17/2018   Final  . Extend sample reason 07/08/2018    Final                   Value:NO TRANSFUSIONS OR PREGNANCY IN THE PAST 3 MONTHS Performed at [facility], [place] [address], [place], [place] [address]   . Glucose-Capillary 07/08/2018 124* 70 - 99 mg/dL Final  Lab on 06/21/2018  Component Date Value Ref Range Status  . Rhuematoid fact SerPl-aCnc 06/21/2018 <14  <14 IU/mL Final  . ANCA Screen 06/21/2018 NEGATIVE  NEGATIVE Final   Comment: ANCA Screen includes evaluation for p-ANCA, c-ANCA and atypical p-ANCA. A positive ANCA screen reflexes to titer and pattern(s), e.g., cytoplasmic pattern (c-ANCA), perinuclear pattern  (p-ANCA), or atypical p-ANCA pattern.  c-ANCA and p-ANCA are observed in vasculitis, whereas atypical p-ANCA is observed in IBD (Inflammatory Bowel Disease). Atypical p-ANCA is  detected in about 55% to 80% of patients with ulcerative colitis but only 5% to 25% of patients with Crohn's disease. .   . Anti Nuclear Antibody(ANA) 06/21/2018 NEGATIVE  NEGATIVE Final   Comment: ANA IFA is a first line screen for detecting the presence of up to approximately 150 autoantibodies in various autoimmune diseases. A negative ANA IFA result suggests an ANA-associated autoimmune disease is not present at this time, but is not definitive. If there is high clinical suspicion for Sjogren's syndrome, testing for anti-SS-A/Ro antibody should be considered. Anti-Jo-1 antibody should be considered for clinically suspected inflammatory myopathies. . AC-0: Negative . International Consensus on ANA Patterns (https://www.hernandez-brewer.com/) . For additional information, please refer to http://education.[employer].com/faq/FAQ177 (This link is being provided for informational/ educational purposes only.) .   Appointment on 05/24/2018  Component Date Value Ref Range Status  . TSH 05/24/2018 4.48  0.35 - 4.50 uIU/mL Final  . Cholesterol 05/24/2018 250* 0 - 200 mg/dL Final   ATP III Classification       Desirable:  < 200 mg/dL               Borderline High:  200 - 239 mg/dL          High:  > = 240 mg/dL  . Triglycerides 05/24/2018 145.0  0.0 - 149.0 mg/dL Final  Normal:  <150 mg/dLBorderline High:  150 - 199 mg/dL  . HDL 05/24/2018 45.30  >39.00 mg/dL Final  . VLDL 05/24/2018 29.0  0.0 - 40.0 mg/dL Final  . LDL Cholesterol 05/24/2018 176* 0 - 99 mg/dL Final  . Total CHOL/HDL Ratio 05/24/2018 6   Final                  Men          Women1/2 Average Risk     3.4          3.3Average Risk          5.0          4.42X Average Risk          9.6          7.13X Average Risk          15.0           11.0                      . NonHDL 05/24/2018 204.69   Final   NOTE:  Non-HDL goal should be 30 mg/dL higher than patient's LDL goal (i.e. LDL goal of < 70 mg/dL, would have non-HDL goal of < 100 mg/dL)  . Hgb A1c MFr Bld 05/24/2018 6.2  4.6 - 6.5 % Final   Glycemic Control Guidelines for People with Diabetes:Non Diabetic:  <6%Goal of Therapy: <7%Additional Action Suggested:  >8%   . Sodium 05/24/2018 141  135 - 145 mEq/L Final  . Potassium 05/24/2018 4.5  3.5 - 5.1 mEq/L Final  . Chloride 05/24/2018 102  96 - 112 mEq/L Final  . CO2 05/24/2018 30  19 - 32 mEq/L Final  . Glucose, Bld 05/24/2018 90  70 - 99 mg/dL Final  . BUN 05/24/2018 20  6 - 23 mg/dL Final  . Creatinine, Ser 05/24/2018 0.67  0.40 - 1.20 mg/dL Final  . Total Bilirubin 05/24/2018 0.6  0.2 - 1.2 mg/dL Final  . Alkaline Phosphatase 05/24/2018 58  39 - 117 U/L Final  . AST 05/24/2018 14  0 - 37 U/L Final  . ALT 05/24/2018 10  0 - 35 U/L Final  . Total Protein 05/24/2018 7.4  6.0 - 8.3 g/dL Final  . Albumin 05/24/2018 4.8  3.5 - 5.2 g/dL Final  . Calcium 05/24/2018 10.0  8.4 - 10.5 mg/dL Final  . GFR 05/24/2018 95.33  >60.00 mL/min Final  . WBC 05/24/2018 8.3  4.0 - 10.5 K/uL Final  . RBC 05/24/2018 5.26* 3.87 - 5.11 Mil/uL Final  . Hemoglobin 05/24/2018 15.0  12.0 - 15.0 g/dL Final  . HCT 05/24/2018 45.8  36.0 - 46.0 % Final  . MCV 05/24/2018 87.1  78.0 - 100.0 fl Final  . MCHC 05/24/2018 32.7  30.0 - 36.0 g/dL Final  . RDW 05/24/2018 14.4  11.5 - 15.5 % Final  . Platelets 05/24/2018 263.0  150.0 - 400.0 K/uL Final  . Neutrophils Relative % 05/24/2018 59.6  43.0 - 77.0 % Final  . Lymphocytes Relative 05/24/2018 29.7  12.0 - 46.0 % Final  . Monocytes Relative 05/24/2018 6.5  3.0 - 12.0 % Final  . Eosinophils Relative 05/24/2018 2.7  0.0 - 5.0 % Final  . Basophils Relative 05/24/2018 1.5  0.0 - 3.0 % Final  . Neutro Abs 05/24/2018 4.9  1.4 - 7.7 K/uL Final  . Lymphs Abs 05/24/2018 2.5  0.7 - 4.0 K/uL Final  . Monocytes  Absolute 05/24/2018  0.5  0.1 - 1.0 K/uL Final  . Eosinophils Absolute 05/24/2018 0.2  0.0 - 0.7 K/uL Final  . Basophils Absolute 05/24/2018 0.1  0.0 - 0.1 K/uL Final  . Total CK 05/24/2018 86  7 - 177 U/L Final  . CRP 05/24/2018 0.2* 0.5 - 20.0 mg/dL Final  . Sed Rate 05/24/2018 24  0 - 30 mm/hr Final     X-Rays:No results found.  EKG: Orders placed or performed during the hospital encounter of 07/08/18  . EKG 12 lead  . EKG 12 lead     Hospital Course: SULMA RUFFINO is a 63 y.o. who was admitted to [facility]. They were brought to the operating room on 07/14/2018 and underwent Procedure(s): [place].  Patient tolerated the procedure well and was later transferred to the recovery room and then to the orthopaedic floor for postoperative care. They were given PO and IV analgesics for pain control following their surgery. They were given 24 hours of postoperative antibiotics of  Anti-infectives (From admission, onward)   Start     Dose/Rate Route Frequency Ordered Stop   07/14/18 1830  ceFAZolin (ANCEF) IVPB 2g/100 mL premix     2 g 200 mL/hr over 30 Minutes Intravenous Every 6 hours 07/14/18 1613 07/15/18 0143   07/14/18 1015  ceFAZolin (ANCEF) IVPB 2g/100 mL premix     2 g 200 mL/hr over 30 Minutes Intravenous On call to O.R. 07/14/18 1000 07/14/18 1217     and started on DVT prophylaxis in the form of Aspirin.   PT and OT were ordered for total joint protocol. Discharge planning consulted to help with postop disposition and equipment needs. Patient had a good night on the evening of surgery. They started to get up OOB with therapy on POD #1. Hemovac drain was pulled without difficulty on day one. Potassium dropped to 3.4 that AM, one dose of 40 mEq KCl was ordered. Continued to work with therapy into POD #2. Pt was seen during rounds on day two and was ready to go home pending progress with therapy. Dressing was changed and the incision was clean, dry, and intact with  no drainage. Pt worked with therapy for one additional session and was meeting their goals. She was discharged to home later that day in stable condition.  Diet: Diabetic diet Activity: WBAT Follow-up: in 2 weeks Disposition: Home with outpatient therapy at [facility] Discharged Condition: stable   Discharge Instructions    Call MD / Call 911   Complete by:  As directed    If you experience chest pain or shortness of breath, CALL 911 and be transported to the hospital emergency room.  If you develope a fever above 101 F, pus (white drainage) or increased drainage or redness at the wound, or calf pain, call your surgeon's office.   Change dressing   Complete by:  As directed    Change the dressing daily with sterile 4 x 4 inch gauze dressing and apply TED hose.   Constipation Prevention   Complete by:  As directed    Drink plenty of fluids.  Prune juice may be helpful.  You may use a stool softener, such as Colace (over the counter) 100 mg twice a day.  Use MiraLax (over the counter) for constipation as needed.   Diet - low sodium heart healthy   Complete by:  As directed    Do not put a pillow under the knee. Place it under the heel.  Complete by:  As directed    Driving restrictions   Complete by:  As directed    No driving for two weeks   TED hose   Complete by:  As directed    Use stockings (TED hose) for three weeks on both leg(s).  You may remove them at night for sleeping.   Weight bearing as tolerated   Complete by:  As directed      Allergies as of 07/16/2018      Reactions   Pollen Extract    Other reaction(s): Eye redness, Eye swelling      Medication List    STOP taking these medications   ibuprofen 200 MG tablet Commonly known as:  ADVIL,MOTRIN     TAKE these medications   amLODipine 5 MG tablet Commonly known as:  NORVASC TAKE 1 TABLET(5 MG) BY MOUTH DAILY What changed:  See the new instructions.   aspirin 325 MG EC tablet Take 1 tablet (325 mg  total) by mouth 2 (two) times daily for 19 days. Then resume one 81 mg aspirin once a day. What changed:    medication strength  how much to take  when to take this  additional instructions   atorvastatin 20 MG tablet Commonly known as:  LIPITOR Take 1 tablet (20 mg total)  by mouth daily.   blood glucose meter kit and supplies Kit Dispense based on patient and insurance preference. Use up to four times daily as directed. (FOR E11.9).   citalopram 40 MG tablet Commonly known as:  CELEXA TAKE 1 TABLET(40 MG) BY MOUTH DAILY What changed:  See the new instructions.   latanoprost 0.005 % ophthalmic solution Commonly known as:  XALATAN Place 1 drop into both eyes at bedtime.   losartan 100 MG tablet Commonly known as:  COZAAR TAKE 1 TABLET(100 MG) BY MOUTH DAILY   metFORMIN 500 MG tablet Commonly known as:  GLUCOPHAGE TAKE 1 TABLET(500 MG) BY MOUTH TWICE DAILY WITH A MEAL   methocarbamol 500 MG tablet Commonly known as:  ROBAXIN Take 1 tablet (500 mg total) by mouth every 6 (six) hours as needed for muscle spasms.   oxyCODONE 5 MG immediate release tablet Commonly known as:  Oxy IR/ROXICODONE Take 1 tablet (5 mg total) by mouth every 6 (six) hours as needed for moderate pain.   vitamin B-12 100 MCG tablet Commonly known as:  CYANOCOBALAMIN Take 100 mcg by mouth daily.            Discharge Care Instructions  (From admission, onward)         Start     Ordered   07/15/18 0000  Weight bearing as tolerated     07/15/18 0805   07/15/18 0000  Change dressing    Comments:  Change the dressing daily with sterile 4 x 4 inch gauze dressing and apply TED hose.   07/15/18 0805         Follow-up Information    Gaynelle Arabian, MD. Schedule an appointment as soon as possible for a visit on 07/29/2018.   Specialty:  Orthopedic Surgery Contact information: [address] [place] [address] [phone]           Signed: Theresa Duty,  PA-C Orthopedic Surgery 07/19/2018, 8:42 AM

## 2018-07-21 DIAGNOSIS — M25662 Stiffness of left knee, not elsewhere classified: Secondary | ICD-10-CM | POA: Diagnosis not present

## 2018-07-23 DIAGNOSIS — M25662 Stiffness of left knee, not elsewhere classified: Secondary | ICD-10-CM | POA: Diagnosis not present

## 2018-07-26 DIAGNOSIS — M25662 Stiffness of left knee, not elsewhere classified: Secondary | ICD-10-CM | POA: Diagnosis not present

## 2018-07-30 DIAGNOSIS — M25662 Stiffness of left knee, not elsewhere classified: Secondary | ICD-10-CM | POA: Diagnosis not present

## 2018-08-02 DIAGNOSIS — M25662 Stiffness of left knee, not elsewhere classified: Secondary | ICD-10-CM | POA: Diagnosis not present

## 2018-08-04 DIAGNOSIS — M25662 Stiffness of left knee, not elsewhere classified: Secondary | ICD-10-CM | POA: Diagnosis not present

## 2018-08-06 DIAGNOSIS — M25662 Stiffness of left knee, not elsewhere classified: Secondary | ICD-10-CM | POA: Diagnosis not present

## 2018-08-10 DIAGNOSIS — M25662 Stiffness of left knee, not elsewhere classified: Secondary | ICD-10-CM | POA: Diagnosis not present

## 2018-08-12 DIAGNOSIS — M25662 Stiffness of left knee, not elsewhere classified: Secondary | ICD-10-CM | POA: Diagnosis not present

## 2018-08-17 DIAGNOSIS — M25662 Stiffness of left knee, not elsewhere classified: Secondary | ICD-10-CM | POA: Diagnosis not present

## 2018-08-17 DIAGNOSIS — T84039D Mechanical loosening of unspecified internal prosthetic joint, subsequent encounter: Secondary | ICD-10-CM | POA: Diagnosis not present

## 2018-08-19 DIAGNOSIS — M25662 Stiffness of left knee, not elsewhere classified: Secondary | ICD-10-CM | POA: Diagnosis not present

## 2018-08-25 ENCOUNTER — Other Ambulatory Visit: Payer: Self-pay | Admitting: Internal Medicine

## 2018-08-26 NOTE — Progress Notes (Signed)
Subjective:    Patient ID: Katherine Lawrence, female    DOB: 1955/08/14, 63 y.o.   MRN: [id number]  HPI She is here for an acute visit for cold symptoms.   Two weeks ago her nose was running.  She is not sure if that was allergies or a cold.  One week she when to [facility] in the [place]. She was there last Friday through Sunday.  She came home Sunday.  Sunday she felt tired but that has worsened.  She feels tired, has mild ear pain, runny nose, hoarseness, cough that is productive of white sputum at times, wheezing, diarrhea and lightheadedness.  She denies known fever, but has a low grade fever here today.   She denies change in appetite, chills, nasal congestion, sinus pain, ST, SOB and headaches.   Medications and allergies reviewed with patient and updated if appropriate.  Patient Active Problem List   Diagnosis Date Noted  . 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 Failed total left knee replacement ([place]) 07/14/2018  . Anxiety 11/20/2017  . Glaucoma 07/14/2017  . OA (osteoarthritis) of hip 05/20/2017  . Osteoarthritis of left hip 05/10/2017  . Memory difficulties 04/13/2015  . Type 2 diabetes mellitus without complication, without long-term current use of insulin ([place]) 04/13/2015  . Obese 03/21/2015  . 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 Osteoarthritis of left knee   . Concentration deficit   . 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 Ovarian cyst 07/21/2011  . Mild depression ([place]) 10/19/2008  . HYPERCHOLESTEROLEMIA 04/03/2008  . Essential hypertension 04/03/2008  . Myalgia 04/03/2008    Current Outpatient Medications on File Prior to Visit  Medication Sig Dispense Refill  . amLODipine (NORVASC) 5 MG tablet TAKE 1 TABLET(5 MG) BY MOUTH DAILY (Patient taking differently: Take 5 mg by mouth daily. ) 90 tablet 3  . atorvastatin (LIPITOR) 20 MG tablet TAKE 1 TABLET(20 MG) BY MOUTH DAILY 90 tablet 1  . blood glucose meter kit and supplies KIT Dispense  based on patient and insurance preference. Use up to four times daily as directed. (FOR E11.9). 1 each 0  . citalopram (CELEXA) 40 MG tablet TAKE 1 TABLET(40 MG) BY MOUTH DAILY (Patient taking differently: Take 40 mg by mouth daily. ) 90 tablet 1  . latanoprost (XALATAN) 0.005 % ophthalmic solution Place 1 drop into both eyes at bedtime.   11  . losartan (COZAAR) 100 MG tablet TAKE 1 TABLET(100 MG) BY MOUTH DAILY 90 tablet 1  . metFORMIN (GLUCOPHAGE) 500 MG tablet TAKE 1 TABLET(500 MG) BY MOUTH TWICE DAILY WITH A MEAL 180 tablet 1  . methocarbamol (ROBAXIN) 500 MG tablet Take 1 tablet (500 mg total) by mouth every 6 (six) hours as needed for muscle spasms. 40 tablet 0  . oxyCODONE (OXY IR/ROXICODONE) 5 MG immediate release tablet Take 1 tablet (5 mg total) by mouth every 6 (six) hours as needed for moderate pain. 56 tablet 0  . vitamin B-12 (CYANOCOBALAMIN) 100 MCG tablet Take 100 mcg by mouth daily.     No current facility-administered medications on file prior to visit.     Past Medical History:  Diagnosis Date  . CIN I (cervical intraepithelial neoplasia I) 1996   cryo...  cone of cervix  . DEPRESSION   . Diabetes type 2, controlled ([place])   . Endometrial polyp 07/21/2011  . Heart murmur    age 57   . HYPERCHOLESTEROLEMIA   . HYPERTENSION   .  Osteoarthritis    hips and knees  . Pneumonia 06/2003  . Postsurgical menopause     Past Surgical History:  Procedure Laterality Date  . ABDOMINAL HYSTERECTOMY  08/22/2011  . CERVICAL CONE BIOPSY  1996  . CESAREAN SECTION     x1  . COLONOSCOPY W/ POLYPECTOMY  12/2008   x3  . DILATION AND CURETTAGE OF UTERUS    . TONSILLECTOMY    . TOTAL HIP ARTHROPLASTY Right 2008  . TOTAL HIP ARTHROPLASTY Left 05/20/2017   Procedure: LEFT TOTAL HIP ARTHROPLASTY ANTERIOR APPROACH;  Surgeon: Gaynelle Arabian, MD;  Location: [facility];  Service: Orthopedics;  Laterality: Left;  . TOTAL KNEE ARTHROPLASTY Left 02/14/2014   DR Durward Fortes  . TOTAL KNEE ARTHROPLASTY  Left 02/14/2014   Procedure: TOTAL KNEE ARTHROPLASTY;  Surgeon: Garald Balding, MD;  Location: [facility];  Service: Orthopedics;  Laterality: Left;  . TOTAL KNEE REVISION Left 07/14/2018   Procedure: TOTAL KNEE REVISION;  Surgeon: Gaynelle Arabian, MD;  Location[facility];  Service: Orthopedics;  Laterality: Left;  Adductor Block    Social History   Socioeconomic History  . Marital status: Married    Spouse name: Not on file  . Number of children: 2  . Years of education: Not on file  . Highest education level: Not on file  Occupational History  . Occupation: Land: Theme park manager    Comment: works from home  Social Needs  . Financial resource strain: Not on file  . Food insecurity:    Worry: Not on file    Inability: Not on file  . Transportation needs:    Medical: Not on file    Non-medical: Not on file  Tobacco Use  . Smoking status: Former Smoker    Packs/day: 0.25    Years: 1.00    Pack years: 0.25    Types: Cigarettes    Last attempt to quit: 07/08/2010    Years since quitting: 8.1  . Smokeless tobacco: Never Used  Substance and Sexual Activity  . Alcohol use: Yes    Alcohol/week: 0.0 standard drinks    Comment: Rare  . Drug use: No  . Sexual activity: Not Currently    Comment: 1st intercourse- 14, partners- 59, married- 82 yrs   Lifestyle  . Physical activity:    Days per week: Not on file    Minutes per session: Not on file  . Stress: Not on file  Relationships  . Social connections:    Talks on phone: Not on file    Gets together: Not on file    Attends religious service: Not on file    Active member of club or organization: Not on file    Attends meetings of clubs or organizations: Not on file    Relationship status: Not on file  Other Topics Concern  . Not on file  Social History Narrative   Lives with spouse; grown kids   Pt is also a Ship broker.   Regular exercise-yes    Family History  Problem Relation Age of Onset  . Pancreatic  cancer Mother 62  . Hypertension Father   . Heart disease Father   . Diabetes Son     Review of Systems  Constitutional: Positive for fatigue. Negative for appetite change, chills and fever.  HENT: Positive for ear pain (mild), rhinorrhea and voice change. Negative for congestion, sinus pain and sore throat.   Respiratory: Positive for cough (wet, productive - dull white) and wheezing.  Negative for chest tightness and shortness of breath.   Cardiovascular: Negative for chest pain.  Gastrointestinal: Positive for diarrhea. Negative for abdominal pain and nausea.  Musculoskeletal: Negative for myalgias.  Neurological: Positive for light-headedness. Negative for headaches.       Objective:   Vitals:   08/27/18 1523  BP: 118/82  Pulse: 71  Resp: 18  Temp: 99.9 F (37.7 C)  SpO2: 96%   Filed Weights   08/27/18 1523  Weight: 205 lb (93 kg)   Body mass index is 35.19 kg/m.  Wt Readings from Last 3 Encounters:  08/27/18 205 lb (93 kg)  07/14/18 218 lb 6.4 oz (99.1 kg)  07/08/18 218 lb 6.4 oz (99.1 kg)     Physical Exam GENERAL APPEARANCE: Appears stated age, well appearing, NAD EYES: conjunctiva clear, no icterus HEENT: bilateral tympanic membranes and ear canals normal, oropharynx with no erythema, no thyromegaly, trachea midline, no cervical or supraclavicular lymphadenopathy LUNGS: unlabored breathing, good air entry bilaterally, mild end exp wheeze, no crackles CARDIOVASCULAR: Normal S1,S2 without murmurs, no edema SKIN: warm, dry        Assessment & Plan:   See Problem List for Assessment and Plan of chronic medical problems.

## 2018-08-27 ENCOUNTER — Other Ambulatory Visit: Payer: Self-pay

## 2018-08-27 ENCOUNTER — Ambulatory Visit (INDEPENDENT_AMBULATORY_CARE_PROVIDER_SITE_OTHER)
Admission: RE | Admit: 2018-08-27 | Discharge: 2018-08-27 | Disposition: A | Discharge status: Home / Self Care | Payer: BLUE CROSS/BLUE SHIELD | Source: Ambulatory Visit | Attending: Internal Medicine | Admitting: Internal Medicine

## 2018-08-27 ENCOUNTER — Other Ambulatory Visit: Payer: BLUE CROSS/BLUE SHIELD

## 2018-08-27 ENCOUNTER — Encounter: Payer: Self-pay | Admitting: Internal Medicine

## 2018-08-27 ENCOUNTER — Ambulatory Visit (INDEPENDENT_AMBULATORY_CARE_PROVIDER_SITE_OTHER): Payer: BLUE CROSS/BLUE SHIELD | Admitting: Internal Medicine

## 2018-08-27 VITALS — BP 118/82 | HR 71 | Temp 99.9°F | Resp 18 | Wt 205.0 lb

## 2018-08-27 DIAGNOSIS — R509 Fever, unspecified: Secondary | ICD-10-CM | POA: Diagnosis not present

## 2018-08-27 DIAGNOSIS — R059 Cough, unspecified: Secondary | ICD-10-CM

## 2018-08-27 DIAGNOSIS — R05 Cough: Secondary | ICD-10-CM

## 2018-08-27 DIAGNOSIS — B349 Viral infection, unspecified: Secondary | ICD-10-CM | POA: Diagnosis not present

## 2018-08-27 LAB — POCT INFLUENZA A/B
Influenza A, POC: NEGATIVE
Influenza B, POC: NEGATIVE

## 2018-08-27 NOTE — Patient Instructions (Signed)
Have chest xray today.     Your flu test was negative.   You were tested for coronavirus.    We will call you with the results.    Treat your symptoms with over the counter medications.  Call if no improvement or any questions.

## 2018-08-28 ENCOUNTER — Encounter: Payer: Self-pay | Admitting: Internal Medicine

## 2018-08-28 DIAGNOSIS — B349 Viral infection, unspecified: Secondary | ICD-10-CM | POA: Insufficient documentation

## 2018-08-28 NOTE — Assessment & Plan Note (Signed)
Symptoms c/w viral illness Flu negative Will get CXR given wheeze Travel to [place] she may at be at an increased risk - possible coronavirus - will test Symptomatic treatment She will update me with how she is feeling

## 2018-08-30 ENCOUNTER — Encounter: Payer: Self-pay | Admitting: Internal Medicine

## 2018-08-31 MED ORDER — HYDROCODONE-HOMATROPINE 5-1.5 MG/5ML PO SYRP: 5.0000 mL | ORAL_SOLUTION | Freq: Three times a day (TID) | ORAL | 0 refills | Status: DC | PRN

## 2018-08-31 NOTE — Addendum Note (Signed)
Addended by: Binnie Rail on: 08/31/2018 07:52 PM   Modules accepted: Orders

## 2018-09-01 ENCOUNTER — Encounter: Payer: Self-pay | Admitting: Internal Medicine

## 2018-09-01 MED ORDER — CEFDINIR 300 MG PO CAPS: 300.0000 mg | ORAL_CAPSULE | Freq: Two times a day (BID) | ORAL | 0 refills | Status: DC

## 2018-09-03 LAB — SARS-COV-2 RNA, QUALITATIVE REAL-TIME RT-PCR: SARS COV 2 RNA, RT PCR: NOT DETECTED

## 2018-10-01 DIAGNOSIS — M25662 Stiffness of left knee, not elsewhere classified: Secondary | ICD-10-CM | POA: Diagnosis not present

## 2018-10-04 DIAGNOSIS — M25662 Stiffness of left knee, not elsewhere classified: Secondary | ICD-10-CM | POA: Diagnosis not present

## 2018-10-06 DIAGNOSIS — M25662 Stiffness of left knee, not elsewhere classified: Secondary | ICD-10-CM | POA: Diagnosis not present

## 2018-10-08 DIAGNOSIS — M25662 Stiffness of left knee, not elsewhere classified: Secondary | ICD-10-CM | POA: Diagnosis not present

## 2018-10-11 DIAGNOSIS — M25662 Stiffness of left knee, not elsewhere classified: Secondary | ICD-10-CM | POA: Diagnosis not present

## 2018-10-13 DIAGNOSIS — M25662 Stiffness of left knee, not elsewhere classified: Secondary | ICD-10-CM | POA: Diagnosis not present

## 2018-11-30 ENCOUNTER — Other Ambulatory Visit: Payer: Self-pay | Admitting: Internal Medicine

## 2018-12-14 DIAGNOSIS — Z96652 Presence of left artificial knee joint: Secondary | ICD-10-CM | POA: Diagnosis not present

## 2018-12-14 DIAGNOSIS — M24662 Ankylosis, left knee: Secondary | ICD-10-CM | POA: Diagnosis not present

## 2018-12-15 DIAGNOSIS — M25662 Stiffness of left knee, not elsewhere classified: Secondary | ICD-10-CM | POA: Diagnosis not present

## 2018-12-16 DIAGNOSIS — M25662 Stiffness of left knee, not elsewhere classified: Secondary | ICD-10-CM | POA: Diagnosis not present

## 2018-12-21 DIAGNOSIS — M25662 Stiffness of left knee, not elsewhere classified: Secondary | ICD-10-CM | POA: Diagnosis not present

## 2018-12-23 DIAGNOSIS — M25662 Stiffness of left knee, not elsewhere classified: Secondary | ICD-10-CM | POA: Diagnosis not present

## 2018-12-24 DIAGNOSIS — M25562 Pain in left knee: Secondary | ICD-10-CM | POA: Diagnosis not present

## 2018-12-25 ENCOUNTER — Encounter: Payer: Self-pay | Admitting: Gastroenterology

## 2018-12-27 DIAGNOSIS — M25562 Pain in left knee: Secondary | ICD-10-CM | POA: Diagnosis not present

## 2018-12-29 DIAGNOSIS — M25562 Pain in left knee: Secondary | ICD-10-CM | POA: Diagnosis not present

## 2018-12-30 ENCOUNTER — Other Ambulatory Visit: Payer: Self-pay | Admitting: Internal Medicine

## 2018-12-31 DIAGNOSIS — M25562 Pain in left knee: Secondary | ICD-10-CM | POA: Diagnosis not present

## 2019-01-03 DIAGNOSIS — M25562 Pain in left knee: Secondary | ICD-10-CM | POA: Diagnosis not present

## 2019-01-05 DIAGNOSIS — M25562 Pain in left knee: Secondary | ICD-10-CM | POA: Diagnosis not present

## 2019-01-07 DIAGNOSIS — M25562 Pain in left knee: Secondary | ICD-10-CM | POA: Diagnosis not present

## 2019-01-09 ENCOUNTER — Other Ambulatory Visit: Payer: Self-pay | Admitting: Internal Medicine

## 2019-01-14 DIAGNOSIS — M25562 Pain in left knee: Secondary | ICD-10-CM | POA: Diagnosis not present

## 2019-01-20 DIAGNOSIS — Z96652 Presence of left artificial knee joint: Secondary | ICD-10-CM | POA: Diagnosis not present

## 2019-01-20 DIAGNOSIS — Z471 Aftercare following joint replacement surgery: Secondary | ICD-10-CM | POA: Diagnosis not present

## 2019-01-20 DIAGNOSIS — M25662 Stiffness of left knee, not elsewhere classified: Secondary | ICD-10-CM | POA: Diagnosis not present

## 2019-02-12 ENCOUNTER — Other Ambulatory Visit: Payer: Self-pay | Admitting: Internal Medicine

## 2019-02-14 ENCOUNTER — Encounter: Payer: Self-pay | Admitting: Gastroenterology

## 2019-02-28 NOTE — Progress Notes (Signed)
Subjective:    Patient ID: Katherine Lawrence, female    DOB: [birth year], 63 y.o.   MRN: [id number]  HPI She is here for a physical exam.   Her memory is much worse.  If she is talking to someone she can not remember what she just said.  Her sons and husband have noticed it.  Her friends have even noticed it.  She needs to go back to work, but is concerned about her memory.  She often will drive right past where she needs to go.  She frequently loses her phone, keys.  She would like this further evaluated.   Right ear - feels like she has been swimming.  No discharge.  She denies change in hearing.  No pain or change in hearing    Medications and allergies reviewed with patient and updated if appropriate.  Patient Active Problem List   Diagnosis Date Noted  . 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 Failed total left knee replacement ([place]) 07/14/2018  . Anxiety 11/20/2017  . Glaucoma 07/14/2017  . OA (osteoarthritis) of hip 05/20/2017  . Osteoarthritis of left hip 05/10/2017  . Memory difficulties 04/13/2015  . Type 2 diabetes mellitus without complication, without long-term current use of insulin ([place]) 04/13/2015  . Obese 03/21/2015  . 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 Osteoarthritis of left knee   . Concentration deficit   . 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 Ovarian cyst 07/21/2011  . Mild depression ([place]) 10/19/2008  . HYPERCHOLESTEROLEMIA 04/03/2008  . Essential hypertension 04/03/2008  . Myalgia 04/03/2008    Current Outpatient Medications on File Prior to Visit  Medication Sig Dispense Refill  . atorvastatin (LIPITOR) 20 MG tablet TAKE 1 TABLET(20 MG) BY MOUTH DAILY 90 tablet 1  . blood glucose meter kit and supplies KIT Dispense based on patient and insurance preference. Use up to four times daily as directed. (FOR E11.9). 1 each 0  . citalopram (CELEXA) 40 MG tablet Take 1 tablet (40 mg total) by mouth daily. Annual appt is due must see  provider for future refills 30 tablet 0  . latanoprost (XALATAN) 0.005 % ophthalmic solution Place 1 drop into both eyes at bedtime.   11  . losartan (COZAAR) 100 MG tablet TAKE 1 TABLET(100 MG) BY MOUTH DAILY 90 tablet 1  . metFORMIN (GLUCOPHAGE) 500 MG tablet TAKE 1 TABLET BY MOUTH TWICE DAILY WITH A MEAL 60 tablet 0  . vitamin B-12 (CYANOCOBALAMIN) 100 MCG tablet Take 100 mcg by mouth daily.     No current facility-administered medications on file prior to visit.     Past Medical History:  Diagnosis Date  . CIN I (cervical intraepithelial neoplasia I) 1996   cryo...  cone of cervix  . DEPRESSION   . Diabetes type 2, controlled ([place])   . Endometrial polyp 07/21/2011  . Heart murmur    age 53   . HYPERCHOLESTEROLEMIA   . HYPERTENSION   . Osteoarthritis    hips and knees  . Pneumonia 06/2003  . Postsurgical menopause     Past Surgical History:  Procedure Laterality Date  . ABDOMINAL HYSTERECTOMY  08/22/2011  . CERVICAL CONE BIOPSY  1996  . CESAREAN SECTION     x1  . COLONOSCOPY W/ POLYPECTOMY  12/2008   x3  . DILATION AND CURETTAGE OF UTERUS    . TONSILLECTOMY    . TOTAL HIP ARTHROPLASTY Right 2008  . TOTAL HIP ARTHROPLASTY Left 05/20/2017  Procedure: LEFT TOTAL HIP ARTHROPLASTY ANTERIOR APPROACH;  Surgeon: Gaynelle Arabian, MD;  [facility];  Service: Orthopedics;  Laterality: Left;  . TOTAL KNEE ARTHROPLASTY Left 02/14/2014   DR Durward Fortes  . TOTAL KNEE ARTHROPLASTY Left 02/14/2014   Procedure: TOTAL KNEE ARTHROPLASTY;  Surgeon: Garald Balding, MD;  Location: [facility];  Service: Orthopedics;  Laterality: Left;  . TOTAL KNEE REVISION Left 07/14/2018   Procedure: TOTAL KNEE REVISION;  Surgeon: Gaynelle Arabian, MD;  [facility];  Service: Orthopedics;  Laterality: Left;  Adductor Block    Social History   Socioeconomic History  . Marital status: Married    Spouse name: Not on file  . Number of children: 2  . Years of education: Not on file  . Highest education  level: Not on file  Occupational History  . Occupation: Land: Theme park manager    Comment: works from home  Social Needs  . Financial resource strain: Not on file  . Food insecurity    Worry: Not on file    Inability: Not on file  . Transportation needs    Medical: Not on file    Non-medical: Not on file  Tobacco Use  . Smoking status: Former Smoker    Packs/day: 0.25    Years: 1.00    Pack years: 0.25    Types: Cigarettes    Quit date: 07/08/2010    Years since quitting: 8.6  . Smokeless tobacco: Never Used  Substance and Sexual Activity  . Alcohol use: Yes    Alcohol/week: 0.0 standard drinks    Comment: Rare  . Drug use: No  . Sexual activity: Not Currently    Comment: 1st intercourse- 14, partners- 14, married- 37 yrs   Lifestyle  . Physical activity    Days per week: Not on file    Minutes per session: Not on file  . Stress: Not on file  Relationships  . Social Herbalist on phone: Not on file    Gets together: Not on file    Attends religious service: Not on file    Active member of club or organization: Not on file    Attends meetings of clubs or organizations: Not on file    Relationship status: Not on file  Other Topics Concern  . Not on file  Social History Narrative   Lives with spouse; grown kids   Pt is also a Ship broker.   Regular exercise-yes    Family History  Problem Relation Age of Onset  . Pancreatic cancer Mother 34  . Hypertension Father   . Heart disease Father   . Diabetes Son     Review of Systems  Constitutional: Negative for chills and fever.  Eyes: Negative for visual disturbance.  Respiratory: Positive for cough (allergy related) and wheezing. Negative for shortness of breath.   Cardiovascular: Negative for chest pain, palpitations and leg swelling.  Gastrointestinal: Negative for abdominal pain, blood in stool, constipation, diarrhea and nausea.       No gerd  Genitourinary: Negative for dysuria  and hematuria.  Musculoskeletal: Positive for arthralgias. Negative for back pain.  Skin: Negative for color change and rash.  Neurological: Positive for light-headedness (if stand up or walk). Negative for headaches.       Objective:   Vitals:   03/01/19 1359  BP: 118/82  Pulse: 63  Resp: 16  Temp: 98.7 F (37.1 C)  SpO2: 97%   Filed Weights  03/01/19 1359  Weight: 208 lb (94.3 kg)   Body mass index is 35.7 kg/m.  BP Readings from Last 3 Encounters:  03/01/19 118/82  08/27/18 118/82  07/16/18 122/78    Wt Readings from Last 3 Encounters:  03/01/19 208 lb (94.3 kg)  08/27/18 205 lb (93 kg)  07/14/18 218 lb 6.4 oz (99.1 kg)     Physical Exam Constitutional: She appears well-developed and well-nourished. No distress.  HENT:  Head: Normocephalic and atraumatic.  Right Ear: External ear normal. Normal ear canal and TM Left Ear: External ear normal.  Normal ear canal and TM Mouth/Throat: Oropharynx is clear and moist.  Eyes: Conjunctivae and EOM are normal.  Neck: Neck supple. No tracheal deviation present. No thyromegaly present.  No carotid bruit  Cardiovascular: Normal rate, regular rhythm and normal heart sounds.   No murmur heard.  No edema. Pulmonary/Chest: Effort normal and breath sounds normal. No respiratory distress. She has no wheezes. She has no rales.  Breast: deferred   Abdominal: Soft. She exhibits no distension. There is no tenderness.  Lymphadenopathy: She has no cervical adenopathy.  Skin: Skin is warm and dry. She is not diaphoretic.  Psychiatric: She has a normal mood and affect. Her behavior is normal.        Assessment & Plan:   Physical exam: Screening blood work ordered Immunizations   Flu vaccine today, shingrix discussed Colonoscopy   Due - schedule Mammogram  Up to date  - schedule Gyn - not up to date  - went 07/2017 Dexa   Up to date  Eye exams  Up to date  Exercise  None - due to L knee pain Weight  Encouraged weight loss   Skin   No concerns Substance abuse  none  See Problem List for Assessment and Plan of chronic medical problems.   FU in 6 months

## 2019-02-28 NOTE — Patient Instructions (Addendum)
Tests ordered today. Your results will be released to [facility] (or called to you) after review.  If any changes need to be made, you will be notified at that same time.  All other Health Maintenance issues reviewed.   All recommended immunizations and age-appropriate screenings are up-to-date or discussed.  Flu immunization administered today.    Medications reviewed and updated.  Changes include :   Decrease losartan to 50 mg daily.  Monitor your BP at home.    Your prescription(s) have been submitted to your pharmacy. Please take as directed and contact our office if you believe you are having problem(s) with the medication(s).  A referral was ordered for neurology - Dr Delice Lesch.  An MRI was ordered.    Please followup in 6 months   Health Maintenance After Age 61 After age 1, you are at a higher risk for certain long-term diseases and infections as well as injuries from falls. Falls are a major cause of broken bones and head injuries in people who are older than age 84. Getting regular preventive care can help to keep you healthy and well. Preventive care includes getting regular testing and making lifestyle changes as recommended by your health care provider. Talk with your health care provider about:  Which screenings and tests you should have. A screening is a test that checks for a disease when you have no symptoms.  A diet and exercise plan that is right for you. What should I know about screenings and tests to prevent falls? Screening and testing are the best ways to find a health problem early. Early diagnosis and treatment give you the best chance of managing medical conditions that are common after age 61. Certain conditions and lifestyle choices may make you more likely to have a fall. Your health care provider may recommend:  Regular vision checks. Poor vision and conditions such as cataracts can make you more likely to have a fall. If you wear glasses, make sure to get your  prescription updated if your vision changes.  Medicine review. Work with your health care provider to regularly review all of the medicines you are taking, including over-the-counter medicines. Ask your health care provider about any side effects that may make you more likely to have a fall. Tell your health care provider if any medicines that you take make you feel dizzy or sleepy.  Osteoporosis screening. Osteoporosis is a condition that causes the bones to get weaker. This can make the bones weak and cause them to break more easily.  Blood pressure screening. Blood pressure changes and medicines to control blood pressure can make you feel dizzy.  Strength and balance checks. Your health care provider may recommend certain tests to check your strength and balance while standing, walking, or changing positions.  Foot health exam. Foot pain and numbness, as well as not wearing proper footwear, can make you more likely to have a fall.  Depression screening. You may be more likely to have a fall if you have a fear of falling, feel emotionally low, or feel unable to do activities that you used to do.  Alcohol use screening. Using too much alcohol can affect your balance and may make you more likely to have a fall. What actions can I take to lower my risk of falls? General instructions  Talk with your health care provider about your risks for falling. Tell your health care provider if: ? You fall. Be sure to tell your health care provider about all  falls, even ones that seem minor. ? You feel dizzy, sleepy, or off-balance.  Take over-the-counter and prescription medicines only as told by your health care provider. These include any supplements.  Eat a healthy diet and maintain a healthy weight. A healthy diet includes low-fat dairy products, low-fat (lean) meats, and fiber from whole grains, beans, and lots of fruits and vegetables. Home safety  Remove any tripping hazards, such as rugs,  cords, and clutter.  Install safety equipment such as grab bars in bathrooms and safety rails on stairs.  Keep rooms and walkways well-lit. Activity   Follow a regular exercise program to stay fit. This will help you maintain your balance. Ask your health care provider what types of exercise are appropriate for you.  If you need a cane or walker, use it as recommended by your health care provider.  Wear supportive shoes that have nonskid soles. Lifestyle  Do not drink alcohol if your health care provider tells you not to drink.  If you drink alcohol, limit how much you have: ? 0-1 drink a day for women. ? 0-2 drinks a day for men.  Be aware of how much alcohol is in your drink. In the [place] one drink equals one typical bottle of beer (12 oz), one-half glass of wine (5 oz), or one shot of hard liquor (1 oz).  Do not use any products that contain nicotine or tobacco, such as cigarettes and e-cigarettes. If you need help quitting, ask your health care provider. Summary  Having a healthy lifestyle and getting preventive care can help to protect your health and wellness after age 47.  Screening and testing are the best way to find a health problem early and help you avoid having a fall. Early diagnosis and treatment give you the best chance for managing medical conditions that are more common for people who are older than age 35.  Falls are a major cause of broken bones and head injuries in people who are older than age 40. Take precautions to prevent a fall at home.  Work with your health care provider to learn what changes you can make to improve your health and wellness and to prevent falls. This information is not intended to replace advice given to you by your health care provider. Make sure you discuss any questions you have with your health care provider. Document Released: 04/15/2017 Document Revised: 09/23/2018 Document Reviewed: 04/15/2017 Elsevier Patient Education  2020  [employer].

## 2019-03-01 ENCOUNTER — Other Ambulatory Visit: Payer: Self-pay

## 2019-03-01 ENCOUNTER — Other Ambulatory Visit (INDEPENDENT_AMBULATORY_CARE_PROVIDER_SITE_OTHER): Payer: BC Managed Care – PPO

## 2019-03-01 ENCOUNTER — Encounter: Payer: Self-pay | Admitting: Internal Medicine

## 2019-03-01 ENCOUNTER — Ambulatory Visit (INDEPENDENT_AMBULATORY_CARE_PROVIDER_SITE_OTHER): Payer: BC Managed Care – PPO | Admitting: Internal Medicine

## 2019-03-01 VITALS — BP 118/82 | HR 63 | Temp 98.7°F | Resp 16 | Ht 64.0 in | Wt 208.0 lb

## 2019-03-01 DIAGNOSIS — Z0000 Encounter for general adult medical examination without abnormal findings: Secondary | ICD-10-CM | POA: Diagnosis not present

## 2019-03-01 DIAGNOSIS — Z23 Encounter for immunization: Secondary | ICD-10-CM

## 2019-03-01 DIAGNOSIS — E7800 Pure hypercholesterolemia, unspecified: Secondary | ICD-10-CM

## 2019-03-01 DIAGNOSIS — I10 Essential (primary) hypertension: Secondary | ICD-10-CM

## 2019-03-01 DIAGNOSIS — F419 Anxiety disorder, unspecified: Secondary | ICD-10-CM

## 2019-03-01 DIAGNOSIS — Z6835 Body mass index (BMI) 35.0-35.9, adult: Secondary | ICD-10-CM

## 2019-03-01 DIAGNOSIS — F32A Depression, unspecified: Secondary | ICD-10-CM

## 2019-03-01 DIAGNOSIS — E119 Type 2 diabetes mellitus without complications: Secondary | ICD-10-CM

## 2019-03-01 DIAGNOSIS — R413 Other amnesia: Secondary | ICD-10-CM

## 2019-03-01 DIAGNOSIS — F320 Major depressive disorder, single episode, mild: Secondary | ICD-10-CM

## 2019-03-01 DIAGNOSIS — R6889 Other general symptoms and signs: Secondary | ICD-10-CM | POA: Insufficient documentation

## 2019-03-01 LAB — LIPID PANEL
Cholesterol: 205 mg/dL — ABNORMAL HIGH (ref 0–200)
HDL: 49.1 mg/dL (ref >39.00)
LDL Cholesterol: 133 mg/dL — ABNORMAL HIGH (ref 0–99)
NonHDL: 155.52
Total CHOL/HDL Ratio: 4
Triglycerides: 113 mg/dL (ref 0.0–149.0)
VLDL: 22.6 mg/dL (ref 0.0–40.0)

## 2019-03-01 LAB — CBC WITH DIFFERENTIAL/PLATELET
Basophils Absolute: 0.1 10*3/uL (ref 0.0–0.1)
Basophils Relative: 1 % (ref 0.0–3.0)
Eosinophils Absolute: 0.3 10*3/uL (ref 0.0–0.7)
Eosinophils Relative: 3.3 % (ref 0.0–5.0)
HCT: 44 % (ref 36.0–46.0)
Hemoglobin: 14.4 g/dL (ref 12.0–15.0)
Lymphocytes Relative: 30.9 % (ref 12.0–46.0)
Lymphs Abs: 2.5 10*3/uL (ref 0.7–4.0)
MCHC: 32.7 g/dL (ref 30.0–36.0)
MCV: 87.5 fl (ref 78.0–100.0)
Monocytes Absolute: 0.5 10*3/uL (ref 0.1–1.0)
Monocytes Relative: 6.3 % (ref 3.0–12.0)
Neutro Abs: 4.8 10*3/uL (ref 1.4–7.7)
Neutrophils Relative %: 58.5 % (ref 43.0–77.0)
Platelets: 237 10*3/uL (ref 150.0–400.0)
RBC: 5.03 Mil/uL (ref 3.87–5.11)
RDW: 14.6 % (ref 11.5–15.5)
WBC: 8.1 10*3/uL (ref 4.0–10.5)

## 2019-03-01 LAB — COMPREHENSIVE METABOLIC PANEL
ALT: 9 U/L (ref 0–35)
AST: 13 U/L (ref 0–37)
Albumin: 4.7 g/dL (ref 3.5–5.2)
Alkaline Phosphatase: 67 U/L (ref 39–117)
BUN: 18 mg/dL (ref 6–23)
CO2: 26 mEq/L (ref 19–32)
Calcium: 9.9 mg/dL (ref 8.4–10.5)
Chloride: 104 mEq/L (ref 96–112)
Creatinine, Ser: 0.57 mg/dL (ref 0.40–1.20)
GFR: 107.81 mL/min (ref >60.00)
Glucose, Bld: 84 mg/dL (ref 70–99)
Potassium: 4.1 mEq/L (ref 3.5–5.1)
Sodium: 141 mEq/L (ref 135–145)
Total Bilirubin: 0.6 mg/dL (ref 0.2–1.2)
Total Protein: 7 g/dL (ref 6.0–8.3)

## 2019-03-01 LAB — VITAMIN B12: Vitamin B-12: 895 pg/mL (ref 211–911)

## 2019-03-01 LAB — HEMOGLOBIN A1C: Hgb A1c MFr Bld: 6 % (ref 4.6–6.5)

## 2019-03-01 LAB — TSH: TSH: 3.05 u[IU]/mL (ref 0.35–4.50)

## 2019-03-01 MED ORDER — AMLODIPINE BESYLATE 5 MG PO TABS: 5.0000 mg | ORAL_TABLET | Freq: Every day | ORAL | 1 refills | Status: DC

## 2019-03-01 MED ORDER — LOSARTAN POTASSIUM 50 MG PO TABS: 50.0000 mg | ORAL_TABLET | Freq: Every day | ORAL | 3 refills | Status: DC

## 2019-03-01 NOTE — Assessment & Plan Note (Signed)
Controlled, stable Continue current dose of medication - celexa 40 mg daily 

## 2019-03-01 NOTE — Assessment & Plan Note (Signed)
Check a1c Low sugar / carb diet Stressed regular exercise   

## 2019-03-01 NOTE — Assessment & Plan Note (Signed)
Check lipid panel,cmp ,tsh Continue daily statin Regular exercise and healthy diet encouraged  

## 2019-03-01 NOTE — Assessment & Plan Note (Signed)
BP well controlled Current regimen effective and well tolerated Continue current medications at current doses cmp  

## 2019-03-01 NOTE — Assessment & Plan Note (Signed)
Right ear - feeling of water in ear No other symptoms Exam WNL - likely allergy related - start allergy medication

## 2019-03-01 NOTE — Assessment & Plan Note (Signed)
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

## 2019-03-01 NOTE — Assessment & Plan Note (Signed)
With dm, htn Stressed regular exercise Encouraged weight loss

## 2019-03-01 NOTE — Addendum Note (Signed)
Addended by: Delice Bison E on: 03/01/2019 04:20 PM   Modules accepted: Orders

## 2019-03-03 ENCOUNTER — Encounter: Payer: Self-pay | Admitting: Internal Medicine

## 2019-03-07 ENCOUNTER — Ambulatory Visit (AMBULATORY_SURGERY_CENTER): Payer: Self-pay

## 2019-03-07 ENCOUNTER — Other Ambulatory Visit: Payer: Self-pay

## 2019-03-07 VITALS — Temp 96.2°F | Ht 64.0 in | Wt 208.6 lb

## 2019-03-07 DIAGNOSIS — Z1211 Encounter for screening for malignant neoplasm of colon: Secondary | ICD-10-CM

## 2019-03-07 MED ORDER — NA SULFATE-K SULFATE-MG SULF 17.5-3.13-1.6 GM/177ML PO SOLN: 1.0000 | Freq: Once | ORAL | 0 refills | Status: AC

## 2019-03-07 NOTE — Progress Notes (Signed)
Denies allergies to eggs or soy products. Denies complication of anesthesia or sedation. Denies use of weight loss medication. Denies use of O2.   Emmi instructions given for colonoscopy.  A 15.00 coupon for Suprep was given to the patient.  

## 2019-03-08 ENCOUNTER — Encounter: Payer: Self-pay | Admitting: Gastroenterology

## 2019-03-15 ENCOUNTER — Encounter: Payer: Self-pay | Admitting: Gynecology

## 2019-03-21 ENCOUNTER — Encounter: Payer: Self-pay | Admitting: Gastroenterology

## 2019-03-21 ENCOUNTER — Ambulatory Visit (AMBULATORY_SURGERY_CENTER): Payer: BC Managed Care – PPO | Admitting: Gastroenterology

## 2019-03-21 ENCOUNTER — Other Ambulatory Visit: Payer: Self-pay

## 2019-03-21 VITALS — BP 134/70 | HR 55 | Temp 98.0°F | Resp 22 | Ht 64.0 in | Wt 208.6 lb

## 2019-03-21 DIAGNOSIS — Z1211 Encounter for screening for malignant neoplasm of colon: Secondary | ICD-10-CM | POA: Diagnosis not present

## 2019-03-21 DIAGNOSIS — D128 Benign neoplasm of rectum: Secondary | ICD-10-CM

## 2019-03-21 DIAGNOSIS — D129 Benign neoplasm of anus and anal canal: Secondary | ICD-10-CM

## 2019-03-21 MED ORDER — SODIUM CHLORIDE 0.9 % IV SOLN: 500.0000 mL | Freq: Once | INTRAVENOUS | Status: DC

## 2019-03-21 NOTE — Progress Notes (Signed)
Called to room to assist during endoscopic procedure.  Patient ID and intended procedure confirmed with present staff. Received instructions for my participation in the procedure from the performing physician.  

## 2019-03-21 NOTE — Progress Notes (Signed)
Pt's states no medical or surgical changes since previsit or office visit.  Temp taken by KA VS taken by CW

## 2019-03-21 NOTE — Op Note (Signed)
[place] Patient Name: Katherine Lawrence Procedure Date: 03/21/2019 8:11 AM MRN: [id number] Endoscopist: Ladene Artist , MD Age: 63 Referring MD:  Date of Birth: 1955-12-10 Gender: Female Account #: [id number] Procedure:                Colonoscopy Indications:              Screening for colorectal malignant neoplasm Medicines:                Monitored Anesthesia Care Procedure:                Pre-Anesthesia Assessment:                           - Prior to the procedure, a History and Physical                            was performed, and patient medications and                            allergies were reviewed. The patient's tolerance of                            previous anesthesia was also reviewed. The risks                            and benefits of the procedure and the sedation                            options and risks were discussed with the patient.                            All questions were answered, and informed consent                            was obtained. Prior Anticoagulants: The patient has                            taken no previous anticoagulant or antiplatelet                            agents. ASA Grade Assessment: II - A patient with                            mild systemic disease. After reviewing the risks                            and benefits, the patient was deemed in                            satisfactory condition to undergo the procedure.                           After obtaining informed consent, the colonoscope  was passed under direct vision. Throughout the                            procedure, the patient's blood pressure, pulse, and                            oxygen saturations were monitored continuously. The                            LOANER 0255 was introduced through the anus and                            advanced to the the cecum, identified by                            appendiceal orifice and  ileocecal valve. The                            ileocecal valve, appendiceal orifice, and rectum                            were photographed. The quality of the bowel                            preparation was excellent. The colonoscopy was                            performed without difficulty. The patient tolerated                            the procedure well. Scope In: 8:19:39 AM Scope Out: 8:32:27 AM Scope Withdrawal Time: 0 hours 10 minutes 11 seconds  Total Procedure Duration: 0 hours 12 minutes 48 seconds  Findings:                 The perianal and digital rectal examinations were                            normal.                           A 6 mm polyp was found in the rectum. The polyp was                            sessile. The polyp was removed with a cold snare.                            Resection and retrieval were complete.                           A few small-mouthed diverticula were found in the                            left colon.  The exam was otherwise without abnormality on                            direct and retroflexion views. Complications:            No immediate complications. Estimated blood loss:                            None. Estimated Blood Loss:     Estimated blood loss: none. Impression:               - One 6 mm polyp in the rectum, removed with a cold                            snare. Resected and retrieved.                           - Mild diverticulosis in the left colon.                           - The examination was otherwise normal on direct                            and retroflexion views. Recommendation:           - Repeat colonoscopy date to be determined after                            pending pathology results are reviewed for                            surveillance based on pathology results.                           - Patient has a contact number available for                            emergencies. The  signs and symptoms of potential                            delayed complications were discussed with the                            patient. Return to normal activities tomorrow.                            Written discharge instructions were provided to the                            patient.                           - Resume previous diet.                           - Continue present medications.                           -  Await pathology results. Ladene Artist, MD 03/21/2019 8:35:58 AM This report has been signed electronically.

## 2019-03-21 NOTE — Progress Notes (Signed)
Report to PACU, RN, vss, BBS= Clear.  

## 2019-03-21 NOTE — Patient Instructions (Signed)
Information on polyps and diverticulosis given to you today.  Await pathology results.  YOU HAD AN ENDOSCOPIC PROCEDURE TODAY AT THE [facility]:   Refer to the procedure report that was given to you for any specific questions about what was found during the examination.  If the procedure report does not answer your questions, please call your gastroenterologist to clarify.  If you requested that your care partner not be given the details of your procedure findings, then the procedure report has been included in a sealed envelope for you to review at your convenience later.  YOU SHOULD EXPECT: Some feelings of bloating in the abdomen. Passage of more gas than usual.  Walking can help get rid of the air that was put into your GI tract during the procedure and reduce the bloating. If you had a lower endoscopy (such as a colonoscopy or flexible sigmoidoscopy) you may notice spotting of blood in your stool or on the toilet paper. If you underwent a bowel prep for your procedure, you may not have a normal bowel movement for a few days.  Please Note:  You might notice some irritation and congestion in your nose or some drainage.  This is from the oxygen used during your procedure.  There is no need for concern and it should clear up in a day or so.  SYMPTOMS TO REPORT IMMEDIATELY:   Following lower endoscopy (colonoscopy or flexible sigmoidoscopy):  Excessive amounts of blood in the stool  Significant tenderness or worsening of abdominal pains  Swelling of the abdomen that is new, acute  Fever of 100F or higher   For urgent or emergent issues, a gastroenterologist can be reached at any hour by calling [phone].   DIET:  We do recommend a small meal at first, but then you may proceed to your regular diet.  Drink plenty of fluids but you should avoid alcoholic beverages for 24 hours.  ACTIVITY:  You should plan to take it easy for the rest of today and you should NOT DRIVE or use  heavy machinery until tomorrow (because of the sedation medicines used during the test).    FOLLOW UP: Our staff will call the number listed on your records 48-72 hours following your procedure to check on you and address any questions or concerns that you may have regarding the information given to you following your procedure. If we do not reach you, we will leave a message.  We will attempt to reach you two times.  During this call, we will ask if you have developed any symptoms of COVID 19. If you develop any symptoms (ie: fever, flu-like symptoms, shortness of breath, cough etc.) before then, please call [phone].  If you test positive for Covid 19 in the 2 weeks post procedure, please call and report this information to us.    If any biopsies were taken you will be contacted by phone or by letter within the next 1-3 weeks.  Please call us at [phone] if you have not heard about the biopsies in 3 weeks.    SIGNATURES/CONFIDENTIALITY: You and/or your care partner have signed paperwork which will be entered into your electronic medical record.  These signatures attest to the fact that that the information above on your After Visit Summary has been reviewed and is understood.  Full responsibility of the confidentiality of this discharge information lies with you and/or your care-partner. 

## 2019-03-23 ENCOUNTER — Telehealth: Payer: Self-pay

## 2019-03-23 ENCOUNTER — Encounter: Payer: Self-pay | Admitting: Gastroenterology

## 2019-03-23 ENCOUNTER — Ambulatory Visit
Admission: RE | Admit: 2019-03-23 | Discharge: 2019-03-23 | Disposition: A | Discharge status: Home / Self Care | Payer: BC Managed Care – PPO | Source: Ambulatory Visit | Attending: Internal Medicine | Admitting: Internal Medicine

## 2019-03-23 ENCOUNTER — Other Ambulatory Visit: Payer: Self-pay

## 2019-03-23 DIAGNOSIS — I6782 Cerebral ischemia: Secondary | ICD-10-CM | POA: Diagnosis not present

## 2019-03-23 DIAGNOSIS — R413 Other amnesia: Secondary | ICD-10-CM

## 2019-03-23 NOTE — Telephone Encounter (Signed)
  Follow up Call-  Call back number 03/21/2019  Post procedure Call Back phone  # [phone]  Permission to leave phone message Yes  Some recent data might be hidden     Patient questions:  Do you have a fever, pain , or abdominal swelling? No. Pain Score  0 *  Have you tolerated food without any problems? Yes.    Have you been able to return to your normal activities? Yes.    Do you have any questions about your discharge instructions: Diet   No. Medications  No. Follow up visit  No.  Do you have questions or concerns about your Care? No.  Actions: * If pain score is 4 or above: No action needed, pain <4.  1. Have you developed a fever since your procedure? no  2.   Have you had an respiratory symptoms (SOB or cough) since your procedure? no  3.   Have you tested positive for COVID 19 since your procedure no  4.   Have you had any family members/close contacts diagnosed with the COVID 19 since your procedure?  no   If yes to any of these questions please route to Joylene John, RN and Alphonsa Gin, Therapist, sports.

## 2019-03-23 NOTE — Telephone Encounter (Signed)
No answer, left message to call back later today, B.Maleah Rabago RN. 

## 2019-04-01 DIAGNOSIS — M25562 Pain in left knee: Secondary | ICD-10-CM | POA: Diagnosis not present

## 2019-04-05 LAB — HM MAMMOGRAPHY

## 2019-04-08 ENCOUNTER — Encounter: Payer: Self-pay | Admitting: Internal Medicine

## 2019-04-08 NOTE — Progress Notes (Signed)
Outside notes received. Information abstracted. Notes sent to scan.  

## 2019-04-21 ENCOUNTER — Telehealth (INDEPENDENT_AMBULATORY_CARE_PROVIDER_SITE_OTHER): Payer: BC Managed Care – PPO | Admitting: Neurology

## 2019-04-21 ENCOUNTER — Other Ambulatory Visit: Payer: Self-pay

## 2019-04-21 ENCOUNTER — Encounter: Payer: Self-pay | Admitting: Neurology

## 2019-04-21 VITALS — Ht 64.0 in | Wt 198.0 lb

## 2019-04-21 DIAGNOSIS — R413 Other amnesia: Secondary | ICD-10-CM | POA: Diagnosis not present

## 2019-04-21 NOTE — Progress Notes (Signed)
Virtual Visit via Telephone Note The purpose of this virtual visit is to provide medical care while limiting exposure to the novel coronavirus.    Consent was obtained for phone visit:  Yes.   Answered questions that patient had about telehealth interaction:  Yes.   I discussed the limitations, risks, security and privacy concerns of performing an evaluation and management service by telephone. I also discussed with the patient that there may be a patient responsible charge related to this service. The patient expressed understanding and agreed to proceed.  Pt location: Home Physician Location: office Name of referring provider:  Binnie Rail, MD I connected with .Katherine Lawrence at patients initiation/request on 04/21/2019 at 11:00 AM EST by telephone and verified that I am speaking with the correct person using two identifiers.  Pt MRN:  [id number] Pt DOB:  [birth year]   History of Present Illness:  The patient had a telephone visit on 04/21/2019. She was last seen in the neurology clinic 10 months ago for myalgias. EMG/NCV did not show any evidence of myopathy, bloodwork no evidence of inflammation. She was seen in 2016 for memory changes. MRI brain in 04/2015 unremarkable. She reports that since her last visit, she is noticing more cognitive changes. She has noticed more hesitation during conversations, she would have to stop and think of what she was going to say. She was laid off in October and offered early retirement, she noticed toward the end that she had to write down everything she was going to say if she had to speak on conference calls. She would go blank when called on to discuss something. She has gotten lost driving, unable to recall how to get to places she has been to for years. She has left the stove on. She infrequently forgets medications. She denies missing bill payments. Sleep and mood are good. She had a knee replacement with continued pain. She denies any headaches,  dizziness, vision changes, focal numbness/tingling/weakness. She is not having as much myalgias as before. She had a repeat MRI brain without contrast done 03/2019 did not show any acute changes. 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   Laboratory Data: Lab Results  Component Value Date   TSH 3.05 03/01/2019   Lab Results  Component Value Date   VITAMINB12 895 03/01/2019    Lab Results  Component Value Date   CHOL 205 (H) 03/01/2019   HDL 49.10 03/01/2019   LDLCALC 133 (H) 03/01/2019   LDLDIRECT 157.6 11/16/2012   TRIG 113.0 03/01/2019   CHOLHDL 4 03/01/2019    History on INitial Assessment 06/21/2018: This is a pleasant 63 year old right-handed woman who I had previously seen in October 2016 for evaluation of memory changes and poor concentration. She had an MRI brain which was unremarkable. She presents today for evaluation of different symptoms of muscle pain, neck pain, and poor balance. She reports that the muscle pains started right after her hip replacement in 2012 or so. After she went home and started doing exercises, she started noticing pain in her arms. On review of records, she had been reporting severe muscle pains in her arms > legs since 2012, repeat CK levels have been normal. She describes pain as an ache that has progressively worsened over the years that she cannot lift her arms up or put anything away in overhead cabinets. She describes weakness and pain in her arms,  shoulders, and neck. She denies back pain but has the pain in her legs, causing difficulties climbing stairs. She does have knee issues as well and uses a cane, with left knee replacement surgery scheduled at the end of this month. She does not take Advil for regular pain or when she can walk, taking it only when pain is really bad or  before she cleans the house and would definitely need it after cleaning, taking 5 tablets at one  time 2-3 times a week. She denies any numbness or tingling in her extremities, but has noticed burning on the base of her left thumb, making it difficult to move. She has muscle cramps in her legs, worse at night. She denies any family history of similar symptoms. No prior falls or accidents.   She denies any headaches, dysarthria/dysphagia, bowel/bladder dysfunction. Vision is occasionally blurred, no diplopia. She reports dizziness described as a sensation of unsteadiness like she will fall. She continues to note memory issues. Her husband teases her that she repeats herself. She worked for [employer] for 36 years and all of a sudden could not remember what to do. She occasionally gets lost driving, she has to think of how to get to familiar places. She missed a bill payment 2 months ago. She is pretty good remembering medications. Mood is good, she has noticed a difference when she forgets her citalopram.     Observations/Objective:  Limited due to nature of phone visit. Patient is awake, alert, with no confusion or dysarthria noted. [employer] Mental Exam 04/21/2019  Weekday Correct 1  Current year 1  What state are we in? 1  Amount spent 1  Amount left 2  # of Animals 1  5 objects recall 2  Number series 2  Hour markers -  Time correct -  Placed X in triangle correctly -  Largest Figure -  Name of female 2  Date back to work 0  Type of work 2  State she lived in 2  Total score 17/24 (done over phone)    Assessment and Plan:   This is a pleasant 63 yo RH woman with a history of hypertension, hyperlipidemia, diabetes, depression, with worsening memory. Her SLUMS score today done over the phone was 17/24. She is reporting more difficulty with complex tasks. MRI brain no acute changes. Neuropsychological evaluation will be ordered to further evaluate memory concerns. We discussed medications such as Donepezil, including side effects and expectations, she would like wait until  after Neuropsych testing. 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 Strokes in this region are usually due to small vessel disease, we discussed continuation of daily aspirin and control of vascular risk factors, goal LDL <70. She will discuss increasing Lipitor dose to 40mg  daily. Follow-up after Neuropsych testing, she knows to call for any changes.   Follow Up Instructions:   -I discussed the assessment and treatment plan with the patient. The patient was provided an opportunity to ask questions and all were answered. The patient agreed with the plan and demonstrated an understanding of the instructions.   The patient was advised to call back or seek an in-person evaluation if the symptoms worsen or if the condition fails to improve as anticipated.    Cameron Sprang, MD

## 2019-04-26 ENCOUNTER — Other Ambulatory Visit: Payer: Self-pay | Admitting: Internal Medicine

## 2019-04-26 DIAGNOSIS — E7800 Pure hypercholesterolemia, unspecified: Secondary | ICD-10-CM

## 2019-04-26 NOTE — Telephone Encounter (Signed)
She just saw neuro and it was recommended to increase her lipitor to 40 mg daily - is she ok with that?    If so, please send 40 mg in (pending)   She will need repeat labs in about 6 weeks to recheck her cholesterol and liver test. (pending)

## 2019-04-27 MED ORDER — ATORVASTATIN CALCIUM 40 MG PO TABS: 40.0000 mg | ORAL_TABLET | Freq: Every day | ORAL | 1 refills | Status: DC

## 2019-04-27 NOTE — Telephone Encounter (Signed)
Pt aware of response below and expressed understanding. Will do the 40 mg of atorvastatin.

## 2019-05-17 DIAGNOSIS — H11153 Pinguecula, bilateral: Secondary | ICD-10-CM | POA: Diagnosis not present

## 2019-05-17 DIAGNOSIS — H2513 Age-related nuclear cataract, bilateral: Secondary | ICD-10-CM | POA: Diagnosis not present

## 2019-05-17 DIAGNOSIS — H40053 Ocular hypertension, bilateral: Secondary | ICD-10-CM | POA: Diagnosis not present

## 2019-05-17 DIAGNOSIS — H04123 Dry eye syndrome of bilateral lacrimal glands: Secondary | ICD-10-CM | POA: Diagnosis not present

## 2019-05-17 LAB — HM DIABETES EYE EXAM

## 2019-05-31 ENCOUNTER — Ambulatory Visit: Payer: BC Managed Care – PPO | Admitting: Internal Medicine

## 2019-05-31 ENCOUNTER — Other Ambulatory Visit: Payer: Self-pay

## 2019-06-01 ENCOUNTER — Ambulatory Visit (INDEPENDENT_AMBULATORY_CARE_PROVIDER_SITE_OTHER): Payer: BC Managed Care – PPO | Admitting: Internal Medicine

## 2019-06-01 ENCOUNTER — Encounter: Payer: Self-pay | Admitting: Internal Medicine

## 2019-06-01 DIAGNOSIS — M79646 Pain in unspecified finger(s): Secondary | ICD-10-CM | POA: Diagnosis not present

## 2019-06-01 HISTORY — DX: Pain in unspecified finger(s): M79.646

## 2019-06-01 MED ORDER — PREDNISONE 20 MG PO TABS: 40.0000 mg | ORAL_TABLET | Freq: Every day | ORAL | 0 refills | Status: AC

## 2019-06-01 MED ORDER — TIZANIDINE HCL 2 MG PO TABS: 2.0000 mg | ORAL_TABLET | Freq: Two times a day (BID) | ORAL | 0 refills | Status: DC | PRN

## 2019-06-01 NOTE — Progress Notes (Signed)
Virtual Visit via Video Note  I connected with Katherine Lawrence on 06/01/19 at 10:40 AM EST by a video enabled telemedicine application and verified that I am speaking with the correct person using two identifiers.  The patient and the provider were at separate locations throughout the entire encounter.   I discussed the limitations of evaluation and management by telemedicine and the availability of in person appointments. The patient expressed understanding and agreed to proceed. The patient and the provider were the only parties present for the visit unless noted in HPI below.  History of Present Illness: The patient is a 63 y.o. female with visit for pain in the thumbs. Left more than right but both hurting. Started about 2 weeks ago with pain 8/10. Tried tylenol and ibuprofen and aleve without relief. Has no fevers or chills. Denies injury. Worked on computers for many years. Does have a lot of arthritis other places. Overall it is stable but not improving.   Observations/Objective: Appearance: normal, breathing appears normal, casual grooming, abdomen does not appear distended, throat normal, thumbs with some prominence of the MCP joint, pain in the MCP joint bilateral without redness or joint swelling evident, memory not assessed, mental status is A and O times 3  Assessment and Plan: See problem oriented charting  Follow Up Instructions: rx prednisone and tizanidine.   I discussed the assessment and treatment plan with the patient. The patient was provided an opportunity to ask questions and all were answered. The patient agreed with the plan and demonstrated an understanding of the instructions.   The patient was advised to call back or seek an in-person evaluation if the symptoms worsen or if the condition fails to improve as anticipated.  Hoyt Koch, MD

## 2019-06-01 NOTE — Assessment & Plan Note (Signed)
Rx prednisone and tizanidine. If no improvement she will call her orthopedic doctor.

## 2019-06-12 DIAGNOSIS — B029 Zoster without complications: Secondary | ICD-10-CM | POA: Diagnosis not present

## 2019-06-13 DIAGNOSIS — B0239 Other herpes zoster eye disease: Secondary | ICD-10-CM | POA: Diagnosis not present

## 2019-06-13 DIAGNOSIS — H11153 Pinguecula, bilateral: Secondary | ICD-10-CM | POA: Diagnosis not present

## 2019-06-13 DIAGNOSIS — H40053 Ocular hypertension, bilateral: Secondary | ICD-10-CM | POA: Diagnosis not present

## 2019-06-13 DIAGNOSIS — E119 Type 2 diabetes mellitus without complications: Secondary | ICD-10-CM | POA: Diagnosis not present

## 2019-06-14 ENCOUNTER — Other Ambulatory Visit: Payer: Self-pay | Admitting: Internal Medicine

## 2019-06-28 ENCOUNTER — Other Ambulatory Visit: Payer: Self-pay | Admitting: Internal Medicine

## 2019-07-08 ENCOUNTER — Other Ambulatory Visit: Payer: Self-pay | Admitting: Internal Medicine

## 2019-07-13 ENCOUNTER — Other Ambulatory Visit: Payer: Self-pay | Admitting: Internal Medicine

## 2019-07-14 DIAGNOSIS — Z471 Aftercare following joint replacement surgery: Secondary | ICD-10-CM | POA: Diagnosis not present

## 2019-07-14 DIAGNOSIS — Z96652 Presence of left artificial knee joint: Secondary | ICD-10-CM | POA: Diagnosis not present

## 2019-07-17 ENCOUNTER — Encounter: Payer: Self-pay | Admitting: Internal Medicine

## 2019-07-18 DIAGNOSIS — B0239 Other herpes zoster eye disease: Secondary | ICD-10-CM | POA: Diagnosis not present

## 2019-07-18 MED ORDER — CITALOPRAM HYDROBROMIDE 40 MG PO TABS: 40.0000 mg | ORAL_TABLET | Freq: Every day | ORAL | 0 refills | Status: DC

## 2019-08-04 NOTE — Patient Instructions (Addendum)
DUE TO COVID-19 ONLY ONE VISITOR IS ALLOWED TO COME WITH YOU AND STAY IN THE WAITING ROOM ONLY DURING PRE OP AND PROCEDURE DAY OF SURGERY. THE 1 VISITOR MAY VISIT WITH YOU AFTER SURGERY IN YOUR PRIVATE ROOM DURING VISITING HOURS ONLY!  YOU NEED TO HAVE A COVID 19 TEST ON:08/11/19 @  8:45 am   , THIS TEST MUST BE DONE BEFORE SURGERY, COME  [place], [address] , [id number].  ([place]) ONCE YOUR COVID TEST IS COMPLETED, PLEASE BEGIN THE QUARANTINE INSTRUCTIONS AS OUTLINED IN YOUR HANDOUT.                Katherine Lawrence    Your procedure is scheduled on: 08/15/19    Report to [facility] Main  Entrance   Report to admitting at: 12:50 PM     Call this number if you have problems the morning of surgery [phone]    Remember:    [name], NO [place].     Take these medicines the morning of surgery with A SIP OF WATER: AMLODIPINE  How to Manage Your Diabetes Before and After Surgery  Why is it important to control my blood sugar before and after surgery? . Improving blood sugar levels before and after surgery helps healing and can limit problems. . A way of improving blood sugar control is eating a healthy diet by: o  Eating less sugar and carbohydrates o  Increasing activity/exercise o  Talking with your doctor about reaching your blood sugar goals . High blood sugars (greater than 180 mg/dL) can raise your risk of infections and slow your recovery, so you will need to focus on controlling your diabetes during the weeks before surgery. . Make sure that the doctor who takes care of your diabetes knows about your planned surgery including the date and location.  How do I manage my blood sugar before surgery? . Check your blood sugar at least 4 times a day, starting 2 days before surgery, to make sure that the level is not too high or low. o Check your blood sugar the morning of your  surgery when you wake up and every 2 hours until you get to the Short Stay unit. . If your blood sugar is less than 70 mg/dL, you will need to treat for low blood sugar: o Do not take insulin. o Treat a low blood sugar (less than 70 mg/dL) with  cup of clear juice (cranberry or apple), 4 glucose tablets, OR glucose gel. o Recheck blood sugar in 15 minutes after treatment (to make sure it is greater than 70 mg/dL). If your blood sugar is not greater than 70 mg/dL on recheck, call [phone] for further instructions. . Report your blood sugar to the short stay nurse when you get to Short Stay.  . If you are admitted to the hospital after surgery: o Your blood sugar will be checked by the staff and you will probably be given insulin after surgery (instead of oral diabetes medicines) to make sure you have good blood sugar levels. o The goal for blood sugar control after surgery is 80-180 mg/dL.   WHAT DO I DO ABOUT MY DIABETES MEDICATION?  Marland Kitchen Do not take oral diabetes medicines (pills) the morning of surgery.  . THE DAY BEFORE SURGERY, take  METFORMIN AS USUAL     THE MORNING OF SURGERY: DO NOT TAKE METFORMIN  DO NOT TAKE ANY DIABETIC MEDICATIONS DAY OF YOUR SURGERY                               You may not have any metal on your body including hair pins and              piercings  Do not wear jewelry, lotions, powders or perfumes, deodorant             Men may shave face and neck.   Do not bring valuables to the hospital. [place].  Contacts, dentures or bridgework may not be worn into surgery.  Leave suitcase in the car. After surgery it may be brought to your room.     Patients discharged the day of surgery will not be allowed to drive home. IF YOU ARE HAVING SURGERY AND GOING HOME THE SAME DAY, YOU MUST HAVE AN ADULT TO DRIVE YOU HOME AND BE WITH YOU FOR 24 HOURS. YOU MAY GO HOME BY TAXI OR UBER OR ORTHERWISE, BUT AN ADULT MUST  ACCOMPANY YOU HOME AND STAY WITH YOU FOR 24 HOURS.  Name and phone number of your driver:  Special Instructions: N/A              Please read over the following fact sheets you were given: _____________________________________________________________________             NO SOLID FOOD AFTER MIDNIGHT THE NIGHT PRIOR TO SURGERY. NOTHING BY MOUTH EXCEPT CLEAR LIQUIDS UNTIL: 11:50 AM .   CLEAR LIQUID DIET   Foods Allowed                                                                     Foods Excluded  Coffee and tea, regular and decaf                             liquids that you cannot  Plain Jell-O any favor except red or purple                                           see through such as: Fruit ices (not with fruit pulp)                                     milk, soups, orange juice  Iced Popsicles                                    All solid food Carbonated beverages, regular and diet                                    Cranberry, grape and apple juices Sports drinks like Gatorade Lightly seasoned clear  broth or consume(fat free) Sugar, honey syrup  Sample Menu Breakfast                                Lunch                                     Supper Cranberry juice                    Beef broth                            Chicken broth Jell-O                                     Grape juice                           Apple juice Coffee or tea                        Jell-O                                      Popsicle                                                Coffee or tea                        Coffee or tea  _____________________________________________________________________  [facility] - Preparing for Surgery Before surgery, you can play an important role.  Because skin is not sterile, your skin needs to be as free of germs as possible.  You can reduce the number of germs on your skin by washing with CHG (chlorahexidine gluconate) soap before surgery.  CHG is an antiseptic  cleaner which kills germs and bonds with the skin to continue killing germs even after washing. Please DO NOT use if you have an allergy to CHG or antibacterial soaps.  If your skin becomes reddened/irritated stop using the CHG and inform your nurse when you arrive at Short Stay. Do not shave (including legs and underarms) for at least 48 hours prior to the first CHG shower.  You may shave your face/neck. Please follow these instructions carefully:  1.  Shower with CHG Soap the night before surgery and the  morning of Surgery.  2.  If you choose to wash your hair, wash your hair first as usual with your  normal  shampoo.  3.  After you shampoo, rinse your hair and body thoroughly to remove the  shampoo.                           4.  Use CHG as you would any other liquid soap.  You can apply chg directly  to the skin and wash  Gently with a scrungie or clean washcloth.  5.  Apply the CHG Soap to your body ONLY FROM THE NECK DOWN.   Do not use on face/ open                           Wound or open sores. Avoid contact with eyes, ears mouth and genitals (private parts).                       Wash face,  Genitals (private parts) with your normal soap.             6.  Wash thoroughly, paying special attention to the area where your surgery  will be performed.  7.  Thoroughly rinse your body with warm water from the neck down.  8.  DO NOT shower/wash with your normal soap after using and rinsing off  the CHG Soap.                9.  Pat yourself dry with a clean towel.            10.  Wear clean pajamas.            11.  Place clean sheets on your bed the night of your first shower and do not  sleep with pets. Day of Surgery : Do not apply any lotions/deodorants the morning of surgery.  Please wear clean clothes to the hospital/surgery center.  FAILURE TO FOLLOW THESE INSTRUCTIONS MAY RESULT IN THE CANCELLATION OF YOUR SURGERY PATIENT  SIGNATURE_________________________________  NURSE SIGNATURE__________________________________  ________________________________________________________________________   [name]  An incentive spirometer is a tool that can help keep your lungs clear and active. This tool measures how well you are filling your lungs with each breath. Taking long deep breaths may help reverse or decrease the chance of developing breathing (pulmonary) problems (especially infection) following:  A long period of time when you are unable to move or be active. BEFORE THE PROCEDURE   If the spirometer includes an indicator to show your best effort, your nurse or respiratory therapist will set it to a desired goal.  If possible, sit up straight or lean slightly forward. Try not to slouch.  Hold the incentive spirometer in an upright position. INSTRUCTIONS FOR USE  1. Sit on the edge of your bed if possible, or sit up as far as you can in bed or on a chair. 2. Hold the incentive spirometer in an upright position. 3. Breathe out normally. 4. Place the mouthpiece in your mouth and seal your lips tightly around it. 5. Breathe in slowly and as deeply as possible, raising the piston or the ball toward the top of the column. 6. Hold your breath for 3-5 seconds or for as long as possible. Allow the piston or ball to fall to the bottom of the column. 7. Remove the mouthpiece from your mouth and breathe out normally. 8. Rest for a few seconds and repeat Steps 1 through 7 at least 10 times every 1-2 hours when you are awake. Take your time and take a few normal breaths between deep breaths. 9. The spirometer may include an indicator to show your best effort. Use the indicator as a goal to work toward during each repetition. 10. After each set of 10 deep breaths, practice coughing to be sure your lungs are clear. If you have an incision (the cut made at the time of surgery),  support your incision when coughing  by placing a pillow or rolled up towels firmly against it. Once you are able to get out of bed, walk around indoors and cough well. You may stop using the incentive spirometer when instructed by your caregiver.  RISKS AND COMPLICATIONS  Take your time so you do not get dizzy or light-headed.  If you are in pain, you may need to take or ask for pain medication before doing incentive spirometry. It is harder to take a deep breath if you are having pain. AFTER USE  Rest and breathe slowly and easily.  It can be helpful to keep track of a log of your progress. Your caregiver can provide you with a simple table to help with this. If you are using the spirometer at home, follow these instructions: [place] IF:   You are having difficultly using the spirometer.  You have trouble using the spirometer as often as instructed.  Your pain medication is not giving enough relief while using the spirometer.  You develop fever of 100.5 F (38.1 C) or higher. SEEK IMMEDIATE MEDICAL CARE IF:   You cough up bloody sputum that had not been present before.  You develop fever of 102 F (38.9 C) or greater.  You develop worsening pain at or near the incision site. MAKE SURE YOU:   Understand these instructions.  Will watch your condition.  Will get help right away if you are not doing well or get worse. Document Released: 10/13/2006 Document Revised: 08/25/2011 Document Reviewed: 12/14/2006 [facility] Patient Information 2014 [place], [place].   ________________________________________________________________________

## 2019-08-05 ENCOUNTER — Other Ambulatory Visit: Payer: Self-pay

## 2019-08-05 ENCOUNTER — Emergency Department (HOSPITAL_COMMUNITY): Payer: BC Managed Care – PPO

## 2019-08-05 ENCOUNTER — Emergency Department (HOSPITAL_COMMUNITY)
Admission: EM | Admit: 2019-08-05 | Discharge: 2019-08-05 | Disposition: A | Discharge status: Home / Self Care | Payer: BC Managed Care – PPO | Attending: Emergency Medicine | Admitting: Emergency Medicine

## 2019-08-05 ENCOUNTER — Telehealth: Payer: Self-pay | Admitting: Internal Medicine

## 2019-08-05 DIAGNOSIS — Z7984 Long term (current) use of oral hypoglycemic drugs: Secondary | ICD-10-CM | POA: Diagnosis not present

## 2019-08-05 DIAGNOSIS — R002 Palpitations: Secondary | ICD-10-CM

## 2019-08-05 DIAGNOSIS — R42 Dizziness and giddiness: Secondary | ICD-10-CM | POA: Insufficient documentation

## 2019-08-05 DIAGNOSIS — R0602 Shortness of breath: Secondary | ICD-10-CM | POA: Diagnosis not present

## 2019-08-05 DIAGNOSIS — E119 Type 2 diabetes mellitus without complications: Secondary | ICD-10-CM | POA: Diagnosis not present

## 2019-08-05 DIAGNOSIS — I10 Essential (primary) hypertension: Secondary | ICD-10-CM | POA: Diagnosis not present

## 2019-08-05 HISTORY — DX: Palpitations: R00.2

## 2019-08-05 LAB — COMPREHENSIVE METABOLIC PANEL
ALT: 13 U/L (ref 0–44)
AST: 15 U/L (ref 15–41)
Albumin: 4.3 g/dL (ref 3.5–5.0)
Alkaline Phosphatase: 51 U/L (ref 38–126)
Anion gap: 9 (ref 5–15)
BUN: 17 mg/dL (ref 8–23)
CO2: 27 mmol/L (ref 22–32)
Calcium: 8.7 mg/dL — ABNORMAL LOW (ref 8.9–10.3)
Chloride: 105 mmol/L (ref 98–111)
Creatinine, Ser: 0.45 mg/dL (ref 0.44–1.00)
GFR calc Af Amer: >60 mL/min (ref >60)
GFR calc non Af Amer: >60 mL/min (ref >60)
Glucose, Bld: 125 mg/dL — ABNORMAL HIGH (ref 70–99)
Potassium: 4.3 mmol/L (ref 3.5–5.1)
Sodium: 141 mmol/L (ref 135–145)
Total Bilirubin: 0.7 mg/dL (ref 0.3–1.2)
Total Protein: 7 g/dL (ref 6.5–8.1)

## 2019-08-05 LAB — CBC WITH DIFFERENTIAL/PLATELET
Abs Immature Granulocytes: 0.01 10*3/uL (ref 0.00–0.07)
Basophils Absolute: 0 10*3/uL (ref 0.0–0.1)
Basophils Relative: 0 %
Eosinophils Absolute: 0.1 10*3/uL (ref 0.0–0.5)
Eosinophils Relative: 2 %
HCT: 45.8 % (ref 36.0–46.0)
Hemoglobin: 14.4 g/dL (ref 12.0–15.0)
Immature Granulocytes: 0 %
Lymphocytes Relative: 20 %
Lymphs Abs: 1.2 10*3/uL (ref 0.7–4.0)
MCH: 29.7 pg (ref 26.0–34.0)
MCHC: 31.4 g/dL (ref 30.0–36.0)
MCV: 94.4 fL (ref 80.0–100.0)
Monocytes Absolute: 0.3 10*3/uL (ref 0.1–1.0)
Monocytes Relative: 5 %
Neutro Abs: 4.2 10*3/uL (ref 1.7–7.7)
Neutrophils Relative %: 73 %
Platelets: 196 10*3/uL (ref 150–400)
RBC: 4.85 MIL/uL (ref 3.87–5.11)
RDW: 13.3 % (ref 11.5–15.5)
WBC: 5.8 10*3/uL (ref 4.0–10.5)
nRBC: 0 % (ref 0.0–0.2)

## 2019-08-05 LAB — TROPONIN I (HIGH SENSITIVITY)
Troponin I (High Sensitivity): 4 ng/L (ref <18)
Troponin I (High Sensitivity): 4 ng/L (ref <18)

## 2019-08-05 LAB — CBG MONITORING, ED: Glucose-Capillary: 106 mg/dL — ABNORMAL HIGH (ref 70–99)

## 2019-08-05 MED ORDER — SODIUM CHLORIDE 0.9 % IV SOLN: INTRAVENOUS | Status: DC

## 2019-08-05 NOTE — ED Provider Notes (Signed)
[place] DEPT Provider Note   CSN: [id number] Arrival date & time: 08/05/19  1000     History Chief Complaint  Patient presents with  . Near Syncope    Katherine Lawrence is a 64 y.o. female.  64 year old female who presents with intermittent palpitations x1 day.  Has had 2 episodes where she became dizzy and felt her heart racing.  Denies any associated chest pain or chest pressure.  She was not short of breath.  She did not become diaphoretic.  No prior history of same.  No new medications or stimulants use.  Symptoms resolved spontaneously.  No recent history of volume loss.        Past Medical History:  Diagnosis Date  . Allergy   . CIN I (cervical intraepithelial neoplasia I) 1996   cryo...  cone of cervix  . DEPRESSION   . Diabetes type 2, controlled ([place])   . Endometrial polyp 07/21/2011  . Glaucoma   . Heart murmur    age 37   . HYPERCHOLESTEROLEMIA   . HYPERTENSION   . Osteoarthritis    hips and knees  . Pneumonia 06/2003  . Postsurgical menopause     Patient Active Problem List   Diagnosis Date Noted  . Thumb pain 06/01/2019  . Ear symptom 03/01/2019  . 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 Anxiety 11/20/2017  . Glaucoma 07/14/2017  . OA (osteoarthritis) of hip 05/20/2017  . Osteoarthritis of left hip 05/10/2017  . Memory difficulties 04/13/2015  . Type 2 diabetes mellitus without complication, without long-term current use of insulin ([place]) 04/13/2015  . Obese 03/21/2015  . 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 Osteoarthritis of left knee   . Concentration deficit   . 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 Ovarian cyst 07/21/2011  . Mild depression ([place]) 10/19/2008  . HYPERCHOLESTEROLEMIA 04/03/2008  . Essential hypertension 04/03/2008  . Myalgia 04/03/2008    Past Surgical History:  Procedure Laterality Date  . ABDOMINAL HYSTERECTOMY   08/22/2011  . CERVICAL CONE BIOPSY  1996  . CESAREAN SECTION     x1  . COLONOSCOPY W/ POLYPECTOMY  12/2008   x3  . DILATION AND CURETTAGE OF UTERUS    . TONSILLECTOMY    . TOTAL HIP ARTHROPLASTY Right 2008  . TOTAL HIP ARTHROPLASTY Left 05/20/2017   Procedure: LEFT TOTAL HIP ARTHROPLASTY ANTERIOR APPROACH;  Surgeon: Gaynelle Arabian, MD;  Location: [facility];  Service: Orthopedics;  Laterality: Left;  . TOTAL KNEE ARTHROPLASTY Left 02/14/2014   DR Durward Fortes  . TOTAL KNEE ARTHROPLASTY Left 02/14/2014   Procedure: TOTAL KNEE ARTHROPLASTY;  Surgeon: Garald Balding, MD;  Location: [facility];  Service: Orthopedics;  Laterality: Left;  . TOTAL KNEE REVISION Left 07/14/2018   Procedure: TOTAL KNEE REVISION;  Surgeon: Gaynelle Arabian, MD;  Location: [facility];  Service: Orthopedics;  Laterality: Left;  Adductor Block     OB History    Gravida  2   Para  2   Term      Preterm      AB      Living  2     SAB      TAB      Ectopic      Multiple      Live Births              Family History  Problem Relation Age of Onset  .  Pancreatic cancer Mother 8  . Hypertension Father   . Heart disease Father   . Diabetes Son   . Colon cancer Neg Hx   . Esophageal cancer Neg Hx   . Stomach cancer Neg Hx   . Rectal cancer Neg Hx     Social History   Tobacco Use  . Smoking status: Former Smoker    Packs/day: 0.25    Years: 1.00    Pack years: 0.25    Types: Cigarettes    Quit date: 07/08/2010    Years since quitting: 9.0  . Smokeless tobacco: Never Used  Substance Use Topics  . Alcohol use: Yes    Alcohol/week: 0.0 standard drinks    Comment: Rare  . Drug use: No    Home Medications Prior to Admission medications   Medication Sig Start Date End Date Taking? Authorizing Provider  amLODipine (NORVASC) 5 MG tablet TAKE 1 TABLET(5 MG) BY MOUTH DAILY Patient taking differently: Take 5 mg by mouth daily.  07/08/19   Binnie Rail, MD  aspirin EC 81 MG tablet Take 162 mg by mouth at  bedtime.     [provider]  atorvastatin (LIPITOR) 40 MG tablet Take 1 tablet (40 mg total) by mouth daily. 04/27/19   Binnie Rail, MD  blood glucose meter kit and supplies KIT Dispense based on patient and insurance preference. Use up to four times daily as directed. (FOR E11.9). 05/11/17   Binnie Rail, MD  citalopram (CELEXA) 40 MG tablet Take 1 tablet (40 mg total) by mouth daily. Annual appt is due must see provider for future refills Patient taking differently: Take 40 mg by mouth daily.  07/18/19   Binnie Rail, MD  losartan (COZAAR) 50 MG tablet Take 1 tablet (50 mg total) by mouth daily. 03/01/19   Binnie Rail, MD  metFORMIN (GLUCOPHAGE) 500 MG tablet TAKE 1 TABLET BY MOUTH TWICE DAILY WITH A MEAL Patient taking differently: Take 500 mg by mouth daily with breakfast.  07/08/19   Burns, Claudina Lick, MD  tiZANidine (ZANAFLEX) 2 MG tablet Take 1 tablet (2 mg total) by mouth 2 (two) times daily as needed for muscle spasms. Patient not taking: Reported on 08/02/2019 06/01/19   Hoyt Koch, MD  vitamin B-12 (CYANOCOBALAMIN) 1000 MCG tablet Take 1,000 mcg by mouth daily.     [provider]    Allergies    Pollen extract and Bee pollen  Review of Systems   Review of Systems  All other systems reviewed and are negative.   Physical Exam Updated Vital Signs BP (!) 140/95 (BP Location: Right Arm)   Pulse 71   Temp 98.5 F (36.9 C) (Oral)   Resp 16   Ht 1.626 m (_0 )   Wt 90.7 kg   LMP 07/07/2011   SpO2 95%   BMI 34.33 kg/m   Physical Exam Vitals and nursing note reviewed.  Constitutional:      General: She is not in acute distress.    Appearance: Normal appearance. She is well-developed. She is not toxic-appearing.  HENT:     Head: Normocephalic and atraumatic.  Eyes:     General: Lids are normal.     Conjunctiva/sclera: Conjunctivae normal.     Pupils: Pupils are equal, round, and reactive to light.  Neck:     Thyroid: No thyroid mass.      Trachea: No tracheal deviation.  Cardiovascular:     Rate and Rhythm: Normal rate and  regular rhythm.     Heart sounds: Normal heart sounds. No murmur. No gallop.   Pulmonary:     Effort: Pulmonary effort is normal. No respiratory distress.     Breath sounds: Normal breath sounds. No stridor. No decreased breath sounds, wheezing, rhonchi or rales.  Abdominal:     General: Bowel sounds are normal. There is no distension.     Palpations: Abdomen is soft.     Tenderness: There is no abdominal tenderness. There is no rebound.  Musculoskeletal:        General: No tenderness. Normal range of motion.     Cervical back: Normal range of motion and neck supple.  Skin:    General: Skin is warm and dry.     Findings: No abrasion or rash.  Neurological:     Mental Status: She is alert and oriented to person, place, and time.     GCS: GCS eye subscore is 4. GCS verbal subscore is 5. GCS motor subscore is 6.     Cranial Nerves: No cranial nerve deficit.     Sensory: No sensory deficit.  Psychiatric:        Speech: Speech normal.        Behavior: Behavior normal.     ED Results / Procedures / Treatments   Labs (all labs ordered are listed, but only abnormal results are displayed) Labs Reviewed  CBG MONITORING, ED - Abnormal; Notable for the following components:      Result Value   Glucose-Capillary 106 (*)    All other components within normal limits  CBC WITH DIFFERENTIAL/PLATELET  COMPREHENSIVE METABOLIC PANEL  TROPONIN I (HIGH SENSITIVITY)    EKG EKG Interpretation  Date/Time:  Friday August 05 2019 10:15:19 EST Ventricular Rate:  72 PR Interval:    QRS Duration: 95 QT Interval:  416 QTC Calculation: 456 R Axis:   -62 Text Interpretation: Sinus rhythm Left anterior fascicular block Low voltage, precordial leads Abnormal R-wave progression, late transition No significant change since last tracing Confirmed by Lacretia Leigh ([id number]) on 08/05/2019 10:23:49 AM   Radiology No  results found.  Procedures Procedures (including critical care time)  Medications Ordered in ED Medications  0.9 %  sodium chloride infusion (has no administration in time range)    ED Course  I have reviewed the triage vital signs and the nursing notes.  Pertinent labs & imaging results that were available during my care of the patient were reviewed by me and considered in my medical decision making (see chart for details).    MDM Rules/Calculators/A&P                      Patient monitored here and no signs of ectopy on her central monitoring.  I did review her tracings throughout her entire over 3-hour stay.  Patient was symptomatic during that stay but yet nothing was recorded.  Labs here are reassuring.  Patient be discharged home and given return precautions as well as instruction to follow-up with her doctor for Holter monitor. Final Clinical Impression(s) / ED Diagnoses Final diagnoses:  SOB (shortness of breath)    Rx / DC Orders ED Discharge Orders    None       Lacretia Leigh, MD 08/05/19 1252

## 2019-08-05 NOTE — ED Triage Notes (Signed)
Pt has complaints of dizziness and a "weird sensation" on the left side of her chest that started this morning. Pt reports feeling like she was going to pass out. Pt has a hx of diabetes and hypertension.

## 2019-08-05 NOTE — Telephone Encounter (Signed)
  Patient calling, states she was seen in ED today, advised to reach out to PCP for order for holter monitor   Please advise

## 2019-08-08 ENCOUNTER — Encounter (HOSPITAL_COMMUNITY)
Admission: RE | Admit: 2019-08-08 | Discharge: 2019-08-08 | Disposition: A | Discharge status: Home / Self Care | Payer: BC Managed Care – PPO | Source: Ambulatory Visit | Attending: Orthopedic Surgery | Admitting: Orthopedic Surgery

## 2019-08-08 ENCOUNTER — Other Ambulatory Visit: Payer: Self-pay

## 2019-08-08 ENCOUNTER — Encounter (HOSPITAL_COMMUNITY): Payer: Self-pay

## 2019-08-08 DIAGNOSIS — R011 Cardiac murmur, unspecified: Secondary | ICD-10-CM | POA: Insufficient documentation

## 2019-08-08 DIAGNOSIS — E118 Type 2 diabetes mellitus with unspecified complications: Secondary | ICD-10-CM | POA: Diagnosis not present

## 2019-08-08 DIAGNOSIS — Z7984 Long term (current) use of oral hypoglycemic drugs: Secondary | ICD-10-CM | POA: Insufficient documentation

## 2019-08-08 DIAGNOSIS — I10 Essential (primary) hypertension: Secondary | ICD-10-CM | POA: Diagnosis not present

## 2019-08-08 DIAGNOSIS — Z7982 Long term (current) use of aspirin: Secondary | ICD-10-CM | POA: Insufficient documentation

## 2019-08-08 DIAGNOSIS — Z79899 Other long term (current) drug therapy: Secondary | ICD-10-CM | POA: Diagnosis not present

## 2019-08-08 DIAGNOSIS — Z01810 Encounter for preprocedural cardiovascular examination: Secondary | ICD-10-CM | POA: Diagnosis not present

## 2019-08-08 DIAGNOSIS — F329 Major depressive disorder, single episode, unspecified: Secondary | ICD-10-CM | POA: Insufficient documentation

## 2019-08-08 DIAGNOSIS — Z7901 Long term (current) use of anticoagulants: Secondary | ICD-10-CM | POA: Insufficient documentation

## 2019-08-08 DIAGNOSIS — E7800 Pure hypercholesterolemia, unspecified: Secondary | ICD-10-CM | POA: Diagnosis not present

## 2019-08-08 DIAGNOSIS — Z01818 Encounter for other preprocedural examination: Secondary | ICD-10-CM | POA: Diagnosis not present

## 2019-08-08 DIAGNOSIS — Z87891 Personal history of nicotine dependence: Secondary | ICD-10-CM | POA: Diagnosis not present

## 2019-08-08 DIAGNOSIS — Z01812 Encounter for preprocedural laboratory examination: Secondary | ICD-10-CM | POA: Diagnosis not present

## 2019-08-08 DIAGNOSIS — M24662 Ankylosis, left knee: Secondary | ICD-10-CM | POA: Diagnosis not present

## 2019-08-08 HISTORY — DX: Cardiac arrhythmia, unspecified: I49.9

## 2019-08-08 NOTE — Telephone Encounter (Signed)
LVM for pt to call back in regards.  

## 2019-08-08 NOTE — Telephone Encounter (Signed)
How frequently is she having the palpitations?  If she is not having them frequent enough then she may need to see cardiology because they may need to order a different Holter that she can wear longer than 48 hours.

## 2019-08-08 NOTE — Progress Notes (Signed)
PCP - Dr. Jenness Corner LOV: 06/01/19 Cardiologist -   Chest x-ray -  EKG - 08/05/19. EPIC Stress Test -  ECHO -  Cardiac Cath -   Sleep Study -  CPAP -   Fasting Blood Sugar -  Checks Blood Sugar _____ times a day  Blood Thinner Instructions: Aspirin Instructions: Last Dose: Do not get instructions yet,RN advised to call Dr's [place] office and ask them.  Anesthesia review: Pt. Verbalized that she visit the ED on 08/05/19 because she was having palpitations,she said she's feeling fine since then.RN advised her to notify her PCP about that event. Lenise Arena. anesthesia PA-C has been notify as well.  Patient denies shortness of breath, fever, cough and chest pain at PAT appointment   Patient verbalized understanding of instructions that were given to them at the PAT appointment. Patient was also instructed that they will need to review over the PAT instructions again at home before surgery.

## 2019-08-09 LAB — SURGICAL PCR SCREEN
MRSA, PCR: NEGATIVE
Staphylococcus aureus: NEGATIVE

## 2019-08-09 NOTE — Telephone Encounter (Signed)
Pt states she has been feeling ok and does not want to further evaluate at this time. Will call back if things persist.

## 2019-08-10 NOTE — Progress Notes (Signed)
Anesthesia Chart Review   Case: [id number] Date/Time: 08/15/19 1435   Procedure: Left knee arthrotomy; scar excision (Left Knee) - 50mn   Anesthesia type: Choice   Pre-op diagnosis: arthrofibrosis left knee   [facility] / [facility]   Surgeons: AGaynelle Arabian MD      DISCUSSION:64 y.o. former smoker (0.25 pack years, quit 07/08/10) with h/o DM II, HTN, arthrofibrosis of left knee scheduled for above procedure 08/15/19 with Dr. FGaynelle Arabian   Pt seen in ED 08/05/19 for palpitations.  Denies chest pain, pressure, shortness of breah, diaphoresis, syncope.  Tracings reviewed throughout 3 hour ED visit with no concerning findings.  Advised to follow up with PCP.  PCP contacted and pt reports sx have resolved, no recurrence.  No further workup initiated.  VS: BP (!) 166/82   Pulse 68   Temp 37.3 C (Oral)   Resp 18   Ht _0  (1.626 m)   Wt 94.3 kg   LMP 07/07/2011   SpO2 98%   BMI 35.70 kg/m   PROVIDERS: BBinnie Rail MD is PCP    LABS: Labs reviewed: Acceptable for surgery. (all labs ordered are listed, but only abnormal results are displayed)  Labs Reviewed - No data to display   IMAGES:   EKG: 08/08/19 Rate 72 bpm  Sinus rhythm  Left anterior fascicular block  Low voltage, precordial leads Abnormal R-wave progression, late transition  CV:  Past Medical History:  Diagnosis Date  . Allergy   . CIN I (cervical intraepithelial neoplasia I) 1996   cryo...  cone of cervix  . DEPRESSION   . [place]   . Dysrhythmia   . Endometrial polyp 07/21/2011  . Glaucoma   . Heart murmur    [age]  . HYPERCHOLESTEROLEMIA   . HYPERTENSION   . Osteoarthritis    hips and knees  . Palpitations 08/05/2019  . Pneumonia 06/2003  . Postsurgical menopause     Past Surgical History:  Procedure Laterality Date  . ABDOMINAL HYSTERECTOMY  08/22/2011  . CERVICAL CONE BIOPSY  1996  . CESAREAN SECTION     x1  . COLONOSCOPY W/ POLYPECTOMY  12/2008   x3   . DILATION AND CURETTAGE OF UTERUS    . TONSILLECTOMY    . TOTAL HIP ARTHROPLASTY Right 2008  . TOTAL HIP ARTHROPLASTY Left 05/20/2017   Procedure: LEFT TOTAL HIP ARTHROPLASTY ANTERIOR APPROACH;  Surgeon: AGaynelle Arabian MD;  [facility];  Service: Orthopedics;  Laterality: Left;  . TOTAL KNEE ARTHROPLASTY Left 02/14/2014   DR WDurward Fortes . TOTAL KNEE ARTHROPLASTY Left 02/14/2014   Procedure: TOTAL KNEE ARTHROPLASTY;  Surgeon: PGarald Balding MD;  [facility] [facility]  Service: Orthopedics;  Laterality: Left;  . TOTAL KNEE REVISION Left 07/14/2018   Procedure: TOTAL KNEE REVISION;  Surgeon: AGaynelle Arabian MD;  [facility];  Service: Orthopedics;  Laterality: Left;  Adductor Block    MEDICATIONS: . amLODipine (NORVASC) 5 MG tablet  . aspirin EC 81 MG tablet  . atorvastatin (LIPITOR) 40 MG tablet  . blood glucose meter kit and supplies KIT  . citalopram (CELEXA) 40 MG tablet  . losartan (COZAAR) 50 MG tablet  . metFORMIN (GLUCOPHAGE) 500 MG tablet  . tiZANidine (ZANAFLEX) 2 MG tablet  . vitamin B-12 (CYANOCOBALAMIN) 1000 MCG tablet   No current facility-administered medications for this encounter.     [facility] [phone]/24/21  3:24 PM

## 2019-08-11 ENCOUNTER — Other Ambulatory Visit (HOSPITAL_COMMUNITY)
Admission: RE | Admit: 2019-08-11 | Discharge: 2019-08-11 | Disposition: A | Discharge status: Home / Self Care | Payer: BC Managed Care – PPO | Source: Ambulatory Visit | Attending: Orthopedic Surgery | Admitting: Orthopedic Surgery

## 2019-08-11 DIAGNOSIS — Z01812 Encounter for preprocedural laboratory examination: Secondary | ICD-10-CM | POA: Insufficient documentation

## 2019-08-11 DIAGNOSIS — Z20822 Contact with and (suspected) exposure to covid-19: Secondary | ICD-10-CM | POA: Insufficient documentation

## 2019-08-11 LAB — SARS CORONAVIRUS 2 (TAT 6-24 HRS): SARS Coronavirus 2: NEGATIVE

## 2019-08-14 NOTE — H&P (Signed)
CC- Katherine Lawrence is a 64 y.o. female who presents with left knee stiffness.  HPI- . Knee Pain: Patient presents with stiffness involving the  left knee. Onset of the symptoms was approximately a year ago. Inciting event: She had a revision left TKA due to pain and stiffness and has done well with improved range of motion but she has lost some motion and is having difficulty ambulating and difficulty with functional activities. She had a closed manipulation with minimal improvement. She can not flex beyond approximately 65 degrees (was about 40 pre-op) SHe presents today for arthrotomy and scar excision .   Past Medical History:  Diagnosis Date  . Allergy   . CIN I (cervical intraepithelial neoplasia I) 1996   cryo...  cone of cervix  . DEPRESSION   . Diabetes type 2, controlled ([place])   . Dysrhythmia   . Endometrial polyp 07/21/2011  . Glaucoma   . Heart murmur    age 25   . HYPERCHOLESTEROLEMIA   . HYPERTENSION   . Osteoarthritis    hips and knees  . Palpitations 08/05/2019  . Pneumonia 06/2003  . Postsurgical menopause     Past Surgical History:  Procedure Laterality Date  . ABDOMINAL HYSTERECTOMY  08/22/2011  . CERVICAL CONE BIOPSY  1996  . CESAREAN SECTION     x1  . COLONOSCOPY W/ POLYPECTOMY  12/2008   x3  . DILATION AND CURETTAGE OF UTERUS    . TONSILLECTOMY    . TOTAL HIP ARTHROPLASTY Right 2008  . TOTAL HIP ARTHROPLASTY Left 05/20/2017   Procedure: LEFT TOTAL HIP ARTHROPLASTY ANTERIOR APPROACH;  Surgeon: Gaynelle Arabian, MD;  [facility];  Service: Orthopedics;  Laterality: Left;  . TOTAL KNEE ARTHROPLASTY Left 02/14/2014   DR Durward Fortes  . TOTAL KNEE ARTHROPLASTY Left 02/14/2014   Procedure: TOTAL KNEE ARTHROPLASTY;  Surgeon: Garald Balding, MD;  Location: [place];  Service: Orthopedics;  Laterality: Left;  . TOTAL KNEE REVISION Left 07/14/2018   Procedure: TOTAL KNEE REVISION;  Surgeon: Gaynelle Arabian, MD;  [facility];  Service: Orthopedics;  Laterality: Left;   Adductor Block    Prior to Admission medications   Medication Sig Start Date End Date Taking? Authorizing Provider  amLODipine (NORVASC) 5 MG tablet TAKE 1 TABLET(5 MG) BY MOUTH DAILY Patient taking differently: Take 5 mg by mouth daily.  07/08/19  Yes Burns, Claudina Lick, MD  aspirin EC 81 MG tablet Take 81 mg by mouth at bedtime.    Yes [provider]  atorvastatin (LIPITOR) 40 MG tablet Take 1 tablet (40 mg total) by mouth daily. 04/27/19  Yes Burns, Claudina Lick, MD  citalopram (CELEXA) 40 MG tablet Take 1 tablet (40 mg total) by mouth daily. Annual appt is due must see provider for future refills Patient taking differently: Take 40 mg by mouth daily.  07/18/19  Yes Burns, Claudina Lick, MD  losartan (COZAAR) 50 MG tablet Take 1 tablet (50 mg total) by mouth daily. 03/01/19  Yes Burns, Claudina Lick, MD  metFORMIN (GLUCOPHAGE) 500 MG tablet TAKE 1 TABLET BY MOUTH TWICE DAILY WITH A MEAL Patient taking differently: Take 500 mg by mouth daily with breakfast.  07/08/19  Yes Burns, Claudina Lick, MD  vitamin B-12 (CYANOCOBALAMIN) 1000 MCG tablet Take 1,000 mcg by mouth daily.    Yes [provider]  blood glucose meter kit and supplies KIT Dispense based on patient and insurance preference. Use up to four times daily as directed. (FOR E11.9). 05/11/17  Binnie Rail, MD  tiZANidine (ZANAFLEX) 2 MG tablet Take 1 tablet (2 mg total) by mouth 2 (two) times daily as needed for muscle spasms. Patient not taking: Reported on 08/02/2019 06/01/19   Hoyt Koch, MD   Left Knee antalgic gait, No warmth or effusion, reduced range of motion (15-60 active-65 passive), collateral ligaments intact  Physical Examination: General appearance - alert, well appearing, and in no distress Mental status - alert, oriented to person, place, and time Chest - clear to auscultation, no wheezes, rales or rhonchi, symmetric air entry Heart - normal rate, regular rhythm, normal S1, S2, no murmurs, rubs, clicks or  gallops Abdomen - soft, nontender, nondistended, no masses or organomegaly Neurological - alert, oriented, normal speech, no focal findings or movement disorder noted   Asessment/Plan--- Left knee arthrofibrosis- - Plan left knee arthroscopy with meniscal debridement. Procedure risks and potential comps discussed with patient who elects to proceed. Goals are decreased pain and increased function with a high likelihood of achieving both

## 2019-08-15 ENCOUNTER — Observation Stay (HOSPITAL_COMMUNITY)
Admission: RE | Admit: 2019-08-15 | Discharge: 2019-08-16 | Disposition: A | Discharge status: Home / Self Care | Payer: BC Managed Care – PPO | Attending: Orthopedic Surgery | Admitting: Orthopedic Surgery

## 2019-08-15 ENCOUNTER — Ambulatory Visit (HOSPITAL_COMMUNITY): Payer: BC Managed Care – PPO | Admitting: Anesthesiology

## 2019-08-15 ENCOUNTER — Encounter (HOSPITAL_COMMUNITY)
Admission: RE | Disposition: A | Discharge status: Home / Self Care | Payer: Self-pay | Source: Home / Self Care | Attending: Orthopedic Surgery

## 2019-08-15 ENCOUNTER — Other Ambulatory Visit: Payer: Self-pay

## 2019-08-15 ENCOUNTER — Encounter (HOSPITAL_COMMUNITY): Payer: Self-pay | Admitting: Orthopedic Surgery

## 2019-08-15 ENCOUNTER — Ambulatory Visit (HOSPITAL_COMMUNITY): Payer: BC Managed Care – PPO | Admitting: Physician Assistant

## 2019-08-15 DIAGNOSIS — Z7984 Long term (current) use of oral hypoglycemic drugs: Secondary | ICD-10-CM | POA: Diagnosis not present

## 2019-08-15 DIAGNOSIS — Z7982 Long term (current) use of aspirin: Secondary | ICD-10-CM | POA: Diagnosis not present

## 2019-08-15 DIAGNOSIS — E7800 Pure hypercholesterolemia, unspecified: Secondary | ICD-10-CM | POA: Diagnosis not present

## 2019-08-15 DIAGNOSIS — Z79899 Other long term (current) drug therapy: Secondary | ICD-10-CM | POA: Insufficient documentation

## 2019-08-15 DIAGNOSIS — Z20822 Contact with and (suspected) exposure to covid-19: Secondary | ICD-10-CM | POA: Diagnosis not present

## 2019-08-15 DIAGNOSIS — M24662 Ankylosis, left knee: Secondary | ICD-10-CM | POA: Diagnosis not present

## 2019-08-15 DIAGNOSIS — F329 Major depressive disorder, single episode, unspecified: Secondary | ICD-10-CM | POA: Insufficient documentation

## 2019-08-15 DIAGNOSIS — M1990 Unspecified osteoarthritis, unspecified site: Secondary | ICD-10-CM | POA: Insufficient documentation

## 2019-08-15 DIAGNOSIS — I10 Essential (primary) hypertension: Secondary | ICD-10-CM | POA: Diagnosis not present

## 2019-08-15 DIAGNOSIS — G8918 Other acute postprocedural pain: Secondary | ICD-10-CM | POA: Diagnosis not present

## 2019-08-15 DIAGNOSIS — E119 Type 2 diabetes mellitus without complications: Secondary | ICD-10-CM | POA: Diagnosis not present

## 2019-08-15 DIAGNOSIS — Z9889 Other specified postprocedural states: Secondary | ICD-10-CM | POA: Diagnosis not present

## 2019-08-15 HISTORY — PX: KNEE ARTHROTOMY: SHX5881

## 2019-08-15 LAB — GLUCOSE, CAPILLARY
Glucose-Capillary: 94 mg/dL (ref 70–99)
Glucose-Capillary: 107 mg/dL — ABNORMAL HIGH (ref 70–99)

## 2019-08-15 SURGERY — ARTHROTOMY, KNEE
Anesthesia: General | Site: Knee | Laterality: Left

## 2019-08-15 MED ORDER — OXYCODONE HCL 5 MG PO TABS: 5.0000 mg | ORAL_TABLET | Freq: Four times a day (QID) | ORAL | 0 refills | Status: DC | PRN

## 2019-08-15 MED ORDER — DEXAMETHASONE SODIUM PHOSPHATE 10 MG/ML IJ SOLN
8.0000 mg | Freq: Once | INTRAMUSCULAR | Status: AC
  Administered 2019-08-15: 15:00:00 8 mg via INTRAVENOUS

## 2019-08-15 MED ORDER — MENTHOL 3 MG MT LOZG: 1.0000 | LOZENGE | OROMUCOSAL | Status: DC | PRN

## 2019-08-15 MED ORDER — EPHEDRINE 5 MG/ML INJ
INTRAVENOUS | Status: AC
  Filled 2019-08-15: qty 10

## 2019-08-15 MED ORDER — ACETAMINOPHEN 500 MG PO TABS
1000.0000 mg | ORAL_TABLET | Freq: Four times a day (QID) | ORAL | Status: DC
  Administered 2019-08-15 – 2019-08-16 (×3): 1000 mg via ORAL
  Filled 2019-08-15 (×3): qty 2

## 2019-08-15 MED ORDER — LIDOCAINE 2% (20 MG/ML) 5 ML SYRINGE
INTRAMUSCULAR | Status: AC
  Filled 2019-08-15: qty 5

## 2019-08-15 MED ORDER — PROPOFOL 10 MG/ML IV BOLUS
INTRAVENOUS | Status: AC
  Filled 2019-08-15: qty 20

## 2019-08-15 MED ORDER — DOCUSATE SODIUM 100 MG PO CAPS
100.0000 mg | ORAL_CAPSULE | Freq: Two times a day (BID) | ORAL | Status: DC
  Administered 2019-08-15 – 2019-08-16 (×2): 100 mg via ORAL
  Filled 2019-08-15 (×2): qty 1

## 2019-08-15 MED ORDER — FLEET ENEMA 7-19 GM/118ML RE ENEM: 1.0000 | ENEMA | Freq: Once | RECTAL | Status: DC | PRN

## 2019-08-15 MED ORDER — LACTATED RINGERS IV BOLUS: 250.0000 mL | Freq: Once | INTRAVENOUS | Status: DC

## 2019-08-15 MED ORDER — CEFAZOLIN SODIUM-DEXTROSE 2-4 GM/100ML-% IV SOLN
2.0000 g | Freq: Four times a day (QID) | INTRAVENOUS | Status: AC
  Administered 2019-08-15 – 2019-08-16 (×2): 2 g via INTRAVENOUS
  Filled 2019-08-15 (×2): qty 100

## 2019-08-15 MED ORDER — DEXAMETHASONE SODIUM PHOSPHATE 10 MG/ML IJ SOLN
INTRAMUSCULAR | Status: AC
  Filled 2019-08-15: qty 1

## 2019-08-15 MED ORDER — ASPIRIN EC 325 MG PO TBEC: 325.0000 mg | DELAYED_RELEASE_TABLET | Freq: Every day | ORAL | 0 refills | Status: AC

## 2019-08-15 MED ORDER — ONDANSETRON HCL 4 MG/2ML IJ SOLN
INTRAMUSCULAR | Status: AC
  Filled 2019-08-15: qty 2

## 2019-08-15 MED ORDER — METOCLOPRAMIDE HCL 5 MG PO TABS: 5.0000 mg | ORAL_TABLET | Freq: Three times a day (TID) | ORAL | Status: DC | PRN

## 2019-08-15 MED ORDER — ASPIRIN EC 325 MG PO TBEC
325.0000 mg | DELAYED_RELEASE_TABLET | Freq: Two times a day (BID) | ORAL | Status: DC
  Administered 2019-08-16: 325 mg via ORAL
  Filled 2019-08-15: qty 1

## 2019-08-15 MED ORDER — METHOCARBAMOL 500 MG PO TABS
500.0000 mg | ORAL_TABLET | Freq: Four times a day (QID) | ORAL | Status: DC | PRN
  Administered 2019-08-15 – 2019-08-16 (×3): 500 mg via ORAL
  Filled 2019-08-15 (×3): qty 1

## 2019-08-15 MED ORDER — PHENOL 1.4 % MT LIQD
1.0000 | OROMUCOSAL | Status: DC | PRN
  Filled 2019-08-15: qty 177

## 2019-08-15 MED ORDER — EPHEDRINE SULFATE-NACL 50-0.9 MG/10ML-% IV SOSY
PREFILLED_SYRINGE | INTRAVENOUS | Status: DC | PRN
  Administered 2019-08-15 (×2): 10 mg via INTRAVENOUS

## 2019-08-15 MED ORDER — DIPHENHYDRAMINE HCL 12.5 MG/5ML PO ELIX: 12.5000 mg | ORAL_SOLUTION | ORAL | Status: DC | PRN

## 2019-08-15 MED ORDER — ATORVASTATIN CALCIUM 40 MG PO TABS
40.0000 mg | ORAL_TABLET | Freq: Every day | ORAL | Status: DC
  Administered 2019-08-16: 40 mg via ORAL
  Filled 2019-08-15: qty 1

## 2019-08-15 MED ORDER — LACTATED RINGERS IV BOLUS
500.0000 mL | Freq: Once | INTRAVENOUS | Status: AC
  Administered 2019-08-15: 250 mL via INTRAVENOUS

## 2019-08-15 MED ORDER — MIDAZOLAM HCL 5 MG/5ML IJ SOLN
INTRAMUSCULAR | Status: DC | PRN
  Administered 2019-08-15: 2 mg via INTRAVENOUS

## 2019-08-15 MED ORDER — CHLORHEXIDINE GLUCONATE 4 % EX LIQD: 60.0000 mL | Freq: Once | CUTANEOUS | Status: DC

## 2019-08-15 MED ORDER — FENTANYL CITRATE (PF) 100 MCG/2ML IJ SOLN
50.0000 ug | Freq: Once | INTRAMUSCULAR | Status: AC
  Administered 2019-08-15: 75 ug via INTRAVENOUS
  Filled 2019-08-15: qty 2

## 2019-08-15 MED ORDER — LIDOCAINE 2% (20 MG/ML) 5 ML SYRINGE
INTRAMUSCULAR | Status: DC | PRN
  Administered 2019-08-15: 60 mg via INTRAVENOUS

## 2019-08-15 MED ORDER — ACETAMINOPHEN 10 MG/ML IV SOLN
1000.0000 mg | Freq: Four times a day (QID) | INTRAVENOUS | Status: DC
  Administered 2019-08-15: 1000 mg via INTRAVENOUS
  Filled 2019-08-15: qty 100

## 2019-08-15 MED ORDER — METHOCARBAMOL 500 MG PO TABS: 500.0000 mg | ORAL_TABLET | Freq: Four times a day (QID) | ORAL | 0 refills | Status: DC | PRN

## 2019-08-15 MED ORDER — BISACODYL 10 MG RE SUPP: 10.0000 mg | Freq: Every day | RECTAL | Status: DC | PRN

## 2019-08-15 MED ORDER — DEXAMETHASONE SODIUM PHOSPHATE 10 MG/ML IJ SOLN
10.0000 mg | Freq: Once | INTRAMUSCULAR | Status: AC
  Administered 2019-08-16: 10 mg via INTRAVENOUS
  Filled 2019-08-15: qty 1

## 2019-08-15 MED ORDER — ONDANSETRON HCL 4 MG/2ML IJ SOLN
INTRAMUSCULAR | Status: DC | PRN
  Administered 2019-08-15: 4 mg via INTRAVENOUS

## 2019-08-15 MED ORDER — METHOCARBAMOL 500 MG IVPB - SIMPLE MED
500.0000 mg | Freq: Four times a day (QID) | INTRAVENOUS | Status: DC | PRN
  Filled 2019-08-15: qty 50

## 2019-08-15 MED ORDER — METHOCARBAMOL 500 MG IVPB - SIMPLE MED
INTRAVENOUS | Status: AC
  Administered 2019-08-15: 500 mg via INTRAVENOUS
  Filled 2019-08-15: qty 50

## 2019-08-15 MED ORDER — FENTANYL CITRATE (PF) 100 MCG/2ML IJ SOLN
INTRAMUSCULAR | Status: DC | PRN
  Administered 2019-08-15 (×2): 25 ug via INTRAVENOUS
  Administered 2019-08-15: 50 ug via INTRAVENOUS

## 2019-08-15 MED ORDER — METOCLOPRAMIDE HCL 5 MG/ML IJ SOLN: 5.0000 mg | Freq: Three times a day (TID) | INTRAMUSCULAR | Status: DC | PRN

## 2019-08-15 MED ORDER — LACTATED RINGERS IV SOLN: INTRAVENOUS | Status: DC

## 2019-08-15 MED ORDER — AMLODIPINE BESYLATE 5 MG PO TABS
5.0000 mg | ORAL_TABLET | Freq: Every day | ORAL | Status: DC
  Administered 2019-08-16: 5 mg via ORAL
  Filled 2019-08-15: qty 1

## 2019-08-15 MED ORDER — LOSARTAN POTASSIUM 50 MG PO TABS
50.0000 mg | ORAL_TABLET | Freq: Every day | ORAL | Status: DC
  Administered 2019-08-16: 50 mg via ORAL
  Filled 2019-08-15: qty 1

## 2019-08-15 MED ORDER — OXYCODONE HCL 5 MG PO TABS: 5.0000 mg | ORAL_TABLET | ORAL | Status: DC | PRN

## 2019-08-15 MED ORDER — OXYCODONE HCL 5 MG PO TABS
10.0000 mg | ORAL_TABLET | ORAL | Status: DC | PRN
  Administered 2019-08-15 – 2019-08-16 (×3): 10 mg via ORAL
  Filled 2019-08-15 (×4): qty 2

## 2019-08-15 MED ORDER — OXYCODONE HCL 5 MG PO TABS
ORAL_TABLET | ORAL | Status: AC
  Administered 2019-08-15: 5 mg via ORAL
  Filled 2019-08-15: qty 1

## 2019-08-15 MED ORDER — POLYETHYLENE GLYCOL 3350 17 G PO PACK: 17.0000 g | PACK | Freq: Every day | ORAL | Status: DC | PRN

## 2019-08-15 MED ORDER — HYDROMORPHONE HCL 1 MG/ML IJ SOLN
INTRAMUSCULAR | Status: AC
  Administered 2019-08-15: 0.5 mg via INTRAVENOUS
  Filled 2019-08-15: qty 1

## 2019-08-15 MED ORDER — HYDROMORPHONE HCL 1 MG/ML IJ SOLN
0.2500 mg | INTRAMUSCULAR | Status: AC | PRN
  Administered 2019-08-15: 0.5 mg via INTRAVENOUS

## 2019-08-15 MED ORDER — OXYCODONE HCL 5 MG PO TABS: 5.0000 mg | ORAL_TABLET | Freq: Once | ORAL | Status: AC | PRN

## 2019-08-15 MED ORDER — ONDANSETRON HCL 4 MG/2ML IJ SOLN: 4.0000 mg | Freq: Four times a day (QID) | INTRAMUSCULAR | Status: DC | PRN

## 2019-08-15 MED ORDER — FENTANYL CITRATE (PF) 100 MCG/2ML IJ SOLN
INTRAMUSCULAR | Status: AC
  Administered 2019-08-15: 25 ug via INTRAVENOUS
  Filled 2019-08-15: qty 2

## 2019-08-15 MED ORDER — BUPIVACAINE HCL (PF) 0.25 % IJ SOLN
INTRAMUSCULAR | Status: AC
  Filled 2019-08-15: qty 30

## 2019-08-15 MED ORDER — POVIDONE-IODINE 10 % EX SWAB
2.0000 "application " | Freq: Once | CUTANEOUS | Status: AC
  Administered 2019-08-15: 2 via TOPICAL

## 2019-08-15 MED ORDER — MIDAZOLAM HCL 2 MG/2ML IJ SOLN
1.0000 mg | Freq: Once | INTRAMUSCULAR | Status: DC
  Filled 2019-08-15: qty 2

## 2019-08-15 MED ORDER — MIDAZOLAM HCL 2 MG/2ML IJ SOLN
INTRAMUSCULAR | Status: AC
  Filled 2019-08-15: qty 2

## 2019-08-15 MED ORDER — FENTANYL CITRATE (PF) 100 MCG/2ML IJ SOLN
25.0000 ug | INTRAMUSCULAR | Status: DC | PRN
  Administered 2019-08-15 (×2): 25 ug via INTRAVENOUS
  Administered 2019-08-15: 50 ug via INTRAVENOUS

## 2019-08-15 MED ORDER — SODIUM CHLORIDE 0.9 % IV SOLN: INTRAVENOUS | Status: DC

## 2019-08-15 MED ORDER — CITALOPRAM HYDROBROMIDE 20 MG PO TABS
40.0000 mg | ORAL_TABLET | Freq: Every day | ORAL | Status: DC
  Administered 2019-08-16: 40 mg via ORAL
  Filled 2019-08-15 (×2): qty 2

## 2019-08-15 MED ORDER — MORPHINE SULFATE (PF) 2 MG/ML IV SOLN: 0.5000 mg | INTRAVENOUS | Status: DC | PRN

## 2019-08-15 MED ORDER — FENTANYL CITRATE (PF) 100 MCG/2ML IJ SOLN
INTRAMUSCULAR | Status: AC
  Filled 2019-08-15: qty 2

## 2019-08-15 MED ORDER — CLONIDINE HCL (ANALGESIA) 100 MCG/ML EP SOLN
EPIDURAL | Status: DC | PRN
  Administered 2019-08-15: 100 ug

## 2019-08-15 MED ORDER — PROPOFOL 10 MG/ML IV BOLUS
INTRAVENOUS | Status: DC | PRN
  Administered 2019-08-15: 160 mg via INTRAVENOUS

## 2019-08-15 MED ORDER — PROMETHAZINE HCL 25 MG/ML IJ SOLN: 6.2500 mg | INTRAMUSCULAR | Status: DC | PRN

## 2019-08-15 MED ORDER — CEFAZOLIN SODIUM-DEXTROSE 2-4 GM/100ML-% IV SOLN
2.0000 g | INTRAVENOUS | Status: AC
  Administered 2019-08-15: 2 g via INTRAVENOUS
  Filled 2019-08-15: qty 100

## 2019-08-15 MED ORDER — ONDANSETRON HCL 4 MG PO TABS: 4.0000 mg | ORAL_TABLET | Freq: Four times a day (QID) | ORAL | Status: DC | PRN

## 2019-08-15 MED ORDER — ROPIVACAINE HCL 5 MG/ML IJ SOLN
INTRAMUSCULAR | Status: DC | PRN
  Administered 2019-08-15: 20 mL via PERINEURAL

## 2019-08-15 MED ORDER — OXYCODONE HCL 5 MG/5ML PO SOLN: 5.0000 mg | Freq: Once | ORAL | Status: AC | PRN

## 2019-08-15 SURGICAL SUPPLY — 32 items
BAG ZIPLOCK 12X15 (MISCELLANEOUS) ×2 IMPLANT
BNDG ELASTIC 6X5.8 VLCR STR LF (GAUZE/BANDAGES/DRESSINGS) ×2 IMPLANT
COVER SURGICAL LIGHT HANDLE (MISCELLANEOUS) ×2 IMPLANT
COVER WAND RF STERILE (DRAPES) IMPLANT
CUFF TOURN SGL QUICK 34 (TOURNIQUET CUFF) ×1
CUFF TRNQT CYL 34X4.125X (TOURNIQUET CUFF) ×1 IMPLANT
DRAPE U-SHAPE 47X51 STRL (DRAPES) ×2 IMPLANT
DRSG ADAPTIC 3X8 NADH LF (GAUZE/BANDAGES/DRESSINGS) ×2 IMPLANT
DRSG PAD ABDOMINAL 8X10 ST (GAUZE/BANDAGES/DRESSINGS) ×4 IMPLANT
DURAPREP 26ML APPLICATOR (WOUND CARE) ×2 IMPLANT
ELECT REM PT RETURN 15FT ADLT (MISCELLANEOUS) ×2 IMPLANT
EVACUATOR 1/8 PVC DRAIN (DRAIN) IMPLANT
GAUZE SPONGE 4X4 12PLY STRL (GAUZE/BANDAGES/DRESSINGS) ×2 IMPLANT
GLOVE BIO SURGEON STRL SZ7 (GLOVE) ×2 IMPLANT
GLOVE BIO SURGEON STRL SZ8 (GLOVE) ×2 IMPLANT
GLOVE BIOGEL PI IND STRL 7.0 (GLOVE) ×1 IMPLANT
GLOVE BIOGEL PI IND STRL 8 (GLOVE) ×3 IMPLANT
GLOVE BIOGEL PI INDICATOR 7.0 (GLOVE) ×1
GLOVE BIOGEL PI INDICATOR 8 (GLOVE) ×3
GOWN STRL REUS W/TWL LRG LVL3 (GOWN DISPOSABLE) ×6 IMPLANT
KIT TURNOVER KIT A (KITS) ×2 IMPLANT
MANIFOLD NEPTUNE II (INSTRUMENTS) ×2 IMPLANT
PACK TOTAL KNEE CUSTOM (KITS) ×2 IMPLANT
PADDING CAST COTTON 6X4 STRL (CAST SUPPLIES) ×6 IMPLANT
PENCIL SMOKE EVACUATOR (MISCELLANEOUS) ×2 IMPLANT
PROTECTOR NERVE ULNAR (MISCELLANEOUS) IMPLANT
STRIP CLOSURE SKIN 1/2X4 (GAUZE/BANDAGES/DRESSINGS) ×4 IMPLANT
SUT MNCRL AB 4-0 PS2 18 (SUTURE) ×2 IMPLANT
SUT STRATAFIX 0 PDS 27 VIOLET (SUTURE) ×2
SUT VIC AB 2-0 CT1 27 (SUTURE) ×2
SUT VIC AB 2-0 CT1 TAPERPNT 27 (SUTURE) ×2 IMPLANT
SUTURE STRATFX 0 PDS 27 VIOLET (SUTURE) ×1 IMPLANT

## 2019-08-15 NOTE — Anesthesia Procedure Notes (Signed)
Anesthesia Regional Block: Adductor canal block   Pre-Anesthetic Checklist: ,, timeout performed, Correct Patient, Correct Site, Correct Laterality, Correct Procedure, Correct Position, site marked, Risks and benefits discussed,  Surgical consent,  Pre-op evaluation,  At surgeon's request and post-op pain management  Laterality: Left  Prep: chloraprep       Needles:  Injection technique: Single-shot  Needle Type: Echogenic Needle     Needle Length: 9cm      Additional Needles:   Procedures:,,,, ultrasound used (permanent image in chart),,,,  Narrative:  Start time: 08/15/2019 2:01 PM End time: 08/15/2019 2:11 PM Injection made incrementally with aspirations every 5 mL.  Performed by: Personally  Anesthesiologist: Myrtie Soman, MD  Additional Notes: Patient tolerated the procedure well without complications

## 2019-08-15 NOTE — Anesthesia Preprocedure Evaluation (Signed)
Anesthesia Evaluation  Patient identified by MRN, date of birth, ID band Patient awake    Reviewed: Allergy & Precautions, NPO status , Patient's Chart, lab work & pertinent test results  Airway Mallampati: II  TM Distance: >3 FB Neck ROM: Full    Dental no notable dental hx.    Pulmonary neg pulmonary ROS, former smoker,    Pulmonary exam normal breath sounds clear to auscultation       Cardiovascular hypertension, Normal cardiovascular exam Rhythm:Regular Rate:Normal     Neuro/Psych negative neurological ROS  negative psych ROS   GI/Hepatic negative GI ROS, Neg liver ROS,   Endo/Other  diabetes  Renal/GU negative Renal ROS  negative genitourinary   Musculoskeletal negative musculoskeletal ROS (+)   Abdominal   Peds negative pediatric ROS (+)  Hematology negative hematology ROS (+)   Anesthesia Other Findings   Reproductive/Obstetrics negative OB ROS                             Anesthesia Physical Anesthesia Plan  ASA: III  Anesthesia Plan: General   Post-op Pain Management:  Regional for Post-op pain   Induction: Intravenous  PONV Risk Score and Plan: 3 and Ondansetron, Dexamethasone and Treatment may vary due to age or medical condition  Airway Management Planned: LMA  Additional Equipment:   Intra-op Plan:   Post-operative Plan: Extubation in OR  Informed Consent: I have reviewed the patients History and Physical, chart, labs and discussed the procedure including the risks, benefits and alternatives for the proposed anesthesia with the patient or authorized representative who has indicated his/her understanding and acceptance.     Dental advisory given  Plan Discussed with: CRNA and Surgeon  Anesthesia Plan Comments:         Anesthesia Quick Evaluation

## 2019-08-15 NOTE — Brief Op Note (Signed)
08/15/2019  3:38 PM  PATIENT:  Katherine Lawrence  64 y.o. female  PRE-OPERATIVE DIAGNOSIS:  arthrofibrosis left knee  POST-OPERATIVE DIAGNOSIS:  arthrofibrosis left knee  PROCEDURE:  Procedure(s) with comments: Left knee arthrotomy; scar excision (Left) - 1min  SURGEON:  Surgeon(s) and Role:    Gaynelle Arabian, MD - Primary  PHYSICIAN ASSISTANT:   ASSISTANTS: Theresa Duty, PA-C   ANESTHESIA:   general and adductor canal block  EBL:  minimal  BLOOD ADMINISTERED:none  DRAINS: none   LOCAL MEDICATIONS USED:  NONE  SPECIMEN:  No Specimen  COUNTS:  YES  TOURNIQUET:   Total Tourniquet Time Documented: Thigh (Left) - 21 minutes Total: Thigh (Left) - 21 minutes   DICTATION: .Other Dictation: Dictation Number [id number]  PLAN OF CARE: Discharge to home after PACU  PATIENT DISPOSITION:  PACU - hemodynamically stable.

## 2019-08-15 NOTE — Anesthesia Procedure Notes (Signed)
Procedure Name: LMA Insertion Date/Time: 08/15/2019 2:55 PM Performed by: Merdith Adan D, CRNA Pre-anesthesia Checklist: Patient identified, Emergency Drugs available, Suction available and Patient being monitored Patient Re-evaluated:Patient Re-evaluated prior to induction Oxygen Delivery Method: Circle system utilized Preoxygenation: Pre-oxygenation with 100% oxygen Induction Type: IV induction Ventilation: Mask ventilation without difficulty LMA Size: 4.0 Tube type: Oral Number of attempts: 1 Placement Confirmation: positive ETCO2 and breath sounds checked- equal and bilateral Tube secured with: Tape Dental Injury: Teeth and Oropharynx as per pre-operative assessment

## 2019-08-15 NOTE — Transfer of Care (Signed)
Immediate Anesthesia Transfer of Care Note  Patient: Katherine Lawrence  Procedure(s) Performed: Left knee arthrotomy; scar excision (Left Knee)  Patient Location: PACU  Anesthesia Type:General and Regional  Level of Consciousness: awake, alert  and oriented  Airway & Oxygen Therapy: Patient Spontanous Breathing and Patient connected to nasal cannula oxygen  Post-op Assessment: Report given to RN and Post -op Vital signs reviewed and stable  Post vital signs: Reviewed and stable  Last Vitals:  Vitals Value Taken Time  BP    Temp    Pulse 58 08/15/19 1610  Resp 12 08/15/19 1610  SpO2 98 % 08/15/19 1610  Vitals shown include unvalidated device data.  Last Pain:  Vitals:   08/15/19 1259  TempSrc:   PainSc: 0-No pain         Complications: No apparent anesthesia complications

## 2019-08-15 NOTE — Progress Notes (Signed)
Assisted Dr. Rose with left, ultrasound guided, adductor canal block. Side rails up, monitors on throughout procedure. See vital signs in flow sheet. Tolerated Procedure well.  

## 2019-08-15 NOTE — Anesthesia Procedure Notes (Signed)
Anesthesia Procedure Image    

## 2019-08-15 NOTE — Discharge Instructions (Addendum)
Katherine Arabian, MD [facility] [address], [address] [place], [place] [address] [phone]  POSTOPERATIVE DIRECTIONS    Knee Rehabilitation, Guidelines Following Surgery  Results after knee surgery are often greatly improved when you follow the exercise, range of motion and muscle strengthening exercises prescribed by your doctor. Safety measures are also important to protect the knee from further injury. If any of these exercises cause you to have increased pain or swelling in your knee joint, decrease the amount until you are comfortable again and slowly increase them. If you have problems or questions, call your caregiver or physical therapist for advice.   BLOOD CLOT PREVENTION . Take a 325 mg Aspirin two times a day for three weeks following surgery. Then resume one 81 mg Aspirin once a day. Katherine Lawrence may resume your vitamins/supplements upon discharge from the hospital. . Do not take any NSAIDs (Advil, Aleve, Ibuprofen, Meloxicam, etc.) until you have discontinued the 325 mg Aspirin.  HOME CARE INSTRUCTIONS  . Remove items at home which could result in a fall. This includes throw rugs or furniture in walking pathways.  . ICE to the affected knee as much as tolerated. Icing helps control swelling. If the swelling is well controlled you will be more comfortable and rehab easier. Continue to use ice on the knee for pain and swelling from surgery. You may notice swelling that will progress down to the foot and ankle. This is normal after surgery. Elevate the leg when you are not up walking on it.    . Continue to use the breathing machine which will help keep your temperature down. It is common for your temperature to cycle up and down following surgery, especially at night when you are not up moving around and exerting yourself. The breathing machine keeps your lungs expanded and your temperature down. . Do not place pillow under the operative  knee, focus on keeping the knee straight while resting  DIET You may resume your previous home diet once you are discharged from the hospital.  DRESSING / [place] / SHOWERING . Remove the bulky dressing 2 days following surgery, this will include an ACE wrap and rolled gauze. Leave the tape strips across the incision in place until your first follow-up appointment. Cover the incision with 4x4 gauze and paper tape. Change the dressing daily with new gauze and paper tape for 7-10 days following surgery. . You may begin showering 3 days following surgery, but do not submerge the incision under water. . Allow water and soap to run over the incision, pat dry, and apply a new gauze dressing  ACTIVITY For the first 5 days, the key is rest and control of pain and swelling . Do your home exercises twice a day starting on post-operative day 3. On the days you go to physical therapy, just do the home exercises once that day. . You should rest, ice and elevate the leg for 50 minutes out of every hour. Get up and walk/stretch for 10 minutes per hour. After 5 days you can increase your activity slowly as tolerated. . Walk with your walker as instructed. Use the walker until you are comfortable transitioning to a cane. Walk with the cane in the opposite hand of the operative leg. You may discontinue the cane once you are comfortable and walking steadily. . Avoid periods of inactivity such as sitting longer than an hour when not asleep. This helps prevent blood clots.  . You may  discontinue the knee immobilizer once you are able to perform a straight leg raise while lying down. . You may resume a sexual relationship in one month or when given the OK by your doctor.  . You may return to work once you are cleared by your doctor.  . Do not drive a car for 6 weeks or until released by you surgeon.  . Do not drive while taking narcotics.  TED HOSE STOCKINGS Wear the elastic stockings on both legs for three  weeks following surgery during the day. You may remove them at night for sleeping.  WEIGHT BEARING Weight bearing as tolerated with assist device (walker, cane, etc) as directed, use it as long as suggested by your surgeon or therapist, typically at least 4-6 weeks.  POSTOPERATIVE CONSTIPATION PROTOCOL Constipation - defined medically as fewer than three stools per week and severe constipation as less than one stool per week.  One of the most common issues patients have following surgery is constipation.  Even if you have a regular bowel pattern at home, your normal regimen is likely to be disrupted due to multiple reasons following surgery.  Combination of anesthesia, postoperative narcotics, change in appetite and fluid intake all can affect your bowels.  In order to avoid complications following surgery, here are some recommendations in order to help you during your recovery period.  . Colace (docusate) - Pick up an over-the-counter form of Colace or another stool softener and take twice a day as long as you are requiring postoperative pain medications.  Take with a full glass of water daily.  If you experience loose stools or diarrhea, hold the colace until you stool forms back up. If your symptoms do not get better within 1 week or if they get worse, check with your doctor. . Dulcolax (bisacodyl) - Pick up over-the-counter and take as directed by the product packaging as needed to assist with the movement of your bowels.  Take with a full glass of water.  Use this product as needed if not relieved by Colace only.  . MiraLax (polyethylene glycol) - Pick up over-the-counter to have on hand. MiraLax is a solution that will increase the amount of water in your bowels to assist with bowel movements.  Take as directed and can mix with a glass of water, juice, soda, coffee, or tea. Take if you go more than two days without a movement. Do not use MiraLax more than once per day. Call your doctor if you are  still constipated or irregular after using this medication for 7 days in a row.  If you continue to have problems with postoperative constipation, please contact the office for further assistance and recommendations.  If you experience "the worst abdominal pain ever" or develop nausea or vomiting, please contact the office immediatly for further recommendations for treatment.  ITCHING If you experience itching with your medications, try taking only a single pain pill, or even half a pain pill at a time.  You can also use Benadryl over the counter for itching or also to help with sleep.   MEDICATIONS See your medication summary on the "After Visit Summary" that the nursing staff will review with you prior to discharge.  You may have some home medications which will be placed on hold until you complete the course of blood thinner medication.  It is important for you to complete the blood thinner medication as prescribed by your surgeon.  Continue your approved medications as instructed at time of  discharge.  PRECAUTIONS . If you experience chest pain or shortness of breath - call 911 immediately for transfer to the hospital emergency department.  . If you develop a fever greater that 101 F, purulent drainage from wound, increased redness or drainage from wound, foul odor from the wound/dressing, or calf pain - CONTACT YOUR SURGEON.                                                   FOLLOW-UP APPOINTMENTS Make sure you keep all of your appointments after your operation with your surgeon and caregivers. You should call the office at the above phone number and make an appointment for approximately two weeks after the date of your surgery or on the date instructed by your surgeon outlined in the "After Visit Summary".  RANGE OF MOTION AND STRENGTHENING EXERCISES  Rehabilitation of the knee is important following a knee injury or an operation. After just a few days of immobilization, the muscles of the  thigh which control the knee become weakened and shrink (atrophy). Knee exercises are designed to build up the tone and strength of the thigh muscles and to improve knee motion. Often times heat used for twenty to thirty minutes before working out will loosen up your tissues and help with improving the range of motion but do not use heat for the first two weeks following surgery. These exercises can be done on a training (exercise) mat, on the floor, on a table or on a bed. Use what ever works the best and is most comfortable for you Knee exercises include:  . Leg Lifts - While your knee is still immobilized in a splint or cast, you can do straight leg raises. Lift the leg to 60 degrees, hold for 3 sec, and slowly lower the leg. Repeat 10-20 times 2-3 times daily. Perform this exercise against resistance later as your knee gets better.  Katherine Lawrence and Hamstring Sets - Tighten up the muscle on the front of the thigh (Quad) and hold for 5-10 sec. Repeat this 10-20 times hourly. Hamstring sets are done by pushing the foot backward against an object and holding for 5-10 sec. Repeat as with quad sets.   Leg Slides: Lying on your back, slowly slide your foot toward your buttocks, bending your knee up off the floor (only go as far as is comfortable). Then slowly slide your foot back down until your leg is flat on the floor again.  Angel Wings: Lying on your back spread your legs to the side as far apart as you can without causing discomfort.  A rehabilitation program following serious knee injuries can speed recovery and prevent re-injury in the future due to weakened muscles. Contact your doctor or a physical therapist for more information on knee rehabilitation.   IF YOU ARE TRANSFERRED TO A SKILLED REHAB FACILITY If the patient is transferred to a skilled rehab facility following release from the hospital, a list of the current medications will be sent to the facility for the patient to continue.  When discharged  from the skilled rehab facility, please have the facility set up the patient's [place] prior to being released. Also, the skilled facility will be responsible for providing the patient with their medications at time of release from the facility to include their pain medication, the muscle relaxants,  and their blood thinner medication. If the patient is still at the rehab facility at time of the two week follow up appointment, the skilled rehab facility will also need to assist the patient in arranging follow up appointment in our office and any transportation needs.  MAKE SURE YOU:  . Understand these instructions.  . Get help right away if you are not doing well or get worse.    Pick up stool softner and laxative for home use following surgery while on pain medications. Do not submerge incision under water. Please use good hand washing techniques while changing dressing each day. May shower starting three days after surgery. Please use a clean towel to pat the incision dry following showers. Continue to use ice for pain and swelling after surgery. Do not use any lotions or creams on the incision until instructed by your surgeon.

## 2019-08-15 NOTE — Op Note (Signed)
NAME: Katherine Lawrence, Katherine Lawrence MEDICAL RECORD [id number] ACCOUNT [id number] DATE OF BIRTH:04-11-1956 FACILITY: [facility] LOCATION: [facility] PHYSICIAN:Maddix Heinz Zella Ball, MD  OPERATIVE REPORT  DATE OF PROCEDURE:  08/15/2019  PREOPERATIVE DIAGNOSIS:  Arthrofibrosis, left knee.  POSTOPERATIVE DIAGNOSIS:  Arthrofibrosis, left knee.  PROCEDURE:  Left knee arthrotomy and scar excision.  SURGEON:  Gaynelle Arabian, MD  ASSISTANT:  Theresa Duty, PA-C  ANESTHESIA:  General and adductor canal block.  ESTIMATED BLOOD LOSS:  Minimal.  DRAINS:  None.  COMPLICATIONS:  None.  TOURNIQUET TIME:  21 minutes at 300 mmHg.  CONDITION:  Stable to recovery.  BRIEF CLINICAL NOTE:  The patient is a 64 year old female with complex history in regard to her left knee.  She had a revision total knee arthroplasty for loosening and stiffness approximately 2 years ago.  She did have improvement in her range of  motion, but then recently has begun to lose motion.  Despite physical therapy, her range is only about 15-40 degrees.  She presents now for arthrotomy and scar excision in attempt to gain more range of motion.  PROCEDURE IN DETAIL:  After successful administration of adductor canal block and general anesthetic, a tourniquet was placed high on the left thigh.  Left lower extremity prepped and draped in the usual sterile fashion.  Extremity was wrapped in  Esmarch, tourniquet inflated to 300 mmHg.  The previous midline incision was utilized.  Skin cut with a 10 blade through subcutaneous tissue.  Subcutaneous flaps were created circumferentially, removing the adhesions in the subcutaneous tissues.  A fresh  blade was then used to make a medial arthrotomy.  We did not encounter any fluid in the joint.  She had very thickened scar throughout the underside of the extensor mechanism.  I started medially and debrided the scar down to normal tissue to recreate  the medial gutter and inferomedial gutter.  This effectively  freed up the tissue extremely well in the medial side.  We then completed the same process on the lateral side, recreating the lateral gutter, infrapatellar region.  I removed scarred up  infrapatellar fat pad.  The tissue was much more mobile.  I was then able to flex her knee down to between 95 and 100 degrees with minimal force.  The wound was then copiously irrigated with saline solution.  The arthrotomy was closed with a running 0  Stratafix suture.  Flexion against gravity at that point was 95 degrees.  Tourniquet was released, total time 21 minutes.  Minor bleeding was stopped with electrocautery.  Subcutaneous was closed with interrupted 2-0 Vicryl and subcuticular running 4-0  Monocryl.  Incisions cleaned and dried and Steri-Strips and a sterile dressing applied.  She was then awakened and transported to recovery in stable condition.  VN/NUANCE  D:08/15/2019 T:08/15/2019 JOB:[id number]

## 2019-08-15 NOTE — Anesthesia Postprocedure Evaluation (Signed)
Anesthesia Post Note  Patient: Katherine Lawrence  Procedure(s) Performed: Left knee arthrotomy; scar excision (Left Knee)     Patient location during evaluation: PACU Anesthesia Type: General Level of consciousness: awake and alert Pain management: pain level controlled Vital Signs Assessment: post-procedure vital signs reviewed and stable Respiratory status: spontaneous breathing, nonlabored ventilation, respiratory function stable and patient connected to nasal cannula oxygen Cardiovascular status: blood pressure returned to baseline and stable Postop Assessment: no apparent nausea or vomiting Anesthetic complications: no    Last Vitals:  Vitals:   08/15/19 1715 08/15/19 1730  BP: 129/69 126/71  Pulse: (!) 59 60  Resp: 18 14  Temp:    SpO2: 94% 95%    Last Pain:  Vitals:   08/15/19 1715  TempSrc:   PainSc: 8                  [name] S

## 2019-08-15 NOTE — Interval H&P Note (Signed)
History and Physical Interval Note:  08/15/2019 12:50 PM  Katherine Lawrence  has presented today for surgery, with the diagnosis of arthrofibrosis left knee.  The various methods of treatment have been discussed with the patient and family. After consideration of risks, benefits and other options for treatment, the patient has consented to  Procedure(s) with comments: Left knee arthrotomy; scar excision (Left) - 54min as a surgical intervention.  The patient's history has been reviewed, patient examined, no change in status, stable for surgery.  I have reviewed the patient's chart and labs.  Questions were answered to the patient's satisfaction.     Pilar Plate Wake [place]

## 2019-08-16 DIAGNOSIS — M24662 Ankylosis, left knee: Secondary | ICD-10-CM | POA: Diagnosis not present

## 2019-08-16 DIAGNOSIS — Z79899 Other long term (current) drug therapy: Secondary | ICD-10-CM | POA: Diagnosis not present

## 2019-08-16 DIAGNOSIS — F329 Major depressive disorder, single episode, unspecified: Secondary | ICD-10-CM | POA: Diagnosis not present

## 2019-08-16 DIAGNOSIS — I10 Essential (primary) hypertension: Secondary | ICD-10-CM | POA: Diagnosis not present

## 2019-08-16 DIAGNOSIS — Z9889 Other specified postprocedural states: Secondary | ICD-10-CM | POA: Diagnosis not present

## 2019-08-16 DIAGNOSIS — E7800 Pure hypercholesterolemia, unspecified: Secondary | ICD-10-CM | POA: Diagnosis not present

## 2019-08-16 DIAGNOSIS — E119 Type 2 diabetes mellitus without complications: Secondary | ICD-10-CM | POA: Diagnosis not present

## 2019-08-16 DIAGNOSIS — Z7982 Long term (current) use of aspirin: Secondary | ICD-10-CM | POA: Diagnosis not present

## 2019-08-16 DIAGNOSIS — Z20822 Contact with and (suspected) exposure to covid-19: Secondary | ICD-10-CM | POA: Diagnosis not present

## 2019-08-16 DIAGNOSIS — M1990 Unspecified osteoarthritis, unspecified site: Secondary | ICD-10-CM | POA: Diagnosis not present

## 2019-08-16 DIAGNOSIS — Z7984 Long term (current) use of oral hypoglycemic drugs: Secondary | ICD-10-CM | POA: Diagnosis not present

## 2019-08-16 LAB — CBC
HCT: 43.5 % (ref 36.0–46.0)
Hemoglobin: 13.1 g/dL (ref 12.0–15.0)
MCH: 29.2 pg (ref 26.0–34.0)
MCHC: 30.1 g/dL (ref 30.0–36.0)
MCV: 96.9 fL (ref 80.0–100.0)
Platelets: 193 10*3/uL (ref 150–400)
RBC: 4.49 MIL/uL (ref 3.87–5.11)
RDW: 13.2 % (ref 11.5–15.5)
WBC: 8.1 10*3/uL (ref 4.0–10.5)
nRBC: 0 % (ref 0.0–0.2)

## 2019-08-16 LAB — BASIC METABOLIC PANEL
Anion gap: 10 (ref 5–15)
BUN: 16 mg/dL (ref 8–23)
CO2: 25 mmol/L (ref 22–32)
Calcium: 8.5 mg/dL — ABNORMAL LOW (ref 8.9–10.3)
Chloride: 104 mmol/L (ref 98–111)
Creatinine, Ser: 0.56 mg/dL (ref 0.44–1.00)
GFR calc Af Amer: >60 mL/min (ref >60)
GFR calc non Af Amer: >60 mL/min (ref >60)
Glucose, Bld: 130 mg/dL — ABNORMAL HIGH (ref 70–99)
Potassium: 4 mmol/L (ref 3.5–5.1)
Sodium: 139 mmol/L (ref 135–145)

## 2019-08-16 NOTE — Evaluation (Signed)
Physical Therapy Evaluation Patient Details Name: Katherine Lawrence MRN: [id number] DOB: [birth year] Today's Date: 08/16/2019   History of Present Illness  Left knee arthrotomy; scar excision PMH: bil THA, DM, L TKA, L knee closed manipulation  Clinical Impression  Patient evaluated by Physical Therapy with no further acute PT needs identified. All education has been completed and the patient has no further questions.  Reviewed knee ROM/exercises, use of gait belt for stretching--pt able to return demonstrate. Discussed use of foam roller at home. Advised to use RW initially for safety until pain with WBing improved.  See below for any follow-up Physical Therapy or equipment needs. PT is signing off. Thank you for this referral.     Follow Up Recommendations Follow surgeon's recommendation for DC plan and follow-up therapies    Equipment Recommendations  None recommended by PT    Recommendations for Other Services       Precautions / Restrictions Precautions Precautions: Fall;Knee Restrictions Weight Bearing Restrictions: No LLE Weight Bearing: Weight bearing as tolerated      Mobility  Bed Mobility Overal bed mobility: Needs Assistance Bed Mobility: Supine to Sit;Sit to Supine     Supine to sit: Supervision Sit to supine: Supervision   General bed mobility comments: for safety, incr time, instructed in use of gait belt to self assist  Transfers Overall transfer level: Needs assistance Equipment used: Rolling walker (2 wheeled) Transfers: Sit to/from Stand Sit to Stand: Min guard;Supervision         General transfer comment: cues for hand placement  Ambulation/Gait Ambulation/Gait assistance: Min guard;Supervision Gait Distance (Feet): 120 Feet Assistive device: Rolling walker (2 wheeled) Gait Pattern/deviations: Step-to pattern;Step-through pattern;Decreased stance time - left;Decreased weight shift to left Gait velocity: decr   General Gait Details: cues for  knee extension in stance on L, heel strike and incr step length on R.  amb ~5' with cane however d/t incr pain/antalgic gait switched to RW. improved safety and gait pattern with useof RW  Stairs Stairs: Yes Stairs assistance: Min guard Stair Management: One rail Right;One rail Left;Step to pattern;Forwards;With cane;Sideways Number of Stairs: 3 General stair comments: up/down 3 steps x 2; sideways with one rail; rail and cane on second trial, improved pain control with cane and rail  Wheelchair Mobility    Modified Rankin (Stroke Patients Only)       Balance Overall balance assessment: Needs assistance   Sitting balance-Leahy Scale: Good       Standing balance-Leahy Scale: Fair                               Pertinent Vitals/Pain Pain Assessment: 0-10 Pain Score: 3  Pain Location: L knee Pain Descriptors / Indicators: Guarding;Sore Pain Intervention(s): Monitored during session;Limited activity within patient's tolerance;Ice applied    Home Living Family/patient expects to be discharged to:: Private residence Living Arrangements: Spouse/significant other Available Help at Discharge: Family Type of Home: House Home Access: Stairs to enter Entrance Stairs-Rails: Psychiatric nurse of Steps: 2 Home Layout: One level Home Equipment: Environmental consultant - 2 wheels;Cane - single point;Bedside commode      Prior Function Level of Independence: Independent;Independent with assistive device(s)         Comments: amb with cane at times d/t L knee pain/limitations     Hand Dominance        Extremity/Trunk Assessment   Upper Extremity Assessment Upper Extremity Assessment: Overall WFL for tasks assessed  Lower Extremity Assessment Lower Extremity Assessment: LLE deficits/detail LLE Deficits / Details: strength 2+ to 3/5, limited by post op weakness/pain. AAROM grossly  15 to 60 degrees flexion       Communication   Communication: No  difficulties  Cognition Arousal/Alertness: Awake/alert Behavior During Therapy: WFL for tasks assessed/performed Overall Cognitive Status: Within Functional Limits for tasks assessed                                 General Comments: pt feels she is in an "anethesia fog", slightly delayed process at times, Ox3      General Comments      Exercises Total Joint Exercises Quad Sets: Both;10 reps Heel Slides: AAROM;10 reps;Left Straight Leg Raises: AROM;AAROM;10 reps;Left   Assessment/Plan    PT Assessment All further PT needs can be met in the next venue of care  PT Problem List         PT Treatment Interventions      PT Goals (Current goals can be found in the Care Plan section)  Acute Rehab PT Goals PT Goal Formulation: All assessment and education complete, DC therapy    Frequency     Barriers to discharge        Co-evaluation               AM-PAC PT "6 Clicks" Mobility  Outcome Measure Help needed turning from your back to your side while in a flat bed without using bedrails?: None Help needed moving from lying on your back to sitting on the side of a flat bed without using bedrails?: None Help needed moving to and from a bed to a chair (including a wheelchair)?: A Little Help needed standing up from a chair using your arms (e.g., wheelchair or bedside chair)?: A Little Help needed to walk in hospital room?: A Little Help needed climbing 3-5 steps with a railing? : A Little 6 Click Score: 20    End of Session Equipment Utilized During Treatment: Gait belt Activity Tolerance: Patient tolerated treatment well Patient left: in bed;with call Lall/phone within reach Nurse Communication: Mobility status PT Visit Diagnosis: Difficulty in walking, not elsewhere classified (R26.2)    Time: 4492-0100 PT Time Calculation (min) (ACUTE ONLY): 24 min   Charges:   PT Evaluation $PT Eval Low Complexity: 1 Low PT Treatments $Gait Training: 8-22 mins         Baxter Flattery, PT   Acute Rehab Dept [facility]): [phone]   08/16/2019   [facility] 08/16/2019, 1:38 PM

## 2019-08-16 NOTE — Plan of Care (Signed)
  Problem: Education: Goal: Knowledge of General Education information will improve Description: Including pain rating scale, medication(s)/side effects and non-pharmacologic comfort measures Outcome: Adequate for Discharge   Problem: Health Behavior/Discharge Planning: Goal: Ability to manage health-related needs will improve Outcome: Adequate for Discharge   Problem: Clinical Measurements: Goal: Ability to maintain clinical measurements within normal limits will improve Outcome: Adequate for Discharge Goal: Will remain free from infection Outcome: Adequate for Discharge Goal: Diagnostic test results will improve Outcome: Adequate for Discharge Goal: Respiratory complications will improve Outcome: Adequate for Discharge Goal: Cardiovascular complication will be avoided Outcome: Adequate for Discharge   Problem: Activity: Goal: Risk for activity intolerance will decrease Outcome: Adequate for Discharge   Problem: Nutrition: Goal: Adequate nutrition will be maintained Outcome: Adequate for Discharge   Problem: Coping: Goal: Level of anxiety will decrease Outcome: Adequate for Discharge   Problem: Coping: Goal: Level of anxiety will decrease Outcome: Adequate for Discharge   Problem: Elimination: Goal: Will not experience complications related to bowel motility Outcome: Adequate for Discharge Goal: Will not experience complications related to urinary retention Outcome: Adequate for Discharge   Problem: Pain Managment: Goal: General experience of comfort will improve Outcome: Adequate for Discharge   Problem: Safety: Goal: Ability to remain free from injury will improve Outcome: Adequate for Discharge   Problem: Skin Integrity: Goal: Risk for impaired skin integrity will decrease Outcome: Adequate for Discharge   

## 2019-08-16 NOTE — Progress Notes (Signed)
   Subjective: 1 Day Post-Op Procedure(s) (LRB): Left knee arthrotomy; scar excision (Left) Patient reports pain as mild.   Patient seen in rounds by Dr. Wynelle Link. Patient is well, and has had no acute complaints or problems. States she is ready to go home. Denies chest pain, SOB, or calf pain. Voiding without difficulty. Patient had low oxygen sats yesterday following surgery, was unable to safely discharge. Much improved this AM.  Objective: Vital signs in last 24 hours: Temp:  [97.4 F (36.3 C)-99.4 F (37.4 C)] 97.4 F (36.3 C) (03/02 0552) Pulse Rate:  [54-78] 58 (03/02 0552) Resp:  [12-22] 18 (03/02 0552) BP: (107-167)/(53-98) 128/76 (03/02 0552) SpO2:  [91 %-100 %] 100 % (03/02 0552) Weight:  [93.4 kg] 93.4 kg (03/01 1233)  Intake/Output from previous day:  Intake/Output Summary (Last 24 hours) at 08/16/2019 0742 Last data filed at 08/16/2019 [id number] Gross per 24 hour  Intake 2434.32 ml  Output --  Net 2434.32 ml    Labs: Recent Labs    08/16/19 0335  HGB 13.1   Recent Labs    08/16/19 0335  WBC 8.1  RBC 4.49  HCT 43.5  PLT 193   Recent Labs    08/16/19 0335  NA 139  K 4.0  CL 104  CO2 25  BUN 16  CREATININE 0.56  GLUCOSE 130*  CALCIUM 8.5*   Exam: General - Patient is Alert and Oriented Extremity - Neurologically intact Neurovascular intact Sensation intact distally Dorsiflexion/Plantar flexion intact Dressing - dressing C/D/I Motor Function - intact, moving foot and toes well on exam.   Past Medical History:  Diagnosis Date  . Allergy   . CIN I (cervical intraepithelial neoplasia I) 1996   cryo...  cone of cervix  . DEPRESSION   . Diabetes type 2, controlled ([place])   . Dysrhythmia   . Endometrial polyp 07/21/2011  . Glaucoma   . Heart murmur    age 64   . HYPERCHOLESTEROLEMIA   . HYPERTENSION   . Osteoarthritis    hips and knees  . Palpitations 08/05/2019  . Pneumonia 06/2003  . Postsurgical menopause     Assessment/Plan: 1 Day  Post-Op Procedure(s) (LRB): Left knee arthrotomy; scar excision (Left) Active Problems:   Status post left knee surgery  Estimated body mass index is 35.36 kg/m as calculated from the following:   Height as of this encounter: 5\' 4"  (1.626 m).   Weight as of this encounter: 93.4 kg. Advance diet Up with therapy D/C IV fluids  DVT Prophylaxis - Aspirin Weight bearing as tolerated. D/C O2 and pulse ox and try on room air. Will work with therapy for one session.  Plan is to go Home after hospital stay. Plan for discharge home with outpatient PT at [facility]. Follow-up in 2 weeks.   Theresa Duty, PA-C Orthopedic Surgery [phone] 08/16/2019, 7:42 AM

## 2019-08-16 NOTE — Progress Notes (Signed)
Therapy Plan: OPPT Has DME  

## 2019-08-17 NOTE — Discharge Summary (Signed)
Physician Discharge Summary   Patient ID: Katherine Lawrence MRN: [id number] DOB/AGE: 06/27/1955 65 y.o.  Admit date: 08/15/2019 Discharge date: 08/16/2019  Primary Diagnosis: Arthrofibrosis, left knee   Admission Diagnoses:  Past Medical History:  Diagnosis Date  . Allergy   . CIN I (cervical intraepithelial neoplasia I) 1996   cryo...  cone of cervix  . DEPRESSION   . Diabetes type 2, controlled ([place])   . Dysrhythmia   . Endometrial polyp 07/21/2011  . Glaucoma   . Heart murmur    age 70   . HYPERCHOLESTEROLEMIA   . HYPERTENSION   . Osteoarthritis    hips and knees  . Palpitations 08/05/2019  . Pneumonia 06/2003  . Postsurgical menopause    Discharge Diagnoses:   Active Problems:   Status post left knee surgery  Estimated body mass index is 35.36 kg/m as calculated from the following:   Height as of this encounter: '5\' 4"'$  (1.626 m).   Weight as of this encounter: 93.4 kg.  Procedure:  Procedure(s) (LRB): Left knee arthrotomy; scar excision (Left)   Consults: None  HPI: The patient is a 64 year old female with complex history in regard to her left knee.  She had a revision total knee arthroplasty for loosening and stiffness approximately 2 years ago.  She did have improvement in her range of motion, but then recently has begun to lose motion.  Despite physical therapy, her range is only about 15-40 degrees.  She presents now for arthrotomy and scar excision in attempt to gain more range of motion.   Laboratory Data: Admission on 08/15/2019, Discharged on 08/16/2019  Component Date Value Ref Range Status  . MRSA, PCR 08/08/2019 NEGATIVE  NEGATIVE Final  . Staphylococcus aureus 08/08/2019 NEGATIVE  NEGATIVE Final   Comment: (NOTE) The Xpert SA Assay (FDA approved for NASAL specimens in patients 27 years of age and older), is one component of a comprehensive surveillance program. It is not intended to diagnose infection nor to guide or monitor treatment. Performed at  [facility], [place] [address], [place], [place] [address]   . Glucose-Capillary 08/15/2019 94  70 - 99 mg/dL Final   Glucose reference range applies only to samples taken after fasting for at least 8 hours.  . Glucose-Capillary 08/15/2019 107* 70 - 99 mg/dL Final   Glucose reference range applies only to samples taken after fasting for at least 8 hours.  . WBC 08/16/2019 8.1  4.0 - 10.5 K/uL Final  . RBC 08/16/2019 4.49  3.87 - 5.11 MIL/uL Final  . Hemoglobin 08/16/2019 13.1  12.0 - 15.0 g/dL Final  . HCT 08/16/2019 43.5  36.0 - 46.0 % Final  . MCV 08/16/2019 96.9  80.0 - 100.0 fL Final  . MCH 08/16/2019 29.2  26.0 - 34.0 pg Final  . MCHC 08/16/2019 30.1  30.0 - 36.0 g/dL Final  . RDW 08/16/2019 13.2  11.5 - 15.5 % Final  . Platelets 08/16/2019 193  150 - 400 K/uL Final  . nRBC 08/16/2019 0.0  0.0 - 0.2 % Final   Performed at [facility], [place] [address], [place], [place] [address]  . Sodium 08/16/2019 139  135 - 145 mmol/L Final  . Potassium 08/16/2019 4.0  3.5 - 5.1 mmol/L Final  . Chloride 08/16/2019 104  98 - 111 mmol/L Final  . CO2 08/16/2019 25  22 - 32 mmol/L Final  . Glucose, Bld 08/16/2019 130* 70 - 99 mg/dL Final   Glucose reference range applies  only to samples taken after fasting for at least 8 hours.  . BUN 08/16/2019 16  8 - 23 mg/dL Final  . Creatinine, Ser 08/16/2019 0.56  0.44 - 1.00 mg/dL Final  . Calcium 08/16/2019 8.5* 8.9 - 10.3 mg/dL Final  . GFR calc non Af Amer 08/16/2019 >60  >60 mL/min Final  . GFR calc Af Amer 08/16/2019 >60  >60 mL/min Final  . Anion gap 08/16/2019 10  5 - 15 Final   Performed at [facility], [place] [address], [place], [place] [address]  Hospital Outpatient Visit on 08/11/2019  Component Date Value Ref Range Status  . SARS Coronavirus 2 08/11/2019 NEGATIVE  NEGATIVE Final   Comment: (NOTE) SARS-CoV-2 target nucleic acids are NOT DETECTED. The SARS-CoV-2 RNA is generally detectable  in upper and lower respiratory specimens during the acute phase of infection. Negative results do not preclude SARS-CoV-2 infection, do not rule out co-infections with other pathogens, and should not be used as the sole basis for treatment or other patient management decisions. Negative results must be combined with clinical observations, patient history, and epidemiological information. The expected result is Negative. Fact Sheet for Patients: SugarRoll.be Fact Sheet for Healthcare Providers: https://www.woods-mathews.com/ This test is not yet approved or cleared by the [place] FDA and  has been authorized for detection and/or diagnosis of SARS-CoV-2 by FDA under an Emergency Use Authorization (EUA). This EUA will remain  in effect (meaning this test can be used) for the duration of the COVID-19 declaration under Section 56                          4(b)(1) of the Act, 21 U.S.C. section 360bbb-3(b)(1), unless the authorization is terminated or revoked sooner. Performed at [facility], [place] [address] [place], [place] [address]   Admission on 08/05/2019, Discharged on 08/05/2019  Component Date Value Ref Range Status  . Glucose-Capillary 08/05/2019 106* 70 - 99 mg/dL Final  . WBC 08/05/2019 5.8  4.0 - 10.5 K/uL Final  . RBC 08/05/2019 4.85  3.87 - 5.11 MIL/uL Final  . Hemoglobin 08/05/2019 14.4  12.0 - 15.0 g/dL Final  . HCT 08/05/2019 45.8  36.0 - 46.0 % Final  . MCV 08/05/2019 94.4  80.0 - 100.0 fL Final  . MCH 08/05/2019 29.7  26.0 - 34.0 pg Final  . MCHC 08/05/2019 31.4  30.0 - 36.0 g/dL Final  . RDW 08/05/2019 13.3  11.5 - 15.5 % Final  . Platelets 08/05/2019 196  150 - 400 K/uL Final  . nRBC 08/05/2019 0.0  0.0 - 0.2 % Final  . Neutrophils Relative % 08/05/2019 73  % Final  . Neutro Abs 08/05/2019 4.2  1.7 - 7.7 K/uL Final  . Lymphocytes Relative 08/05/2019 20  % Final  . Lymphs Abs 08/05/2019 1.2  0.7 - 4.0 K/uL Final   . Monocytes Relative 08/05/2019 5  % Final  . Monocytes Absolute 08/05/2019 0.3  0.1 - 1.0 K/uL Final  . Eosinophils Relative 08/05/2019 2  % Final  . Eosinophils Absolute 08/05/2019 0.1  0.0 - 0.5 K/uL Final  . Basophils Relative 08/05/2019 0  % Final  . Basophils Absolute 08/05/2019 0.0  0.0 - 0.1 K/uL Final  . Immature Granulocytes 08/05/2019 0  % Final  . Abs Immature Granulocytes 08/05/2019 0.01  0.00 - 0.07 K/uL Final   Performed at [facility], [place] [address], [place], [place] [address]  . Sodium 08/05/2019  141  135 - 145 mmol/L Final  . Potassium 08/05/2019 4.3  3.5 - 5.1 mmol/L Final  . Chloride 08/05/2019 105  98 - 111 mmol/L Final  . CO2 08/05/2019 27  22 - 32 mmol/L Final  . Glucose, Bld 08/05/2019 125* 70 - 99 mg/dL Final  . BUN 08/05/2019 17  8 - 23 mg/dL Final  . Creatinine, Ser 08/05/2019 0.45  0.44 - 1.00 mg/dL Final  . Calcium 08/05/2019 8.7* 8.9 - 10.3 mg/dL Final  . Total Protein 08/05/2019 7.0  6.5 - 8.1 g/dL Final  . Albumin 08/05/2019 4.3  3.5 - 5.0 g/dL Final  . AST 08/05/2019 15  15 - 41 U/L Final  . ALT 08/05/2019 13  0 - 44 U/L Final  . Alkaline Phosphatase 08/05/2019 51  38 - 126 U/L Final  . Total Bilirubin 08/05/2019 0.7  0.3 - 1.2 mg/dL Final  . GFR calc non Af Amer 08/05/2019 >60  >60 mL/min Final  . GFR calc Af Amer 08/05/2019 >60  >60 mL/min Final  . Anion gap 08/05/2019 9  5 - 15 Final   Performed at [facility], [place] [address], [place], [place] [address]  . Troponin I (High Sensitivity) 08/05/2019 4  <18 ng/L Final   Comment: (NOTE) Elevated high sensitivity troponin I (hsTnI) values and significant  changes across serial measurements may suggest ACS but many other  chronic and acute conditions are known to elevate hsTnI results.  Refer to the "Links" section for chest pain algorithms and additional  guidance. Performed at [facility], [place] [address], [place], [place] [address]   .  Troponin I (High Sensitivity) 08/05/2019 4  <18 ng/L Final   Comment: (NOTE) Elevated high sensitivity troponin I (hsTnI) values and significant  changes across serial measurements may suggest ACS but many other  chronic and acute conditions are known to elevate hsTnI results.  Refer to the "Links" section for chest pain algorithms and additional  guidance. Performed at [facility], [place] [address], [place], [place] [address]      X-Rays:DG Chest 2 View  Result Date: 08/05/2019 CLINICAL DATA:  Pt has complaints of dizziness and a "weird sensation" on the left side of her chest that started this morning. Pt reports feeling like she was going to pass out. Pt has a hx of diabetes and hypertension. EXAM: CHEST - 2 VIEW COMPARISON:  10/27/2018 FINDINGS: Cardiac silhouette is normal in size. No mediastinal or hilar masses. No evidence of adenopathy. Clear lungs.  No pleural effusion or pneumothorax. Skeletal structures are intact. IMPRESSION: No active cardiopulmonary disease. Electronically Signed   By: Lajean Manes M.D.   On: 08/05/2019 10:39    EKG: Orders placed or performed during the hospital encounter of 08/05/19  . EKG 12-Lead  . EKG 12-Lead  . EKG     Hospital Course: Katherine Lawrence is a 64 y.o. who was admitted to [facility]. They were brought to the operating room on 08/15/2019 and underwent Procedure(s): Left knee arthrotomy; scar excision.  Patient tolerated the procedure well and was later transferred to the recovery room and then to the orthopaedic floor for postoperative care. They were given PO and IV analgesics for pain control following their surgery. They were given 24 hours of postoperative antibiotics of  Anti-infectives (From admission, onward)   Start     Dose/Rate Route Frequency Ordered Stop   08/15/19 2100  ceFAZolin (ANCEF) IVPB 2g/100 mL premix  2 g 200 mL/hr over 30 Minutes Intravenous Every 6 hours 08/15/19 2016 08/16/19 0654    08/15/19 1245  ceFAZolin (ANCEF) IVPB 2g/100 mL premix     2 g 200 mL/hr over 30 Minutes Intravenous On call to O.R. 08/15/19 1228 08/15/19 1456     and started on DVT prophylaxis in the form of Aspirin.   Physical therapy was ordered. Discharge planning consulted to help with postop disposition and equipment needs.  Patient had a good night on the evening of surgery. They started to get up OOB with therapy on POD #1. Pt was seen during rounds and was ready to go home after one session of therapy. Pt was discharged to home later that day in stable condition.  Diet: Diabetic diet Activity: WBAT Follow-up: in 2 weeks Disposition: Home with outpatient physical therapy Discharged Condition: stable   Discharge Instructions    Call MD / Call 911   Complete by: As directed    If you experience chest pain or shortness of breath, CALL 911 and be transported to the hospital emergency room.  If you develope a fever above 101 F, pus (white drainage) or increased drainage or redness at the wound, or calf pain, call your surgeon's office.   Change dressing   Complete by: As directed    Change dressing on Wednesday, then change the dressing daily with sterile 4 x 4 inch gauze dressing and apply TED hose.   Constipation Prevention   Complete by: As directed    Drink plenty of fluids.  Prune juice may be helpful.  You may use a stool softener, such as Colace (over the counter) 100 mg twice a day.  Use MiraLax (over the counter) for constipation as needed.   Diet - low sodium heart healthy   Complete by: As directed    Do not put a pillow under the knee. Place it under the heel.   Complete by: As directed    Driving restrictions   Complete by: As directed    No driving for two weeks   TED hose   Complete by: As directed    Use stockings (TED hose) for three weeks on both leg(s).  You may remove them at night for sleeping.   Weight bearing as tolerated   Complete by: As directed      Allergies as  of 08/16/2019      Reactions   Pollen Extract    Other reaction(s): Eye redness, Eye swelling   Bee Pollen Swelling, Other (See Comments)   redeyes      Medication List    STOP taking these medications   tiZANidine 2 MG tablet Commonly known as: ZANAFLEX     TAKE these medications   amLODipine 5 MG tablet Commonly known as: NORVASC TAKE 1 TABLET(5 MG) BY MOUTH DAILY What changed: See the new instructions.   aspirin EC 325 MG tablet Take 1 tablet (325 mg total) by mouth daily for 21 days. Then resume one 81 mg aspirin once a day. What changed:   medication strength  how much to take  when to take this  additional instructions   atorvastatin 40 MG tablet Commonly known as: LIPITOR Take 1 tablet (40 mg total) by mouth daily.   blood glucose meter kit and supplies Kit Dispense based on patient and insurance preference. Use up to four times daily as directed. (FOR E11.9).   citalopram 40 MG tablet Commonly known as: CELEXA Take 1 tablet (40 mg total) by  mouth daily. Annual appt is due must see provider for future refills What changed: additional instructions   losartan 50 MG tablet Commonly known as: COZAAR Take 1 tablet (50 mg total) by mouth daily.   metFORMIN 500 MG tablet Commonly known as: GLUCOPHAGE TAKE 1 TABLET BY MOUTH TWICE DAILY WITH A MEAL What changed: See the new instructions.   methocarbamol 500 MG tablet Commonly known as: Robaxin Take 1 tablet (500 mg total) by mouth every 6 (six) hours as needed for muscle spasms.   oxyCODONE 5 MG immediate release tablet Commonly known as: Roxicodone Take 1-2 tablets (5-10 mg total) by mouth every 6 (six) hours as needed for moderate pain or severe pain.   vitamin B-12 1000 MCG tablet Commonly known as: CYANOCOBALAMIN Take 1,000 mcg by mouth daily.            Discharge Care Instructions  (From admission, onward)         Start     Ordered   08/16/19 0000  Weight bearing as tolerated     08/16/19  0744   08/16/19 0000  Change dressing    Comments: Change dressing on Wednesday, then change the dressing daily with sterile 4 x 4 inch gauze dressing and apply TED hose.   08/16/19 0744         Follow-up Information    Gaynelle Arabian, MD. Schedule an appointment as soon as possible for a visit on 08/30/2019.   Specialty: Orthopedic Surgery Contact information: [address] [place] [address] [phone]           Signed: Theresa Duty, PA-C Orthopedic Surgery 08/17/2019, 1:27 PM

## 2019-08-18 DIAGNOSIS — M25662 Stiffness of left knee, not elsewhere classified: Secondary | ICD-10-CM | POA: Diagnosis not present

## 2019-08-18 DIAGNOSIS — M25562 Pain in left knee: Secondary | ICD-10-CM | POA: Diagnosis not present

## 2019-08-19 DIAGNOSIS — M25662 Stiffness of left knee, not elsewhere classified: Secondary | ICD-10-CM | POA: Diagnosis not present

## 2019-08-19 DIAGNOSIS — M25562 Pain in left knee: Secondary | ICD-10-CM | POA: Diagnosis not present

## 2019-08-22 DIAGNOSIS — M25562 Pain in left knee: Secondary | ICD-10-CM | POA: Diagnosis not present

## 2019-08-22 DIAGNOSIS — M25662 Stiffness of left knee, not elsewhere classified: Secondary | ICD-10-CM | POA: Diagnosis not present

## 2019-08-23 ENCOUNTER — Encounter: Payer: BC Managed Care – PPO | Admitting: Psychology

## 2019-08-24 DIAGNOSIS — M25662 Stiffness of left knee, not elsewhere classified: Secondary | ICD-10-CM | POA: Diagnosis not present

## 2019-08-24 DIAGNOSIS — M25562 Pain in left knee: Secondary | ICD-10-CM | POA: Diagnosis not present

## 2019-08-26 DIAGNOSIS — M25662 Stiffness of left knee, not elsewhere classified: Secondary | ICD-10-CM | POA: Diagnosis not present

## 2019-08-26 DIAGNOSIS — M25562 Pain in left knee: Secondary | ICD-10-CM | POA: Diagnosis not present

## 2019-08-29 DIAGNOSIS — M25562 Pain in left knee: Secondary | ICD-10-CM | POA: Diagnosis not present

## 2019-08-29 DIAGNOSIS — M25662 Stiffness of left knee, not elsewhere classified: Secondary | ICD-10-CM | POA: Diagnosis not present

## 2019-08-30 ENCOUNTER — Encounter: Payer: BC Managed Care – PPO | Admitting: Psychology

## 2019-08-31 ENCOUNTER — Ambulatory Visit: Payer: BC Managed Care – PPO | Admitting: Internal Medicine

## 2019-08-31 DIAGNOSIS — M25662 Stiffness of left knee, not elsewhere classified: Secondary | ICD-10-CM | POA: Diagnosis not present

## 2019-08-31 DIAGNOSIS — M25562 Pain in left knee: Secondary | ICD-10-CM | POA: Diagnosis not present

## 2019-09-02 DIAGNOSIS — M25562 Pain in left knee: Secondary | ICD-10-CM | POA: Diagnosis not present

## 2019-09-02 DIAGNOSIS — M25662 Stiffness of left knee, not elsewhere classified: Secondary | ICD-10-CM | POA: Diagnosis not present

## 2019-09-05 DIAGNOSIS — M25562 Pain in left knee: Secondary | ICD-10-CM | POA: Diagnosis not present

## 2019-09-05 DIAGNOSIS — M25662 Stiffness of left knee, not elsewhere classified: Secondary | ICD-10-CM | POA: Diagnosis not present

## 2019-09-07 DIAGNOSIS — M25562 Pain in left knee: Secondary | ICD-10-CM | POA: Diagnosis not present

## 2019-09-07 DIAGNOSIS — M25662 Stiffness of left knee, not elsewhere classified: Secondary | ICD-10-CM | POA: Diagnosis not present

## 2019-09-09 DIAGNOSIS — M25562 Pain in left knee: Secondary | ICD-10-CM | POA: Diagnosis not present

## 2019-09-09 DIAGNOSIS — M25662 Stiffness of left knee, not elsewhere classified: Secondary | ICD-10-CM | POA: Diagnosis not present

## 2019-09-12 DIAGNOSIS — M25662 Stiffness of left knee, not elsewhere classified: Secondary | ICD-10-CM | POA: Diagnosis not present

## 2019-09-12 DIAGNOSIS — M25562 Pain in left knee: Secondary | ICD-10-CM | POA: Diagnosis not present

## 2019-09-14 DIAGNOSIS — M25662 Stiffness of left knee, not elsewhere classified: Secondary | ICD-10-CM | POA: Diagnosis not present

## 2019-09-14 DIAGNOSIS — M25562 Pain in left knee: Secondary | ICD-10-CM | POA: Diagnosis not present

## 2019-09-16 DIAGNOSIS — M25562 Pain in left knee: Secondary | ICD-10-CM | POA: Diagnosis not present

## 2019-09-16 DIAGNOSIS — M25662 Stiffness of left knee, not elsewhere classified: Secondary | ICD-10-CM | POA: Diagnosis not present

## 2019-09-23 DIAGNOSIS — M25562 Pain in left knee: Secondary | ICD-10-CM | POA: Diagnosis not present

## 2019-09-23 DIAGNOSIS — M25662 Stiffness of left knee, not elsewhere classified: Secondary | ICD-10-CM | POA: Diagnosis not present

## 2019-09-28 DIAGNOSIS — M25562 Pain in left knee: Secondary | ICD-10-CM | POA: Diagnosis not present

## 2019-09-28 DIAGNOSIS — M25662 Stiffness of left knee, not elsewhere classified: Secondary | ICD-10-CM | POA: Diagnosis not present

## 2019-09-30 DIAGNOSIS — M25562 Pain in left knee: Secondary | ICD-10-CM | POA: Diagnosis not present

## 2019-09-30 DIAGNOSIS — M25662 Stiffness of left knee, not elsewhere classified: Secondary | ICD-10-CM | POA: Diagnosis not present

## 2019-10-03 DIAGNOSIS — M25662 Stiffness of left knee, not elsewhere classified: Secondary | ICD-10-CM | POA: Diagnosis not present

## 2019-10-03 DIAGNOSIS — M25562 Pain in left knee: Secondary | ICD-10-CM | POA: Diagnosis not present

## 2019-10-05 DIAGNOSIS — M25662 Stiffness of left knee, not elsewhere classified: Secondary | ICD-10-CM | POA: Diagnosis not present

## 2019-10-05 DIAGNOSIS — M25562 Pain in left knee: Secondary | ICD-10-CM | POA: Diagnosis not present

## 2019-10-06 ENCOUNTER — Other Ambulatory Visit: Payer: Self-pay | Admitting: Internal Medicine

## 2019-10-07 DIAGNOSIS — M25562 Pain in left knee: Secondary | ICD-10-CM | POA: Diagnosis not present

## 2019-10-07 DIAGNOSIS — M25662 Stiffness of left knee, not elsewhere classified: Secondary | ICD-10-CM | POA: Diagnosis not present

## 2019-10-27 ENCOUNTER — Encounter: Payer: Self-pay | Admitting: Internal Medicine

## 2019-10-27 NOTE — Progress Notes (Signed)
Outside notes received. Information abstracted. Notes sent to scan.  

## 2019-12-06 DIAGNOSIS — Z8673 Personal history of transient ischemic attack (TIA), and cerebral infarction without residual deficits: Secondary | ICD-10-CM | POA: Insufficient documentation

## 2019-12-06 HISTORY — DX: Personal history of transient ischemic attack (TIA), and cerebral infarction without residual deficits: Z86.73

## 2019-12-06 NOTE — Progress Notes (Signed)
Subjective:    Patient ID: Katherine Lawrence, female    DOB: Jun 24, 1955, 64 y.o.   MRN: [id number]  HPI The patient is here for follow up of their chronic medical problems, including htn, DM, anxiety, depression, hyperlipidemia and memory difficulties.   The left knee is stiff since since after surgery.  She is following with ortho.    She is not exercising regularly.   She is eating well.    She has seen Dr Delice Lesch.  She has not seen Dr Melvyn Novas.  She had to cancel due to surgery.  Her memory is worse.   She does get lost while driving.  No difficulty doing anything at home.  She is no longer working.  She went to the ED she was having pre-syncope.  It would last 5 min.  She started seeing a blue color, palpitations, pins and needles all over. Worked from head to toe.  No cause was found.  She had a few episodes of this, but has not had it since then.  She wakes up every morning depressed.  once she gets up and moves she is ok.  Related to home renovation.  She does not feel depressed during the day.  Medications and allergies reviewed with patient and updated if appropriate.  Patient Active Problem List   Diagnosis Date Noted  . History of CVA (cerebrovascular accident) 12/06/2019  . Status post left knee surgery 08/15/2019  . Thumb pain 06/01/2019  . Ear symptom 03/01/2019  . Failed total knee arthroplasty ([place]) 07/14/2018  . Failed total left knee replacement ([place]) 07/14/2018  . Anxiety 11/20/2017  . Glaucoma 07/14/2017  . OA (osteoarthritis) of hip 05/20/2017  . Osteoarthritis of left hip 05/10/2017  . Memory difficulties 04/13/2015  . Type 2 diabetes mellitus without complication, without long-term current use of insulin ([place]) 04/13/2015  . Obese 03/21/2015  . 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 Osteoarthritis of left knee   . Concentration deficit   . 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 Ovarian cyst 07/21/2011  . Mild depression ([place]) 10/19/2008  .  HYPERCHOLESTEROLEMIA 04/03/2008  . Essential hypertension 04/03/2008  . Myalgia 04/03/2008    Current Outpatient Medications on File Prior to Visit  Medication Sig Dispense Refill  . amLODipine (NORVASC) 5 MG tablet TAKE 1 TABLET(5 MG) BY MOUTH DAILY 90 tablet 0  . atorvastatin (LIPITOR) 40 MG tablet Take 1 tablet (40 mg total) by mouth daily. 90 tablet 1  . citalopram (CELEXA) 40 MG tablet TAKE 1 TABLET BY MOUTH DAILY 90 tablet 0  . losartan (COZAAR) 50 MG tablet Take 1 tablet (50 mg total) by mouth daily. 90 tablet 3  . metFORMIN (GLUCOPHAGE) 500 MG tablet TAKE 1 TABLET BY MOUTH TWICE DAILY WITH A MEAL 180 tablet 0  . blood glucose meter kit and supplies KIT Dispense based on patient and insurance preference. Use up to four times daily as directed. (FOR E11.9). (Patient not taking: Reported on 12/07/2019) 1 each 0   No current facility-administered medications on file prior to visit.    Past Medical History:  Diagnosis Date  . Allergy   . CIN I (cervical intraepithelial neoplasia I) 1996   cryo...  cone of cervix  . DEPRESSION   . Diabetes type 2, controlled ([place])   . Dysrhythmia   . Endometrial polyp 07/21/2011  . Glaucoma   . Heart murmur    age 46   . HYPERCHOLESTEROLEMIA   . HYPERTENSION   .  Osteoarthritis    hips and knees  . Palpitations 08/05/2019  . Pneumonia 06/2003  . Postsurgical menopause     Past Surgical History:  Procedure Laterality Date  . ABDOMINAL HYSTERECTOMY  08/22/2011  . CERVICAL CONE BIOPSY  1996  . CESAREAN SECTION     x1  . COLONOSCOPY W/ POLYPECTOMY  12/2008   x3  . DILATION AND CURETTAGE OF UTERUS    . KNEE ARTHROTOMY Left 08/15/2019   Procedure: Left knee arthrotomy; scar excision;  Surgeon: Gaynelle Arabian, MD;  Location: [facility];  Service: Orthopedics;  Laterality: Left;  70mn  . TONSILLECTOMY    . TOTAL HIP ARTHROPLASTY Right 2008  . TOTAL HIP ARTHROPLASTY Left 05/20/2017   Procedure: LEFT TOTAL HIP ARTHROPLASTY ANTERIOR APPROACH;  Surgeon:  AGaynelle Arabian MD;  Location: [facility];  Service: Orthopedics;  Laterality: Left;  . TOTAL KNEE ARTHROPLASTY Left 02/14/2014   DR WDurward Fortes . TOTAL KNEE ARTHROPLASTY Left 02/14/2014   Procedure: TOTAL KNEE ARTHROPLASTY;  Surgeon: PGarald Balding MD;  [facility]  Service: Orthopedics;  Laterality: Left;  . TOTAL KNEE REVISION Left 07/14/2018   Procedure: TOTAL KNEE REVISION;  Surgeon: AGaynelle Arabian MD;  Location: [facility];  Service: Orthopedics;  Laterality: Left;  Adductor Block    Social History   Socioeconomic History  . Marital status: Married    Spouse name: Not on file  . Number of children: 2  . Years of education: Not on file  . Highest education level: Not on file  Occupational History  . 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   Comment: works from home  Tobacco Use  . Smoking status: Former Smoker    Packs/day: 0.25    Years: 1.00    Pack years: 0.25    Types: Cigarettes    Quit date: 07/08/2010    Years since quitting: 9.4  . Smokeless tobacco: Never Used  Vaping Use  . Vaping Use: Never used  Substance and Sexual Activity  . Alcohol use: Yes    Alcohol/week: 0.0 standard drinks    Comment: Rare  . Drug use: No  . Sexual activity: Not Currently    Comment: 1st intercourse- 14, partners- 129 married- 396yrs   Other Topics Concern  . Not on file  Social History Narrative   Lives with spouse; grown kids   Pt is also a sShip broker   Regular exercise-yes   Social Determinants of Health   Financial Resource Strain:   . Difficulty of Paying Living Expenses:   Food Insecurity:   . Worried About RCharity fundraiserin the Last Year:   . RArboriculturistin the Last Year:   Transportation Needs:   . LFilm/video editor(Medical):   .Marland KitchenLack of Transportation (Non-Medical):   Physical Activity:   . Days of Exercise per Week:   . Minutes of Exercise per Session:   Stress:   . Feeling of Stress :   Social Connections:   . Frequency of  Communication with Friends and Family:   . Frequency of Social Gatherings with Friends and Family:   . Attends Religious Services:   . Active Member of Clubs or Organizations:   . Attends CArchivistMeetings:   .Marland KitchenMarital Status:     Family History  Problem Relation Age of Onset  . Pancreatic cancer Mother 320 . Hypertension Father   . Heart disease Father   . Diabetes Son   .  Colon cancer Neg Hx   . Esophageal cancer Neg Hx   . Stomach cancer Neg Hx   . Rectal cancer Neg Hx     Review of Systems  Constitutional: Negative for fever.  Respiratory: Positive for cough (from pollen). Negative for shortness of breath and wheezing.   Cardiovascular: Negative for chest pain, palpitations and leg swelling.  Neurological: Negative for light-headedness (occ) and headaches.  Psychiatric/Behavioral: Positive for dysphoric mood. The patient is not nervous/anxious.        Objective:   Vitals:   12/07/19 1054  BP: 118/78  Pulse: 78  Temp: 98 F (36.7 C)  SpO2: 97%   BP Readings from Last 3 Encounters:  12/07/19 118/78  08/16/19 134/70  08/08/19 (!) 166/82   Wt Readings from Last 3 Encounters:  12/07/19 207 lb (93.9 kg)  08/15/19 206 lb (93.4 kg)  08/08/19 208 lb (94.3 kg)   Body mass index is 35.53 kg/m.   Physical Exam    Constitutional: Appears well-developed and well-nourished. No distress.  HENT:  Head: Normocephalic and atraumatic.  Neck: Neck supple. No tracheal deviation present. No thyromegaly present.  No cervical lymphadenopathy Cardiovascular: Normal rate, regular rhythm and normal heart sounds.   No murmur heard. No carotid bruit .  No edema Pulmonary/Chest: Effort normal and breath sounds normal. No respiratory distress. No has no wheezes. No rales.  Skin: Skin is warm and dry. Not diaphoretic.  Psychiatric: Normal mood and affect. Behavior is normal.      Assessment & Plan:    See Problem List for Assessment and Plan of chronic medical  problems.    This visit occurred during the SARS-CoV-2 public health emergency.  Safety protocols were in place, including screening questions prior to the visit, additional usage of staff PPE, and extensive cleaning of exam room while observing appropriate contact time as indicated for disinfecting solutions.

## 2019-12-06 NOTE — Patient Instructions (Addendum)
Schedule with Dr Melvyn Novas at [name] -  Address: [address] [place], [place] [address] Phone: [phone]   Blood work was ordered.     Medications reviewed and updated.  Changes include :   none    A referral was ordered for Pulmonary ( lung) for evaluation of sleep apnea.  They will call you to schedule.    Please followup in 6 months

## 2019-12-07 ENCOUNTER — Encounter: Payer: Self-pay | Admitting: Internal Medicine

## 2019-12-07 ENCOUNTER — Ambulatory Visit (INDEPENDENT_AMBULATORY_CARE_PROVIDER_SITE_OTHER): Payer: BC Managed Care – PPO | Admitting: Internal Medicine

## 2019-12-07 ENCOUNTER — Other Ambulatory Visit: Payer: Self-pay

## 2019-12-07 VITALS — BP 118/78 | HR 78 | Temp 98.0°F | Ht 64.0 in | Wt 207.0 lb

## 2019-12-07 DIAGNOSIS — Z8673 Personal history of transient ischemic attack (TIA), and cerebral infarction without residual deficits: Secondary | ICD-10-CM

## 2019-12-07 DIAGNOSIS — E119 Type 2 diabetes mellitus without complications: Secondary | ICD-10-CM | POA: Diagnosis not present

## 2019-12-07 DIAGNOSIS — F419 Anxiety disorder, unspecified: Secondary | ICD-10-CM

## 2019-12-07 DIAGNOSIS — I10 Essential (primary) hypertension: Secondary | ICD-10-CM

## 2019-12-07 DIAGNOSIS — R0683 Snoring: Secondary | ICD-10-CM

## 2019-12-07 DIAGNOSIS — R413 Other amnesia: Secondary | ICD-10-CM

## 2019-12-07 DIAGNOSIS — F32A Depression, unspecified: Secondary | ICD-10-CM

## 2019-12-07 DIAGNOSIS — E7800 Pure hypercholesterolemia, unspecified: Secondary | ICD-10-CM

## 2019-12-07 DIAGNOSIS — F320 Major depressive disorder, single episode, mild: Secondary | ICD-10-CM | POA: Diagnosis not present

## 2019-12-07 HISTORY — DX: Snoring: R06.83

## 2019-12-07 MED ORDER — BLOOD GLUCOSE MONITOR KIT: PACK | 0 refills | Status: DC

## 2019-12-07 NOTE — Assessment & Plan Note (Signed)
Does have some snoring and it sounds like her husband has told her that she stops breathing High risk for sleep apnea Referred to pulmonary for further evaluation Discussed the importance of evaluation especially given her memory loss

## 2019-12-07 NOTE — Assessment & Plan Note (Signed)
Chronic Check A1c Continue Metformin Encouraged regular exercise, diabetic diet Follow-up in 6 months

## 2019-12-07 NOTE — Assessment & Plan Note (Signed)
Chronic Check lipid panel  Continue daily statin Regular exercise and healthy diet encouraged  

## 2019-12-07 NOTE — Assessment & Plan Note (Signed)
Chronic Controlled, stable Continue current dose of medication Celexa 40 mg daily

## 2019-12-07 NOTE — Assessment & Plan Note (Signed)
Chronic Seen on MRI LDL goal less than 70 Check lipid panel Atorvastatin was increased to 40 mg daily

## 2019-12-07 NOTE — Assessment & Plan Note (Signed)
She states her memory has gotten worse She has had some difficulty with getting lost while driving She has seen Dr. Delice Lesch and was referred to Dr. Ilda Mori, but had to cancel that appointment due to surgery-stressed rescheduling appointment with Dr. Ilda Mori for further evaluation-number given

## 2019-12-07 NOTE — Assessment & Plan Note (Signed)
Chronic BP well controlled Current regimen effective and well tolerated Continue current medications at current doses cmp  

## 2019-12-07 NOTE — Assessment & Plan Note (Signed)
Chronic She states she has had some mild depression when she wakes up in the morning but feels that is related to home renovation and when she gets up and gets moving it goes away Overall seems to be controlled Continue Celexa 40 mg daily

## 2019-12-09 ENCOUNTER — Other Ambulatory Visit (INDEPENDENT_AMBULATORY_CARE_PROVIDER_SITE_OTHER): Payer: BC Managed Care – PPO

## 2019-12-09 DIAGNOSIS — E119 Type 2 diabetes mellitus without complications: Secondary | ICD-10-CM | POA: Diagnosis not present

## 2019-12-09 DIAGNOSIS — I10 Essential (primary) hypertension: Secondary | ICD-10-CM

## 2019-12-09 DIAGNOSIS — E7800 Pure hypercholesterolemia, unspecified: Secondary | ICD-10-CM

## 2019-12-09 LAB — LIPID PANEL
Cholesterol: 198 mg/dL (ref 0–200)
HDL: 44.7 mg/dL (ref >39.00)
LDL Cholesterol: 134 mg/dL — ABNORMAL HIGH (ref 0–99)
NonHDL: 153.46
Total CHOL/HDL Ratio: 4
Triglycerides: 96 mg/dL (ref 0.0–149.0)
VLDL: 19.2 mg/dL (ref 0.0–40.0)

## 2019-12-09 LAB — COMPREHENSIVE METABOLIC PANEL
ALT: 10 U/L (ref 0–35)
AST: 13 U/L (ref 0–37)
Albumin: 4.6 g/dL (ref 3.5–5.2)
Alkaline Phosphatase: 53 U/L (ref 39–117)
BUN: 15 mg/dL (ref 6–23)
CO2: 30 mEq/L (ref 19–32)
Calcium: 9.5 mg/dL (ref 8.4–10.5)
Chloride: 103 mEq/L (ref 96–112)
Creatinine, Ser: 0.65 mg/dL (ref 0.40–1.20)
GFR: 92.41 mL/min (ref >60.00)
Glucose, Bld: 113 mg/dL — ABNORMAL HIGH (ref 70–99)
Potassium: 4.2 mEq/L (ref 3.5–5.1)
Sodium: 143 mEq/L (ref 135–145)
Total Bilirubin: 0.7 mg/dL (ref 0.2–1.2)
Total Protein: 6.8 g/dL (ref 6.0–8.3)

## 2019-12-09 LAB — HEMOGLOBIN A1C: Hgb A1c MFr Bld: 6.1 % (ref 4.6–6.5)

## 2020-02-01 ENCOUNTER — Encounter: Payer: Self-pay | Admitting: Pulmonary Disease

## 2020-02-01 ENCOUNTER — Ambulatory Visit (INDEPENDENT_AMBULATORY_CARE_PROVIDER_SITE_OTHER): Payer: BC Managed Care – PPO | Admitting: Pulmonary Disease

## 2020-02-01 ENCOUNTER — Other Ambulatory Visit: Payer: Self-pay

## 2020-02-01 VITALS — BP 120/80 | HR 80 | Temp 97.6°F | Ht 64.0 in | Wt 213.0 lb

## 2020-02-01 DIAGNOSIS — G4733 Obstructive sleep apnea (adult) (pediatric): Secondary | ICD-10-CM

## 2020-02-01 NOTE — Progress Notes (Signed)
Katherine Lawrence    [id number]    1955/12/28  Primary Care Physician:Burns, Claudina Lick, MD  Referring Physician: Binnie Rail, MD [place],  [place] [address]  Chief complaint:   Witnessed apneas, snoring  HPI:  Patient has been told about apneas, snoring Spouse has recorded with abnormal breathing at night She is not concerned about sleep She states that she wakes up most days feeling like she is at a good nights rest Not really sleepy during the day Occasionally will take a nap during the day for about an hour to an hour and a half  No family history of obstructive sleep apnea Usually goes to bed between 10 and 11 PM Takes about 30 minutes to fall asleep  About 1 trip to the bathroom at night  Final wake up time about 9 AM  Weight is down about 20 pounds recently  She does have a history of diabetes, hypertension, hypercholesterolemia   Smoking history: Reformed smoker  Outpatient Encounter Medications as of 02/01/2020  Medication Sig  . amLODipine (NORVASC) 5 MG tablet TAKE 1 TABLET(5 MG) BY MOUTH DAILY  . atorvastatin (LIPITOR) 40 MG tablet Take 1 tablet (40 mg total) by mouth daily.  . blood glucose meter kit and supplies KIT Dispense based on patient and insurance preference. Use up to four times daily as directed. (FOR E11.9).  . citalopram (CELEXA) 40 MG tablet TAKE 1 TABLET BY MOUTH DAILY  . losartan (COZAAR) 50 MG tablet Take 1 tablet (50 mg total) by mouth daily.  . metFORMIN (GLUCOPHAGE) 500 MG tablet TAKE 1 TABLET BY MOUTH TWICE DAILY WITH A MEAL   No facility-administered encounter medications on file as of 02/01/2020.    Allergies as of 02/01/2020 - Review Complete 12/07/2019  Allergen Reaction Noted  . Pollen extract  10/14/1973  . Bee pollen Swelling and Other (See Comments) 10/14/1973    Past Medical History:  Diagnosis Date  . Allergy   . CIN I (cervical intraepithelial neoplasia I) 1996   cryo...  cone of cervix  .  DEPRESSION   . Diabetes type 2, controlled ([place])   . Dysrhythmia   . Endometrial polyp 07/21/2011  . Glaucoma   . Heart murmur    age 64   . HYPERCHOLESTEROLEMIA   . HYPERTENSION   . Osteoarthritis    hips and knees  . Palpitations 08/05/2019  . Pneumonia 06/2003  . Postsurgical menopause     Past Surgical History:  Procedure Laterality Date  . ABDOMINAL HYSTERECTOMY  08/22/2011  . CERVICAL CONE BIOPSY  1996  . CESAREAN SECTION     x1  . COLONOSCOPY W/ POLYPECTOMY  12/2008   x3  . DILATION AND CURETTAGE OF UTERUS    . KNEE ARTHROTOMY Left 08/15/2019   Procedure: Left knee arthrotomy; scar excision;  Surgeon: Gaynelle Arabian, MD;  Location: [facility];  Service: Orthopedics;  Laterality: Left;  70min  . TONSILLECTOMY    . TOTAL HIP ARTHROPLASTY Right 2008  . TOTAL HIP ARTHROPLASTY Left 05/20/2017   Procedure: LEFT TOTAL HIP ARTHROPLASTY ANTERIOR APPROACH;  Surgeon: Gaynelle Arabian, MD;  Location: [facility];  Service: Orthopedics;  Laterality: Left;  . TOTAL KNEE ARTHROPLASTY Left 02/14/2014   DR Durward Fortes  . TOTAL KNEE ARTHROPLASTY Left 02/14/2014   Procedure: TOTAL KNEE ARTHROPLASTY;  Surgeon: Garald Balding, MD;  Location: [place];  Service: Orthopedics;  Laterality: Left;  . TOTAL KNEE REVISION Left 07/14/2018  Procedure: TOTAL KNEE REVISION;  Surgeon: Ollen Gross, MD;  Location: [facility];  Service: Orthopedics;  Laterality: Left;  Adductor Block    Family History  Problem Relation Age of Onset  . Pancreatic cancer Mother 59  . Hypertension Father   . Heart disease Father   . Diabetes Son   . Colon cancer Neg Hx   . Esophageal cancer Neg Hx   . Stomach cancer Neg Hx   . Rectal cancer Neg Hx     Social History   Socioeconomic History  . Marital status: Married    Spouse name: Not on file  . Number of children: 2  . Years of education: Not on file  . Highest education level: Not on file  Occupational History  . 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 Smoking status: Former Smoker    Packs/day: 0.25    Years: 1.00    Pack years: 0.25    Types: Cigarettes    Quit date: 07/08/2010    Years since quitting: 9.5  . Smokeless tobacco: Never Used  Vaping Use  . Vaping Use: Never used  Substance and Sexual Activity  . Alcohol use: Yes    Alcohol/week: 0.0 standard drinks    Comment: Rare  . Drug use: No  . Sexual activity: Not Currently    Comment: 1st intercourse- 14, partners- 10, married- 35 yrs   Other Topics Concern  . Not on file  Social History Narrative   Lives with spouse; grown kids   Pt is also a Consulting civil engineer.   Regular exercise-yes   Social Determinants of Health   Financial Resource Strain:   . Difficulty of Paying Living Expenses:   Food Insecurity:   . Worried About Programme researcher, broadcasting/film/video in the Last Year:   . Barista in the Last Year:   Transportation Needs:   . Freight forwarder (Medical):   Marland Kitchen Lack of Transportation (Non-Medical):   Physical Activity:   . Days of Exercise per Week:   . Minutes of Exercise per Session:   Stress:   . Feeling of Stress :   Social Connections:   . Frequency of Communication with Friends and Family:   . Frequency of Social Gatherings with Friends and Family:   . Attends Religious Services:   . Active Member of Clubs or Organizations:   . Attends Banker Meetings:   Marland Kitchen Marital Status:   Intimate Partner Violence:   . Fear of Current or Ex-Partner:   . Emotionally Abused:   Marland Kitchen Physically Abused:   . Sexually Abused:     Review of Systems  Constitutional: Negative for activity change and fatigue.  Respiratory: Negative for apnea and shortness of breath.   Cardiovascular: Negative for chest pain.  Gastrointestinal: Negative.  Negative for abdominal distention.  Genitourinary: Negative.   Musculoskeletal: Negative.     Vitals:   02/01/20 0908  BP: 120/80  Pulse: 80  Temp: 97.6 F (36.4 C)  SpO2: 97%      Physical Exam Constitutional:      Appearance: She is obese.  HENT:     Head: Normocephalic.     Nose: No congestion.     Mouth/Throat:     Mouth: Mucous membranes are moist.  Eyes:     Pupils: Pupils are equal, round, and reactive to light.  Cardiovascular:     Rate and Rhythm:  Normal rate and regular rhythm.     Pulses: Normal pulses.     Heart sounds: No murmur heard.   Pulmonary:     Effort: Pulmonary effort is normal. No respiratory distress.     Breath sounds: Normal breath sounds. No stridor. No wheezing or rhonchi.  Musculoskeletal:     Cervical back: No rigidity or tenderness.  Skin:    General: Skin is warm.  Neurological:     General: No focal deficit present.     Mental Status: She is alert.  Psychiatric:        Mood and Affect: Mood normal.    Results of the Epworth flowsheet 02/01/2020  Sitting and reading 1  Watching TV 2  Sitting, inactive in a public place (e.g. a theatre or a meeting) 0  As a passenger in a car for an hour without a break 0  Lying down to rest in the afternoon when circumstances permit 1  Sitting and talking to someone 0  Sitting quietly after a lunch without alcohol 0  In a car, while stopped for a few minutes in traffic 0  Total score 4    Data Reviewed: Records reviewed  Assessment:  Moderate probability of significant obstructive sleep apnea -Witnessed apneas -Snoring  Obesity -She continues to work on weight loss efforts  Diabetes Hypertension Hypercholesterolemia  Pathophysiology of sleep disordered breathing discussed with the patient  Plan/Recommendations: We will schedule patient for home sleep study  Treatment options discussed  Risk of not treating sleep disordered breathing discussed with the patient  We will follow-up in 3 months Encouraged to call with any significant concerns  Sherrilyn Rist MD [facility] 02/01/2020, 9:15 AM  CC: Binnie Rail, MD

## 2020-02-01 NOTE — Patient Instructions (Signed)
Moderate probability of significant obstructive sleep apnea  We will schedule you for home sleep study We will apprise you of results  Treatment options as we discussed  Follow-up tentatively in about 3 months  Call with significant concerns Sleep Apnea Sleep apnea affects breathing during sleep. It causes breathing to stop for a short time or to become shallow. It can also increase the risk of:  Heart attack.  Stroke.  Being very overweight (obese).  Diabetes.  Heart failure.  Irregular heartbeat. The goal of treatment is to help you breathe normally again. What are the causes? There are three kinds of sleep apnea:  Obstructive sleep apnea. This is caused by a blocked or collapsed airway.  Central sleep apnea. This happens when the brain does not send the right signals to the muscles that control breathing.  Mixed sleep apnea. This is a combination of obstructive and central sleep apnea. The most common cause of this condition is a collapsed or blocked airway. This can happen if:  Your throat muscles are too relaxed.  Your tongue and tonsils are too large.  You are overweight.  Your airway is too small. What increases the risk?  Being overweight.  Smoking.  Having a small airway.  Being older.  Being female.  Drinking alcohol.  Taking medicines to calm yourself (sedatives or tranquilizers).  Having family members with the condition. What are the signs or symptoms?  Trouble staying asleep.  Being sleepy or tired during the day.  Getting angry a lot.  Loud snoring.  Headaches in the morning.  Not being able to focus your mind (concentrate).  Forgetting things.  Less interest in sex.  Mood swings.  Personality changes.  Feelings of sadness (depression).  Waking up a lot during the night to pee (urinate).  Dry mouth.  Sore throat. How is this diagnosed?  Your medical history.  A physical exam.  A test that is done when you are  sleeping (sleep study). The test is most often done in a sleep lab but may also be done at home. How is this treated?   Sleeping on your side.  Using a medicine to get rid of mucus in your nose (decongestant).  Avoiding the use of alcohol, medicines to help you relax, or certain pain medicines (narcotics).  Losing weight, if needed.  Changing your diet.  Not smoking.  Using a machine to open your airway while you sleep, such as: ? An oral appliance. This is a mouthpiece that shifts your lower jaw forward. ? A CPAP device. This device blows air through a mask when you breathe out (exhale). ? An EPAP device. This has valves that you put in each nostril. ? A BPAP device. This device blows air through a mask when you breathe in (inhale) and breathe out.  Having surgery if other treatments do not work. It is important to get treatment for sleep apnea. Without treatment, it can lead to:  High blood pressure.  Coronary artery disease.  In men, not being able to have an erection (impotence).  Reduced thinking ability. Follow these instructions at home: Lifestyle  Make changes that your doctor recommends.  Eat a healthy diet.  Lose weight if needed.  Avoid alcohol, medicines to help you relax, and some pain medicines.  Do not use any products that contain nicotine or tobacco, such as cigarettes, e-cigarettes, and chewing tobacco. If you need help quitting, ask your doctor. General instructions  Take over-the-counter and prescription medicines only as told  by your doctor.  If you were given a machine to use while you sleep, use it only as told by your doctor.  If you are having surgery, make sure to tell your doctor you have sleep apnea. You may need to bring your device with you.  Keep all follow-up visits as told by your doctor. This is important. Contact a doctor if:  The machine that you were given to use during sleep bothers you or does not seem to be working.  You  do not get better.  You get worse. Get help right away if:  Your chest hurts.  You have trouble breathing in enough air.  You have an uncomfortable feeling in your back, arms, or stomach.  You have trouble talking.  One side of your body feels weak.  A part of your face is hanging down. These symptoms may be an emergency. Do not wait to see if the symptoms will go away. Get medical help right away. Call your local emergency services (911 in the [place] Do not drive yourself to the hospital. Summary  This condition affects breathing during sleep.  The most common cause is a collapsed or blocked airway.  The goal of treatment is to help you breathe normally while you sleep. This information is not intended to replace advice given to you by your health care provider. Make sure you discuss any questions you have with your health care provider. Document Revised: 03/19/2018 Document Reviewed: 01/26/2018 Elsevier Patient Education  [place].

## 2020-02-03 ENCOUNTER — Encounter: Payer: Self-pay | Admitting: Internal Medicine

## 2020-02-03 MED ORDER — LOSARTAN POTASSIUM 25 MG PO TABS: 25.0000 mg | ORAL_TABLET | Freq: Every day | ORAL | 1 refills | Status: DC

## 2020-02-24 DIAGNOSIS — Z471 Aftercare following joint replacement surgery: Secondary | ICD-10-CM | POA: Diagnosis not present

## 2020-02-24 DIAGNOSIS — Z96652 Presence of left artificial knee joint: Secondary | ICD-10-CM | POA: Diagnosis not present

## 2020-02-26 ENCOUNTER — Other Ambulatory Visit: Payer: Self-pay | Admitting: Internal Medicine

## 2020-02-29 ENCOUNTER — Telehealth: Payer: Self-pay | Admitting: Pulmonary Disease

## 2020-02-29 NOTE — Telephone Encounter (Signed)
Dr. Ander Slade just an Katherine Lawrence pt to scheduled HST she stated we are out of network for her insurance and the amount she would owe is extremly high and did not want to proceed at this time

## 2020-03-01 NOTE — Telephone Encounter (Signed)
Tried calling pt and there was no answer- LMTCB.  

## 2020-03-01 NOTE — Telephone Encounter (Signed)
Aware  Recommend that she still proceed with sleep testing through a facility that will be in her network

## 2020-03-02 ENCOUNTER — Telehealth: Payer: Self-pay | Admitting: Pulmonary Disease

## 2020-03-02 NOTE — Telephone Encounter (Signed)
Spoke with patient regarding prior message. Patient will not have insurance in October her husband is going onto [employer]. A this time patient is looking for insurance.   FYI Dr.Olalere

## 2020-03-06 NOTE — Telephone Encounter (Signed)
Aware. 

## 2020-04-05 DIAGNOSIS — Z1231 Encounter for screening mammogram for malignant neoplasm of breast: Secondary | ICD-10-CM | POA: Diagnosis not present

## 2020-04-12 NOTE — Progress Notes (Signed)
Subjective:    Patient ID: Katherine Lawrence, female    DOB: 12/12/1955, 64 y.o.   MRN: [id number]  HPI The patient is here for an acute visit.   Pain in arms - she has had this for a while ( 6 months) , but it got worse this mid week.  Her left shoulder cracks and comes down her arm and it is starting to happen in her right arm.  She has pain across her neck.  Pain is worse when sleeping.    She denies N/T.  No hand weakness. She has dec ROM of her shoulders and arms feel weak.  Increased pain with neck.  She feels muscle tightness.    She takes advil and that helps.  Heat helps but it is temporary.    Medications and allergies reviewed with patient and updated if appropriate.  Patient Active Problem List   Diagnosis Date Noted  . Snoring 12/07/2019  . History of CVA (cerebrovascular accident) 12/06/2019  . Status post left knee surgery 08/15/2019  . Thumb pain 06/01/2019  . Ear symptom 03/01/2019  . 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 Anxiety 11/20/2017  . Glaucoma 07/14/2017  . OA (osteoarthritis) of hip 05/20/2017  . Osteoarthritis of left hip 05/10/2017  . Memory difficulties 04/13/2015  . Type 2 diabetes mellitus without complication, without long-term current use of insulin ([place]) 04/13/2015  . Obese 03/21/2015  . 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 Osteoarthritis of left knee   . Concentration deficit   . 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 Ovarian cyst 07/21/2011  . Mild depression ([place]) 10/19/2008  . HYPERCHOLESTEROLEMIA 04/03/2008  . Essential hypertension 04/03/2008  . Myalgia 04/03/2008    Current Outpatient Medications on File Prior to Visit  Medication Sig Dispense Refill  . ACCU-CHEK GUIDE test strip USE UP TO FOUR TIMES DAILY    . Accu-Chek Softclix Lancets lancets SMARTSIG:Topical 1 to 4 Times Daily    . amLODipine (NORVASC) 5 MG tablet TAKE 1 TABLET(5 MG) BY MOUTH DAILY  90 tablet 0  . atorvastatin (LIPITOR) 40 MG tablet Take 1 tablet (40 mg total) by mouth daily. 90 tablet 1  . blood glucose meter kit and supplies KIT Dispense based on patient and insurance preference. Use up to four times daily as directed. (FOR E11.9). 1 each 0  . citalopram (CELEXA) 40 MG tablet TAKE 1 TABLET BY MOUTH DAILY 90 tablet 0  . losartan (COZAAR) 25 MG tablet Take 1 tablet (25 mg total) by mouth daily. 90 tablet 1  . metFORMIN (GLUCOPHAGE) 500 MG tablet TAKE 1 TABLET BY MOUTH TWICE DAILY WITH A MEAL 180 tablet 0   No current facility-administered medications on file prior to visit.    Past Medical History:  Diagnosis Date  . Allergy   . CIN I (cervical intraepithelial neoplasia I) 1996   cryo...  cone of cervix  . DEPRESSION   . Diabetes type 2, controlled ([place])   . Dysrhythmia   . Endometrial polyp 07/21/2011  . Glaucoma   . Heart murmur    age 59   . HYPERCHOLESTEROLEMIA   . HYPERTENSION   . Osteoarthritis    hips and knees  . Palpitations 08/05/2019  . Pneumonia 06/2003  . Postsurgical menopause     Past Surgical History:  Procedure Laterality Date  . ABDOMINAL HYSTERECTOMY  08/22/2011  . CERVICAL CONE BIOPSY  1996  .  CESAREAN SECTION     x1  . COLONOSCOPY W/ POLYPECTOMY  12/2008   x3  . DILATION AND CURETTAGE OF UTERUS    . KNEE ARTHROTOMY Left 08/15/2019   Procedure: Left knee arthrotomy; scar excision;  Surgeon: Ollen Gross, MD;  Location: [facility];  Service: Orthopedics;  Laterality: Left;   . TONSILLECTOMY    . TOTAL HIP ARTHROPLASTY Right 2008  . TOTAL HIP ARTHROPLASTY Left 05/20/2017   Procedure: LEFT TOTAL HIP ARTHROPLASTY ANTERIOR APPROACH;  Surgeon: Ollen Gross, MD;  Location: [facility];  Service: Orthopedics;  Laterality: Left;  . TOTAL KNEE ARTHROPLASTY Left 02/14/2014   DR Cleophas Dunker  . TOTAL KNEE ARTHROPLASTY Left 02/14/2014   Procedure: TOTAL KNEE ARTHROPLASTY;  Surgeon: Valeria Batman, MD;  [facility];  Service: Orthopedics;   Laterality: Left;  . TOTAL KNEE REVISION Left 07/14/2018   Procedure: TOTAL KNEE REVISION;  Surgeon: Ollen Gross, MD;  Location: [facility];  Service: Orthopedics;  Laterality: Left;  Adductor Block    Social History   Socioeconomic History  . Marital status: Married    Spouse name: Not on file  . Number of children: 2  . Years of education: Not on file  . Highest education level: Not on file  Occupational History  . 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 Smoking status: Former Smoker    Packs/day: 0.25    Years: 1.00    Pack years: 0.25    Types: Cigarettes    Quit date: 07/08/2010    Years since quitting: 9.7  . Smokeless tobacco: Never Used  Vaping Use  . Vaping Use: Never used  Substance and Sexual Activity  . Alcohol use: Yes    Alcohol/week: 0.0 standard drinks    Comment: Rare  . Drug use: No  . Sexual activity: Not Currently    Comment: 1st intercourse- 14, partners- 10, married- 35 yrs   Other Topics Concern  . Not on file  Social History Narrative   Lives with spouse; grown kids   Pt is also a Consulting civil engineer.   Regular exercise-yes   Social Determinants of Health   Financial Resource Strain:   . Difficulty of Paying Living Expenses: Not on file  Food Insecurity:   . Worried About Programme researcher, broadcasting/film/video in the Last Year: Not on file  . Ran Out of Food in the Last Year: Not on file  Transportation Needs:   . Lack of Transportation (Medical): Not on file  . Lack of Transportation (Non-Medical): Not on file  Physical Activity:   . Days of Exercise per Week: Not on file  . Minutes of Exercise per Session: Not on file  Stress:   . Feeling of Stress : Not on file  Social Connections:   . Frequency of Communication with Friends and Family: Not on file  . Frequency of Social Gatherings with Friends and Family: Not on file  . Attends Religious Services: Not on file  . Active Member of Clubs or Organizations:  Not on file  . Attends Banker Meetings: Not on file  . Marital Status: Not on file    Family History  Problem Relation Age of Onset  . Pancreatic cancer Mother 43  . Hypertension Father   . Heart disease Father   . Diabetes Son   . Colon cancer Neg Hx   . Esophageal cancer Neg Hx   . Stomach  cancer Neg Hx   . Rectal cancer Neg Hx     Review of Systems  Constitutional: Negative for fever.  Musculoskeletal: Positive for arthralgias, back pain (Mid upper back), neck pain and neck stiffness. Negative for myalgias.  Neurological: Positive for weakness (Left hand). Negative for numbness and headaches.       Objective:   Vitals:   04/13/20 1407  BP: 130/84  Pulse: 70  Temp: 98.6 F (37 C)  SpO2: 97%   BP Readings from Last 3 Encounters:  04/13/20 130/84  02/01/20 120/80  12/07/19 118/78   Wt Readings from Last 3 Encounters:  04/13/20 216 lb (98 kg)  02/01/20 213 lb (96.6 kg)  12/07/19 207 lb (93.9 kg)   Body mass index is 37.08 kg/m.   Physical Exam Constitutional:      General: She is not in acute distress.    Appearance: Normal appearance. She is not ill-appearing.  HENT:     Head: Normocephalic and atraumatic.  Musculoskeletal:        General: Tenderness (Upper center thoracic region and bilateral trapezius muscles, posterior neck.  No tenderness along cervical spine) present. No swelling or deformity.     Comments: Full range of motion of neck, but with some discomfort.  Decreased range of motion left shoulder.  Full range of motion right shoulder  Skin:    General: Skin is warm and dry.     Findings: No erythema or rash.  Neurological:     Mental Status: She is alert.     Sensory: No sensory deficit.     Motor: No weakness.            Assessment & Plan:    See Problem List for Assessment and Plan of chronic medical problems.    This visit occurred during the SARS-CoV-2 public health emergency.  Safety protocols were in place,  including screening questions prior to the visit, additional usage of staff PPE, and extensive cleaning of exam room while observing appropriate contact time as indicated for disinfecting solutions.

## 2020-04-13 ENCOUNTER — Ambulatory Visit (INDEPENDENT_AMBULATORY_CARE_PROVIDER_SITE_OTHER): Payer: BC Managed Care – PPO | Admitting: Internal Medicine

## 2020-04-13 ENCOUNTER — Encounter: Payer: Self-pay | Admitting: Internal Medicine

## 2020-04-13 ENCOUNTER — Ambulatory Visit (INDEPENDENT_AMBULATORY_CARE_PROVIDER_SITE_OTHER): Payer: BC Managed Care – PPO

## 2020-04-13 ENCOUNTER — Other Ambulatory Visit: Payer: Self-pay

## 2020-04-13 VITALS — BP 130/84 | HR 70 | Temp 98.6°F | Ht 64.0 in | Wt 216.0 lb

## 2020-04-13 DIAGNOSIS — M542 Cervicalgia: Secondary | ICD-10-CM

## 2020-04-13 DIAGNOSIS — M79601 Pain in right arm: Secondary | ICD-10-CM | POA: Diagnosis not present

## 2020-04-13 DIAGNOSIS — M79602 Pain in left arm: Secondary | ICD-10-CM | POA: Diagnosis not present

## 2020-04-13 DIAGNOSIS — M5134 Other intervertebral disc degeneration, thoracic region: Secondary | ICD-10-CM | POA: Diagnosis not present

## 2020-04-13 DIAGNOSIS — M50322 Other cervical disc degeneration at C5-C6 level: Secondary | ICD-10-CM | POA: Diagnosis not present

## 2020-04-13 HISTORY — DX: Cervicalgia: M54.2

## 2020-04-13 MED ORDER — CYCLOBENZAPRINE HCL 5 MG PO TABS: 5.0000 mg | ORAL_TABLET | Freq: Every day | ORAL | 1 refills | Status: DC

## 2020-04-13 MED ORDER — MELOXICAM 15 MG PO TABS: 15.0000 mg | ORAL_TABLET | Freq: Every day | ORAL | 0 refills | Status: DC

## 2020-04-13 NOTE — Assessment & Plan Note (Signed)
Acute Experiencing neck and upper back-upper thoracic pain Pain radiating down left arm and now started to radiate down right arm Also some decreased range of motion of left shoulder ?  Cervical radiculopathy X-ray of C-spine and T-spine today Meloxicam 15 mg daily with food-no other NSAIDs while taking Flexeril 5-10 mg at bedtime Referred to orthopedics Will hold off on PT until we have more information possible cause

## 2020-04-13 NOTE — Patient Instructions (Addendum)
  Have xray downstairs   Medications reviewed and updated.  Changes include :   Start meloxicam 15 mg daily with food.  Take flexeril at night 5-10 mg - muscle relaxer.   Your prescription(s) have been submitted to your pharmacy. Please take as directed and contact our office if you believe you are having problem(s) with the medication(s).  A referral was ordered for [facility].

## 2020-04-13 NOTE — Assessment & Plan Note (Signed)
Acute Experiencing pain in left arm and now more recently in the right arm No numbness or tingling Some weakness in left hand Concerning for possible cervical radiculopathy although she does have some decreased range of motion of left shoulder We will start with C-spine and T-spine x-rays Meloxicam 15 mg daily, Flexeril 5-10 mg at night Refer to orthopedics for further evaluation We will hold off on PT until we have a diagnosis

## 2020-05-11 ENCOUNTER — Other Ambulatory Visit: Payer: Self-pay | Admitting: Internal Medicine

## 2020-06-05 DIAGNOSIS — M5459 Other low back pain: Secondary | ICD-10-CM | POA: Diagnosis not present

## 2020-06-05 DIAGNOSIS — Z96642 Presence of left artificial hip joint: Secondary | ICD-10-CM | POA: Diagnosis not present

## 2020-06-08 ENCOUNTER — Other Ambulatory Visit: Payer: Self-pay | Admitting: Internal Medicine

## 2020-06-11 ENCOUNTER — Other Ambulatory Visit: Payer: Self-pay | Admitting: Internal Medicine

## 2020-06-19 DIAGNOSIS — M545 Low back pain, unspecified: Secondary | ICD-10-CM | POA: Diagnosis not present

## 2020-06-26 DIAGNOSIS — M5459 Other low back pain: Secondary | ICD-10-CM | POA: Diagnosis not present

## 2020-07-03 ENCOUNTER — Ambulatory Visit: Payer: BC Managed Care – PPO

## 2020-07-05 ENCOUNTER — Ambulatory Visit: Payer: BC Managed Care – PPO | Attending: Internal Medicine

## 2020-07-05 DIAGNOSIS — Z23 Encounter for immunization: Secondary | ICD-10-CM

## 2020-07-05 NOTE — Progress Notes (Signed)
   Covid-19 Vaccination Clinic  Name:  Katherine Lawrence    MRN: [id number] DOB: [birth year]  07/05/2020  Ms. Katherine Lawrence was observed post Covid-19 immunization for 15 minutes without incident. She was provided with Vaccine Information Sheet and instruction to access the V-Safe system.   Ms. Katherine Lawrence was instructed to call 911 with any severe reactions post vaccine: Marland Kitchen Difficulty breathing  . Swelling of face and throat  . A fast heartbeat  . A bad rash all over body  . Dizziness and weakness   Immunizations Administered    Name Date Dose VIS Date Route   Pfizer COVID-19 Vaccine 07/05/2020  1:53 PM 0.3 mL 04/04/2020 Intramuscular   Manufacturer: [place]   Lot: [id number]   NDC: [address]-7

## 2020-08-14 DIAGNOSIS — H40053 Ocular hypertension, bilateral: Secondary | ICD-10-CM | POA: Diagnosis not present

## 2020-08-14 DIAGNOSIS — H11153 Pinguecula, bilateral: Secondary | ICD-10-CM | POA: Diagnosis not present

## 2020-08-14 DIAGNOSIS — H2513 Age-related nuclear cataract, bilateral: Secondary | ICD-10-CM | POA: Diagnosis not present

## 2020-08-14 DIAGNOSIS — E119 Type 2 diabetes mellitus without complications: Secondary | ICD-10-CM | POA: Diagnosis not present

## 2020-08-14 LAB — HM DIABETES EYE EXAM

## 2020-08-16 ENCOUNTER — Other Ambulatory Visit: Payer: Self-pay | Admitting: Internal Medicine

## 2020-08-23 ENCOUNTER — Encounter: Payer: Self-pay | Admitting: Internal Medicine

## 2020-08-23 NOTE — Progress Notes (Signed)
Outside notes received. Information abstracted. Notes sent to scan.  

## 2020-08-31 DIAGNOSIS — M25551 Pain in right hip: Secondary | ICD-10-CM | POA: Diagnosis not present

## 2020-08-31 DIAGNOSIS — Z96641 Presence of right artificial hip joint: Secondary | ICD-10-CM | POA: Diagnosis not present

## 2020-09-05 ENCOUNTER — Other Ambulatory Visit: Payer: Self-pay | Admitting: Internal Medicine

## 2020-12-05 DIAGNOSIS — R7303 Prediabetes: Secondary | ICD-10-CM | POA: Insufficient documentation

## 2020-12-05 NOTE — Patient Instructions (Addendum)
Consider getting a shingles vaccine.    Blood work was ordered.     Medications changes include :   start a supplemental fiber such as citracel or benefiber daily  Your prescription(s) have been submitted to your pharmacy. Please take as directed and contact our office if you believe you are having problem(s) with the medication(s).   A referral was ordered for        Someone from their office will call you to schedule an appointment.    Please followup in 6 months     Health Maintenance, Female Adopting a healthy lifestyle and getting preventive care are important in promoting health and wellness. Ask your health care provider about: The right schedule for you to have regular tests and exams. Things you can do on your own to prevent diseases and keep yourself healthy. What should I know about diet, weight, and exercise? Eat a healthy diet  Eat a diet that includes plenty of vegetables, fruits, low-fat dairy products, and lean protein. Do not eat a lot of foods that are high in solid fats, added sugars, or sodium.  Maintain a healthy weight Body mass index (BMI) is used to identify weight problems. It estimates body fat based on height and weight. Your health care provider can help determineyour BMI and help you achieve or maintain a healthy weight. Get regular exercise Get regular exercise. This is one of the most important things you can do for your health. Most adults should: Exercise for at least 150 minutes each week. The exercise should increase your heart rate and make you sweat (moderate-intensity exercise). Do strengthening exercises at least twice a week. This is in addition to the moderate-intensity exercise. Spend less time sitting. Even light physical activity can be beneficial. Watch cholesterol and blood lipids Have your blood tested for lipids and cholesterol at 63 years of age, then havethis test every 5 years. Have your cholesterol levels checked more often  if: Your lipid or cholesterol levels are high. You are older than 63 years of age. You are at high risk for heart disease. What should I know about cancer screening? Depending on your health history and family history, you may need to have cancer screening at various ages. This may include screening for: Breast cancer. Cervical cancer. Colorectal cancer. Skin cancer. Lung cancer. What should I know about heart disease, diabetes, and high blood pressure? Blood pressure and heart disease High blood pressure causes heart disease and increases the risk of stroke. This is more likely to develop in people who have high blood pressure readings, are of African descent, or are overweight. Have your blood pressure checked: Every 3-5 years if you are 42-22 years of age. Every year if you are 71 years old or older. Diabetes Have regular diabetes screenings. This checks your fasting blood sugar level. Have the screening done: Once every three years after age 32 if you are at a normal weight and have a low risk for diabetes. More often and at a younger age if you are overweight or have a high risk for diabetes. What should I know about preventing infection? Hepatitis B If you have a higher risk for hepatitis B, you should be screened for this virus. Talk with your health care provider to find out if you are at risk forhepatitis B infection. Hepatitis C Testing is recommended for: Everyone born from [birth year] through 1963. Anyone with known risk factors for hepatitis C. Sexually transmitted infections (STIs) Get screened for STIs, including  gonorrhea and chlamydia, if: You are sexually active and are younger than 63 years of age. You are older than 63 years of age and your health care provider tells you that you are at risk for this type of infection. Your sexual activity has changed since you were last screened, and you are at increased risk for chlamydia or gonorrhea. Ask your health care provider if  you are at risk. Ask your health care provider about whether you are at high risk for HIV. Your health care provider may recommend a prescription medicine to help prevent HIV infection. If you choose to take medicine to prevent HIV, you should first get tested for HIV. You should then be tested every 3 months for as long as you are taking the medicine. Pregnancy If you are about to stop having your period (premenopausal) and you may become pregnant, seek counseling before you get pregnant. Take 400 to 800 micrograms (mcg) of folic acid every day if you become pregnant. Ask for birth control (contraception) if you want to prevent pregnancy. Osteoporosis and menopause Osteoporosis is a disease in which the bones lose minerals and strength with aging. This can result in bone fractures. If you are 43 years old or older, or if you are at risk for osteoporosis and fractures, ask your health care provider if you should: Be screened for bone loss. Take a calcium or vitamin D supplement to lower your risk of fractures. Be given hormone replacement therapy (HRT) to treat symptoms of menopause. Follow these instructions at home: Lifestyle Do not use any products that contain nicotine or tobacco, such as cigarettes, e-cigarettes, and chewing tobacco. If you need help quitting, ask your health care provider. Do not use street drugs. Do not share needles. Ask your health care provider for help if you need support or information about quitting drugs. Alcohol use Do not drink alcohol if: Your health care provider tells you not to drink. You are pregnant, may be pregnant, or are planning to become pregnant. If you drink alcohol: Limit how much you use to 0-1 drink a day. Limit intake if you are breastfeeding. Be aware of how much alcohol is in your drink. In the [place] one drink equals one 12 oz bottle of beer (355 mL), one 5 oz glass of wine (148 mL), or one 1 oz glass of hard liquor (44 mL). General  instructions Schedule regular health, dental, and eye exams. Stay current with your vaccines. Tell your health care provider if: You often feel depressed. You have ever been abused or do not feel safe at home. Summary Adopting a healthy lifestyle and getting preventive care are important in promoting health and wellness. Follow your health care provider's instructions about healthy diet, exercising, and getting tested or screened for diseases. Follow your health care provider's instructions on monitoring your cholesterol and blood pressure. This information is not intended to replace advice given to you by your health care provider. Make sure you discuss any questions you have with your healthcare provider. Document Revised: 05/26/2018 Document Reviewed: 05/26/2018 Elsevier Patient Education  2022 [employer].

## 2020-12-05 NOTE — Progress Notes (Signed)
Subjective:    Patient ID: Katherine Lawrence, female    DOB: Feb 20, 1956, 65 y.o.   MRN: [id number]   This visit occurred during the SARS-CoV-2 public health emergency.  Safety protocols were in place, including screening questions prior to the visit, additional usage of staff PPE, and extensive cleaning of exam room while observing appropriate contact time as indicated for disinfecting solutions.    HPI She is here for a physical exam.    Her eyes are red all the time and her pupils are small.  If she uses visine in the morning her eyes are ok.  No watery discharge.  She sees Dr Katy Fitch.  Last eye exam was normal - this is new since then.  No new meds.  No headaches.     She had sudden onset of LLQ pain intermittent for no reason.  She has had a few episodes.  No nausea.  Goes away after a few minutes.  She does state constipation.   Medications and allergies reviewed with patient and updated if appropriate.  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   Anxiety 11/20/2017   Glaucoma 07/14/2017   Osteoarthritis of left hip 05/10/2017   Memory difficulties 04/13/2015   Type 2 diabetes mellitus without complication, without long-term current use of insulin ([place]) 04/13/2015   Obese 03/21/2015   S/P total knee replacement using cement 02/14/2014   Osteoarthritis of left knee    Concentration deficit    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 90 tablet 1   blood glucose meter kit and supplies KIT Dispense based on patient and insurance preference. Use up to four times daily as directed. (FOR E11.9). 1 each 0   citalopram (CELEXA) 40 MG tablet TAKE 1 TABLET BY MOUTH DAILY 90 tablet 0   cyclobenzaprine (FLEXERIL) 5 MG tablet Take 1-2 tablets (5-10 mg total) by mouth at bedtime. 60 tablet 1   losartan (COZAAR) 25 MG tablet TAKE 1 TABLET(25 MG) BY MOUTH DAILY 90 tablet 1   metFORMIN (GLUCOPHAGE) 500 MG tablet TAKE 1 TABLET BY MOUTH TWICE DAILY WITH A MEAL (Patient not taking: Reported on 12/06/2020) 180 tablet 0   No current facility-administered medications on file prior to visit.    Past Medical History:  Diagnosis Date   Allergy    CIN I (cervical intraepithelial neoplasia I) 1996   cryo...  cone of cervix   DEPRESSION    Diabetes type 2, controlled ([place])    Dysrhythmia    Endometrial polyp 07/21/2011   Glaucoma    Heart murmur    age 55  HYPERCHOLESTEROLEMIA    HYPERTENSION    Osteoarthritis    hips and knees   Palpitations 08/05/2019   Pneumonia 06/2003   Postsurgical menopause     Past Surgical History:  Procedure Laterality Date   ABDOMINAL HYSTERECTOMY  08/22/2011   CERVICAL CONE BIOPSY  1996   CESAREAN SECTION     x1   COLONOSCOPY W/ POLYPECTOMY  12/2008   x3   DILATION AND CURETTAGE OF UTERUS     KNEE ARTHROTOMY Left 08/15/2019   Procedure: Left knee arthrotomy; scar excision;  Surgeon: Gaynelle Arabian, MD;  Location: [facility];  Service: Orthopedics;  Laterality: Left;  89mn   TONSILLECTOMY     TOTAL HIP ARTHROPLASTY Right 2008   TOTAL HIP ARTHROPLASTY Left 05/20/2017   Procedure: LEFT TOTAL HIP ARTHROPLASTY ANTERIOR APPROACH;   Surgeon: AGaynelle Arabian MD;  Location: [facility];  Service: Orthopedics;  Laterality: Left;   TOTAL KNEE ARTHROPLASTY Left 02/14/2014   DR WDurward Fortes  TOTAL KNEE ARTHROPLASTY Left 02/14/2014   Procedure: TOTAL KNEE ARTHROPLASTY;  Surgeon: PGarald Balding MD;  [facility]  Service: Orthopedics;  Laterality: Left;   TOTAL KNEE REVISION Left 07/14/2018   Procedure: TOTAL KNEE REVISION;  Surgeon: AGaynelle Arabian MD;  Location: [facility];  Service: Orthopedics;  Laterality: Left;  Adductor Block    Social History   Socioeconomic History   Marital status: Married    Spouse name: Not on file   Number of children: 2   Years of education: Not on file   Highest education level: Not on file  Occupational History   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   Comment: works from home  Tobacco Use   Smoking status: Former    Packs/day: 0.25    Years: 1.00    Pack years: 0.25    Types: Cigarettes    Quit date: 07/08/2010    Years since quitting: 10.4   Smokeless tobacco: Never  Vaping Use   Vaping Use: Never used  Substance and Sexual Activity   Alcohol use: Yes    Alcohol/week: 0.0 standard drinks    Comment: Rare   Drug use: No   Sexual activity: Not Currently    Comment: 1st intercourse- 14, partners- 191 married- 89yrs   Other Topics Concern   Not on file  Social History Narrative   Lives with spouse; grown kids   Pt is also a [employer] broker   Regular exercise-yes   Social Determinants of Health   Financial Resource Strain: Not on file  Food Insecurity: Not on file  Transportation Needs: Not on file  Physical Activity: Not on file  Stress: Not on file  Social Connections: Not on file    Family History  Problem Relation Age of Onset   Pancreatic cancer Mother 374  Hypertension Father    Heart disease Father    Diabetes Son    Colon cancer Neg Hx    Esophageal cancer Neg Hx    Stomach cancer Neg Hx    Rectal cancer Neg Hx     Review of Systems   Constitutional:  Negative for fever.  Eyes:  Positive for redness and visual disturbance (blurry vision - improved with visine). Negative for photophobia.  Respiratory:  Positive for cough (allergy related) and wheezing (allergy related). Negative for shortness of breath.   Cardiovascular:  Positive for chest pain (rare). Negative for palpitations and leg swelling.  Gastrointestinal:  Positive for abdominal pain (intermittent LLQ), constipation (  new) and diarrhea (rare). Negative for blood in stool and nausea.       No gerd  Genitourinary:  Negative for dysuria.  Musculoskeletal:  Positive for arthralgias (knees, shoulder). Negative for back pain.  Skin:  Negative for color change and rash.  Neurological:  Positive for dizziness. Negative for numbness and headaches.  Psychiatric/Behavioral:  Positive for dysphoric mood. The patient is not nervous/anxious.       Objective:   Vitals:   12/06/20 1117  BP: 128/86  Pulse: 75  Temp: 98 F (36.7 C)  SpO2: 98%   Filed Weights   12/06/20 1117  Weight: 215 lb (97.5 kg)   Body mass index is 36.9 kg/m.  BP Readings from Last 3 Encounters:  12/06/20 128/86  04/13/20 130/84  02/01/20 120/80    Wt Readings from Last 3 Encounters:  12/06/20 215 lb (97.5 kg)  04/13/20 216 lb (98 kg)  02/01/20 213 lb (96.6 kg)     Physical Exam Constitutional: She appears well-developed and well-nourished. No distress.  HENT:  Head: Normocephalic and atraumatic.  Right Ear: External ear normal. Normal ear canal and TM Left Ear: External ear normal.  Normal ear canal and TM Mouth/Throat: Oropharynx is clear and moist.  Eyes: Conjunctivae and EOM are normal.  Neck: Neck supple. No tracheal deviation present. No thyromegaly present.  No carotid bruit  Cardiovascular: Normal rate, regular rhythm and normal heart sounds.   No murmur heard.  No edema. Pulmonary/Chest: Effort normal and breath sounds normal. No respiratory distress. She has no wheezes.  She has no rales.  Breast: deferred   Abdominal: Soft. She exhibits no distension. There is no tenderness.  Lymphadenopathy: She has no cervical adenopathy.  Skin: Skin is warm and dry. She is not diaphoretic.  Psychiatric: She has a normal mood and affect. Her behavior is normal.        Assessment & Plan:   Physical exam: Screening blood work    ordered Immunizations  discussed shingrix Colonoscopy  Up to date  Mammogram  up to date- [facility]  - n/a  - s/p TAH Dexa   Up to date  Eye exams  Up to date  Exercise  none Weight  advised weight loss Substance abuse  none      See Problem List for Assessment and Plan of chronic medical problems.

## 2020-12-06 ENCOUNTER — Other Ambulatory Visit: Payer: Self-pay

## 2020-12-06 ENCOUNTER — Ambulatory Visit (INDEPENDENT_AMBULATORY_CARE_PROVIDER_SITE_OTHER): Payer: BC Managed Care – PPO | Admitting: Internal Medicine

## 2020-12-06 ENCOUNTER — Encounter: Payer: Self-pay | Admitting: Internal Medicine

## 2020-12-06 VITALS — BP 128/86 | HR 75 | Temp 98.0°F | Ht 64.0 in | Wt 215.0 lb

## 2020-12-06 DIAGNOSIS — I10 Essential (primary) hypertension: Secondary | ICD-10-CM | POA: Diagnosis not present

## 2020-12-06 DIAGNOSIS — F320 Major depressive disorder, single episode, mild: Secondary | ICD-10-CM

## 2020-12-06 DIAGNOSIS — H5789 Other specified disorders of eye and adnexa: Secondary | ICD-10-CM

## 2020-12-06 DIAGNOSIS — R413 Other amnesia: Secondary | ICD-10-CM | POA: Diagnosis not present

## 2020-12-06 DIAGNOSIS — Z0000 Encounter for general adult medical examination without abnormal findings: Secondary | ICD-10-CM | POA: Diagnosis not present

## 2020-12-06 DIAGNOSIS — R1032 Left lower quadrant pain: Secondary | ICD-10-CM

## 2020-12-06 DIAGNOSIS — F419 Anxiety disorder, unspecified: Secondary | ICD-10-CM

## 2020-12-06 DIAGNOSIS — E7800 Pure hypercholesterolemia, unspecified: Secondary | ICD-10-CM | POA: Diagnosis not present

## 2020-12-06 DIAGNOSIS — E119 Type 2 diabetes mellitus without complications: Secondary | ICD-10-CM | POA: Diagnosis not present

## 2020-12-06 DIAGNOSIS — F32A Depression, unspecified: Secondary | ICD-10-CM

## 2020-12-06 HISTORY — DX: Left lower quadrant pain: R10.32

## 2020-12-06 HISTORY — DX: Other specified disorders of eye and adnexa: H57.89

## 2020-12-06 LAB — LIPID PANEL
Cholesterol: 257 mg/dL — ABNORMAL HIGH (ref 0–200)
HDL: 52.8 mg/dL (ref >39.00)
LDL Cholesterol: 189 mg/dL — ABNORMAL HIGH (ref 0–99)
NonHDL: 204.03
Total CHOL/HDL Ratio: 5
Triglycerides: 75 mg/dL (ref 0.0–149.0)
VLDL: 15 mg/dL (ref 0.0–40.0)

## 2020-12-06 LAB — COMPREHENSIVE METABOLIC PANEL
ALT: 11 U/L (ref 0–35)
AST: 14 U/L (ref 0–37)
Albumin: 4.7 g/dL (ref 3.5–5.2)
Alkaline Phosphatase: 53 U/L (ref 39–117)
BUN: 19 mg/dL (ref 6–23)
CO2: 27 mEq/L (ref 19–32)
Calcium: 9.6 mg/dL (ref 8.4–10.5)
Chloride: 104 mEq/L (ref 96–112)
Creatinine, Ser: 0.59 mg/dL (ref 0.40–1.20)
GFR: 96.31 mL/min (ref >60.00)
Glucose, Bld: 87 mg/dL (ref 70–99)
Potassium: 3.8 mEq/L (ref 3.5–5.1)
Sodium: 142 mEq/L (ref 135–145)
Total Bilirubin: 0.6 mg/dL (ref 0.2–1.2)
Total Protein: 7.4 g/dL (ref 6.0–8.3)

## 2020-12-06 LAB — CBC WITH DIFFERENTIAL/PLATELET
Basophils Absolute: 0 10*3/uL (ref 0.0–0.1)
Basophils Relative: 0.5 % (ref 0.0–3.0)
Eosinophils Absolute: 0.1 10*3/uL (ref 0.0–0.7)
Eosinophils Relative: 1.2 % (ref 0.0–5.0)
HCT: 46.3 % — ABNORMAL HIGH (ref 36.0–46.0)
Hemoglobin: 15.3 g/dL — ABNORMAL HIGH (ref 12.0–15.0)
Lymphocytes Relative: 22.1 % (ref 12.0–46.0)
Lymphs Abs: 2 10*3/uL (ref 0.7–4.0)
MCHC: 33.1 g/dL (ref 30.0–36.0)
MCV: 88.1 fl (ref 78.0–100.0)
Monocytes Absolute: 0.5 10*3/uL (ref 0.1–1.0)
Monocytes Relative: 5.5 % (ref 3.0–12.0)
Neutro Abs: 6.3 10*3/uL (ref 1.4–7.7)
Neutrophils Relative %: 70.7 % (ref 43.0–77.0)
Platelets: 241 10*3/uL (ref 150.0–400.0)
RBC: 5.26 Mil/uL — ABNORMAL HIGH (ref 3.87–5.11)
RDW: 14.1 % (ref 11.5–15.5)
WBC: 8.8 10*3/uL (ref 4.0–10.5)

## 2020-12-06 LAB — HEMOGLOBIN A1C: Hgb A1c MFr Bld: 6.2 % (ref 4.6–6.5)

## 2020-12-06 LAB — TSH: TSH: 1.85 u[IU]/mL (ref 0.35–4.50)

## 2020-12-06 MED ORDER — ATORVASTATIN CALCIUM 40 MG PO TABS: 40.0000 mg | ORAL_TABLET | Freq: Every day | ORAL | 1 refills | Status: DC

## 2020-12-06 MED ORDER — AMLODIPINE BESYLATE 5 MG PO TABS: ORAL_TABLET | ORAL | 1 refills | Status: DC

## 2020-12-06 MED ORDER — LOSARTAN POTASSIUM 25 MG PO TABS: ORAL_TABLET | ORAL | 1 refills | Status: DC

## 2020-12-06 MED ORDER — METFORMIN HCL 500 MG PO TABS: 1.0000 | ORAL_TABLET | Freq: Two times a day (BID) | ORAL | 1 refills | Status: DC

## 2020-12-06 NOTE — Assessment & Plan Note (Signed)
Chronic BP well controlled Continue amlodipine 5 mg qd, losartan 25 mgqd cmp

## 2020-12-06 NOTE — Assessment & Plan Note (Signed)
Chronic Check lipid panel  Atorvastatin has not been filled in a while - most likely she is not taking this - she is not sure --  Restart atorvastatin 40 mg daily  Regular exercise and healthy diet encouraged

## 2020-12-06 NOTE — Assessment & Plan Note (Signed)
New problem Intermittent x few times Has constipation - that is likely the cause Last colonoscopy 10/20 Start Citracel or benefiber daily Can add stool softner Can also take miralax daily

## 2020-12-06 NOTE — Assessment & Plan Note (Signed)
Denies depression She stopped the celexa and feels she is doing ok Monitor off medication

## 2020-12-06 NOTE — Assessment & Plan Note (Signed)
Chronic Check a1c She thinks she is taking the metformin Continue metformin 500mg  bid - will adjust if needed

## 2020-12-06 NOTE — Assessment & Plan Note (Signed)
Chronic When I brought up memory concerns she did recall she wanted to take about  that, but forgot I do feel there are memory issues Referred to neuropsych Check  tsh

## 2020-12-06 NOTE — Assessment & Plan Note (Signed)
New Improved with visine States her last eye exam was normal - this it was w/in the past year ? Dry eyes She does have a h/o glaucoma I believe - ? Related to this - may not have had a recent eye exam Advised f/u with Dr Katy Fitch Continue visine prn

## 2020-12-26 ENCOUNTER — Encounter: Payer: Self-pay | Admitting: Psychology

## 2021-02-01 ENCOUNTER — Telehealth: Payer: Self-pay

## 2021-02-01 NOTE — Telephone Encounter (Signed)
pt has stated she would like to be tested for colon cancer as her mom had pancreatic cancer and pt states she has not has a bowel movement in a month.

## 2021-02-02 NOTE — Telephone Encounter (Signed)
She had a colonoscopy just less than 2 years so it is highly unlikely she has colon cancer - she may have severe constipation and I would recommend she come in for an appt so we can discuss further

## 2021-02-06 NOTE — Telephone Encounter (Signed)
Called and left message for patient. If she calls back okay to tell her what Dr. Quay Burow recommended and make appointment.

## 2021-02-08 IMAGING — CR DG CHEST 2V
2 series · 2 of 2 positions shown · non-contrast
Comparison: 10/27/2018

CLINICAL DATA: Pt has complaints of dizziness and a "weird
sensation" on the left side of her chest that started this morning.
Pt reports feeling like she was going to pass out. Pt has a hx of
diabetes and hypertension.

EXAM:
CHEST - 2 VIEW

[w chest pa]
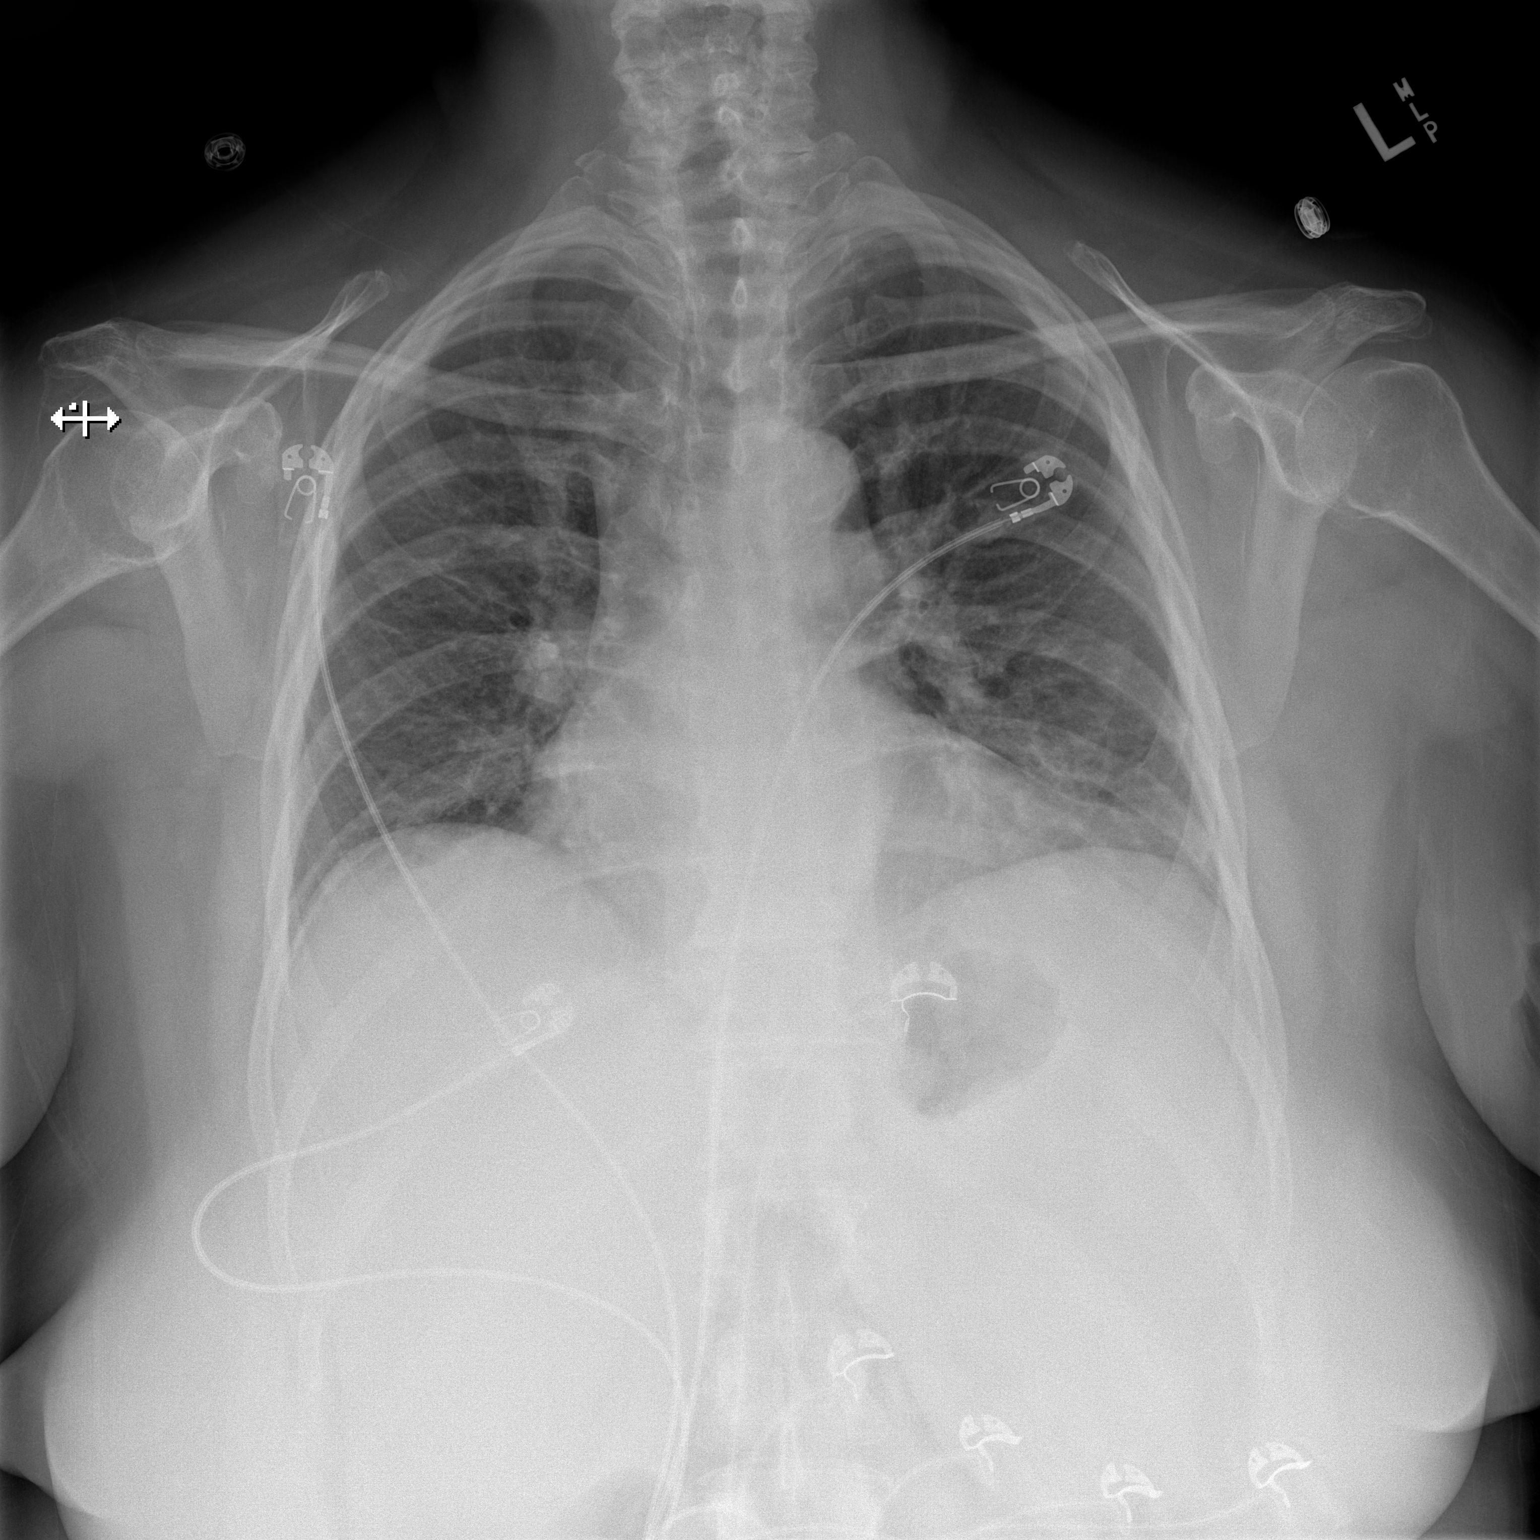

[w chest lat]
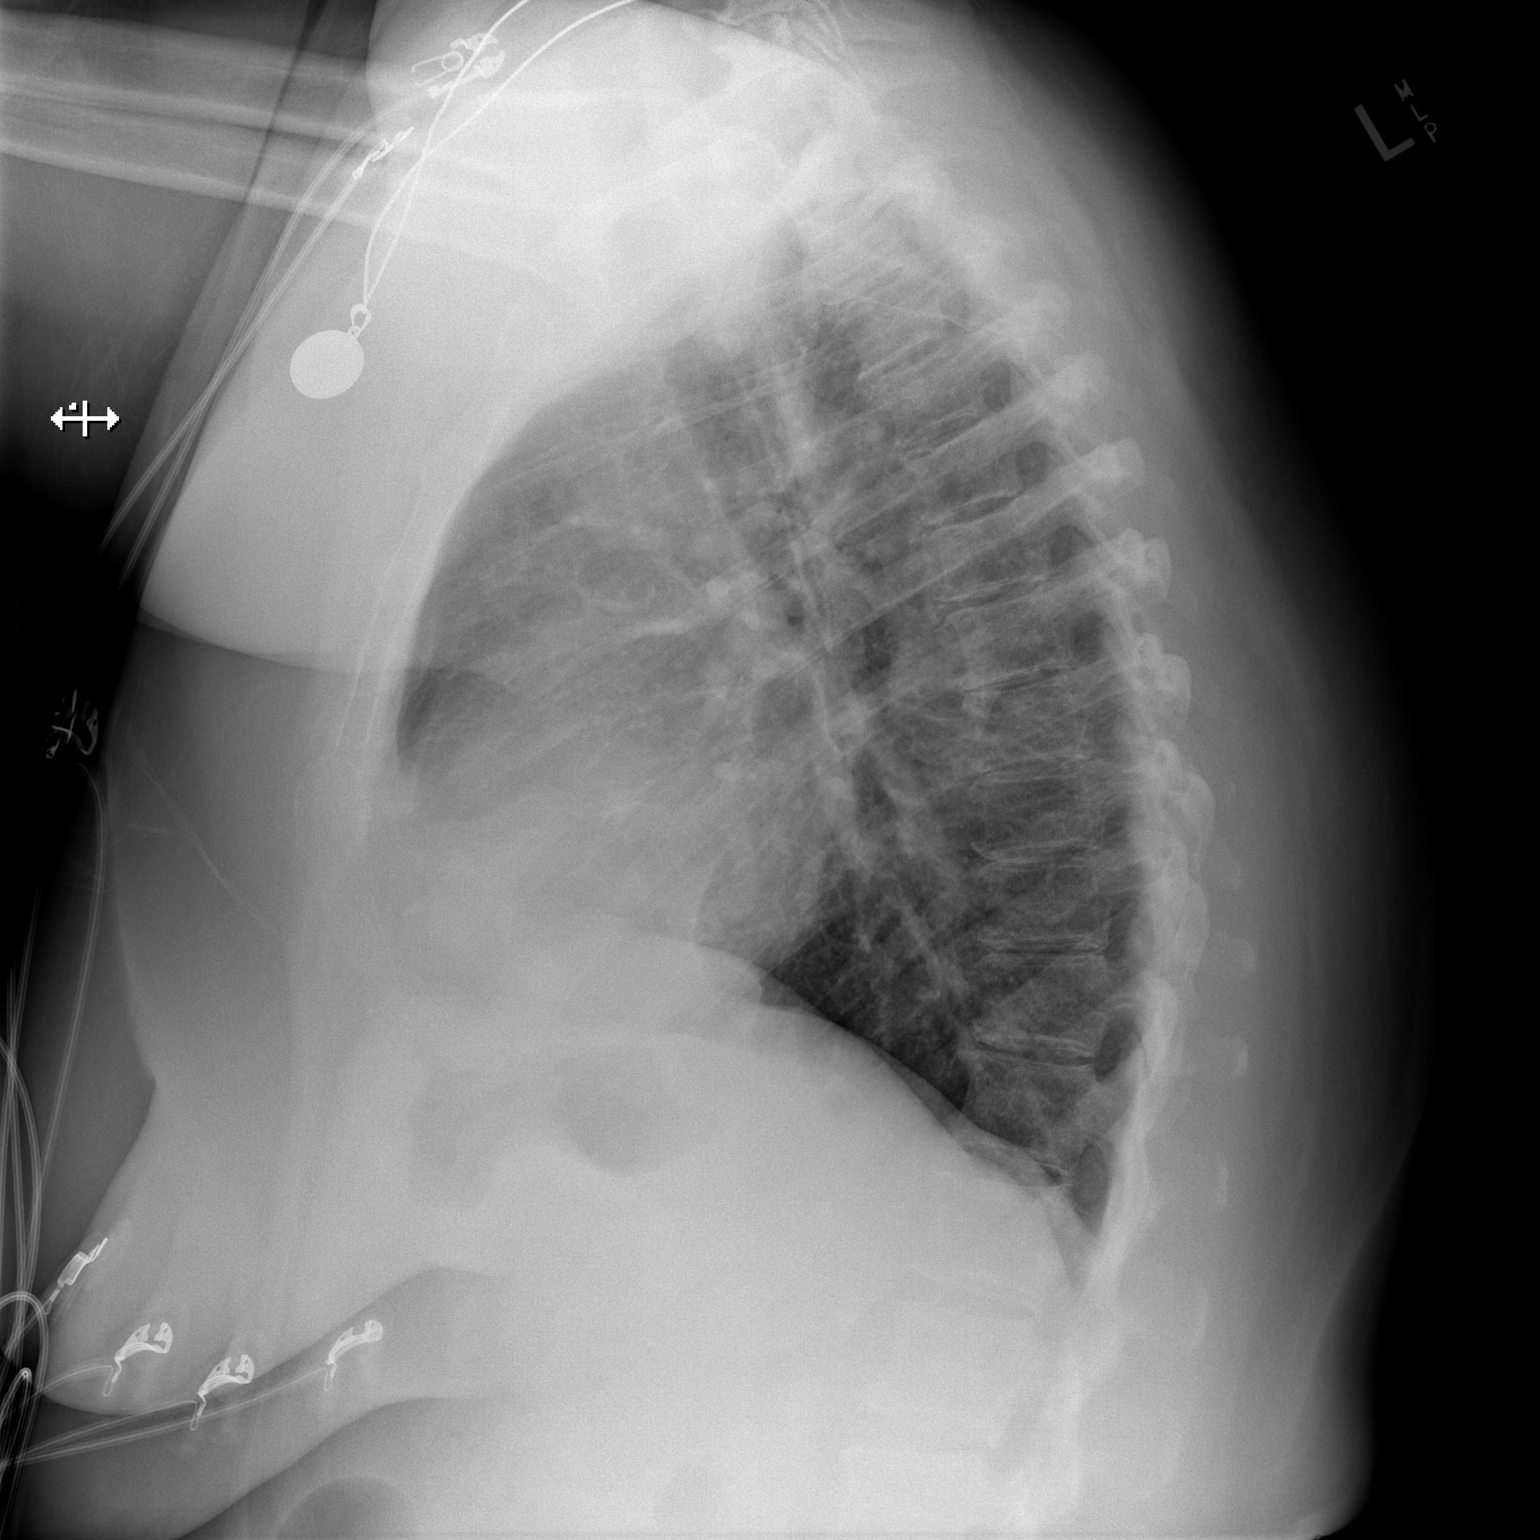

[2 of 2 positions shown; findings below may reference images not displayed]

FINDINGS: Cardiac silhouette is normal in size. No mediastinal or hilar
masses. No evidence of adenopathy.

Clear lungs.  No pleural effusion or pneumothorax.

Skeletal structures are intact.
IMPRESSION: No active cardiopulmonary disease.

## 2021-02-13 NOTE — Progress Notes (Addendum)
Subjective:    Patient ID: Katherine Lawrence, female    DOB: 1955/11/25, 65 y.o.   MRN: [id number]  HPI The patient is here for an acute visit.   1 month  - she feels like she has to go to the bathroom and nothing will come out.  She has LLQ pain - mostly when she is in the car.  She can go days or weeks w/o a BM.  She is not exercising.  She states she is taking plenty of fluids.  She denies any blood in stool or black stool.  She denies heartburn, but does belch a lot.  She was concerned because her mother had pancreatic cancer.      Colonoscopy 03/21/2019:  one 6 mm polyp in rectum.  Mild diverticulosis in left colon.  Otherwise normal.     Medications and allergies reviewed with patient and updated if appropriate.  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   Glaucoma 07/14/2017   Osteoarthritis of left hip 05/10/2017   Memory difficulties 04/13/2015   Type 2 diabetes mellitus without complication, without long-term current use of insulin ([place]) 04/13/2015   Obese 03/21/2015   S/P total knee replacement using cement 02/14/2014   Osteoarthritis of left knee    Concentration deficit    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 For cholesterol 90 tablet 1   blood glucose meter kit and supplies KIT Dispense based on patient and insurance preference. Use up to four times daily as directed. (FOR E11.9). 1 each 0   losartan (COZAAR) 25 MG tablet TAKE 1 TABLET(25 MG) BY MOUTH DAILY for blood pressure 90 tablet 1   metFORMIN (GLUCOPHAGE) 500 MG tablet Take 1 tablet (500 mg total) by mouth 2 (two) times daily with a meal. For diabetes 180 tablet 1   No current facility-administered medications on file prior to visit.    Past Medical History:  Diagnosis Date   Allergy    CIN I (cervical intraepithelial neoplasia I) 1996   cryo...  cone of cervix   DEPRESSION    Diabetes type 2, controlled ([place])    Dysrhythmia    Endometrial polyp 07/21/2011   Glaucoma    Heart murmur    age 17    HYPERCHOLESTEROLEMIA    HYPERTENSION    Osteoarthritis    hips and knees   Palpitations 08/05/2019   Pneumonia 06/2003   Postsurgical menopause     Past Surgical History:  Procedure Laterality Date   ABDOMINAL HYSTERECTOMY  08/22/2011  CERVICAL CONE BIOPSY  1996   CESAREAN SECTION     x1   COLONOSCOPY W/ POLYPECTOMY  12/2008   x3   DILATION AND CURETTAGE OF UTERUS     KNEE ARTHROTOMY Left 08/15/2019   Procedure: Left knee arthrotomy; scar excision;  Surgeon: Gaynelle Arabian, MD;  Location: [facility];  Service: Orthopedics;  Laterality: Left;  10mn   TONSILLECTOMY     TOTAL HIP ARTHROPLASTY Right 2008   TOTAL HIP ARTHROPLASTY Left 05/20/2017   Procedure: LEFT TOTAL HIP ARTHROPLASTY ANTERIOR APPROACH;  Surgeon: AGaynelle Arabian MD;  Location: [facility];  Service: Orthopedics;  Laterality: Left;   TOTAL KNEE ARTHROPLASTY Left 02/14/2014   DR WDurward Fortes  TOTAL KNEE ARTHROPLASTY Left 02/14/2014   Procedure: TOTAL KNEE ARTHROPLASTY;  Surgeon: PGarald Balding MD;  Location: [facility]  Service: Orthopedics;   Laterality: Left;   TOTAL KNEE REVISION Left 07/14/2018   Procedure: TOTAL KNEE REVISION;  Surgeon: AGaynelle Arabian MD;  Location: [facility];  Service: Orthopedics;  Laterality: Left;  Adductor Block    Social History   Socioeconomic History   Marital status: Married    Spouse name: Not on file   Number of children: 2   Years of education: Not on file   Highest education level: Not on file  Occupational History   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   Comment: works from home  Tobacco Use   Smoking status: Former    Packs/day: 0.25    Years: 1.00    Pack years: 0.25    Types: Cigarettes    Quit date: 07/08/2010    Years since quitting: 10.6   Smokeless tobacco: Never  Vaping Use   Vaping Use: Never used  Substance and Sexual Activity   Alcohol use: Yes    Alcohol/week: 0.0 standard drinks    Comment: Rare   Drug use: No   Sexual activity: Not Currently    Comment: 1st intercourse- 14, partners- 149 married- 89yrs   Other Topics Concern   Not on file  Social History Narrative   Lives with spouse; grown kids   Pt is also a [employer] broker   Regular exercise-yes   Social Determinants of Health   Financial Resource Strain: Not on file  Food Insecurity: Not on file  Transportation Needs: Not on file  Physical Activity: Not on file  Stress: Not on file  Social Connections: Not on file    Family History  Problem Relation Age of Onset   Pancreatic cancer Mother 340  Hypertension Father    Heart disease Father    Diabetes Son    Colon cancer Neg Hx    Esophageal cancer Neg Hx    Stomach cancer Neg Hx    Rectal cancer Neg Hx     Review of Systems  Constitutional:  Negative for fever.  Respiratory:  Negative for shortness of breath.   Cardiovascular:  Negative for chest pain.  Gastrointestinal:  Positive for abdominal distention (bloating), abdominal pain (LLQ) and constipation. Negative for blood in stool, diarrhea and nausea.       No gerd   Genitourinary:  Negative for dysuria and frequency.  Neurological:  Positive for headaches.  Psychiatric/Behavioral:  Positive for decreased concentration. Negative for dysphoric mood.       Objective:   Vitals:   02/14/21 0916  BP: 134/64  Pulse: 72  Temp: 98.1 F (36.7 C)  SpO2: 97%   BP Readings from Last 3  Encounters:  02/14/21 134/64  12/06/20 128/86  04/13/20 130/84   Wt Readings from Last 3 Encounters:  02/14/21 211 lb (95.7 kg)  12/06/20 215 lb (97.5 kg)  04/13/20 216 lb (98 kg)   Body mass index is 36.22 kg/m.  Depression screen [facility] 2/9 02/14/2021 05/24/2018 05/11/2017  Decreased Interest 0 0 0  Down, Depressed, Hopeless 0 0 0  PHQ - 2 Score 0 0 0  Altered sleeping 1 0 -  Tired, decreased energy 0 0 -  Change in appetite 0 0 -  Feeling bad or failure about yourself  0 0 -  Trouble concentrating 3 0 -  Moving slowly or fidgety/restless 0 0 -  Suicidal thoughts 0 0 -  PHQ-9 Score 4 0 -  Difficult doing work/chores Not difficult at all - -    GAD 7 : Generalized Anxiety Score 02/14/2021  Nervous, Anxious, on Edge 1  Control/stop worrying 1  Worry too much - different things 0  Trouble relaxing 0  Restless 0  Easily annoyed or irritable 0  Afraid - awful might happen 2  Total GAD 7 Score 4  Anxiety Difficulty Somewhat difficult       Physical Exam Constitutional:      General: She is not in acute distress.    Appearance: Normal appearance. She is not ill-appearing.  HENT:     Head: Normocephalic and atraumatic.  Cardiovascular:     Rate and Rhythm: Normal rate and regular rhythm.  Pulmonary:     Effort: Pulmonary effort is normal. No respiratory distress.     Breath sounds: No wheezing or rales.  Abdominal:     General: There is no distension.     Palpations: Abdomen is soft. There is no mass.     Tenderness: There is abdominal tenderness (Mild in left lower quadrant). There is no guarding or rebound.     Hernia: No hernia is present.   Musculoskeletal:     Cervical back: Neck supple. No tenderness.     Right lower leg: No edema.     Left lower leg: No edema.  Lymphadenopathy:     Cervical: No cervical adenopathy.  Skin:    General: Skin is warm and dry.  Neurological:     Mental Status: She is alert.           Assessment & Plan:    I spent 20 minutes dedicated to the care of this patient on the date of this encounter including review of recent labs, imaging and procedures, obtaining history, communicating with the patient, ordering medications, and documenting clinical information in the EHR   Screened for depression using the PHQ 9 scale.  Her score is borderline positive - she denies depression.  She does have some concentration issues.  She is soon to be evaluated by neuropsych for memory issues.      See Problem List for Assessment and Plan of chronic medical problems.    This visit occurred during the SARS-CoV-2 public health emergency.  Safety protocols were in place, including screening questions prior to the visit, additional usage of staff PPE, and extensive cleaning of exam room while observing appropriate contact time as indicated for disinfecting solutions.

## 2021-02-14 ENCOUNTER — Encounter: Payer: Self-pay | Admitting: Internal Medicine

## 2021-02-14 ENCOUNTER — Ambulatory Visit (INDEPENDENT_AMBULATORY_CARE_PROVIDER_SITE_OTHER): Payer: BC Managed Care – PPO | Admitting: Internal Medicine

## 2021-02-14 ENCOUNTER — Other Ambulatory Visit: Payer: Self-pay

## 2021-02-14 VITALS — BP 134/64 | HR 72 | Temp 98.1°F | Ht 64.0 in | Wt 211.0 lb

## 2021-02-14 DIAGNOSIS — K5900 Constipation, unspecified: Secondary | ICD-10-CM | POA: Diagnosis not present

## 2021-02-14 DIAGNOSIS — Z1331 Encounter for screening for depression: Secondary | ICD-10-CM | POA: Diagnosis not present

## 2021-02-14 DIAGNOSIS — Z23 Encounter for immunization: Secondary | ICD-10-CM | POA: Diagnosis not present

## 2021-02-14 HISTORY — DX: Constipation, unspecified: K59.00

## 2021-02-14 MED ORDER — POLYETHYLENE GLYCOL 3350 17 GM/SCOOP PO POWD: 17.0000 g | Freq: Two times a day (BID) | ORAL | 1 refills | Status: DC | PRN

## 2021-02-14 MED ORDER — DOCUSATE SODIUM 100 MG PO CAPS: 100.0000 mg | ORAL_CAPSULE | Freq: Two times a day (BID) | ORAL | 5 refills | Status: DC

## 2021-02-14 NOTE — Assessment & Plan Note (Addendum)
Acute Started 1 month ago without obvious cause Colonoscopy just less than 2 years ago so I am not concerned about colon cancer or colon mass She does have some presumed memory issues-to have memory soon, but I do not think she is a very good historian Advised to continue increased water intake.  Encouraged her to be as active as possible Discussed starting daily Benefiber and a stool softener-Colace, prescription sent to her pharmacy so that they can help her get the correct medication Also advised starting MiraLAX twice daily until she cleans her self out and then she can take this daily or as needed Discussed with her that we may need to adjust medications depending on how she does over the next few weeks-advised that she can call or message me through [place]

## 2021-02-14 NOTE — Patient Instructions (Addendum)
    Medications changes include :   start colace twice daily.  Start benefiber daily. Start miralax twice daily until you feel emptied out.       Your prescription(s) have been submitted to your pharmacy. Please take as directed and contact our office if you believe you are having problem(s) with the medication(s).   Constipation, Adult Constipation is when a person has trouble pooping (having a bowel movement). When you have this condition, you may poop fewer than 3 times a week. Your poop (stool) may also be dry, hard, or bigger than normal. Follow these instructions at home: Eating and drinking  Eat foods that have a lot of fiber, such as: Fresh fruits and vegetables. Whole grains. Beans. Eat less of foods that are low in fiber and high in fat and sugar, such as: [place] fries. Hamburgers. Cookies. Candy. Soda. Drink enough fluid to keep your pee (urine) pale yellow. General instructions Exercise regularly or as told by your doctor. Try to do 150 minutes of exercise each week. Go to the restroom when you feel like you need to poop. Do not hold it in. Take over-the-counter and prescription medicines only as told by your doctor. These include any fiber supplements. When you poop: Do deep breathing while relaxing your lower belly (abdomen). Relax your pelvic floor. The pelvic floor is a group of muscles that support the rectum, bladder, and intestines (as well as the uterus in women). Watch your condition for any changes. Tell your doctor if you notice any. Keep all follow-up visits as told by your doctor. This is important. Contact a doctor if: You have pain that gets worse. You have a fever. You have not pooped for 4 days. You vomit. You are not hungry. You lose weight. You are bleeding from the opening of the butt (anus). You have thin, pencil-like poop. Get help right away if: You have a fever, and your symptoms suddenly get worse. You leak poop or have blood in your  poop. Your belly feels hard or bigger than normal (bloated). You have very bad belly pain. You feel dizzy or you faint. Summary Constipation is when a person poops fewer than 3 times a week, has trouble pooping, or has poop that is dry, hard, or bigger than normal. Eat foods that have a lot of fiber. Drink enough fluid to keep your pee (urine) pale yellow. Take over-the-counter and prescription medicines only as told by your doctor. These include any fiber supplements. This information is not intended to replace advice given to you by your health care provider. Make sure you discuss any questions you have with your health care provider. Document Revised: 04/20/2019 Document Reviewed: 04/20/2019 Elsevier Patient Education  [place].

## 2021-03-12 ENCOUNTER — Encounter: Payer: Self-pay | Admitting: Psychology

## 2021-03-12 ENCOUNTER — Other Ambulatory Visit: Payer: Self-pay

## 2021-03-12 ENCOUNTER — Ambulatory Visit (INDEPENDENT_AMBULATORY_CARE_PROVIDER_SITE_OTHER): Payer: BC Managed Care – PPO | Admitting: Psychology

## 2021-03-12 ENCOUNTER — Ambulatory Visit: Payer: BC Managed Care – PPO | Admitting: Psychology

## 2021-03-12 DIAGNOSIS — F0670 Mild neurocognitive disorder due to known physiological condition without behavioral disturbance: Secondary | ICD-10-CM | POA: Insufficient documentation

## 2021-03-12 DIAGNOSIS — F411 Generalized anxiety disorder: Secondary | ICD-10-CM | POA: Diagnosis not present

## 2021-03-12 DIAGNOSIS — G309 Alzheimer's disease, unspecified: Secondary | ICD-10-CM | POA: Diagnosis not present

## 2021-03-12 DIAGNOSIS — G3184 Mild cognitive impairment, so stated: Secondary | ICD-10-CM | POA: Insufficient documentation

## 2021-03-12 DIAGNOSIS — I499 Cardiac arrhythmia, unspecified: Secondary | ICD-10-CM | POA: Insufficient documentation

## 2021-03-12 DIAGNOSIS — R4189 Other symptoms and signs involving cognitive functions and awareness: Secondary | ICD-10-CM

## 2021-03-12 DIAGNOSIS — Z8673 Personal history of transient ischemic attack (TIA), and cerebral infarction without residual deficits: Secondary | ICD-10-CM

## 2021-03-12 HISTORY — DX: Mild cognitive impairment, so stated: G31.84

## 2021-03-12 HISTORY — DX: Mild neurocognitive disorder due to known physiological condition without behavioral disturbance: F06.70

## 2021-03-12 HISTORY — DX: Alzheimer's disease, unspecified: G30.9

## 2021-03-12 NOTE — Progress Notes (Signed)
   Psychometrician Note   Cognitive testing was administered to Katherine Lawrence by Milana Kidney, B.S. (psychometrist) under the supervision of Dr. Christia Reading, Ph.D., licensed psychologist on 03/12/21. Katherine Lawrence did not appear overtly distressed by the testing session per behavioral observation or responses across self-report questionnaires. Rest breaks were offered.    The battery of tests administered was selected by Dr. Christia Reading, Ph.D. with consideration to Katherine Lawrence's current level of functioning, the nature of her symptoms, emotional and behavioral responses during interview, level of literacy, observed level of motivation/effort, and the nature of the referral question. This battery was communicated to the psychometrist. Communication between Dr. Christia Reading, Ph.D. and the psychometrist was ongoing throughout the evaluation and Dr. Christia Reading, Ph.D. was immediately accessible at all times. Dr. Christia Reading, Ph.D. provided supervision to the psychometrist on the date of this service to the extent necessary to assure the quality of all services provided.    Katherine Lawrence will return within approximately 1-2 weeks for an interactive feedback session with Dr. Melvyn Novas at which time her test performances, clinical impressions, and treatment recommendations will be reviewed in detail. Katherine Lawrence understands she can contact our office should she require our assistance before this time.  A total of 165 minutes of billable time were spent face-to-face with Katherine Lawrence by the psychometrist. This includes both test administration and scoring time. Billing for these services is reflected in the clinical report generated by Dr. Christia Reading, Ph.D.  This note reflects time spent with the psychometrician and does not include test scores or any clinical interpretations made by Dr. Melvyn Novas. The full report will follow in a separate note.

## 2021-03-12 NOTE — Progress Notes (Signed)
NEUROPSYCHOLOGICAL EVALUATION [place]  Date of Evaluation: March 12, 2021  Reason for Referral:   Katherine Lawrence is a 65 y.o. right-handed Caucasian female referred by  Billey Gosling, M.D. , to characterize her current cognitive functioning and assist with diagnostic clarity and treatment planning in the context of subjective cognitive decline and numerous medical/cardiovascular comorbidities.   Assessment and Plan:   Clinical Impression(s): Katherine Lawrence pattern of performance is suggestive of severe impairment surrounding all aspects of verbal learning and memory. Additional impairments were exhibited across confrontation naming, verbal fluency (semantic worse than phonemic), and executive functioning (including a task assessing safety and judgment). Performances were largely appropriate across processing speed, attention/concentration, receptive language, other aspects of expressive language not mentioned above, visuospatial abilities, and visual memory. Katherine Lawrence largely denied difficulties completing instrumental activities of daily living (ADLs) independently. Her husband who was present did not contradict this report. As such, given evidence for cognitive dysfunction described above, she meets criteria for a Mild Neurocognitive Disorder ("mild cognitive impairment"). However, she is likely towards the moderate-severe end of this spectrum and is at risk for transitioning to a major neurocognitive disorder over the next several years.   Regarding etiology, despite Katherine Lawrence's young age, I unfortunately do have concerns surrounding Alzheimer's disease. She demonstrated prominent verbal memory dysfunction with evidence to suggest a deficit with information storage. She also demonstrated impairments in confrontation naming, semantic fluency, and executive functioning. Overall, this does represent a fairly classic pattern of dysfunction in  earlier stages of this disease process. 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 However, upon review of these images, possible areas represented do not involve the hippocampus or other primary memory centers of the brain. As such, while there may be a mild vascular contribution to her current presentation, a vascular etiology is unlikely to fully account for memory impairment or other areas of dysfunction. Behaviorally, she did not exhibit symptoms of Lewy body dementia or the behavioral variant of frontotemporal dementia. Language performance as a whole, both during interview and throughout testing, also does not better reflect the presence of a language variant of frontotemporal dementia. Continued medical monitoring will be important moving forward.  Recommendations: Katherine Lawrence does not appear to be followed by a neurologist at the present time. As such, I will place a referral for her. I would recommend that she discuss memory-based medications when she first meets with this clinician. It is important to highlight that while these medications can slow down functional decline in some individuals, there is no current treatment which can stop or reverse cognitive decline stemming from a neurodegenerative illness.  Given that her last MRI was completed in 2020, I would also recommend that she be referred for updated neuroimaging to ensure that we are not missing a more acute/subacute event which could at least partially explain ongoing cognitive impairment.   Katherine Lawrence husband did report instances of witnessed breath cessation while asleep, which is a red flag for the presence of sleep apnea. Katherine Lawrence reported waking feeling rested and did not have pronounced sleep concerns. She could discuss the pros and cons of a laboratory sleep study with her PCP. If present and untreated, sleep apnea could certainly worsen cognitive abilities.   A repeat neuropsychological evaluation in  18-24 months (or sooner if functional decline is noted) is recommended to assess the trajectory of future cognitive decline should it occur. This will also aid in future  efforts towards improved diagnostic clarity.  Should there be a progression of current deficits over time, Katherine Lawrence is unlikely to regain any independent living skills lost. Therefore, it is recommended that she remain as involved as possible in all aspects of household chores, finances, and medication management, with supervision to ensure adequate performance. She will likely benefit from the establishment and maintenance of a routine in order to maximize her functional abilities over time.  It will be important for Katherine Lawrence to have another person with her when in situations where she may need to process information, weigh the pros and cons of different options, and make decisions, in order to ensure that she fully understands and recalls all information to be considered.  Katherine Lawrence is encouraged to attend to lifestyle factors for brain health (e.g., regular physical exercise, good nutrition habits, regular participation in cognitively-stimulating activities, and general stress management techniques), which are likely to have benefits for both emotional adjustment and cognition. Optimal control of vascular risk factors (including safe cardiovascular exercise and adherence to dietary recommendations) is encouraged. Continued participation in activities which provide mental stimulation and social interaction is also recommended.   Important information to remember should be provided in written formats in all instances. This should be placed in a commonly visited area of her residence to help promote recall.   To address problems with executive dysfunction, she may wish to consider:   -Avoiding external distractions when needing to concentrate   -Limiting exposure to fast paced environments with multiple sensory demands   -Writing down  complicated information and using checklists   -Attempting and completing one task at a time (i.e., no multi-tasking)   -Verbalizing aloud each step of a task to maintain focus   -Reducing the amount of information considered at one time  Review of Records:   Katherine Lawrence was seen by [facility] Billey Gosling, M.D.) on 12/06/2020 for regular follow-up. During that appointment, Dr. Quay Burow reportedly brought up prior concerns surrounding memory and other cognitive abilities. Katherine Lawrence acknowledged chronic memory concerns but no additional details were provided. Ultimately, Katherine Lawrence was referred for a comprehensive neuropsychological evaluation to characterize her cognitive abilities and to assist with diagnostic clarity and treatment planning.   Brain MRI on 04/23/2015 revealed age-advanced cerebral atrophy, most notable in the frontal regions. Brain MRI on 03/23/2019 revealed mild frontal atrophy bilaterally, as well as small deep white matter infarcts in the left hemisphere.   Past Medical History:  Diagnosis Date   Allergy    CIN I (cervical intraepithelial neoplasia I) 1996   cryo...  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   Type 2 diabetes mellitus without complication, without long-term current use of insulin 04/13/2015   Dr Katy Fitch    Past Surgical History:  Procedure Laterality Date   ABDOMINAL HYSTERECTOMY  08/22/2011   CERVICAL CONE BIOPSY  1996   CESAREAN SECTION     x1   COLONOSCOPY W/ POLYPECTOMY  12/2008   x3   DILATION AND CURETTAGE OF UTERUS     KNEE ARTHROTOMY Left 08/15/2019   Procedure: Left knee arthrotomy; scar excision;  Surgeon: Gaynelle Arabian, MD;  Location: [facility];  Service: Orthopedics;  Laterality: Left;  21min   TONSILLECTOMY     TOTAL HIP ARTHROPLASTY Right 2008   TOTAL HIP ARTHROPLASTY Left 05/20/2017   Procedure: LEFT TOTAL HIP ARTHROPLASTY ANTERIOR APPROACH;  Surgeon: Gaynelle Arabian, MD;  Location: [facility];  Service: Orthopedics;  Laterality: Left;   TOTAL KNEE ARTHROPLASTY Left 02/14/2014   DR Durward Fortes   TOTAL KNEE ARTHROPLASTY Left 02/14/2014   Procedure: TOTAL KNEE ARTHROPLASTY;  Surgeon: Garald Balding, MD;  Location: [place];  Service: Orthopedics;  Laterality: Left;   TOTAL KNEE REVISION Left 07/14/2018   Procedure: TOTAL KNEE REVISION;  Surgeon: Gaynelle Arabian, MD;  Location: [facility];  Service: Orthopedics;  Laterality: Left;  Adductor Block    Current Outpatient Medications:    ACCU-CHEK GUIDE test strip, USE UP TO FOUR TIMES DAILY, Disp: , Rfl:    Accu-Chek Softclix Lancets lancets, SMARTSIG:Topical 1 to 4 Times Daily, Disp: , Rfl:    amLODipine (NORVASC) 5 MG tablet, TAKE 1 TABLET(5 MG) BY MOUTH DAILY for blood pressure, Disp: 90 tablet, Rfl: 1   atorvastatin (LIPITOR) 40 MG tablet, Take 1 tablet (40 mg total) by mouth daily. For cholesterol, Disp: 90 tablet, Rfl: 1   blood glucose meter kit and supplies KIT, Dispense based on patient and insurance preference. Use up to four times daily as directed. (FOR E11.9)., Disp: 1 each, Rfl: 0    docusate sodium (COLACE) 100 MG capsule, Take 1 capsule (100 mg total) by mouth 2 (two) times daily., Disp: 60 capsule, Rfl: 5   losartan (COZAAR) 25 MG tablet, TAKE 1 TABLET(25 MG) BY MOUTH DAILY for blood pressure, Disp: 90 tablet, Rfl: 1   metFORMIN (GLUCOPHAGE) 500 MG tablet, Take 1 tablet (500 mg total) by mouth 2 (two) times daily with a meal. For diabetes, Disp: 180 tablet, Rfl: 1   polyethylene glycol powder (GLYCOLAX/MIRALAX) 17 GM/SCOOP powder, Take 17 g by mouth 2 (two) times daily as needed for severe constipation., Disp: 3350 g, Rfl: 1  Clinical Interview:   The following information was obtained during a clinical interview with Katherine Lawrence and her husband prior to cognitive testing.  Cognitive Symptoms: Decreased short-term memory: Endorsed. Katherine Lawrence reported that she generally does not notice significant memory lapses or have pronounced concerns. However, she does report that individuals will often come to her saying that she has been repeating herself or that they already had conversations that Katherine Lawrence does not recall having. Her husband confirmed this, saying that memory concerns have been present for the past few years. Abilities seem to have gradually declined over time, with more visible difficulties occurring during the past 12 months.  Decreased long-term memory: Denied. Decreased attention/concentration: Endorsed. She reported longstanding difficulties with sustained attention, concentration, and ease of distractibility, dating all the way back to childhood and adolescence. She did not report ever being formally  tested for ADHD.  Reduced processing speed: Endorsed. Difficulties with executive functions: Endorsed. Specifically, she reported trouble with indecision. While some of this is longstanding in nature, she did report that it has worsened over time. She denied trouble with organization or impulsivity. No overt personality changes were reported.  Difficulties with emotion  regulation: Denied. Difficulties with receptive language: Denied. Difficulties with word finding: Endorsed. She reported commonly experiencing the tip-of-the-tongue phenomenon, particularly with names of familiar individuals.  Decreased visuoperceptual ability: Denied.  Difficulties completing ADLs: Largely denied. She manages her medications independently and reported driving without issue. Her and her husband co-manage finances, with her husband having to take a more active role over the years. She did acknowledge at least one previous time where she forgot to pay a bill.   Additional Medical History: History of traumatic brain injury/concussion: Denied. History of stroke: A prior brain MRI in 2020 revealed very small deep white matter infarcts in the left hemisphere. These were said to occur sometime between 2016 and 2020 and were largely asymptomatic per Katherine Lawrence.  History of seizure activity: Denied. History of known exposure to toxins: Denied. Symptoms of chronic pain: Endorsed. She reported pronounced pain and discomfort radiating down the left side of her body, particularly her hip and knee. The cause for left-sided pain was unknown. Pain symptoms were said to always be present and noticeable, but manageable overall.  Experience of frequent headaches/migraines: Denied. Frequent instances of dizziness/vertigo: Endorsed. However, symptoms were isolated to instances where Katherine Lawrence stands up quickly.   Sensory changes: Acutely (i.e., during the past two weeks), she reported significant blurred vision, especially when not wearing her glasses. The cause for this was said to be unknown. Medical records do mention a history of glaucoma. Other sensory changes/difficulties (e.g., hearing, taste, or smell) were denied.  Balance/coordination difficulties: Endorsed. Her and her husband reported longstanding and unchanged generalized clumsiness with a long history of falls. This was said to be unchanged  currently with one side of the body not exhibiting worse instability relative to the other.  Other motor difficulties: Denied.  Sleep History: Estimated hours obtained each night: 8 hours.  Difficulties falling asleep: Denied. Difficulties staying asleep: Denied. Feels rested and refreshed upon awakening: Endorsed.  History of snoring: Endorsed. History of waking up gasping for air: Denied. Witnessed breath cessation while asleep: Endorsed. Specifically, her husband described somewhat infrequent instances where he has observed Ms. Kishimoto briefly stop breathing while asleep. Neither of them reported any prior concerns surrounding the presence of sleep apnea.   History of vivid dreaming: Denied. Excessive movement while asleep: Denied. Instances of acting out her dreams: Denied.  Psychiatric/Behavioral Health History: Depression: Ms. Wolpert acknowledged a history of mild depressive symptoms, but stated that these symptoms are in full remission and that she no longer takes anti-depressant medication. She described her current mood as "okay." Current or remote suicidal ideation, intent, or plan was denied.  Anxiety: Endorsed. She reported both acute and chronic symptoms of generalized anxiety. Acute symptoms were said to be mild overall.  Mania: Denied. Trauma History: Denied. Visual/auditory hallucinations: Denied. Delusional thoughts: Denied.  Tobacco: Denied. Alcohol: She denied current alcohol consumption as well as a history of problematic alcohol abuse or dependence.  Recreational drugs: Denied.  Family History: Problem Relation Age of Onset   Pancreatic cancer Mother 62   Hypertension Father    Heart disease Father    Diabetes Son    Colon cancer Neg Hx    Esophageal cancer Neg  Hx    Stomach cancer Neg Hx    Rectal cancer Neg Hx    This information was confirmed by Ms. Laverne.  Academic/Vocational History: Highest level of educational attainment: 14 years. She graduated from  high school and completed an additional two years of college. She described herself as a good (A/B) student in academic settings. Math was noted as a relative weakness, along with science-based courses to a lesser extent.  History of developmental delay: Denied. History of grade repetition: Denied. Enrollment in special education courses: Denied. History of LD/ADHD: Denied.  Employment: Retired. 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   Evaluation Results:   Behavioral Observations: Ms. Kitamura was accompanied by her husband, arrived to her appointment on time, and was appropriately dressed and groomed. She appeared alert and oriented. She ambulated slowly with the assistance of a cane and exhibited generally mild balance instability. Gross motor functioning appeared intact upon informal observation and no abnormal movements (e.g., tremors) were noted. Her affect was generally relaxed and positive. Spontaneous speech was fluent and word finding difficulties were not observed during the clinical interview. Thought processes were coherent, organized, and normal in content. Insight into her cognitive difficulties appeared somewhat adequate. However, memory dysfunction is likely far greater than what she realizes. During testing, sustained attention was appropriate. Task engagement was adequate and she persisted when challenged. Overall, Ms. Mignogna was cooperative with the clinical interview and subsequent testing procedures.   Adequacy of Effort: The validity of neuropsychological testing is limited by the extent to which the individual being tested may be assumed to have exerted adequate effort during testing. Ms. Pietila expressed her intention to perform to the best of her abilities and exhibited adequate task engagement and persistence. Scores across stand-alone and embedded performance validity measures were largely within expectation.  One below expectation performance was at chance and likely reflective of true, severe memory impairment rather than poor engagement or attempts to perform poorly. As such, the results of the current evaluation are believed to be a valid representation of Ms. Whack's current cognitive functioning.  Test Results: Ms. Markgraf was largely oriented at the time of the current evaluation. One point was lost for her not being able to name the current clinic.   Intellectual abilities based upon educational and vocational attainment were estimated to be in the average range. Premorbid abilities were estimated to be within the average range based upon a single-word reading test.   Processing speed was below average to average. Basic attention was above average. More complex attention (e.g., working memory) was average. Executive functioning was exceptionally low to well below average. She also performed in the exceptionally low range on a task assessing safety and judgment. Points were lost due to some vague responses, as well as instances where she did not appear to comprehend the question and exhibited some confusion. For example, when asked about the importance of her doctor knowing all medications she is currently taking, she responded with "could be that it hurts you, you could die." When asked to explain a medication stating a 25% chance of having serious side effects, she responded with "could mean anything."   Assessed receptive language abilities were above average. Likewise, Ms. Goings did not exhibit any difficulties comprehending task instructions and answered all questions asked of her appropriately. Assessed expressive language (e.g., verbal fluency and confrontation naming) was variable. Verbal fluency was exceptionally low to well below average,  with semantic fluency exhibiting greater dysfunction. Confrontation naming was also well below average. Other aspects of expressive language (i.e., writing samples,  bill payment, description of visual stimuli) were average.     Assessed visuospatial/visuoconstructional abilities were mildly variable but overall appropriate, ranging from the below average to above average normative ranges.    Learning (i.e., encoding) of novel information was average on a visual task but exceptionally low across three verbal tasks. Spontaneous delayed recall (i.e., retrieval) of previously learned information was below average across a visual task but exceptionally low to well below average across all verbal tasks. Retention rates were 79% across a story learning task, 0% across a list learning task, and 67% across a shape learning task. Performance across recognition tasks was average on a visual task but exceptionally low to well below average across verbal tasks, suggesting limited evidence for information consolidation.   Results of emotional screening instruments suggested that recent symptoms of generalized anxiety were in the moderate range, while symptoms of depression were within normal limits. A screening instrument assessing recent sleep quality suggested the presence of minimal sleep dysfunction.  Tables of Scores:   Note: This summary of test scores accompanies the interpretive report and should not be considered in isolation without reference to the appropriate sections in the text. Descriptors are based on appropriate normative data and may be adjusted based on clinical judgment. Terms such as "Within Normal Limits" and "Outside Normal Limits" are used when a more specific description of the test score cannot be determined.       Percentile - Normative Descriptor > 98 - Exceptionally High 91-97 - Well Above Average 75-90 - Above Average 25-74 - Average 9-24 - Below Average 2-8 - Well Below Average < 2 - Exceptionally Low       Validity:   DESCRIPTOR       ACS Word Choice: --- --- Outside Normal Limits  Dot Counting Test: --- --- Within Normal Limits  NAB  EVI: --- --- Within Normal Limits  D-KEFS Color Word Effort Index: --- --- Within Normal Limits       Orientation:      Raw Score Percentile   NAB Orientation, Form 1 28/29 --- ---       Cognitive Screening:      Raw Score Percentile   SLUMS: 15/30 --- ---       Intellectual Functioning:      Standard Score Percentile   Test of Premorbid Functioning: 108 70 Average       Memory:     NAB Memory Module, Form 1: Standard Score/ T Score Percentile   Total Memory Index 57 <1 Exceptionally Low  List Learning       Total Trials 1-3 4/36 (19) <1 Exceptionally Low    List B 1/12 (26) 1 Exceptionally Low    Short Delay Free Recall 0/12 (19) <1 Exceptionally Low    Long Delay Free Recall 0/12 (23) <1 Exceptionally Low    Retention Percentage 0 (11) <1 Exceptionally Low    Recognition Discriminability 1 (29) 2 Well Below Average  Shape Learning       Total Trials 1-3 15/27 (48) 42 Average    Delayed Recall 4/9 (39) 14 Below Average    Retention Percentage 67 (39) 14 Below Average    Recognition Discriminability 6 (47) 38 Average  Story Learning       Immediate Recall 24/80 (20) <1 Exceptionally Low    Delayed Recall 11/40 (28) 2 Well Below  Average    Retention Percentage 79 (41) 18 Below Average  Daily Living Memory       Immediate Recall 19/51 (19) <1 Exceptionally Low    Delayed Recall 2/17 (19) <1 Exceptionally Low    Retention Percentage 22 (-3) <1 Exceptionally Low    Recognition Hits 1/10 (-22) <1 Exceptionally Low       Attention/Executive Function:     Trail Making Test (TMT): Raw Score (T Score) Percentile     Part A 39 secs.,  1 error (40) 16 Below Average    Part B 181 secs.,  1 error (29) 2 Exceptionally Low         Scaled Score Percentile   WAIS-IV Coding: 6 9 Below Average       NAB Attention Module, Form 1: T Score Percentile     Digits Forward 60 84 Above Average    Digits Backwards 43 25 Average        Scaled Score Percentile   WAIS-IV Similarities: 4 2  Well Below Average       D-KEFS Color-Word Interference Test: Raw Score (Scaled Score) Percentile     Color Naming 40 secs. (7) 16 Below Average    Word Reading 24 secs. (10) 50 Average    Inhibition 102 secs. (4) 2 Well Below Average      Total Errors 4 errors (8) 25 Average    Inhibition/Switching 180 secs. (1) <1 Exceptionally Low      Total Errors 17 errors (1) <1 Exceptionally Low       NAB Executive Functions Module, Form 1: T Score Percentile     Judgment 25 1 Exceptionally Low       Language:     Verbal Fluency Test: Raw Score (T Score) Percentile     Phonemic Fluency (FAS) 22 (33) 5 Well Below Average    Animal Fluency 5 (11) <1 Exceptionally Low        NAB Language Module, Form 1: Standard Score/ T Score Percentile   Total Score 94 34 Average    Oral Production 51 54 Average    Auditory Comprehension 57 75 Above Average    Naming 27/31 (32) 4 Well Below Average    Writing 54 44 Average    Bill Payment 52 58 Average       Visuospatial/Visuoconstruction:      Raw Score Percentile   Clock Drawing: 10/10 --- Within Normal Limits       NAB Spatial Module, Form 1: T Score Percentile     Figure Drawing Copy 61 86 Above Average        Scaled Score Percentile   WAIS-IV Block Design: 7 16 Below Average       Mood and Personality:      Raw Score Percentile   Beck Depression Inventory - II: 13 --- Within Normal Limits  PROMIS Anxiety Questionnaire: 22 --- Moderate       Additional Questionnaires:      Raw Score Percentile   PROMIS Sleep Disturbance Questionnaire: 11 --- None to Slight   Informed Consent and Coding/Compliance:   The current evaluation represents a clinical evaluation for the purposes previously outlined by the referral source and is in no way reflective of a forensic evaluation.   Ms. Kuk was provided with a verbal description of the nature and purpose of the present neuropsychological evaluation. Also reviewed were the foreseeable risks and/or  discomforts and benefits of the procedure, limits of confidentiality, and mandatory reporting requirements  of this provider. The patient was given the opportunity to ask questions and receive answers about the evaluation. Oral consent to participate was provided by the patient.   This evaluation was conducted by Christia Reading, Ph.D., licensed clinical neuropsychologist. Ms. Kram completed a clinical interview with Dr. Melvyn Novas, billed as one unit [phone], and 165 minutes of cognitive testing and scoring, billed as one unit [phone] and five additional units [id number]. Psychometrist Milana Kidney, B.S., assisted Dr. Melvyn Novas with test administration and scoring procedures. As a separate and discrete service, Dr. Melvyn Novas spent a total of 160 minutes in interpretation and report writing billed as one unit [phone] and two units 96133.

## 2021-03-25 ENCOUNTER — Other Ambulatory Visit: Payer: Self-pay

## 2021-03-25 ENCOUNTER — Ambulatory Visit (INDEPENDENT_AMBULATORY_CARE_PROVIDER_SITE_OTHER): Payer: BC Managed Care – PPO | Admitting: Psychology

## 2021-03-25 DIAGNOSIS — G309 Alzheimer's disease, unspecified: Secondary | ICD-10-CM

## 2021-03-25 DIAGNOSIS — F0670 Mild neurocognitive disorder due to known physiological condition without behavioral disturbance: Secondary | ICD-10-CM | POA: Diagnosis not present

## 2021-03-25 NOTE — Progress Notes (Signed)
   Neuropsychology Feedback Session [name]. [place] Department of Neurology  Reason for Referral:   Katherine Lawrence is a 65 y.o. right-handed Caucasian female referred by  Billey Gosling, M.D. , to characterize her current cognitive functioning and assist with diagnostic clarity and treatment planning in the context of subjective cognitive decline and numerous medical/cardiovascular comorbidities.   Feedback:   Katherine Lawrence completed a comprehensive neuropsychological evaluation on 03/12/2021. Please refer to that encounter for the full report and recommendations. Briefly, results suggested severe impairment surrounding all aspects of verbal learning and memory. Additional impairments were exhibited across confrontation naming, verbal fluency (semantic worse than phonemic), and executive functioning (including a task assessing safety and judgment). Regarding etiology, despite Katherine Lawrence's young age, I unfortunately do have concerns surrounding Alzheimer's disease. She demonstrated prominent verbal memory dysfunction with evidence to suggest a deficit with information storage. She also demonstrated impairments in confrontation naming, semantic fluency, and executive functioning. Overall, this does represent a fairly classic pattern of dysfunction in earlier stages of this disease process. 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 However, upon review of these images, possible areas represented do not involve the hippocampus or other primary memory centers of the brain. As such, while there may be a mild vascular contribution to her current presentation, a vascular etiology is unlikely to fully account for memory impairment or other areas of dysfunction.  Katherine Lawrence was accompanied by her husband during the current feedback session. Content of the current session focused on the results of her neuropsychological evaluation. Katherine Lawrence was given the opportunity to ask  questions and her questions were answered. She was encouraged to reach out should additional questions arise. A copy of her report was provided at the conclusion of the visit.      25 minutes were spent conducting the current feedback session with Katherine Lawrence, billed as one unit [phone].

## 2021-03-26 ENCOUNTER — Encounter: Payer: Self-pay | Admitting: Physician Assistant

## 2021-03-26 ENCOUNTER — Ambulatory Visit (INDEPENDENT_AMBULATORY_CARE_PROVIDER_SITE_OTHER): Payer: BC Managed Care – PPO | Admitting: Physician Assistant

## 2021-03-26 VITALS — BP 183/112 | HR 83 | Resp 20 | Ht 64.0 in | Wt 204.0 lb

## 2021-03-26 DIAGNOSIS — F0670 Mild neurocognitive disorder due to known physiological condition without behavioral disturbance: Secondary | ICD-10-CM | POA: Diagnosis not present

## 2021-03-26 DIAGNOSIS — G309 Alzheimer's disease, unspecified: Secondary | ICD-10-CM | POA: Diagnosis not present

## 2021-03-26 MED ORDER — DONEPEZIL HCL 10 MG PO TABS: ORAL_TABLET | ORAL | 11 refills | Status: DC

## 2021-03-26 NOTE — Patient Instructions (Signed)
It was a pleasure to see you today at our office.   Recommendations:  Meds: Follow up in 3  months We will start donepezil half tablet (5mg ) daily for 2  weeks.  If you are tolerating the medication, then after 2 weeks, we will increase the dose to a full tablet of 10 mg daily.  Side effects include nausea, vomiting, diarrhea, vivid dreams, and muscle cramps.  Please call the clinic if you experience any of these symptoms.    RECOMMENDATIONS FOR ALL PATIENTS WITH MEMORY PROBLEMS: 1. Continue to exercise (Recommend 30 minutes of walking everyday, or 3 hours every week) 2. Increase social interactions - continue going to [place] and enjoy social gatherings with friends and family 3. Eat healthy, avoid fried foods and eat more fruits and vegetables 4. Maintain adequate blood pressure, blood sugar, and blood cholesterol level. Reducing the risk of stroke and cardiovascular disease also helps promoting better memory. 5. Avoid stressful situations. Live a simple life and avoid aggravations. Organize your time and prepare for the next day in anticipation. 6. Sleep well, avoid any interruptions of sleep and avoid any distractions in the bedroom that may interfere with adequate sleep quality 7. Avoid sugar, avoid sweets as there is a strong link between excessive sugar intake, diabetes, and cognitive impairment We discussed the Mediterranean diet, which has been shown to help patients reduce the risk of progressive memory disorders and reduces cardiovascular risk. This includes eating fish, eat fruits and green leafy vegetables, nuts like almonds and hazelnuts, walnuts, and also use olive oil. Avoid fast foods and fried foods as much as possible. Avoid sweets and sugar as sugar use has been linked to worsening of memory function.  There is always a concern of gradual progression of memory problems. If this is the case, then we may need to adjust level of care according to patient needs. Support, both to the  patient and caregiver, should then be put into place.    The Alzheimer's Association is here all day, every day for people facing Alzheimer's disease through our free 24/7 Helpline: ([phone])539-7849. The Helpline provides reliable information and support to all those who need assistance, such as individuals living with memory loss, Alzheimer's or other dementia, caregivers, health care professionals and the public.  Our highly trained and knowledgeable staff can help you with: Understanding memory loss, dementia and Alzheimer's  Medications and other treatment options  General information about aging and brain health  Skills to provide quality care and to find the best care from professionals  Legal, financial and living-arrangement decisions Our Helpline also features: Confidential care consultation provided by master's level clinicians who can help with decision-making support, crisis assistance and education on issues families face every day  Help in a caller's preferred language using our translation service that features more than 200 languages and dialects  Referrals to local community programs, services and ongoing support     FALL PRECAUTIONS: Be cautious when walking. Scan the area for obstacles that may increase the risk of trips and falls. When getting up in the mornings, sit up at the edge of the bed for a few minutes before getting out of bed. Consider elevating the bed at the head end to avoid drop of blood pressure when getting up. Walk always in a well-lit room (use night lights in the walls). Avoid area rugs or power cords from appliances in the middle of the walkways. Use a walker or a cane if necessary and consider physical therapy  for balance exercise. Get your eyesight checked regularly.  FINANCIAL OVERSIGHT: Supervision, especially oversight when making financial decisions or transactions is also recommended.  HOME SAFETY: Consider the safety of the kitchen when operating  appliances like stoves, microwave oven, and blender. Consider having supervision and share cooking responsibilities until no longer able to participate in those. Accidents with firearms and other hazards in the house should be identified and addressed as well.   ABILITY TO BE LEFT ALONE: If patient is unable to contact 911 operator, consider using LifeLine, or when the need is there, arrange for someone to stay with patients. Smoking is a fire hazard, consider supervision or cessation. Risk of wandering should be assessed by caregiver and if detected at any point, supervision and safe proof recommendations should be instituted.  MEDICATION SUPERVISION: Inability to self-administer medication needs to be constantly addressed. Implement a mechanism to ensure safe administration of the medications.   DRIVING: Regarding driving, in patients with progressive memory problems, driving will be impaired. We advise to have someone else do the driving if trouble finding directions or if minor accidents are reported. Independent driving assessment is available to determine safety of driving.   If you are interested in the driving assessment, you can contact the following:  [employer] in [place]  [place] [phone] or [phone]      [place] refers to food and lifestyle choices that are based on the traditions of countries located on the [employer]. This way of eating has been shown to help prevent certain conditions and improve outcomes for people who have chronic diseases, like kidney disease and heart disease. What are tips for following this plan? Lifestyle  Cook and eat meals together with your family, when possible. Drink enough fluid to keep your urine clear or pale yellow. Be physically active every day. This includes: Aerobic  exercise like running or swimming. Leisure activities like gardening, walking, or housework. Get 7-8 hours of sleep each night. If recommended by your health care provider, drink red wine in moderation. This means 1 glass a day for nonpregnant women and 2 glasses a day for men. A glass of wine equals 5 oz (150 mL). Reading food labels  Check the serving size of packaged foods. For foods such as rice and pasta, the serving size refers to the amount of cooked product, not dry. Check the total fat in packaged foods. Avoid foods that have saturated fat or trans fats. Check the ingredients list for added sugars, such as corn syrup. Shopping  At the grocery store, buy most of your food from the areas near the walls of the store. This includes: Fresh fruits and vegetables (produce). Grains, beans, nuts, and seeds. Some of these may be available in unpackaged forms or large amounts (in bulk). Fresh seafood. Poultry and eggs. Low-fat dairy products. Buy whole ingredients instead of prepackaged foods. Buy fresh fruits and vegetables in-season from local farmers markets. Buy frozen fruits and vegetables in resealable bags. If you do not have access to quality fresh seafood, buy precooked frozen shrimp or canned fish, such as tuna, salmon, or sardines. Buy small amounts of raw or cooked vegetables, salads, or olives from the deli or salad bar at your store. Stock your pantry so you always have certain foods on hand, such as olive oil, canned tuna, canned tomatoes, rice, pasta, and beans. Cooking  Cook foods with extra-virgin olive  oil instead of using butter or other vegetable oils. Have meat as a side dish, and have vegetables or grains as your main dish. This means having meat in small portions or adding small amounts of meat to foods like pasta or stew. Use beans or vegetables instead of meat in common dishes like chili or lasagna. Experiment with different cooking methods. Try roasting or broiling  vegetables instead of steaming or sauteing them. Add frozen vegetables to soups, stews, pasta, or rice. Add nuts or seeds for added healthy fat at each meal. You can add these to yogurt, salads, or vegetable dishes. Marinate fish or vegetables using olive oil, lemon juice, garlic, and fresh herbs. Meal planning  Plan to eat 1 vegetarian meal one day each week. Try to work up to 2 vegetarian meals, if possible. Eat seafood 2 or more times a week. Have healthy snacks readily available, such as: Vegetable sticks with hummus. Greek yogurt. Fruit and nut trail mix. Eat balanced meals throughout the week. This includes: Fruit: 2-3 servings a day Vegetables: 4-5 servings a day Low-fat dairy: 2 servings a day Fish, poultry, or lean meat: 1 serving a day Beans and legumes: 2 or more servings a week Nuts and seeds: 1-2 servings a day Whole grains: 6-8 servings a day Extra-virgin olive oil: 3-4 servings a day Limit red meat and sweets to only a few servings a month What are my food choices? Mediterranean diet Recommended Grains: Whole-grain pasta. Brown rice. Bulgar wheat. Polenta. Couscous. Whole-wheat bread. [place]. Vegetables: Artichokes. Beets. Broccoli. Cabbage. Carrots. Eggplant. Green beans. Chard. Kale. Spinach. Onions. Leeks. Peas. Squash. Tomatoes. Peppers. Radishes. Fruits: Apples. Apricots. Avocado. Berries. Bananas. Cherries. Dates. Figs. Grapes. Lemons. Melon. Oranges. Peaches. Plums. Pomegranate. Meats and other protein foods: Beans. Almonds. Sunflower seeds. Pine nuts. Peanuts. [place]. Salmon. Scallops. Shrimp. [place]. Tilapia. Clams. Oysters. Eggs. Dairy: Low-fat milk. Cheese. Greek yogurt. Beverages: Water. Red wine. Herbal tea. Fats and oils: Extra virgin olive oil. Avocado oil. Grape seed oil. Sweets and desserts: [place] yogurt with honey. Baked apples. Poached pears. Trail mix. Seasoning and other foods: Basil. Cilantro. Coriander. Cumin. Mint. Parsley. Sage. Rosemary.  Tarragon. Garlic. Oregano. Thyme. Pepper. Balsalmic vinegar. Tahini. Hummus. Tomato sauce. Olives. Mushrooms. Limit these Grains: Prepackaged pasta or rice dishes. Prepackaged cereal with added sugar. Vegetables: Deep fried potatoes (french fries). Fruits: Fruit canned in syrup. Meats and other protein foods: Beef. Pork. Lamb. Poultry with skin. Hot dogs. [place] Salines. Dairy: Ice cream. Sour cream. Whole milk. Beverages: Juice. Sugar-sweetened soft drinks. Beer. Liquor and spirits. Fats and oils: Butter. Canola oil. Vegetable oil. Beef fat (tallow). Lard. Sweets and desserts: Cookies. Cakes. Pies. Candy. Seasoning and other foods: Mayonnaise. Premade sauces and marinades. The items listed may not be a complete list. Talk with your dietitian about what dietary choices are right for you. Summary The Mediterranean diet includes both food and lifestyle choices. Eat a variety of fresh fruits and vegetables, beans, nuts, seeds, and whole grains. Limit the amount of red meat and sweets that you eat. Talk with your health care provider about whether it is safe for you to drink red wine in moderation. This means 1 glass a day for nonpregnant women and 2 glasses a day for men. A glass of wine equals 5 oz (150 mL). This information is not intended to replace advice given to you by your health care provider. Make sure you discuss any questions you have with your health care provider. Document Released: 01/24/2016 Document Revised: 02/26/2016 Document Reviewed: 01/24/2016  Chartered certified accountant Patient Education  [employer].

## 2021-03-26 NOTE — Progress Notes (Signed)
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  Discussed the importance of regular daily schedule with inclusion of crossword puzzles to maintain brain function.  Continue to monitor mood by PCP Stay active at least 30 minutes at least 3 times a week.  Naps should be scheduled and should be no longer than 60 minutes and should not occur after 2 PM.  Mediterranean diet is recommended  Start donepezil 10 mg daily Take half tablet (5 mg) daily for 2 weeks, then increase to the full tablet at 10 mg daily  Side effects were discussed Repeat Neuropsychological evaluation in 18-24 months  Follow up in 3 months.   Case discussed with Katherine Lawrence who agrees with the plan     Subjective:   ED visits since last seen: none  Hospital admissions: none  Katherine Lawrence is a 65 y.o. female with a history of CIN-1, hypertension, arthritis with failed total knee replacement, anxiety, depression, hyperlipidemia, diabetes type 2, history of CVA 2021, seen today in follow up for new diagnosis of mild neurocognitive disorder likely due to Alzheimer's disease per neuropsychological evaluation 03/12/2021.This patient is accompanied in the office by her who supplements the history.  Previous records as well as any outside records available were reviewed prior to todays visit.  In review, the patient has been having memory deficits for the last 3 years, when she was being evaluated for other neurological complaints regarding peripheral neuropathy.  At the time, she had hesitation during conversations,, with have to stop and think what she was going to say.  She was offered early retirement back in 2020 after being laid off during [place] pandemic, and at the time, she had to write down everything she was going to say if she had to speak on conference calls, otherwise she will go blank.  She has issues with  driving, getting lost, unable to recall how to get to the places she had been for years.  She also had issues when cooking, leaving the stove on.  She was forgetting medications as well.  Slums on 04/21/2019 done over the phone was 17/24.  Neuropsychological evaluation was ordered, but she failed to follow-up until now.  She had a significant issues with arthritis, CVA in 2021, and pain issues had hindered her follow-up.  Since then, her memory appears to be worse, she continues to experience the above complaints.  She repeats herself, has "memory lapses "where she does not recall conversations, and this has been witnessed by others.  She does report decreased attention and concentration, she has difficulty with word finding "deep of the tongue phenomenon ". After her neurocognitive exam, she was referred here for the management of her disease and further plans. She feels more depressed than before, knowing her cognitive deficiencies.  However she sleeps well, denies vivid dreams, sleepwalking or hallucinations or paranoia.  She denies leaving objects in unusual places.  She is independent of bathing and dressing, and she is still good at taking her medications.  Her appetite is good, denies trouble swallowing.  She continues to cook, and has to remind herself to leave the stove off.  She uses a cane to ambulate to prevent falls.  She is not very active.  She denies having as much myalgias as before.  She denies any headaches, dizziness, vision changes, focal numbness, tingling or weakness.  She continues to drive,  and she uses a GPA because she is afraid of getting lost.   Neuropsychological evaluation 03/12/21 :  results suggested severe impairment surrounding all aspects of verbal learning and memory. Additional impairments were exhibited across confrontation naming, verbal fluency (semantic worse than phonemic), and executive functioning (including a task assessing safety and judgment). Regarding etiology,  despite Katherine Lawrence's young age, I unfortunately do have concerns surrounding Alzheimer's disease. She demonstrated prominent verbal memory dysfunction with evidence to suggest a deficit with information storage. She also demonstrated impairments in confrontation naming, semantic fluency, and executive functioning. Overall, this does represent a fairly classic pattern of dysfunction in earlier stages of this disease process. 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 However, upon review of these images, possible areas represented do not involve the hippocampus or other primary memory centers of the brain. As such, while there may be a mild vascular contribution to her current presentation, a vascular etiology is unlikely to fully account for memory impairment or other areas of dysfunction.    Brain MRI on 04/23/2015 revealed age-advanced cerebral atrophy, most notable in the frontal regions. Brain MRI on 03/23/2019 revealed mild frontal atrophy bilaterally, as well as small deep white matter infarcts in the left hemisphere.  Negative for acute infarct. Mild chronic ischemia left cerebral white matter has developed since 2016. Mild frontal atrophy.    History of Present Illness 04/21/2019:  The patient had a telephone visit on 04/21/2019. She was last seen in the neurology clinic 10 months ago for myalgias. EMG/NCV did not show any evidence of myopathy, bloodwork no evidence of inflammation. She was seen in 2016 for memory changes. MRI brain in 04/2015 unremarkable. She reports that since her last visit, she is noticing more cognitive changes. She has noticed more hesitation during conversations, she would have to stop and think of what she was going to say. She was laid off in October and offered early retirement, she noticed toward the end that she had to write down everything she was going to say if she had to speak on conference calls. She would go blank when called on to discuss  something. She has gotten lost driving, unable to recall how to get to places she has been to for years. She has left the stove on. She infrequently forgets medications. She denies missing bill payments. Sleep and mood are good. She had a knee replacement with continued pain. She denies any headaches, dizziness, vision changes, focal numbness/tingling/weakness. She is not having as much myalgias as before. She had a repeat MRI brain without contrast done 03/2019 did not show any acute changes. 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    Laboratory Data: Recent Labs       Lab Results  Component Value Date    TSH 3.05 03/01/2019      Recent Labs       Lab Results  Component Value Date    VITAMINB12 895 03/01/2019        Recent Labs       Lab Results  Component Value Date    CHOL 205 (H) 03/01/2019    HDL 49.10 03/01/2019    LDLCALC 133 (H) 03/01/2019    LDLDIRECT 157.6 11/16/2012    TRIG 113.0 03/01/2019    CHOLHDL 4 03/01/2019        History on INitial Assessment 06/21/2018: This is a pleasant 65 year old right-handed woman  who I had previously seen in October 2016 for evaluation of memory changes and poor concentration. She had an MRI brain which was unremarkable. She presents today for evaluation of different symptoms of muscle pain, neck pain, and poor balance. She reports that the muscle pains started right after her hip replacement in 2012 or so. After she went home and started doing exercises, she started noticing pain in her arms. On review of records, she had been reporting severe muscle pains in her arms > legs since 2012, repeat CK levels have been normal. She describes pain as an ache that has progressively worsened over the years that she cannot lift her arms up or put anything away in overhead cabinets. She describes weakness and pain in her arms, shoulders, and neck. She denies back pain but has  the pain in her legs, causing difficulties climbing stairs. She does have knee issues as well and uses a cane, with left knee replacement surgery scheduled at the end of this month. She does not take Advil for regular pain or when she can walk, taking it only when pain is really bad or  before she cleans the house and would definitely need it after cleaning, taking 5 tablets at one time 2-3 times a week. She denies any numbness or tingling in her extremities, but has noticed burning on the base of her left thumb, making it difficult to move. She has muscle cramps in her legs, worse at night. She denies any family history of similar symptoms. No prior falls or accidents.    She denies any headaches, dysarthria/dysphagia, bowel/bladder dysfunction. Vision is occasionally blurred, no diplopia. She reports dizziness described as a sensation of unsteadiness like she will fall. She continues to note memory issues. Her husband teases her that she repeats herself. She worked for [employer] for 36 years and all of a sudden could not remember what to do. She occasionally gets lost driving, she has to think of how to get to familiar places. She missed a bill payment 2 months ago. She is pretty good remembering medications. Mood is good, she has noticed a difference when she forgets her citalopram.     PREVIOUS MEDICATIONS:   CURRENT MEDICATIONS:  Outpatient Encounter Medications as of 03/26/2021  Medication Sig   ACCU-CHEK GUIDE test strip USE UP TO FOUR TIMES DAILY   Accu-Chek Softclix Lancets lancets SMARTSIG:Topical 1 to 4 Times Daily   amLODipine (NORVASC) 5 MG tablet TAKE 1 TABLET(5 MG) BY MOUTH DAILY for blood pressure   atorvastatin (LIPITOR) 40 MG tablet Take 1 tablet (40 mg total) by mouth daily. For cholesterol   blood glucose meter kit and supplies KIT Dispense based on patient and insurance preference. Use up to four times daily as directed. (FOR E11.9).   docusate sodium (COLACE) 100 MG capsule  Take 1 capsule (100 mg total) by mouth 2 (two) times daily.   losartan (COZAAR) 25 MG tablet TAKE 1 TABLET(25 MG) BY MOUTH DAILY for blood pressure   metFORMIN (GLUCOPHAGE) 500 MG tablet Take 1 tablet (500 mg total) by mouth 2 (two) times daily with a meal. For diabetes   polyethylene glycol powder (GLYCOLAX/MIRALAX) 17 GM/SCOOP powder Take 17 g by mouth 2 (two) times daily as needed for severe constipation.   No facility-administered encounter medications on file as of 03/26/2021.     Objective:     PHYSICAL EXAMINATION:    VITALS:   Vitals:   03/26/21 1519  BP: (!) 183/112  Pulse: 83  Resp: 20  SpO2: 99%  Weight: 204 lb (92.5 kg)  Height: 5' 4" (1.626 m)    GEN:  The patient appears stated age and is in NAD. HEENT:  Normocephalic, atraumatic.   Neurological examination:  General: NAD, well-groomed, appears stated age. Orientation: The patient is alert. Oriented to person, place and date Cranial nerves: There is good facial symmetry.The speech is not fluent, but clear. Mild aphasia  noted, without dysarthria. Fund of knowledge is reduced. Recent and remote memory are impaired. Attention and concentration are reduced.  Able to name objects and repeat phrases.  Hearing is intact to conversational tone.    Sensation: Sensation is intact to light touch throughout Motor: Strength is at least antigravity x4. Tremors: none  DTR's 2/4 in [facility] Cognitive Assessment  06/21/2018 04/13/2015  Visuospatial/ Executive (0/5) 4 3  Naming (0/3) 3 3  Attention: Read list of digits (0/2) 2 2  Attention: Read list of letters (0/1) 1 1  Attention: Serial 7 subtraction starting at 100 (0/3) 3 3  Language: Repeat phrase (0/2) 2 2  Language : Fluency (0/1) 0 0  Abstraction (0/2) 2 2  Delayed Recall (0/5) 0 2  Orientation (0/6) 6 6  Total 23 24  Adjusted Score (based on education) - 24   No flowsheet data found.  [employer] Mental Exam 04/26/2019  Weekday Correct 1   Current year 1  What state are we in? 1  Amount spent 1  Amount left 2  # of Animals 1  5 objects recall 2  Number series 2  Hour markers 0  Time correct 0  Placed X in triangle correctly 0  Largest Figure 0  Name of female 2  Date back to work 0  Type of work 2  State she lived in 2  Total score 17       Movement examination: Tone: There is normal tone in the UE/LE Abnormal movements:  no tremor.  No myoclonus.  No asterixis.   Coordination:  There is no decremation with RAM's. Normal finger to nose  Gait and Station: The patient has no difficulty arising out of a deep-seated chair without the use of the hands. The patient's stride length is good.  Gait is cautious and narrow, uses a cane to ambulate.      Total time spent on today's visit was 40 minutes, including both face-to-face time and nonface-to-face time. Time included that spent on review of records (prior notes available to me/labs/imaging if pertinent), discussing treatment and goals, answering patient's questions and coordinating care.  Cc:  Binnie Rail, MD Sharene Butters, PA-C

## 2021-04-11 ENCOUNTER — Encounter: Payer: Self-pay | Admitting: Internal Medicine

## 2021-04-11 DIAGNOSIS — Z1231 Encounter for screening mammogram for malignant neoplasm of breast: Secondary | ICD-10-CM | POA: Diagnosis not present

## 2021-05-05 NOTE — Patient Instructions (Addendum)
     Medications changes include :   an oral antibiotic and antibiotic eye drops   Your prescription(s) have been submitted to your pharmacy. Please take as directed and contact our office if you believe you are having problem(s) with the medication(s).   Please call if there is no improvement in your symptoms.

## 2021-05-05 NOTE — Progress Notes (Signed)
Subjective:    Patient ID: Katherine Lawrence, female    DOB: 27-Feb-1956, 65 y.o.   MRN: [id number]  This visit occurred during the SARS-CoV-2 public health emergency.  Safety protocols were in place, including screening questions prior to the visit, additional usage of staff PPE, and extensive cleaning of exam room while observing appropriate contact time as indicated for disinfecting solutions.    HPI The patient is here for an acute visit.   Last weekend, about 10 days ago, she went to pick up a pizza and her arms just dropped and they could not pick up anything.  Her eyes turned green ( they are blue).  She had crust all around her eyes.  This all happened at the same time.    Her arms are back to normal now.  That night they were back to normal - no issues since then - no weakness.  No neck pain or N/T in the arms.   Her eyes continue to be irritated, red looking.  No itching.  No eye pain.  Using Visine eye drops.  She has cold symptoms.     Medications and allergies reviewed with patient and updated if appropriate.  Patient Active Problem List   Diagnosis Date Noted   Dysrhythmia 03/12/2021   Generalized anxiety disorder 03/12/2021   Mild neurocognitive disorder due to Alzheimer's disease, possible 03/12/2021   Constipation 02/14/2021   Eye irritation 12/06/2020   LLQ pain 12/06/2020   Neck pain 04/13/2020   Snoring 12/07/2019   History of CVA (cerebrovascular accident) 12/06/2019   Thumb pain 06/01/2019   Greater trochanteric pain syndrome 02/18/2018   Glaucoma 07/14/2017   Osteoarthritis of left hip 05/10/2017   Type 2 diabetes mellitus without complication, without long-term current use of insulin 04/13/2015   Obesity 03/21/2015   S/P total knee replacement using cement 02/14/2014   Osteoarthritis of left knee    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 For cholesterol 90 tablet 1   blood glucose meter kit and supplies KIT Dispense based on patient and insurance preference. Use up to four times daily as directed. (FOR E11.9). 1 each 0   docusate sodium (COLACE) 100 MG capsule Take 1 capsule (100 mg total) by mouth 2 (two) times daily. 60 capsule 5   donepezil (ARICEPT) 10 MG tablet Take half tablet (5 mg) daily for 2 weeks, then increase to the full tablet at 10 mg daily 30 tablet 11   losartan (COZAAR) 25 MG tablet TAKE 1 TABLET(25 MG) BY MOUTH DAILY for blood pressure 90 tablet 1   metFORMIN (GLUCOPHAGE) 500 MG tablet Take 1 tablet (500 mg total) by mouth 2 (two) times daily with a meal. For diabetes 180 tablet 1   polyethylene glycol powder (GLYCOLAX/MIRALAX) 17 GM/SCOOP powder Take 17 g by mouth 2 (two) times daily as needed for severe constipation. 3350 g 1   No current facility-administered medications on file  prior to visit.    Past Medical History:  Diagnosis Date   Allergy    CIN I (cervical intraepithelial neoplasia I) 1996   cryo...  cone of cervix   Constipation 02/14/2021   Dysrhythmia    Endometrial polyp 07/21/2011   Essential hypertension 04/03/2008   Eye irritation 12/06/2020   Failed total knee arthroplasty 07/14/2018   Failed total left knee replacement 07/14/2018   Generalized anxiety disorder    Glaucoma    Greater trochanteric pain syndrome 02/18/2018   Heart murmur    age 70    History of CVA (cerebrovascular accident) 12/06/2019   Seen on MRI - Small deep white matter infarcts on the Left. 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   Osteoarthritis of left hip 05/10/2017   Osteoarthritis of left knee    Pending L TKR fall 2015 - [facility]   Palpitations 08/05/2019   Pneumonia 06/2003   Postsurgical menopause    Pure hypercholesterolemia 04/03/2008   S/P total knee replacement using cement 02/14/2014   Type 2 diabetes mellitus without complication, without long-term current use of insulin 04/13/2015   Dr Katy Fitch    Past Surgical History:  Procedure Laterality Date   ABDOMINAL HYSTERECTOMY  08/22/2011   CERVICAL CONE BIOPSY  1996   CESAREAN SECTION     x1   COLONOSCOPY W/ POLYPECTOMY  12/2008   x3   DILATION AND CURETTAGE OF UTERUS     KNEE ARTHROTOMY Left 08/15/2019   Procedure: Left knee arthrotomy; scar excision;  Surgeon: Gaynelle Arabian, MD;  Location: [facility];  Service: Orthopedics;  Laterality: Left;  13min   TONSILLECTOMY     TOTAL HIP ARTHROPLASTY Right 2008   TOTAL HIP ARTHROPLASTY Left 05/20/2017   Procedure: LEFT TOTAL HIP ARTHROPLASTY ANTERIOR APPROACH;  Surgeon: Gaynelle Arabian, MD;  Location: [facility];  Service: Orthopedics;  Laterality: Left;   TOTAL KNEE ARTHROPLASTY Left 02/14/2014   DR Durward Fortes   TOTAL KNEE ARTHROPLASTY Left 02/14/2014   Procedure: TOTAL KNEE ARTHROPLASTY;  Surgeon: Garald Balding, MD;  Location: [facility];  Service: Orthopedics;  Laterality: Left;   TOTAL KNEE REVISION Left 07/14/2018   Procedure: TOTAL KNEE REVISION;  Surgeon: Gaynelle Arabian, MD;  Location: [facility];  Service: Orthopedics;  Laterality: Left;  Adductor Block    Social History   Socioeconomic History   Marital status: Married    Spouse name: Not on file   Number of children: 2   Years of education: 14   Highest education level: Some college, no degree   Occupational History   Occupation: Retired    Comment: [employer]  Tobacco Use   Smoking status: Former    Packs/day: 0.25    Years: 1.00    Pack years: 0.25    Types: Cigarettes    Quit date: 07/08/2010    Years since quitting: 10.8   Smokeless tobacco: Never  Vaping Use   Vaping Use: Never used  Substance and Sexual Activity   Alcohol use: Not Currently    Comment: Rare   Drug use: No   Sexual activity: Not Currently    Comment: 1st intercourse- 14, partners- 73, married- 59 yrs   Other Topics Concern   Not on file  Social History Narrative   Lives with  spouse; grown kids   Pt is also a Ship broker.   Regular exercise-yes   Social Determinants of Health   Financial Resource Strain: Not on file  Food Insecurity: Not on file  Transportation Needs: Not on file  Physical Activity: Not on file  Stress: Not on file  Social Connections: Not on file    Family History  Problem Relation Age of Onset   Pancreatic cancer Mother 69   Hypertension Father    Heart disease Father    Diabetes Son    Colon cancer Neg Hx    Esophageal cancer Neg Hx    Stomach cancer Neg Hx    Rectal cancer Neg Hx     Review of Systems  Constitutional:  Negative for chills and fever.  HENT:  Positive for congestion, ear pain (left ear), rhinorrhea and voice change. Negative for postnasal drip, sinus pressure, sinus pain and sore throat.   Eyes:  Positive for photophobia (mild), discharge, redness and visual disturbance (blurry vision). Negative for pain and itching.  Respiratory:  Positive for cough (sounds wet) and wheezing. Negative for shortness of breath.   Cardiovascular:  Negative for chest pain.  Gastrointestinal:  Negative for nausea.  Musculoskeletal:  Positive for myalgias.  Neurological:  Negative for dizziness, light-headedness and headaches.      Objective:   Vitals:   05/06/21 0841  BP: (!) 146/86  Pulse: 71  Temp: 98.6 F (37 C)  SpO2: 95%   BP  Readings from Last 3 Encounters:  05/06/21 (!) 146/86  03/26/21 (!) 183/112  02/14/21 134/64   Wt Readings from Last 3 Encounters:  05/06/21 202 lb (91.6 kg)  03/26/21 204 lb (92.5 kg)  02/14/21 211 lb (95.7 kg)   Body mass index is 34.67 kg/m.   Physical Exam    GENERAL APPEARANCE: Appears stated age, well appearing, NAD EYES: conjunctiva erythematous, mild eyelid swelling, slight discharge/crusting on edges of eye lids, no icterus HENT: hoarse, bilateral tympanic membranes and ear canals normal, oropharynx with no erythema or exudates, trachea midline, no cervical or supraclavicular lymphadenopathy LUNGS: Unlabored breathing, good air entry bilaterally, clear to auscultation without wheeze or crackles CARDIOVASCULAR: Normal S1,S2 , no edema SKIN: Warm, dry      Assessment & Plan:    See Problem List for Assessment and Plan of chronic medical problems.

## 2021-05-06 ENCOUNTER — Ambulatory Visit (INDEPENDENT_AMBULATORY_CARE_PROVIDER_SITE_OTHER): Payer: BC Managed Care – PPO | Admitting: Internal Medicine

## 2021-05-06 ENCOUNTER — Other Ambulatory Visit: Payer: Self-pay

## 2021-05-06 ENCOUNTER — Encounter: Payer: Self-pay | Admitting: Internal Medicine

## 2021-05-06 DIAGNOSIS — H1033 Unspecified acute conjunctivitis, bilateral: Secondary | ICD-10-CM | POA: Diagnosis not present

## 2021-05-06 DIAGNOSIS — I10 Essential (primary) hypertension: Secondary | ICD-10-CM | POA: Diagnosis not present

## 2021-05-06 DIAGNOSIS — J069 Acute upper respiratory infection, unspecified: Secondary | ICD-10-CM | POA: Diagnosis not present

## 2021-05-06 MED ORDER — DOXYCYCLINE HYCLATE 100 MG PO TABS: 100.0000 mg | ORAL_TABLET | Freq: Two times a day (BID) | ORAL | 0 refills | Status: AC

## 2021-05-06 MED ORDER — POLYMYXIN B-TRIMETHOPRIM 10000-0.1 UNIT/ML-% OP SOLN: 1.0000 [drp] | OPHTHALMIC | 0 refills | Status: DC

## 2021-05-06 NOTE — Assessment & Plan Note (Signed)
Acute Started over one week ago and symptoms getting worse Associated with bacterial conjunctivitis Concern for bacterial cause Start doxycycline 100 mg bid x 10 days otc cold meds Rest, fluids Call if no improvement/ questions

## 2021-05-06 NOTE — Assessment & Plan Note (Signed)
Chronic BP slightly elevated here today, but she is sick Has f/u in about 6 weeks with me Continue current meds - amlodipine 5 mg daily, losartan 25 mg daily

## 2021-05-06 NOTE — Assessment & Plan Note (Signed)
Acute Lasting > 1 week so unlikely viral Active, erythema, discharge and mild eyelid swelling Start polytrim eye drops b/l If no improvement advised to see eye doctor

## 2021-06-16 NOTE — Progress Notes (Signed)
Subjective:    Patient ID: Katherine Lawrence, female    DOB: [birth year], 66 y.o.   MRN: [id number]  This visit occurred during the SARS-CoV-2 public health emergency.  Safety protocols were in place, including screening questions prior to the visit, additional usage of staff PPE, and extensive cleaning of exam room while observing appropriate contact time as indicated for disinfecting solutions.     HPI The patient is here for follow up of their chronic medical problems, including htn, DM, hld, anxiety, depression, neurocognitive d/o  She is having bilateral knee pain-left knee is chronic and now the right knee is getting worse.  She has seen orthopedics in the past, has not followed up with them.  She states some blurry vision.  She has not schedule an appointment with her ophthalmologist yet.  Medications and allergies reviewed with patient and updated if appropriate.  Patient Active Problem List   Diagnosis Date Noted   Acute bacterial conjunctivitis of both eyes 05/06/2021   URI (upper respiratory infection) 05/06/2021   Dysrhythmia 03/12/2021   Generalized anxiety disorder 03/12/2021   Mild neurocognitive disorder due to Alzheimer's disease, possible 03/12/2021   Constipation 02/14/2021   Eye irritation 12/06/2020   LLQ pain 12/06/2020   Neck pain 04/13/2020   Snoring 12/07/2019   History of CVA (cerebrovascular accident) 12/06/2019   Thumb pain 06/01/2019   Greater trochanteric pain syndrome 02/18/2018   Glaucoma 07/14/2017   Osteoarthritis of left hip 05/10/2017   Type 2 diabetes mellitus without complication, without long-term current use of insulin 04/13/2015   Obesity 03/21/2015   S/P total knee replacement using cement 02/14/2014   Osteoarthritis of left knee    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 For cholesterol 90 tablet 1   blood glucose meter kit and supplies KIT Dispense based on patient and insurance preference. Use up to four times daily as directed. (FOR E11.9). 1 each 0   docusate sodium (COLACE) 100 MG capsule Take 1 capsule (100 mg total) by mouth 2 (two) times daily. 60 capsule 5   donepezil (ARICEPT) 10 MG tablet Take half tablet (5 mg) daily for 2 weeks, then increase to the full tablet at 10 mg daily 30 tablet 11   losartan (COZAAR) 25 MG tablet TAKE 1 TABLET(25 MG) BY MOUTH DAILY for blood pressure 90 tablet 1   metFORMIN (GLUCOPHAGE) 500 MG tablet Take 1 tablet (500 mg total) by mouth 2 (two) times daily with a meal. For diabetes 180 tablet 1   polyethylene glycol powder (GLYCOLAX/MIRALAX) 17 GM/SCOOP powder Take 17 g by mouth 2 (two) times daily as needed for severe constipation. 3350 g 1   trimethoprim-polymyxin b (POLYTRIM) ophthalmic solution Place 1 drop into both eyes every 4 (four) hours. Use for one week 10 mL 0   No current facility-administered medications on file prior to visit.    Past Medical History:  Diagnosis Date  Allergy    CIN I (cervical intraepithelial neoplasia I) 1996   cryo...  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   Osteoarthritis of left hip 05/10/2017   Osteoarthritis of left knee    Pending L TKR fall 2015 - [facility]   Palpitations 08/05/2019   Pneumonia 06/2003   Postsurgical menopause    Pure hypercholesterolemia 04/03/2008   S/P total knee replacement using cement 02/14/2014   Type 2 diabetes mellitus without complication, without long-term current use of insulin 04/13/2015   Dr Katy Fitch    Past Surgical History:  Procedure Laterality Date   ABDOMINAL HYSTERECTOMY  08/22/2011   CERVICAL CONE BIOPSY  1996   CESAREAN SECTION     x1   COLONOSCOPY W/ POLYPECTOMY  12/2008   x3   DILATION AND CURETTAGE OF UTERUS     KNEE ARTHROTOMY Left 08/15/2019   Procedure: Left knee arthrotomy; scar excision;  Surgeon: Gaynelle Arabian, MD;  Location: [facility];  Service: Orthopedics;  Laterality: Left;  64min   TONSILLECTOMY     TOTAL HIP ARTHROPLASTY Right 2008   TOTAL HIP ARTHROPLASTY Left 05/20/2017   Procedure: LEFT TOTAL HIP ARTHROPLASTY ANTERIOR APPROACH;  Surgeon: Gaynelle Arabian, MD;  Location: [facility];  Service: Orthopedics;  Laterality: Left;   TOTAL KNEE ARTHROPLASTY Left 02/14/2014   DR Durward Fortes   TOTAL KNEE ARTHROPLASTY Left 02/14/2014   Procedure: TOTAL KNEE ARTHROPLASTY;  Surgeon: Garald Balding, MD;  Location: [facility];  Service: Orthopedics;  Laterality: Left;   TOTAL KNEE REVISION Left 07/14/2018   Procedure: TOTAL KNEE REVISION;  Surgeon: Gaynelle Arabian, MD;  Location: [facility];  Service: Orthopedics;  Laterality: Left;  Adductor Block    Social History   Socioeconomic History   Marital status: Married    Spouse name: Not on file   Number of  children: 2   Years of education: 14   Highest education level: Some college, no degree  Occupational History   Occupation: Retired    Comment: [employer]  Tobacco Use   Smoking status: Former    Packs/day: 0.25    Years: 1.00    Pack years: 0.25    Types: Cigarettes    Quit date: 07/08/2010    Years since quitting: 10.9   Smokeless tobacco: Never  Vaping Use   Vaping Use: Never used  Substance and Sexual Activity   Alcohol use: Not Currently    Comment: Rare   Drug use: No   Sexual activity: Not Currently    Comment: 1st intercourse- 14, partners- 64, married- 59 yrs   Other Topics Concern   Not on file  Social History Narrative   Lives with spouse; grown kids   Pt is also a Ship broker.   Regular exercise-yes  Social Determinants of Health   Financial Resource Strain: Not on file  Food Insecurity: Not on file  Transportation Needs: Not on file  Physical Activity: Not on file  Stress: Not on file  Social Connections: Not on file    Family History  Problem Relation Age of Onset   Pancreatic cancer Mother 52   Hypertension Father    Heart disease Father    Diabetes Son    Colon cancer Neg Hx    Esophageal cancer Neg Hx    Stomach cancer Neg Hx    Rectal cancer Neg Hx     Review of Systems  Constitutional:  Negative for fever.  Eyes:  Positive for visual disturbance.  Respiratory:  Positive for cough. Negative for shortness of breath and wheezing.   Cardiovascular:  Negative for chest pain, palpitations and leg swelling.  Neurological:  Positive for light-headedness (occ - transient - ? dizziness). Negative for headaches.      Objective:   Vitals:   06/18/21 1118  BP: (!) 144/82  Pulse: 83  Temp: 98.8 F (37.1 C)  SpO2: 97%   BP Readings from Last 3 Encounters:  06/18/21 (!) 144/82  05/06/21 (!) 146/86  03/26/21 (!) 183/112   Wt Readings from Last 3 Encounters:  06/18/21 207 lb (93.9 kg)  05/06/21 202 lb (91.6 kg)  03/26/21  204 lb (92.5 kg)   Body mass index is 35.53 kg/m.   Physical Exam    Constitutional: Appears well-developed and well-nourished. No distress.  HENT:  Head: Normocephalic and atraumatic.  Neck: Neck supple. No tracheal deviation present. No thyromegaly present.  No cervical lymphadenopathy Cardiovascular: Normal rate, regular rhythm and normal heart sounds.  No murmur heard. No carotid bruit .  No edema Pulmonary/Chest: Effort normal and breath sounds normal. No respiratory distress. No has no wheezes. No rales.  Skin: Skin is warm and dry. Not diaphoretic.  Psychiatric: Normal mood and affect. Behavior is normal.      Assessment & Plan:    See Problem List for Assessment and Plan of chronic medical problems.

## 2021-06-16 NOTE — Patient Instructions (Addendum)
° °  Call Dr. Wynelle Link and Dr Katy Fitch.    Blood work was ordered.     Medications changes include :  none     Please followup in 6 months

## 2021-06-18 ENCOUNTER — Other Ambulatory Visit: Payer: Self-pay

## 2021-06-18 ENCOUNTER — Encounter: Payer: Self-pay | Admitting: Internal Medicine

## 2021-06-18 ENCOUNTER — Ambulatory Visit (INDEPENDENT_AMBULATORY_CARE_PROVIDER_SITE_OTHER): Payer: BC Managed Care – PPO | Admitting: Internal Medicine

## 2021-06-18 VITALS — BP 144/82 | HR 83 | Temp 98.8°F | Ht 64.0 in | Wt 207.0 lb

## 2021-06-18 DIAGNOSIS — E7800 Pure hypercholesterolemia, unspecified: Secondary | ICD-10-CM

## 2021-06-18 DIAGNOSIS — F0670 Mild neurocognitive disorder due to known physiological condition without behavioral disturbance: Secondary | ICD-10-CM

## 2021-06-18 DIAGNOSIS — I10 Essential (primary) hypertension: Secondary | ICD-10-CM | POA: Diagnosis not present

## 2021-06-18 DIAGNOSIS — E119 Type 2 diabetes mellitus without complications: Secondary | ICD-10-CM | POA: Diagnosis not present

## 2021-06-18 DIAGNOSIS — G309 Alzheimer's disease, unspecified: Secondary | ICD-10-CM | POA: Diagnosis not present

## 2021-06-18 DIAGNOSIS — F411 Generalized anxiety disorder: Secondary | ICD-10-CM

## 2021-06-18 DIAGNOSIS — F325 Major depressive disorder, single episode, in full remission: Secondary | ICD-10-CM

## 2021-06-18 NOTE — Assessment & Plan Note (Signed)
Chronic Advised making an eye appointment especially since she is having blurry vision Check A1c Encouraged her to be as active as possible, but she is very limited because of her bilateral knee arthritis Continue metformin 500 mg twice daily

## 2021-06-18 NOTE — Assessment & Plan Note (Signed)
Chronic Check lipid panel  Continue atorvastatin 40 mg daily Regular exercise and healthy diet encouraged  

## 2021-06-18 NOTE — Assessment & Plan Note (Addendum)
Chronic Blood pressure fairly controlled-she does state occasional lightheadedness so I will not adjust any medication for now CMP Continue amlodipine 5 mg daily, losartan 25 mg daily

## 2021-06-18 NOTE — Assessment & Plan Note (Signed)
Chronic Was diagnosed by neuropsychology Has seen neurology as well Continue Aricept 10 mg daily

## 2021-06-19 ENCOUNTER — Other Ambulatory Visit (INDEPENDENT_AMBULATORY_CARE_PROVIDER_SITE_OTHER): Payer: BC Managed Care – PPO

## 2021-06-19 DIAGNOSIS — E7800 Pure hypercholesterolemia, unspecified: Secondary | ICD-10-CM

## 2021-06-19 DIAGNOSIS — E119 Type 2 diabetes mellitus without complications: Secondary | ICD-10-CM | POA: Diagnosis not present

## 2021-06-19 DIAGNOSIS — I10 Essential (primary) hypertension: Secondary | ICD-10-CM | POA: Diagnosis not present

## 2021-06-19 LAB — CBC WITH DIFFERENTIAL/PLATELET
Basophils Absolute: 0 10*3/uL (ref 0.0–0.1)
Basophils Relative: 0.5 % (ref 0.0–3.0)
Eosinophils Absolute: 0.1 10*3/uL (ref 0.0–0.7)
Eosinophils Relative: 1.3 % (ref 0.0–5.0)
HCT: 46.4 % — ABNORMAL HIGH (ref 36.0–46.0)
Hemoglobin: 14.9 g/dL (ref 12.0–15.0)
Lymphocytes Relative: 20.5 % (ref 12.0–46.0)
Lymphs Abs: 1.5 10*3/uL (ref 0.7–4.0)
MCHC: 32.1 g/dL (ref 30.0–36.0)
MCV: 88.6 fl (ref 78.0–100.0)
Monocytes Absolute: 0.3 10*3/uL (ref 0.1–1.0)
Monocytes Relative: 4.6 % (ref 3.0–12.0)
Neutro Abs: 5.2 10*3/uL (ref 1.4–7.7)
Neutrophils Relative %: 73.1 % (ref 43.0–77.0)
Platelets: 250 10*3/uL (ref 150.0–400.0)
RBC: 5.23 Mil/uL — ABNORMAL HIGH (ref 3.87–5.11)
RDW: 15.5 % (ref 11.5–15.5)
WBC: 7.2 10*3/uL (ref 4.0–10.5)

## 2021-06-19 LAB — LIPID PANEL
Cholesterol: 260 mg/dL — ABNORMAL HIGH (ref 0–200)
HDL: 46.9 mg/dL (ref >39.00)
LDL Cholesterol: 188 mg/dL — ABNORMAL HIGH (ref 0–99)
NonHDL: 212.82
Total CHOL/HDL Ratio: 6
Triglycerides: 126 mg/dL (ref 0.0–149.0)
VLDL: 25.2 mg/dL (ref 0.0–40.0)

## 2021-06-19 LAB — COMPREHENSIVE METABOLIC PANEL
ALT: 13 U/L (ref 0–35)
AST: 14 U/L (ref 0–37)
Albumin: 4.7 g/dL (ref 3.5–5.2)
Alkaline Phosphatase: 59 U/L (ref 39–117)
BUN: 13 mg/dL (ref 6–23)
CO2: 30 mEq/L (ref 19–32)
Calcium: 9.9 mg/dL (ref 8.4–10.5)
Chloride: 102 mEq/L (ref 96–112)
Creatinine, Ser: 0.71 mg/dL (ref 0.40–1.20)
GFR: 90.53 mL/min (ref >60.00)
Glucose, Bld: 114 mg/dL — ABNORMAL HIGH (ref 70–99)
Potassium: 3.9 mEq/L (ref 3.5–5.1)
Sodium: 142 mEq/L (ref 135–145)
Total Bilirubin: 0.8 mg/dL (ref 0.2–1.2)
Total Protein: 7.6 g/dL (ref 6.0–8.3)

## 2021-06-19 LAB — HEMOGLOBIN A1C: Hgb A1c MFr Bld: 6.4 % (ref 4.6–6.5)

## 2021-06-26 ENCOUNTER — Ambulatory Visit: Payer: BC Managed Care – PPO | Admitting: Physician Assistant

## 2021-07-10 NOTE — Progress Notes (Signed)
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  Patient on donepezil 10 mg daily, tolerating well.  Situational depression is noted, patient not interested in medication regimen, but she is on cognitive behavioral therapy.   Recommendations:   Discussed safety both in and out of the home.  Discussed the importance of regular daily schedule with inclusion of crossword puzzles to maintain brain function.  Continue to monitor mood by PCP Agree with CBT, she was encouraged to pursue to help her emotionally in view of situational depression Stay active at least 30 minutes at least 3 times a week.  Naps should be scheduled and should be no longer than 60 minutes and should not occur after 2 PM.  Mediterranean diet is recommended  Continue donepezil 10 mg daily Side effects were discussed Repeat Neuropsychological evaluation in 14-20 months  Follow up in 6 months.   Case discussed with Dr. Delice Lesch who agrees with the plan     Subjective:   ED visits since last seen: none  Hospital admissions: none  Nyesha DANNIE WOOLEN is a 66 y.o. 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 This patient is accompanied in the office by her husband who supplements the history.  Previous records as well as any outside records available were reviewed prior to todays visit. She was last seen on 03/26/21 after Neuropsychological evaluation on March 12, 2021.  Currently she is on donepezil 10 mg daily, tolerating well.  Since her last visit patient reports that her memory is about the same.  She continues to repeat herself, and have memory lapses in which she does not recall a conversation or inability to find  the right word, provoking some level of depression on her.  She may forget sometimes to feed her dogs, so her husband monitors this.  She  continues to report decreased attention and concentration as before.  At home, she spends significant amount of time watching TV.  She admits to feeling loneliness and becomes tearful when talks about her children, as she wishes that she would see them more often.  She has some friends, also with cognitive difficulties, and shares her thoughts and feelings with them "they can understand me well ".  However, she admits to not participating in activities outside or going to church as before.  She is interested in cognitive behavioral therapy, not interested in starting any new antidepressants. She sleeps well, denies vivid dreams, sleepwalking or hallucinations or paranoia.  She denies leaving objects in unusual places.  She is independent of bathing and dressing, and she is still good at taking her medications.  Her appetite is good, denies trouble swallowing.  She continues to cook, and has to remind herself to leave the stove off.  She uses a cane to ambulate to prevent falls.  She is in the process of undergoing left knee replacement by orthopedics due to arthritis.  She is not very active. She denies any headaches, dizziness, vision changes, focal numbness, tingling or weakness.  She continues to drive short distances.   Neuropsychological evaluation 03/12/21 :  results suggested severe impairment surrounding all aspects of verbal learning and memory. Additional impairments were exhibited across confrontation naming, verbal fluency (semantic worse than phonemic), and executive functioning (including a  task assessing safety and judgment). Regarding etiology, despite Ms. Howdeshell's young age, I unfortunately do have concerns surrounding Alzheimer's disease. She demonstrated prominent verbal memory dysfunction with evidence to suggest a deficit with information storage. She also  demonstrated impairments in confrontation naming, semantic fluency, and executive functioning. Overall, this does represent a fairly classic pattern of dysfunction in earlier stages of this disease process. 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 However, upon review of these images, possible areas represented do not involve the hippocampus or other primary memory centers of the brain. As such, while there may be a mild vascular contribution to her current presentation, a vascular etiology is unlikely to fully account for memory impairment or other areas of dysfunction.  [facility] MRI on 04/23/2015 revealed age-advanced cerebral atrophy, most notable in the frontal regions. Brain MRI on 03/23/2019 revealed mild frontal atrophy bilaterally, as well as small deep white matter infarcts in the left hemisphere.  Negative for acute infarct. Mild chronic ischemia left cerebral white matter has developed since 2016. Mild frontal atrophy.    History of Present Illness 04/21/2019:  The patient had a telephone visit on 04/21/2019. She was last seen in the neurology clinic 10 months ago for myalgias. EMG/NCV did not show any evidence of myopathy, bloodwork no evidence of inflammation. She was seen in 2016 for memory changes. MRI brain in 04/2015 unremarkable. She reports that since her last visit, she is noticing more cognitive changes. She has noticed more hesitation during conversations, she would have to stop and think of what she was going to say. She was laid off in October and offered early retirement, she noticed toward the end that she had to write down everything she was going to say if she had to speak on conference calls. She would go blank when called on to discuss something. She has gotten lost driving, unable to recall how to get to places she has been to for years. She has left the stove on. She infrequently forgets medications. She denies missing bill payments. Sleep and mood are  good. She had a knee replacement with continued pain. She denies any headaches, dizziness, vision changes, focal numbness/tingling/weakness. She is not having as much myalgias as before. She had a repeat MRI brain without contrast done 03/2019 did not show any acute changes. 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    Laboratory Data: Recent Labs       Lab Results  Component Value Date    TSH 3.05 03/01/2019      Recent Labs       Lab Results  Component Value Date    VITAMINB12 895 03/01/2019        Recent Labs       Lab Results  Component Value Date    CHOL 205 (H) 03/01/2019    HDL 49.10 03/01/2019    LDLCALC 133 (H) 03/01/2019    LDLDIRECT 157.6 11/16/2012    TRIG 113.0 03/01/2019    CHOLHDL 4 03/01/2019        History on initial Assessment 06/21/2018: This is a pleasant 66 year old right-handed woman who I had previously seen in October 2016 for evaluation of memory changes and poor concentration. She had an MRI brain which was unremarkable. She presents today for evaluation of different symptoms of muscle pain, neck pain, and poor balance. She reports that the muscle pains started right after her hip  replacement in 2012 or so. After she went home and started doing exercises, she started noticing pain in her arms. On review of records, she had been reporting severe muscle pains in her arms > legs since 2012, repeat CK levels have been normal. She describes pain as an ache that has progressively worsened over the years that she cannot lift her arms up or put anything away in overhead cabinets. She describes weakness and pain in her arms, shoulders, and neck. She denies back pain but has the pain in her legs, causing difficulties climbing stairs. She does have knee issues as well and uses a cane, with left knee replacement surgery scheduled at the end of this month. She does not take Advil for regular pain  or when she can walk, taking it only when pain is really bad or  before she cleans the house and would definitely need it after cleaning, taking 5 tablets at one time 2-3 times a week. She denies any numbness or tingling in her extremities, but has noticed burning on the base of her left thumb, making it difficult to move. She has muscle cramps in her legs, worse at night. She denies any family history of similar symptoms. No prior falls or accidents.    She denies any headaches, dysarthria/dysphagia, bowel/bladder dysfunction. Vision is occasionally blurred, no diplopia. She reports dizziness described as a sensation of unsteadiness like she will fall. She continues to note memory issues. Her husband teases her that she repeats herself. She worked for [employer] for 36 years and all of a sudden could not remember what to do. She occasionally gets lost driving, she has to think of how to get to familiar places. She missed a bill payment 2 months ago. She is pretty good remembering medications. Mood is good, she has noticed a difference when she forgets her citalopram.     PREVIOUS MEDICATIONS:   CURRENT MEDICATIONS:  Outpatient Encounter Medications as of 07/11/2021  Medication Sig   ACCU-CHEK GUIDE test strip USE UP TO FOUR TIMES DAILY   Accu-Chek Softclix Lancets lancets SMARTSIG:Topical 1 to 4 Times Daily   amLODipine (NORVASC) 5 MG tablet TAKE 1 TABLET(5 MG) BY MOUTH DAILY for blood pressure   atorvastatin (LIPITOR) 40 MG tablet Take 1 tablet (40 mg total) by mouth daily. For cholesterol   blood glucose meter kit and supplies KIT Dispense based on patient and insurance preference. Use up to four times daily as directed. (FOR E11.9).   docusate sodium (COLACE) 100 MG capsule Take 1 capsule (100 mg total) by mouth 2 (two) times daily.   losartan (COZAAR) 25 MG tablet TAKE 1 TABLET(25 MG) BY MOUTH DAILY for blood pressure   metFORMIN (GLUCOPHAGE) 500 MG tablet Take 1 tablet (500 mg total) by  mouth 2 (two) times daily with a meal. For diabetes   polyethylene glycol powder (GLYCOLAX/MIRALAX) 17 GM/SCOOP powder Take 17 g by mouth 2 (two) times daily as needed for severe constipation.   [DISCONTINUED] donepezil (ARICEPT) 10 MG tablet Take half tablet (5 mg) daily for 2 weeks, then increase to the full tablet at 10 mg daily   donepezil (ARICEPT) 10 MG tablet Tak  1 tab 10 mg daily   No facility-administered encounter medications on file as of 07/11/2021.     Objective:     PHYSICAL EXAMINATION:    VITALS:   Vitals:   07/11/21 0909  BP: (!) 156/72  Pulse: 84  SpO2: 97%  Weight: 204 lb (92.5 kg)  Height: _0  (1.626 m)     GEN:  The patient appears stated age and is in NAD. HEENT:  Normocephalic, atraumatic.   Neurological examination:  General: NAD, well-groomed, appears stated age. Orientation: The patient is alert. Oriented to person, place and time Cranial nerves: There is good facial symmetry.The speech is not fluent, but clear. Mild aphasia noted, without dysarthria. Fund of knowledge is reduced. Recent and remote memory are impaired. Attention and concentration are reduced.  Able to name objects and repeat phrases.  Hearing is intact to conversational tone.    Sensation: Sensation is intact to light touch throughout Motor: Strength is at least antigravity x4. Tremors: none  DTR's 2/4 in [place] Cognitive Assessment  06/21/2018 04/13/2015  Visuospatial/ Executive (0/5) 4 3  Naming (0/3) 3 3  Attention: Read list of digits (0/2) 2 2  Attention: Read list of letters (0/1) 1 1  Attention: Serial 7 subtraction starting at 100 (0/3) 3 3  Language: Repeat phrase (0/2) 2 2  Language : Fluency (0/1) 0 0  Abstraction (0/2) 2 2  Delayed Recall (0/5) 0 2  Orientation (0/6) 6 6  Total 23 24  Adjusted Score (based on education) - 24   MMSE - Mini Mental State Exam 07/11/2021  Orientation to time 4  Orientation to Place 5  Registration 3  Attention/  Calculation 5  Recall 1  Language- name 2 objects 2  Language- repeat 1  Language- follow 3 step command 3  Language- read & follow direction 1  Write a sentence 1  Copy design 0  Total score [place] Mental Exam 04/26/2019  Weekday Correct 1  Current year 1  What state are we in? 1  Amount spent 1  Amount left 2  # of Animals 1  5 objects recall 2  Number series 2  Hour markers 0  Time correct 0  Placed X in triangle correctly 0  Largest Figure 0  Name of female 2  Date back to work 0  Type of work 2  State she lived in 2  Total score 17       Movement examination: Tone: There is normal tone in the UE/LE Abnormal movements:  no tremor.  No myoclonus.  No asterixis.   Coordination:  There is no decremation with RAM's. Normal finger to nose  Gait and Station: The patient has no difficulty arising out of a deep-seated chair without the use of the hands. The patient's stride length is good.  Gait is cautious and narrow, uses a cane to ambulate.      Total time spent on today's visit was  30  minutes, including both face-to-face time and nonface-to-face time. Time included that spent on review of records (prior notes available to me/labs/imaging if pertinent), discussing treatment and goals, answering patient's questions and coordinating care.  Cc:  Binnie Rail, MD Sharene Butters, PA-C

## 2021-07-11 ENCOUNTER — Encounter: Payer: Self-pay | Admitting: Physician Assistant

## 2021-07-11 ENCOUNTER — Other Ambulatory Visit: Payer: Self-pay

## 2021-07-11 ENCOUNTER — Ambulatory Visit (INDEPENDENT_AMBULATORY_CARE_PROVIDER_SITE_OTHER): Payer: BC Managed Care – PPO | Admitting: Physician Assistant

## 2021-07-11 VITALS — BP 156/72 | HR 84 | Ht 64.0 in | Wt 204.0 lb

## 2021-07-11 DIAGNOSIS — F0670 Mild neurocognitive disorder due to known physiological condition without behavioral disturbance: Secondary | ICD-10-CM

## 2021-07-11 DIAGNOSIS — G309 Alzheimer's disease, unspecified: Secondary | ICD-10-CM | POA: Diagnosis not present

## 2021-07-11 MED ORDER — DONEPEZIL HCL 10 MG PO TABS: ORAL_TABLET | ORAL | 11 refills | Status: DC

## 2021-07-11 NOTE — Patient Instructions (Addendum)
It was a pleasure to see you today at our office.   Recommendations:  Meds: Follow up in 6 months Continue Donepezil 10 mg daily.  Side effects discussed  Consider increasing activities outside from home Consider CBT  Clinicaltrials.gov website   RECOMMENDATIONS FOR ALL PATIENTS WITH MEMORY PROBLEMS: 1. Continue to exercise (Recommend 30 minutes of walking everyday, or 3 hours every week) 2. Increase social interactions - continue going to [place] and enjoy social gatherings with friends and family 3. Eat healthy, avoid fried foods and eat more fruits and vegetables 4. Maintain adequate blood pressure, blood sugar, and blood cholesterol level. Reducing the risk of stroke and cardiovascular disease also helps promoting better memory. 5. Avoid stressful situations. Live a simple life and avoid aggravations. Organize your time and prepare for the next day in anticipation. 6. Sleep well, avoid any interruptions of sleep and avoid any distractions in the bedroom that may interfere with adequate sleep quality 7. Avoid sugar, avoid sweets as there is a strong link between excessive sugar intake, diabetes, and cognitive impairment We discussed the Mediterranean diet, which has been shown to help patients reduce the risk of progressive memory disorders and reduces cardiovascular risk. This includes eating fish, eat fruits and green leafy vegetables, nuts like almonds and hazelnuts, walnuts, and also use olive oil. Avoid fast foods and fried foods as much as possible. Avoid sweets and sugar as sugar use has been linked to worsening of memory function.  There is always a concern of gradual progression of memory problems. If this is the case, then we may need to adjust level of care according to patient needs. Support, both to the patient and caregiver, should then be put into place.    The [employer] is here all day, every day for people facing Alzheimers disease through our free 24/7  Helpline: 4582650886. The Helpline provides reliable information and support to all those who need assistance, such as individuals living with memory loss, Alzheimer's or other dementia, caregivers, health care professionals and the public.  Our highly trained and knowledgeable staff can help you with: Understanding memory loss, dementia and Alzheimer's  Medications and other treatment options  General information about aging and brain health  Skills to provide quality care and to find the best care from professionals  Legal, financial and living-arrangement decisions Our Helpline also features: Confidential care consultation provided by master's level clinicians who can help with decision-making support, crisis assistance and education on issues families face every day  Help in a caller's preferred language using our translation service that features more than 200 languages and dialects  Referrals to local community programs, services and ongoing support     FALL PRECAUTIONS: Be cautious when walking. Scan the area for obstacles that may increase the risk of trips and falls. When getting up in the mornings, sit up at the edge of the bed for a few minutes before getting out of bed. Consider elevating the bed at the head end to avoid drop of blood pressure when getting up. Walk always in a well-lit room (use night lights in the walls). Avoid area rugs or power cords from appliances in the middle of the walkways. Use a walker or a cane if necessary and consider physical therapy for balance exercise. Get your eyesight checked regularly.  FINANCIAL OVERSIGHT: Supervision, especially oversight when making financial decisions or transactions is also recommended.  HOME SAFETY: Consider the safety of the kitchen when operating appliances like stoves, microwave oven, and blender.  Consider having supervision and share cooking responsibilities until no longer able to participate in those. Accidents with  firearms and other hazards in the house should be identified and addressed as well.   ABILITY TO BE LEFT ALONE: If patient is unable to contact 911 operator, consider using LifeLine, or when the need is there, arrange for someone to stay with patients. Smoking is a fire hazard, consider supervision or cessation. Risk of wandering should be assessed by caregiver and if detected at any point, supervision and safe proof recommendations should be instituted.  MEDICATION SUPERVISION: Inability to self-administer medication needs to be constantly addressed. Implement a mechanism to ensure safe administration of the medications.   DRIVING: Regarding driving, in patients with progressive memory problems, driving will be impaired. We advise to have someone else do the driving if trouble finding directions or if minor accidents are reported. Independent driving assessment is available to determine safety of driving.   If you are interested in the driving assessment, you can contact the following:  [employer] in [place]  [place] [phone] or [phone]      [place] refers to food and lifestyle choices that are based on the traditions of countries located on the [employer]. This way of eating has been shown to help prevent certain conditions and improve outcomes for people who have chronic diseases, like kidney disease and heart disease. What are tips for following this plan? Lifestyle  Cook and eat meals together with your family, when possible. Drink enough fluid to keep your urine clear or pale yellow. Be physically active every day. This includes: Aerobic exercise like running or swimming. Leisure activities like gardening, walking, or housework. Get 7-8 hours of sleep each night. If recommended by your health care provider,  drink red wine in moderation. This means 1 glass a day for nonpregnant women and 2 glasses a day for men. A glass of wine equals 5 oz (150 mL). Reading food labels  Check the serving size of packaged foods. For foods such as rice and pasta, the serving size refers to the amount of cooked product, not dry. Check the total fat in packaged foods. Avoid foods that have saturated fat or trans fats. Check the ingredients list for added sugars, such as corn syrup. Shopping  At the grocery store, buy most of your food from the areas near the walls of the store. This includes: Fresh fruits and vegetables (produce). Grains, beans, nuts, and seeds. Some of these may be available in unpackaged forms or large amounts (in bulk). Fresh seafood. Poultry and eggs. Low-fat dairy products. Buy whole ingredients instead of prepackaged foods. Buy fresh fruits and vegetables in-season from local farmers markets. Buy frozen fruits and vegetables in resealable bags. If you do not have access to quality fresh seafood, buy precooked frozen shrimp or canned fish, such as tuna, salmon, or sardines. Buy small amounts of raw or cooked vegetables, salads, or olives from the deli or salad bar at your store. Stock your pantry so you always have certain foods on hand, such as olive oil, canned tuna, canned tomatoes, rice, pasta, and beans. Cooking  Cook foods with extra-virgin olive oil instead of using butter or other vegetable oils. Have meat as a side dish, and have vegetables or grains as your main dish. This means having meat in small portions or adding small amounts of meat to foods like pasta  or stew. Use beans or vegetables instead of meat in common dishes like chili or lasagna. Experiment with different cooking methods. Try roasting or broiling vegetables instead of steaming or sauteing them. Add frozen vegetables to soups, stews, pasta, or rice. Add nuts or seeds for added healthy fat at each meal. You can add  these to yogurt, salads, or vegetable dishes. Marinate fish or vegetables using olive oil, lemon juice, garlic, and fresh herbs. Meal planning  Plan to eat 1 vegetarian meal one day each week. Try to work up to 2 vegetarian meals, if possible. Eat seafood 2 or more times a week. Have healthy snacks readily available, such as: Vegetable sticks with hummus. Greek yogurt. Fruit and nut trail mix. Eat balanced meals throughout the week. This includes: Fruit: 2-3 servings a day Vegetables: 4-5 servings a day Low-fat dairy: 2 servings a day Fish, poultry, or lean meat: 1 serving a day Beans and legumes: 2 or more servings a week Nuts and seeds: 1-2 servings a day Whole grains: 6-8 servings a day Extra-virgin olive oil: 3-4 servings a day Limit red meat and sweets to only a few servings a month What are my food choices? Mediterranean diet Recommended Grains: Whole-grain pasta. Brown rice. Bulgar wheat. Polenta. Couscous. Whole-wheat bread. [place]. Vegetables: Artichokes. Beets. Broccoli. Cabbage. Carrots. Eggplant. Green beans. Chard. Kale. Spinach. Onions. Leeks. Peas. Squash. Tomatoes. Peppers. Radishes. Fruits: Apples. Apricots. Avocado. Berries. Bananas. Cherries. Dates. Figs. Grapes. Lemons. Melon. Oranges. Peaches. Plums. Pomegranate. Meats and other protein foods: Beans. Almonds. Sunflower seeds. Pine nuts. Peanuts. [place]. Salmon. Scallops. Shrimp. [place]. Tilapia. Clams. Oysters. Eggs. Dairy: Low-fat milk. Cheese. Greek yogurt. Beverages: Water. Red wine. Herbal tea. Fats and oils: Extra virgin olive oil. Avocado oil. Grape seed oil. Sweets and desserts: [place] yogurt with honey. Baked apples. Poached pears. Trail mix. Seasoning and other foods: Basil. Cilantro. Coriander. Cumin. Mint. Parsley. Sage. Rosemary. Tarragon. Garlic. Oregano. Thyme. Pepper. Balsalmic vinegar. Tahini. Hummus. Tomato sauce. Olives. Mushrooms. Limit these Grains: Prepackaged pasta or rice dishes.  Prepackaged cereal with added sugar. Vegetables: Deep fried potatoes (french fries). Fruits: Fruit canned in syrup. Meats and other protein foods: Beef. Pork. Lamb. Poultry with skin. Hot dogs. [place] Salines. Dairy: Ice cream. Sour cream. Whole milk. Beverages: Juice. Sugar-sweetened soft drinks. Beer. Liquor and spirits. Fats and oils: Butter. Canola oil. Vegetable oil. Beef fat (tallow). Lard. Sweets and desserts: Cookies. Cakes. Pies. Candy. Seasoning and other foods: Mayonnaise. Premade sauces and marinades. The items listed may not be a complete list. Talk with your dietitian about what dietary choices are right for you. Summary The Mediterranean diet includes both food and lifestyle choices. Eat a variety of fresh fruits and vegetables, beans, nuts, seeds, and whole grains. Limit the amount of red meat and sweets that you eat. Talk with your health care provider about whether it is safe for you to drink red wine in moderation. This means 1 glass a day for nonpregnant women and 2 glasses a day for men. A glass of wine equals 5 oz (150 mL). This information is not intended to replace advice given to you by your health care provider. Make sure you discuss any questions you have with your health care provider. Document Released: 01/24/2016 Document Revised: 02/26/2016 Document Reviewed: 01/24/2016 Elsevier Interactive Patient Education  2017 [employer].

## 2021-08-15 DIAGNOSIS — H2513 Age-related nuclear cataract, bilateral: Secondary | ICD-10-CM | POA: Diagnosis not present

## 2021-08-15 DIAGNOSIS — H40053 Ocular hypertension, bilateral: Secondary | ICD-10-CM | POA: Diagnosis not present

## 2021-08-15 DIAGNOSIS — H04123 Dry eye syndrome of bilateral lacrimal glands: Secondary | ICD-10-CM | POA: Diagnosis not present

## 2021-08-15 DIAGNOSIS — E119 Type 2 diabetes mellitus without complications: Secondary | ICD-10-CM | POA: Diagnosis not present

## 2021-08-19 NOTE — Telephone Encounter (Signed)
Note not needed 

## 2021-09-05 ENCOUNTER — Encounter: Payer: Self-pay | Admitting: Internal Medicine

## 2021-09-05 NOTE — Progress Notes (Signed)
duplicate

## 2021-10-07 LAB — HM DIABETES EYE EXAM

## 2021-11-10 DIAGNOSIS — R42 Dizziness and giddiness: Secondary | ICD-10-CM | POA: Diagnosis not present

## 2021-11-10 DIAGNOSIS — H539 Unspecified visual disturbance: Secondary | ICD-10-CM | POA: Diagnosis not present

## 2021-11-14 DIAGNOSIS — H5319 Other subjective visual disturbances: Secondary | ICD-10-CM | POA: Diagnosis not present

## 2021-11-14 DIAGNOSIS — H40053 Ocular hypertension, bilateral: Secondary | ICD-10-CM | POA: Diagnosis not present

## 2021-11-14 DIAGNOSIS — E119 Type 2 diabetes mellitus without complications: Secondary | ICD-10-CM | POA: Diagnosis not present

## 2021-11-14 LAB — HM DIABETES EYE EXAM

## 2021-11-15 ENCOUNTER — Encounter: Payer: Self-pay | Admitting: Internal Medicine

## 2021-11-15 NOTE — Patient Instructions (Addendum)
     Blood work was ordered.     Medications changes include :   none   Your prescription(s) have been sent to your pharmacy.    A MRI of your brain was ordered.     Someone from that office will call you to schedule an appointment.    Return for follow up as scheduled.

## 2021-11-15 NOTE — Progress Notes (Signed)
Subjective:    Patient ID: Katherine Lawrence, female    DOB: [birth year], 66 y.o.   MRN: [id number]      HPI Yatziri is here for  Chief Complaint  Patient presents with   Headache   She states she is taking her medication daily.  She states she takes 5 pills a day.  Her prescriptions do not look up-to-date so I am not sure if that is true or not.   Work up just over one week ago.  She opened her eyes and she saw what looked like furs or acorns in the upper part of her vision and blurry vision. She had some dizziness. This was the first episode of it.  It was intermittent.  She went to urgent care and they said she was ok.  Saw Dr Katy Fitch and everything was ok.  It lasted two days. No reoccurrence.  Since then her eyes have been blurry and she has been off balance, dizzy.  That has been intermittent.    She denies any numbness, tingling or weakness in her arms or legs that are new.  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  Medications and allergies reviewed with patient and updated if appropriate.  Current Outpatient Medications on File Prior to Visit  Medication Sig Dispense Refill   ACCU-CHEK GUIDE test strip USE UP TO FOUR TIMES DAILY     Accu-Chek Softclix Lancets lancets SMARTSIG:Topical 1 to 4 Times Daily     amLODipine (NORVASC) 5 MG tablet TAKE 1 TABLET(5 MG) BY MOUTH DAILY for blood pressure 90 tablet 1   blood glucose meter kit and supplies KIT Dispense based on patient and insurance preference. Use up to four times daily as directed. (FOR E11.9). 1 each 0   donepezil (ARICEPT) 10 MG tablet Tak  1 tab 10 mg daily 30 tablet 11   losartan (COZAAR) 25 MG tablet TAKE 1 TABLET(25 MG) BY MOUTH DAILY for blood pressure 90 tablet 1   metFORMIN (GLUCOPHAGE) 500 MG tablet Take 1 tablet (500 mg total) by mouth 2 (two) times daily with a meal. For diabetes 180 tablet 1   polyethylene glycol powder (GLYCOLAX/MIRALAX) 17 GM/SCOOP powder Take 17 g by mouth 2  (two) times daily as needed for severe constipation. 3350 g 1   No current facility-administered medications on file prior to visit.    Review of Systems  Constitutional:  Negative for fever.  Eyes:  Positive for redness (dry eyes) and visual disturbance.  Respiratory:  Negative for shortness of breath.   Cardiovascular:  Negative for chest pain and palpitations.  Gastrointestinal:  Negative for nausea.  Endocrine: Positive for polydipsia and polyuria.  Neurological:  Positive for dizziness. Negative for headaches.      Objective:   Vitals:   11/18/21 0833 11/18/21 0844  BP: (!) 150/90 (!) 142/80  Pulse: 70   Temp: 98.4 F (36.9 C)   SpO2: 97%    BP Readings from Last 3 Encounters:  11/18/21 (!) 142/80  07/11/21 (!) 156/72  06/18/21 (!) 144/82   Wt Readings from Last 3 Encounters:  11/18/21 190 lb 9.6 oz (86.5 kg)  07/11/21 204 lb (92.5 kg)  06/18/21 207 lb (93.9 kg)   Body mass index is 32.72 kg/m.    Physical Exam Constitutional:      General: She is not in acute distress.    Appearance: Normal appearance. She is not ill-appearing.  HENT:  Head: Normocephalic and atraumatic.     Right Ear: Tympanic membrane, ear canal and external ear normal.     Left Ear: Tympanic membrane, ear canal and external ear normal.     Mouth/Throat:     Mouth: Mucous membranes are moist.     Pharynx: No oropharyngeal exudate or posterior oropharyngeal erythema.  Eyes:     Conjunctiva/sclera: Conjunctivae normal.  Cardiovascular:     Rate and Rhythm: Normal rate and regular rhythm.     Heart sounds: Murmur (2/6) heard.  Pulmonary:     Effort: Pulmonary effort is normal. No respiratory distress.     Breath sounds: Normal breath sounds. No wheezing or rales.  Musculoskeletal:     Cervical back: Neck supple. No tenderness.  Lymphadenopathy:     Cervical: No cervical adenopathy.  Skin:    General: Skin is warm and dry.  Neurological:     Mental Status: She is alert.      Cranial Nerves: No cranial nerve deficit, dysarthria or facial asymmetry.     Sensory: No sensory deficit.  Psychiatric:        Mood and Affect: Mood normal.           Assessment & Plan:    See Problem List for Assessment and Plan of chronic medical problems.

## 2021-11-18 ENCOUNTER — Ambulatory Visit (INDEPENDENT_AMBULATORY_CARE_PROVIDER_SITE_OTHER): Payer: BC Managed Care – PPO | Admitting: Internal Medicine

## 2021-11-18 VITALS — BP 142/80 | HR 70 | Temp 98.4°F | Ht 64.0 in | Wt 190.6 lb

## 2021-11-18 DIAGNOSIS — R42 Dizziness and giddiness: Secondary | ICD-10-CM

## 2021-11-18 DIAGNOSIS — H539 Unspecified visual disturbance: Secondary | ICD-10-CM

## 2021-11-18 DIAGNOSIS — E7800 Pure hypercholesterolemia, unspecified: Secondary | ICD-10-CM

## 2021-11-18 DIAGNOSIS — R350 Frequency of micturition: Secondary | ICD-10-CM

## 2021-11-18 DIAGNOSIS — E119 Type 2 diabetes mellitus without complications: Secondary | ICD-10-CM | POA: Diagnosis not present

## 2021-11-18 DIAGNOSIS — I10 Essential (primary) hypertension: Secondary | ICD-10-CM

## 2021-11-18 HISTORY — DX: Frequency of micturition: R35.0

## 2021-11-18 HISTORY — DX: Dizziness and giddiness: R42

## 2021-11-18 HISTORY — DX: Unspecified visual disturbance: H53.9

## 2021-11-18 LAB — URINALYSIS, ROUTINE W REFLEX MICROSCOPIC
Bilirubin Urine: NEGATIVE
Hgb urine dipstick: NEGATIVE
Ketones, ur: NEGATIVE
Nitrite: NEGATIVE
RBC / HPF: NONE SEEN (ref 0–?)
Specific Gravity, Urine: 1.02 (ref 1.000–1.030)
Total Protein, Urine: 30 — AB
Urine Glucose: NEGATIVE
Urobilinogen, UA: 0.2 (ref 0.0–1.0)
pH: 6 (ref 5.0–8.0)

## 2021-11-18 LAB — LIPID PANEL
Cholesterol: 207 mg/dL — ABNORMAL HIGH (ref 0–200)
HDL: 46.7 mg/dL (ref >39.00)
LDL Cholesterol: 139 mg/dL — ABNORMAL HIGH (ref 0–99)
NonHDL: 160.3
Total CHOL/HDL Ratio: 4
Triglycerides: 106 mg/dL (ref 0.0–149.0)
VLDL: 21.2 mg/dL (ref 0.0–40.0)

## 2021-11-18 LAB — CBC WITH DIFFERENTIAL/PLATELET
Basophils Absolute: 0 10*3/uL (ref 0.0–0.1)
Basophils Relative: 0.4 % (ref 0.0–3.0)
Eosinophils Absolute: 0.1 10*3/uL (ref 0.0–0.7)
Eosinophils Relative: 1.6 % (ref 0.0–5.0)
HCT: 45.6 % (ref 36.0–46.0)
Hemoglobin: 15.1 g/dL — ABNORMAL HIGH (ref 12.0–15.0)
Lymphocytes Relative: 24.2 % (ref 12.0–46.0)
Lymphs Abs: 1.9 10*3/uL (ref 0.7–4.0)
MCHC: 33 g/dL (ref 30.0–36.0)
MCV: 90.2 fl (ref 78.0–100.0)
Monocytes Absolute: 0.5 10*3/uL (ref 0.1–1.0)
Monocytes Relative: 5.8 % (ref 3.0–12.0)
Neutro Abs: 5.3 10*3/uL (ref 1.4–7.7)
Neutrophils Relative %: 68 % (ref 43.0–77.0)
Platelets: 257 10*3/uL (ref 150.0–400.0)
RBC: 5.06 Mil/uL (ref 3.87–5.11)
RDW: 13.7 % (ref 11.5–15.5)
WBC: 7.8 10*3/uL (ref 4.0–10.5)

## 2021-11-18 LAB — COMPREHENSIVE METABOLIC PANEL
ALT: 8 U/L (ref 0–35)
AST: 12 U/L (ref 0–37)
Albumin: 4.6 g/dL (ref 3.5–5.2)
Alkaline Phosphatase: 56 U/L (ref 39–117)
BUN: 16 mg/dL (ref 6–23)
CO2: 30 mEq/L (ref 19–32)
Calcium: 9.8 mg/dL (ref 8.4–10.5)
Chloride: 103 mEq/L (ref 96–112)
Creatinine, Ser: 0.57 mg/dL (ref 0.40–1.20)
GFR: 96.47 mL/min (ref >60.00)
Glucose, Bld: 113 mg/dL — ABNORMAL HIGH (ref 70–99)
Potassium: 4.1 mEq/L (ref 3.5–5.1)
Sodium: 141 mEq/L (ref 135–145)
Total Bilirubin: 0.8 mg/dL (ref 0.2–1.2)
Total Protein: 7.4 g/dL (ref 6.0–8.3)

## 2021-11-18 LAB — MICROALBUMIN / CREATININE URINE RATIO
Creatinine,U: 122.6 mg/dL
Microalb Creat Ratio: 13 mg/g (ref 0.0–30.0)
Microalb, Ur: 15.9 mg/dL — ABNORMAL HIGH (ref 0.0–1.9)

## 2021-11-18 LAB — HEMOGLOBIN A1C: Hgb A1c MFr Bld: 6.1 % (ref 4.6–6.5)

## 2021-11-18 MED ORDER — AMLODIPINE BESYLATE 5 MG PO TABS
ORAL_TABLET | ORAL | 1 refills | Status: DC
Start: 2021-11-18 — End: 2022-05-02

## 2021-11-18 MED ORDER — METFORMIN HCL 500 MG PO TABS: 500.0000 mg | ORAL_TABLET | Freq: Two times a day (BID) | ORAL | 1 refills | Status: DC

## 2021-11-18 MED ORDER — LOSARTAN POTASSIUM 25 MG PO TABS
ORAL_TABLET | ORAL | 1 refills | Status: DC
Start: 2021-11-18 — End: 2021-12-30

## 2021-11-18 MED ORDER — ATORVASTATIN CALCIUM 40 MG PO TABS: 40.0000 mg | ORAL_TABLET | Freq: Every day | ORAL | 3 refills | Status: DC

## 2021-11-18 NOTE — Assessment & Plan Note (Signed)
Acute Having intermittent dizziness for the past couple of weeks Was associated with her vision changes, blurred vision and being more Off balance Blood work today including CBC, CMP, A1c Check UA, urine culture to rule out UTI given urinary frequency MRI of the brain to rule out possible TIA/stroke

## 2021-11-18 NOTE — Assessment & Plan Note (Signed)
Acute 2 weeks ago woke up with a visual disturbance and had that intermittently for 2 days and it did go away She was initially evaluated at urgent care and by her eye doctor Dr. Katy Fitch and they did not see anything concerning With this came some blurry vision which she is still having, intermittent dizziness and worsening of her balance-the symptoms have been intermittent since then We will check basic blood work to make her sure her diabetes is controlled and there is no obvious other cause such as anemia, changes in kidney function We will get MRI of the brain to rule out TIA, stroke Depending on results of blood work and MRI we will determine further evaluation

## 2021-11-18 NOTE — Assessment & Plan Note (Signed)
Acute States urinary frequency ?  Uncontrolled diabetes, UTI or age related Check A1c Check UA, urine culture

## 2021-11-18 NOTE — Assessment & Plan Note (Signed)
Chronic ?  Taking atorvastatin or not-she states she is, but it looks like we have not refilled this in a while and her last couple LDL readings indicate that she is not taking it Atorvastatin 40 mg daily sent to pharmacy We will check lipid panel

## 2021-11-18 NOTE — Assessment & Plan Note (Signed)
Chronic Lab Results  Component Value Date   HGBA1C 6.1 11/18/2021   Sugars well controlled by blood work today-was concerned this could be causing some of her symptoms Continue metformin 500 mg twice daily Encouraged diabetic diet and being as active as possible

## 2021-11-18 NOTE — Assessment & Plan Note (Signed)
Chronic Does not appear to be ideally controlled I do question if she is taking her medication on a daily basis since we have not refilled this medication in a while so I am not sure if she is taking it consistently-she does have some memory issues Sent amlodipine 5 mg daily and losartan 25 mg daily prescriptions to her pharmacy We will follow-up at her next appointment and if still elevated will need to adjust medication

## 2021-11-22 ENCOUNTER — Telehealth: Payer: Self-pay | Admitting: Internal Medicine

## 2021-11-22 NOTE — Telephone Encounter (Signed)
Patient called back and was read her result note from Dr. Quay Burow

## 2021-12-03 ENCOUNTER — Ambulatory Visit
Admission: RE | Admit: 2021-12-03 | Discharge: 2021-12-03 | Disposition: A | Discharge status: Home / Self Care | Payer: BC Managed Care – PPO | Source: Ambulatory Visit | Attending: Internal Medicine | Admitting: Internal Medicine

## 2021-12-03 DIAGNOSIS — R42 Dizziness and giddiness: Secondary | ICD-10-CM | POA: Diagnosis not present

## 2021-12-03 DIAGNOSIS — R519 Headache, unspecified: Secondary | ICD-10-CM | POA: Diagnosis not present

## 2021-12-03 DIAGNOSIS — H539 Unspecified visual disturbance: Secondary | ICD-10-CM

## 2021-12-23 ENCOUNTER — Encounter: Payer: Self-pay | Admitting: Internal Medicine

## 2021-12-23 NOTE — Progress Notes (Signed)
Outside notes received. Information abstracted. Notes sent to scan.  

## 2021-12-29 ENCOUNTER — Encounter: Payer: Self-pay | Admitting: Internal Medicine

## 2021-12-29 NOTE — Progress Notes (Unsigned)
Subjective:    Patient ID: Katherine Lawrence, female    DOB: [birth year], 66 y.o.   MRN: [id number]     HPI Katherine Lawrence is here for follow up of her chronic medical problems, including htn, DM, hld, neurocognitive d/o  ? Still having blurry vision - still blurry.  She saw Dr Schuyler Amor  - better with new glasses.  Nothing concerning on exam - Dr Schuyler Amor was not worried.    BP not controlled - does not check at home.    Medications and allergies reviewed with patient and updated if appropriate.  Current Outpatient Medications on File Prior to Visit  Medication Sig Dispense Refill   ACCU-CHEK GUIDE test strip USE UP TO FOUR TIMES DAILY     Accu-Chek Softclix Lancets lancets SMARTSIG:Topical 1 to 4 Times Daily     amLODipine (NORVASC) 5 MG tablet TAKE 1 TABLET(5 MG) BY MOUTH DAILY for blood pressure 90 tablet 1   atorvastatin (LIPITOR) 40 MG tablet Take 1 tablet (40 mg total) by mouth daily. For cholesterol 90 tablet 3   blood glucose meter kit and supplies KIT Dispense based on patient and insurance preference. Use up to four times daily as directed. (FOR E11.9). 1 each 0   donepezil (ARICEPT) 10 MG tablet Tak  1 tab 10 mg daily 30 tablet 11   metFORMIN (GLUCOPHAGE) 500 MG tablet Take 1 tablet (500 mg total) by mouth 2 (two) times daily with a meal. For diabetes 180 tablet 1   polyethylene glycol powder (GLYCOLAX/MIRALAX) 17 GM/SCOOP powder Take 17 g by mouth 2 (two) times daily as needed for severe constipation. 3350 g 1   No current facility-administered medications on file prior to visit.     Review of Systems  Constitutional:  Negative for chills and fever.  Respiratory:  Positive for cough and wheezing. Negative for shortness of breath.   Cardiovascular:  Negative for chest pain, palpitations and leg swelling.  Musculoskeletal:  Positive for arthralgias.  Neurological:  Negative for light-headedness and headaches.       Objective:   Vitals:   12/30/21 1127  BP: 140/80  Pulse:  67  Temp: 98 F (36.7 C)  SpO2: 97%   BP Readings from Last 3 Encounters:  12/30/21 140/80  11/18/21 (!) 142/80  07/11/21 (!) 156/72   Wt Readings from Last 3 Encounters:  12/30/21 194 lb (88 kg)  11/18/21 190 lb 9.6 oz (86.5 kg)  07/11/21 204 lb (92.5 kg)   Body mass index is 33.3 kg/m.    Physical Exam Constitutional:      General: She is not in acute distress.    Appearance: Normal appearance.  HENT:     Head: Normocephalic and atraumatic.  Eyes:     Conjunctiva/sclera: Conjunctivae normal.  Cardiovascular:     Rate and Rhythm: Normal rate and regular rhythm.     Heart sounds: Normal heart sounds. No murmur heard. Pulmonary:     Effort: Pulmonary effort is normal. No respiratory distress.     Breath sounds: Normal breath sounds. No wheezing.  Musculoskeletal:     Cervical back: Neck supple.     Right lower leg: No edema.     Left lower leg: No edema.  Lymphadenopathy:     Cervical: No cervical adenopathy.  Skin:    General: Skin is warm and dry.     Findings: No rash.  Neurological:     Mental Status: She is alert. Mental status is at baseline.  Psychiatric:        Mood and Affect: Mood normal.        Behavior: Behavior normal.        Lab Results  Component Value Date   WBC 7.8 11/18/2021   HGB 15.1 (H) 11/18/2021   HCT 45.6 11/18/2021   PLT 257.0 11/18/2021   GLUCOSE 113 (H) 11/18/2021   CHOL 207 (H) 11/18/2021   TRIG 106.0 11/18/2021   HDL 46.70 11/18/2021   LDLDIRECT 157.6 11/16/2012   LDLCALC 139 (H) 11/18/2021   ALT 8 11/18/2021   AST 12 11/18/2021   NA 141 11/18/2021   K 4.1 11/18/2021   CL 103 11/18/2021   CREATININE 0.57 11/18/2021   BUN 16 11/18/2021   CO2 30 11/18/2021   TSH 1.85 12/06/2020   INR 1.03 07/08/2018   HGBA1C 6.1 11/18/2021   MICROALBUR 15.9 (H) 11/18/2021     Assessment & Plan:    See Problem List for Assessment and Plan of chronic medical problems.

## 2021-12-29 NOTE — Patient Instructions (Addendum)
     Follow up with Dr Lequita Halt for your knee pain.     Blood work was ordered.     Medications changes include :   increase losartan 25 mg a day to 50 mg daily.    Your prescription(s) have been sent to your pharmacy.     Return in about 6 months (around 07/02/2022) for follow up.

## 2021-12-30 ENCOUNTER — Ambulatory Visit (INDEPENDENT_AMBULATORY_CARE_PROVIDER_SITE_OTHER): Payer: BC Managed Care – PPO | Admitting: Internal Medicine

## 2021-12-30 VITALS — BP 140/80 | HR 67 | Temp 98.0°F | Ht 64.0 in | Wt 194.0 lb

## 2021-12-30 DIAGNOSIS — G309 Alzheimer's disease, unspecified: Secondary | ICD-10-CM | POA: Diagnosis not present

## 2021-12-30 DIAGNOSIS — F0670 Mild neurocognitive disorder due to known physiological condition without behavioral disturbance: Secondary | ICD-10-CM

## 2021-12-30 DIAGNOSIS — E119 Type 2 diabetes mellitus without complications: Secondary | ICD-10-CM

## 2021-12-30 DIAGNOSIS — I10 Essential (primary) hypertension: Secondary | ICD-10-CM

## 2021-12-30 DIAGNOSIS — Z8673 Personal history of transient ischemic attack (TIA), and cerebral infarction without residual deficits: Secondary | ICD-10-CM | POA: Diagnosis not present

## 2021-12-30 DIAGNOSIS — E7800 Pure hypercholesterolemia, unspecified: Secondary | ICD-10-CM

## 2021-12-30 MED ORDER — LOSARTAN POTASSIUM 50 MG PO TABS: 50.0000 mg | ORAL_TABLET | Freq: Every day | ORAL | 2 refills | Status: DC

## 2021-12-30 NOTE — Assessment & Plan Note (Signed)
Chronic Regular exercise and healthy diet encouraged Check lipid panel, CMP Continue atorvastatin 40 mg daily 

## 2021-12-30 NOTE — Assessment & Plan Note (Signed)
Chronic History of CVA-no residual-this was seen on MRI Check lipid panel, CMP-restarted atorvastatin at her last visit Continue atorvastatin 40 mg daily Blood pressure medication adjusted-BP not ideally controlled

## 2021-12-30 NOTE — Assessment & Plan Note (Signed)
Chronic  Lab Results  Component Value Date   HGBA1C 6.1 11/18/2021   Sugars well controlled Testing sugars 0-1 times a day Check A1c Continue metformin twice daily Encouraged regular exercise, diabetic diet

## 2021-12-30 NOTE — Assessment & Plan Note (Signed)
Chronic Blood pressure not controlled CMP Continue amlodipine 5 mg daily, increase losartan from 25 mg daily to 50 mg daily

## 2021-12-30 NOTE — Assessment & Plan Note (Signed)
Chronic Following with neurology Aricept 10 mg daily

## 2022-01-01 ENCOUNTER — Other Ambulatory Visit (INDEPENDENT_AMBULATORY_CARE_PROVIDER_SITE_OTHER): Payer: BC Managed Care – PPO

## 2022-01-01 DIAGNOSIS — E7800 Pure hypercholesterolemia, unspecified: Secondary | ICD-10-CM

## 2022-01-01 DIAGNOSIS — Z8673 Personal history of transient ischemic attack (TIA), and cerebral infarction without residual deficits: Secondary | ICD-10-CM | POA: Diagnosis not present

## 2022-01-01 DIAGNOSIS — E119 Type 2 diabetes mellitus without complications: Secondary | ICD-10-CM

## 2022-01-01 LAB — COMPREHENSIVE METABOLIC PANEL
ALT: 9 U/L (ref 0–35)
AST: 13 U/L (ref 0–37)
Albumin: 4.5 g/dL (ref 3.5–5.2)
Alkaline Phosphatase: 49 U/L (ref 39–117)
BUN: 17 mg/dL (ref 6–23)
CO2: 30 mEq/L (ref 19–32)
Calcium: 9.4 mg/dL (ref 8.4–10.5)
Chloride: 103 mEq/L (ref 96–112)
Creatinine, Ser: 0.59 mg/dL (ref 0.40–1.20)
GFR: 95.59 mL/min (ref >60.00)
Glucose, Bld: 102 mg/dL — ABNORMAL HIGH (ref 70–99)
Potassium: 3.8 mEq/L (ref 3.5–5.1)
Sodium: 143 mEq/L (ref 135–145)
Total Bilirubin: 0.6 mg/dL (ref 0.2–1.2)
Total Protein: 7 g/dL (ref 6.0–8.3)

## 2022-01-01 LAB — LIPID PANEL
Cholesterol: 221 mg/dL — ABNORMAL HIGH (ref 0–200)
HDL: 43.5 mg/dL (ref >39.00)
LDL Cholesterol: 142 mg/dL — ABNORMAL HIGH (ref 0–99)
NonHDL: 177.52
Total CHOL/HDL Ratio: 5
Triglycerides: 178 mg/dL — ABNORMAL HIGH (ref 0.0–149.0)
VLDL: 35.6 mg/dL (ref 0.0–40.0)

## 2022-01-01 LAB — HEMOGLOBIN A1C: Hgb A1c MFr Bld: 6.3 % (ref 4.6–6.5)

## 2022-01-09 ENCOUNTER — Ambulatory Visit (INDEPENDENT_AMBULATORY_CARE_PROVIDER_SITE_OTHER): Payer: BC Managed Care – PPO | Admitting: Physician Assistant

## 2022-01-09 ENCOUNTER — Encounter: Payer: Self-pay | Admitting: Physician Assistant

## 2022-01-09 VITALS — BP 172/88 | HR 69 | Ht 64.0 in | Wt 192.0 lb

## 2022-01-09 DIAGNOSIS — F0670 Mild neurocognitive disorder due to known physiological condition without behavioral disturbance: Secondary | ICD-10-CM

## 2022-01-09 DIAGNOSIS — G309 Alzheimer's disease, unspecified: Secondary | ICD-10-CM

## 2022-01-09 MED ORDER — MEMANTINE HCL 5 MG PO TABS: ORAL_TABLET | ORAL | 11 refills | Status: DC

## 2022-01-09 NOTE — Progress Notes (Signed)
Assessment/Plan:   Mild Cognitive Impairment likely due to Alzheimer's disease  Katherine Lawrence is a very pleasant 66 y.o. 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  Last MMSE was 26/30. She is on donepezil 10 mg daily, tolerating well.    Recommendations:    Continue donepezil 10 mg daily Side effects were discussed Continue cognitive behavioral therapy for situational depression Repeat neuropsychological evaluation in 8 to 14 months for clarity of the diagnosis and disease trajectory Follow up after neurocognitive evaluation    Case discussed with Dr. Karel Lawrence who agrees with the plan     Subjective:   This patient is accompanied in the office by her who supplements the history.  Previous records as well as any outside records available were reviewed prior to todays visit.  Patient was last seen at our office on 07/11/2021 at which time her MMSE was 26/30   Any changes in memory since last visit?  She reports that her memory is about the same.  She may have some memory lapses in which she does not recall a conversation, or there are instances in which she cannot find the right work, provoking some level of frustration on her.  She does have some decreased attention and concentration. Patient lives with: Husband repeats oneself?  Endorsed.  Her husband reports that several times she asks same "did you feed the dog?" Disoriented when walking into a room?  Patient denies   Leaving objects in unusual places?  Patient denies   Ambulates  with difficulty?   Patient denies she is going to see an orthopedic doctor for knee replacement on the left.  This causes her some discomfort, but she denies any recent falls.  She admits to not being very  active. Recent falls?  Patient denies   Any head injuries?  Patient denies   History of seizures?   Patient denies   Wandering behavior?  Patient denies   Patient drives?   She is more impatient with driving Any mood changes? She becomes more irritable about receiving junk in May, or telemarketing calls.  She also feels slight situational depression, especially when people do not come to visit her often.  However, she is excited because her son is getting married soon and he gives her an opportunity to celebrate. Hallucinations?  Patient denies   Paranoia?  Patient denies   Patient reports that he sleeps well without vivid dreams, REM behavior or sleepwalking   History of sleep apnea?  Patient denies   Any hygiene concerns?  Patient denies   Independent of bathing and dressing?  Endorsed  Does the patient needs help with medications?  Denies Who is in charge of the finances?   is in charge    Any changes in appetite?  "I lost some weight, and now my appetite is decreased " Patient have trouble swallowing? Patient denies   Does the patient cook?  Patient denies   Any kitchen accidents such as leaving the stove on? Patient denies   Any headaches?  Patient denies   Double vision? Patient denies   Any focal numbness or tingling?  Patient denies   Chronic back pain Patient denies   Unilateral weakness?  Patient denies   Any tremors?  Patient denies   Any history of anosmia?  Patient denies   Any incontinence of urine?  Endorsed, recently she started to use depends Any bowel dysfunction?   Patient denies     Neuropsychological evaluation 03/12/21 :  results suggested severe impairment surrounding all aspects of verbal learning and memory. Additional impairments were exhibited across confrontation naming, verbal fluency (semantic worse than phonemic), and executive functioning (including a task assessing safety and judgment). Regarding etiology, despite Katherine Lawrence's young age, I unfortunately do  have concerns surrounding Alzheimer's disease. She demonstrated prominent verbal memory dysfunction with evidence to suggest a deficit with information storage. She also demonstrated impairments in confrontation naming, semantic fluency, and executive functioning. Overall, this does represent a fairly classic pattern of dysfunction in earlier stages of this disease process. 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 However, upon review of these images, possible areas represented do not involve the hippocampus or other primary memory centers of the brain. As such, while there may be a mild vascular contribution to her current presentation, a vascular etiology is unlikely to fully account for memory impairment or other areas of dysfunction.   [facility] MRI on 04/23/2015 revealed age-advanced cerebral atrophy, most notable in the frontal regions. Brain MRI on 03/23/2019 revealed mild frontal atrophy bilaterally, as well as small deep white matter infarcts in the left hemisphere.  Negative for acute infarct. Mild chronic ischemia left cerebral white matter has developed since 2016. Mild frontal atrophy.     History of Present Illness 04/21/2019:  The patient had a telephone visit on 04/21/2019. She was last seen in the neurology clinic 10 months ago for myalgias. EMG/NCV did not show any evidence of myopathy, bloodwork no evidence of inflammation. She was seen in 2016 for memory changes. MRI brain in 04/2015 unremarkable. She reports that since her last visit, she is noticing more cognitive changes. She has noticed more hesitation during conversations, she would have to stop and think of what she was going to say. She was laid off in October and offered early retirement, she noticed toward the end that she had to write down everything she was going to say if she had to speak on conference calls. She would go blank when called on to discuss something. She has gotten lost driving, unable to  recall how to get to places she has been to for years. She has left the stove on. She infrequently forgets medications. She denies missing bill payments. Sleep and mood are good. She had a knee replacement with continued pain. She denies any headaches, dizziness, vision changes, focal numbness/tingling/weakness. She is not having as much myalgias as before. She had a repeat MRI brain without contrast done 03/2019 did not show any acute changes. 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     Past Medical History:  Diagnosis Date   Allergy    CIN I (cervical intraepithelial neoplasia I) 1996   cryo...  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   Osteoarthritis of left hip 05/10/2017   Osteoarthritis of left knee    Pending L TKR fall 2015 - [facility]   Palpitations 08/05/2019   Pneumonia 06/2003   Postsurgical menopause    Pure hypercholesterolemia 04/03/2008   S/P total knee replacement using cement 02/14/2014   Type 2 diabetes mellitus without complication, without long-term current use of insulin 04/13/2015   Dr Katy Fitch     Past Surgical History:  Procedure Laterality Date   ABDOMINAL HYSTERECTOMY  08/22/2011    CERVICAL CONE BIOPSY  1996   CESAREAN SECTION     x1   COLONOSCOPY W/ POLYPECTOMY  12/2008   x3   DILATION AND CURETTAGE OF UTERUS     KNEE ARTHROTOMY Left 08/15/2019   Procedure: Left knee arthrotomy; scar excision;  Surgeon: Gaynelle Arabian, MD;  Location: [facility];  Service: Orthopedics;  Laterality: Left;  52min   TONSILLECTOMY     TOTAL HIP ARTHROPLASTY Right 2008   TOTAL HIP ARTHROPLASTY Left 05/20/2017   Procedure: LEFT TOTAL HIP ARTHROPLASTY ANTERIOR APPROACH;  Surgeon: Gaynelle Arabian, MD;  Location: [facility];  Service: Orthopedics;  Laterality: Left;   TOTAL KNEE ARTHROPLASTY Left 02/14/2014   DR Durward Fortes   TOTAL KNEE ARTHROPLASTY Left 02/14/2014   Procedure: TOTAL KNEE ARTHROPLASTY;  Surgeon: Garald Balding, MD;  Location: [facility];  Service: Orthopedics;  Laterality: Left;   TOTAL KNEE REVISION Left 07/14/2018   Procedure: TOTAL KNEE REVISION;  Surgeon: Gaynelle Arabian, MD;  Location: [facility];  Service: Orthopedics;  Laterality: Left;  Adductor Block     PREVIOUS MEDICATIONS:   CURRENT MEDICATIONS:  Outpatient Encounter Medications as of 01/09/2022  Medication Sig   amLODipine (NORVASC) 5 MG tablet TAKE 1 TABLET(5 MG) BY MOUTH DAILY for blood pressure   atorvastatin (LIPITOR) 40 MG tablet Take 1 tablet (40 mg total) by mouth daily. For cholesterol   donepezil (ARICEPT) 10 MG tablet Tak  1 tab 10 mg daily   losartan (COZAAR) 50 MG tablet Take 1 tablet (50 mg total) by mouth daily. For blood pressure   memantine (NAMENDA) 5 MG tablet Start Memantine 5 mg: Take 1 tablet (5 mg at night) for 2 weeks, then increase to 1 tablet (5 mg) twice a day   metFORMIN (GLUCOPHAGE) 500 MG tablet Take 1 tablet (500 mg total) by mouth 2 (two) times daily with a meal. For diabetes   ACCU-CHEK GUIDE test strip USE UP TO FOUR TIMES DAILY (Patient not taking: Reported on 01/09/2022)   Accu-Chek Softclix Lancets lancets SMARTSIG:Topical 1 to 4 Times Daily (Patient not taking: Reported on 01/09/2022)   blood  glucose meter kit and supplies KIT Dispense based on patient and insurance preference. Use up to four times daily as directed. (FOR E11.9). (Patient not taking: Reported on 01/09/2022)   polyethylene glycol powder (GLYCOLAX/MIRALAX) 17 GM/SCOOP powder Take 17 g by mouth 2 (two) times daily as needed for severe constipation.   No facility-administered encounter medications on file as of 01/09/2022.     Objective:     PHYSICAL EXAMINATION:    VITALS:   Vitals:   01/09/22 1121  BP: (!) 172/88  Pulse: 69  SpO2: 96%  Weight: 192 lb (87.1 kg)  Height: $Remove'5\' 4"'dFLPYOs$  (1.626 m)  GEN:  The patient appears stated age and is in NAD. HEENT:  Normocephalic, atraumatic.   Neurological examination:  General: NAD, well-groomed, appears stated age. Orientation: The patient is alert. Oriented to person, place and date Cranial nerves: There is good facial symmetry.The speech is not very fluent  but clear. No aphasia or dysarthria, but at times tangential. Fund of knowledge is appropriate. Recent memory impaired and remote memory is normal.  Attention and concentration are reduced.  Able to name objects and repeat phrases.  Hearing is intact to conversational tone.    Sensation: Sensation is intact to light touch throughout Motor: Strength is at least antigravity x4. Tremors: none  DTR's 2/4 in UE/LE      06/21/2018    9:00 AM 04/13/2015    1:20 PM  Montreal Cognitive Assessment   Visuospatial/ Executive (0/5) 4 3  Naming (0/3) 3 3  Attention: Read list of digits (0/2) 2 2  Attention: Read list of letters (0/1) 1 1  Attention: Serial 7 subtraction starting at 100 (0/3) 3 3  Language: Repeat phrase (0/2) 2 2  Language : Fluency (0/1) 0 0  Abstraction (0/2) 2 2  Delayed Recall (0/5) 0 2  Orientation (0/6) 6 6  Total 23 24  Adjusted Score (based on education)  24       07/11/2021    3:00 PM  MMSE - Mini Mental State Exam  Orientation to time 4  Orientation to Place 5  Registration 3   Attention/ Calculation 5  Recall 1  Language- name 2 objects 2  Language- repeat 1  Language- follow 3 step command 3  Language- read & follow direction 1  Write a sentence 1  Copy design 0  Total score 26       Movement examination: Tone: There is normal tone in the UE/LE Abnormal movements:  no tremor.  No myoclonus.  No asterixis.   Coordination:  There is no decremation with RAM's. Normal finger to nose  Gait and Station: The patient has no difficulty arising out of a deep-seated chair without the use of the hands. The patient's stride length is good.  Gait is cautious and narrow.   Thank you for allowing [place] the opportunity to participate in the care of this nice patient. Please do not hesitate to contact us for any questions or concerns.   Total time spent on today's visit was 30 minutes dedicated to this patient today, preparing to see patient, examining the patient, ordering tests and/or medications and counseling the patient, documenting clinical information in the EHR or other health record, independently interpreting results and communicating results to the patient/family, discussing treatment and goals, answering patient's questions and coordinating care.  Cc:  Binnie Rail, MD  Sharene Butters 01/10/2022 10:59 AM   Cc:  Binnie Rail, MD Sharene Butters, PA-C

## 2022-01-09 NOTE — Patient Instructions (Addendum)
It was a pleasure to see you today at our office.   Recommendations:  Meds: Follow up in 6 months Continue Donepezil 10 mg daily.  Side effects discussed  Start Memantine '5mg'$  tablets.  Take 1 tablet at bedtime for 2 weeks, then 1 tablet twice daily.    Repeat neuropsychological evaluation   Clinicaltrials.gov website  Triad clinical trial    RECOMMENDATIONS FOR ALL PATIENTS WITH MEMORY PROBLEMS: 1. Continue to exercise (Recommend 30 minutes of walking everyday, or 3 hours every week) 2. Increase social interactions - continue going to [place] and enjoy social gatherings with friends and family 3. Eat healthy, avoid fried foods and eat more fruits and vegetables 4. Maintain adequate blood pressure, blood sugar, and blood cholesterol level. Reducing the risk of stroke and cardiovascular disease also helps promoting better memory. 5. Avoid stressful situations. Live a simple life and avoid aggravations. Organize your time and prepare for the next day in anticipation. 6. Sleep well, avoid any interruptions of sleep and avoid any distractions in the bedroom that may interfere with adequate sleep quality 7. Avoid sugar, avoid sweets as there is a strong link between excessive sugar intake, diabetes, and cognitive impairment We discussed the Mediterranean diet, which has been shown to help patients reduce the risk of progressive memory disorders and reduces cardiovascular risk. This includes eating fish, eat fruits and green leafy vegetables, nuts like almonds and hazelnuts, walnuts, and also use olive oil. Avoid fast foods and fried foods as much as possible. Avoid sweets and sugar as sugar use has been linked to worsening of memory function.  There is always a concern of gradual progression of memory problems. If this is the case, then we may need to adjust level of care according to patient needs. Support, both to the patient and caregiver, should then be put into place.    The Alzheimer's  Association is here all day, every day for people facing Alzheimer's disease through our free 24/7 Helpline: [phone])365-7164. The Helpline provides reliable information and support to all those who need assistance, such as individuals living with memory loss, Alzheimer's or other dementia, caregivers, health care professionals and the public.  Our highly trained and knowledgeable staff can help you with: Understanding memory loss, dementia and Alzheimer's  Medications and other treatment options  General information about aging and brain health  Skills to provide quality care and to find the best care from professionals  Legal, financial and living-arrangement decisions Our Helpline also features: Confidential care consultation provided by master's level clinicians who can help with decision-making support, crisis assistance and education on issues families face every day  Help in a caller's preferred language using our translation service that features more than 200 languages and dialects  Referrals to local community programs, services and ongoing support     FALL PRECAUTIONS: Be cautious when walking. Scan the area for obstacles that may increase the risk of trips and falls. When getting up in the mornings, sit up at the edge of the bed for a few minutes before getting out of bed. Consider elevating the bed at the head end to avoid drop of blood pressure when getting up. Walk always in a well-lit room (use night lights in the walls). Avoid area rugs or power cords from appliances in the middle of the walkways. Use a walker or a cane if necessary and consider physical therapy for balance exercise. Get your eyesight checked regularly.  FINANCIAL OVERSIGHT: Supervision, especially oversight when making financial decisions or  transactions is also recommended.  HOME SAFETY: Consider the safety of the kitchen when operating appliances like stoves, microwave oven, and blender. Consider having supervision  and share cooking responsibilities until no longer able to participate in those. Accidents with firearms and other hazards in the house should be identified and addressed as well.   ABILITY TO BE LEFT ALONE: If patient is unable to contact 911 operator, consider using LifeLine, or when the need is there, arrange for someone to stay with patients. Smoking is a fire hazard, consider supervision or cessation. Risk of wandering should be assessed by caregiver and if detected at any point, supervision and safe proof recommendations should be instituted.  MEDICATION SUPERVISION: Inability to self-administer medication needs to be constantly addressed. Implement a mechanism to ensure safe administration of the medications.   DRIVING: Regarding driving, in patients with progressive memory problems, driving will be impaired. We advise to have someone else do the driving if trouble finding directions or if minor accidents are reported. Independent driving assessment is available to determine safety of driving.   If you are interested in the driving assessment, you can contact the following:  [employer] in [place]  [place] [phone] or [phone]      [place] refers to food and lifestyle choices that are based on the traditions of countries located on the [employer]. This way of eating has been shown to help prevent certain conditions and improve outcomes for people who have chronic diseases, like kidney disease and heart disease. What are tips for following this plan? Lifestyle  Cook and eat meals together with your family, when possible. Drink enough fluid to keep your urine clear or pale yellow. Be physically active every day. This includes: Aerobic exercise like running or swimming. Leisure activities like gardening, walking, or  housework. Get 7-8 hours of sleep each night. If recommended by your health care provider, drink red wine in moderation. This means 1 glass a day for nonpregnant women and 2 glasses a day for men. A glass of wine equals 5 oz (150 mL). Reading food labels  Check the serving size of packaged foods. For foods such as rice and pasta, the serving size refers to the amount of cooked product, not dry. Check the total fat in packaged foods. Avoid foods that have saturated fat or trans fats. Check the ingredients list for added sugars, such as corn syrup. Shopping  At the grocery store, buy most of your food from the areas near the walls of the store. This includes: Fresh fruits and vegetables (produce). Grains, beans, nuts, and seeds. Some of these may be available in unpackaged forms or large amounts (in bulk). Fresh seafood. Poultry and eggs. Low-fat dairy products. Buy whole ingredients instead of prepackaged foods. Buy fresh fruits and vegetables in-season from local farmers markets. Buy frozen fruits and vegetables in resealable bags. If you do not have access to quality fresh seafood, buy precooked frozen shrimp or canned fish, such as tuna, salmon, or sardines. Buy small amounts of raw or cooked vegetables, salads, or olives from the deli or salad bar at your store. Stock your pantry so you always have certain foods on hand, such as olive oil, canned tuna, canned tomatoes, rice, pasta, and beans. Cooking  Cook foods with extra-virgin olive oil instead of using butter or other vegetable oils. Have meat as a side dish, and have vegetables or  grains as your main dish. This means having meat in small portions or adding small amounts of meat to foods like pasta or stew. Use beans or vegetables instead of meat in common dishes like chili or lasagna. Experiment with different cooking methods. Try roasting or broiling vegetables instead of steaming or sauteing them. Add frozen vegetables to soups,  stews, pasta, or rice. Add nuts or seeds for added healthy fat at each meal. You can add these to yogurt, salads, or vegetable dishes. Marinate fish or vegetables using olive oil, lemon juice, garlic, and fresh herbs. Meal planning  Plan to eat 1 vegetarian meal one day each week. Try to work up to 2 vegetarian meals, if possible. Eat seafood 2 or more times a week. Have healthy snacks readily available, such as: Vegetable sticks with hummus. Greek yogurt. Fruit and nut trail mix. Eat balanced meals throughout the week. This includes: Fruit: 2-3 servings a day Vegetables: 4-5 servings a day Low-fat dairy: 2 servings a day Fish, poultry, or lean meat: 1 serving a day Beans and legumes: 2 or more servings a week Nuts and seeds: 1-2 servings a day Whole grains: 6-8 servings a day Extra-virgin olive oil: 3-4 servings a day Limit red meat and sweets to only a few servings a month What are my food choices? Mediterranean diet Recommended Grains: Whole-grain pasta. Brown rice. Bulgar wheat. Polenta. Couscous. Whole-wheat bread. [place]. Vegetables: Artichokes. Beets. Broccoli. Cabbage. Carrots. Eggplant. Green beans. Chard. Kale. Spinach. Onions. Leeks. Peas. Squash. Tomatoes. Peppers. Radishes. Fruits: Apples. Apricots. Avocado. Berries. Bananas. Cherries. Dates. Figs. Grapes. Lemons. Melon. Oranges. Peaches. Plums. Pomegranate. Meats and other protein foods: Beans. Almonds. Sunflower seeds. Pine nuts. Peanuts. [place]. Salmon. Scallops. Shrimp. [place]. Tilapia. Clams. Oysters. Eggs. Dairy: Low-fat milk. Cheese. Greek yogurt. Beverages: Water. Red wine. Herbal tea. Fats and oils: Extra virgin olive oil. Avocado oil. Grape seed oil. Sweets and desserts: [place] yogurt with honey. Baked apples. Poached pears. Trail mix. Seasoning and other foods: Basil. Cilantro. Coriander. Cumin. Mint. Parsley. Sage. Rosemary. Tarragon. Garlic. Oregano. Thyme. Pepper. Balsalmic vinegar. Tahini. Hummus. Tomato  sauce. Olives. Mushrooms. Limit these Grains: Prepackaged pasta or rice dishes. Prepackaged cereal with added sugar. Vegetables: Deep fried potatoes (french fries). Fruits: Fruit canned in syrup. Meats and other protein foods: Beef. Pork. Lamb. Poultry with skin. Hot dogs. [place] Salines. Dairy: Ice cream. Sour cream. Whole milk. Beverages: Juice. Sugar-sweetened soft drinks. Beer. Liquor and spirits. Fats and oils: Butter. Canola oil. Vegetable oil. Beef fat (tallow). Lard. Sweets and desserts: Cookies. Cakes. Pies. Candy. Seasoning and other foods: Mayonnaise. Premade sauces and marinades. The items listed may not be a complete list. Talk with your dietitian about what dietary choices are right for you. Summary The Mediterranean diet includes both food and lifestyle choices. Eat a variety of fresh fruits and vegetables, beans, nuts, seeds, and whole grains. Limit the amount of red meat and sweets that you eat. Talk with your health care provider about whether it is safe for you to drink red wine in moderation. This means 1 glass a day for nonpregnant women and 2 glasses a day for men. A glass of wine equals 5 oz (150 mL). This information is not intended to replace advice given to you by your health care provider. Make sure you discuss any questions you have with your health care provider. Document Released: 01/24/2016 Document Revised: 02/26/2016 Document Reviewed: 01/24/2016 Elsevier Interactive Patient Education  2017 [employer].

## 2022-01-28 ENCOUNTER — Encounter: Payer: Self-pay | Admitting: Psychology

## 2022-02-13 DIAGNOSIS — Z96652 Presence of left artificial knee joint: Secondary | ICD-10-CM | POA: Diagnosis not present

## 2022-02-13 DIAGNOSIS — M1711 Unilateral primary osteoarthritis, right knee: Secondary | ICD-10-CM | POA: Diagnosis not present

## 2022-02-13 DIAGNOSIS — M25561 Pain in right knee: Secondary | ICD-10-CM | POA: Diagnosis not present

## 2022-02-16 DIAGNOSIS — M1711 Unilateral primary osteoarthritis, right knee: Secondary | ICD-10-CM

## 2022-02-16 HISTORY — DX: Unilateral primary osteoarthritis, right knee: M17.11

## 2022-04-16 ENCOUNTER — Telehealth: Payer: Self-pay | Admitting: *Deleted

## 2022-04-16 ENCOUNTER — Encounter: Payer: Self-pay | Admitting: *Deleted

## 2022-04-16 NOTE — Patient Outreach (Signed)
  Care Coordination   04/16/2022 Name: KAYLYN GARROW MRN: [id number] DOB: [birth year]   Care Coordination Outreach Attempts:  An unsuccessful telephone outreach was attempted today to offer the patient information about available care coordination services as a benefit of their health plan.   Follow Up Plan:  Additional outreach attempts will be made to offer the patient care coordination information and services.   Encounter Outcome:  No Answer left voice message requesting call back  Care Coordination Interventions Activated:  No   Care Coordination Interventions:  No, not indicated unsuccessful outreach attempt # 1   Oneta Rack, RN, BSN, CCRN Alumnus RN CM Care Coordination/ Transition of [place] Management [phone]: direct office

## 2022-04-17 DIAGNOSIS — Z1231 Encounter for screening mammogram for malignant neoplasm of breast: Secondary | ICD-10-CM | POA: Diagnosis not present

## 2022-04-17 LAB — HM MAMMOGRAPHY

## 2022-04-21 ENCOUNTER — Telehealth: Payer: Self-pay | Admitting: *Deleted

## 2022-04-21 ENCOUNTER — Encounter: Payer: Self-pay | Admitting: *Deleted

## 2022-04-21 NOTE — Patient Outreach (Signed)
  Care Coordination   04/21/2022 Name: Katherine Lawrence MRN: [id number] DOB: 01-06-1956   Care Coordination Outreach Attempts:  A second unsuccessful outreach was attempted today to offer the patient with information about available care coordination services as a benefit of their health plan.     Follow Up Plan:  Additional outreach attempts will be made to offer the patient care coordination information and services.   Encounter Outcome:  No Answer left voice message requesting call back  Care Coordination Interventions Activated:  No   Care Coordination Interventions:  No, not indicated unsuccessful outreach attempt # 2    Oneta Rack, RN, BSN, CCRN Alumnus RN CM Care Coordination/ Transition of [place] Management [phone]: direct office

## 2022-04-22 ENCOUNTER — Other Ambulatory Visit: Payer: Self-pay | Admitting: Physician Assistant

## 2022-05-01 ENCOUNTER — Other Ambulatory Visit: Payer: Self-pay | Admitting: Internal Medicine

## 2022-05-01 ENCOUNTER — Encounter: Payer: Self-pay | Admitting: Internal Medicine

## 2022-05-01 NOTE — Progress Notes (Signed)
Outside notes received. Information abstracted. Notes sent to scan.  

## 2022-05-02 ENCOUNTER — Telehealth: Payer: Self-pay | Admitting: *Deleted

## 2022-05-02 ENCOUNTER — Encounter: Payer: Self-pay | Admitting: *Deleted

## 2022-05-02 NOTE — Patient Outreach (Addendum)
Care Coordination   Initial Visit Note   05/02/2022 Name: Katherine Lawrence MRN: [id number] DOB: 11-29-1955  Katherine Lawrence is a 66 y.o. year old female who sees Burns, Katherine Lick, MD for primary care. I spoke with  Katherine Lawrence by phone today when she returned my call from earlier today; she asked that I call her back within an hour so she could gather her medications for our discussion; her spouse/ caregiver Katherine Lawrence, Katherine Lawrence contacted me back in the meantime and verified HIPAA identifiers for patient; Entirety of conversation today completed with spouse/ caregiver   What matters to the patients health and wellness today?  Per caregiver/ spouse Katherine Lawrence, Katherine [facility] DPR:  "She is doing pretty good; she has early onset Alzheimer's but is high functioning and very independent; she is able to drive herself short distances and go shopping, get dressed and cleaned up for the day, and clean our home. I do help her with her medications because she has a tendency to not want to take her medications-- in fact, she sometimes throws them away, which she just did a few days ago.  I have already contacted the outpatient pharmacy and have re- ordered the medicines I can't find.  I am going to start physically giving her the medicine myself so I can be sure she is taking it, whether I am at work or not-- I can do this, because she is really not Katherine that many medications.  I am not to the point yet where I need support in  caring for her, I will let you know if anything comes up that I can use some help with.  We will get out flu vaccines soon"  No further or ongoing care coordination needs identified today- provided caregiver with my direct contact information should needs arise in the future    Goals Addressed             This Visit's Progress    Care Coordination Activities: No follow up required   Katherine track    Care Coordination Interventions: Evaluation of current treatment plan related to HTN; cognitive decline and  patient's adherence to plan as established by provider Advised patient to provide appropriate vaccination information to provider or CM team member at next visit Advised patient to consider obtaining flu vaccine for 2023-24 flu/ winter season: provided dates and phone numbers for upcoming flu clinics Provided education to patient re: caregiver resources Reviewed medications with patient and discussed adherence: caregiver/ spouse reports that patient has a tendency to hide/ throw her medication due to early onset Alzheimers; reports she is high functioning and is even able to drive herself short distances, but does not like taking medication- caregiver confirms he manages patient's medications and has contacted outpatient pharmacy and developed strategy where he administers patient's medications; we reviewed medications today, and encouraged caregiver to initiate the strategies he has considered- he verbalizes understanding; he denies need for additional pharmacy resources today Provided patient and/or caregiver with MyChart information about caregiver resources/ support (community resource) Provided patient with Mychart educational materials related to medication administration strategies/ caregiver support Reviewed scheduled/upcoming provider appointments including 06/30/21- PCP; 07/14/21- neurology provider Advised patient to discuss any recommended medication dosing changes with provider Screening for signs and symptoms of depression related to chronic disease state  Assessed social determinant of health barriers Reviewed last PCP office visit notes from 17/17/23 with caregiver; made him aware of recommended changes to medications at that time  SDOH assessments and interventions completed:  Yes  SDOH Interventions Today    Flowsheet Row Most Recent Value  SDOH Interventions   Food Insecurity Interventions Intervention Not Indicated  Transportation Interventions Intervention Not  Indicated  [patient drives short distances,  husband provides transportation as needed]      Care Coordination Interventions Activated:  Yes  Care Coordination Interventions:  Yes, provided   Follow up plan: No further intervention required.   Encounter Outcome:  Pt. Visit Completed   Oneta Rack, RN, BSN, CCRN Alumnus RN CM Care Coordination/ Transition of [place] Management [phone]: direct office

## 2022-05-02 NOTE — Patient Outreach (Signed)
  Care Coordination   05/02/2022 Name: Katherine Lawrence MRN: [id number] DOB: Dec 19, 1955   Care Coordination Outreach Attempts:  A third unsuccessful outreach was attempted today to offer the patient with information about available care coordination services as a benefit of their health plan.   Follow Up Plan:  No further outreach attempts will be made at this time. We have been unable to contact the patient to offer or enroll patient in care coordination services  Encounter Outcome:  No Answer left voice message requesting call back  Care Coordination Interventions Activated:  No   Care Coordination Interventions:  No, not indicated unsuccessful attempt # 3   Oneta Rack, RN, BSN, CCRN Alumnus RN CM Care Coordination/ Transition of [employer] Management [phone]: direct office

## 2022-05-02 NOTE — Patient Instructions (Signed)
Visit Information  Thank you for taking time to visit with me today. Please don't hesitate to contact me if I can be of assistance to you.   Following are the goals we discussed today:   Goals Addressed             This Visit's Progress    Care Coordination Activities: No follow up required   On track    Care Coordination Interventions: Evaluation of current treatment plan related to HTN; cognitive decline and patient's adherence to plan as established by provider Advised patient to provide appropriate vaccination information to provider or CM team member at next visit Advised patient to consider obtaining flu vaccine for 2023-24 flu/ winter season: provided dates and phone numbers for upcoming flu clinics Provided education to patient re: caregiver resources Reviewed medications with patient and discussed adherence: caregiver/ spouse reports that patient has a tendency to hide/ throw her medication due to early onset Alzheimers; reports she is high functioning and is even able to drive herself short distances, but does not like taking medication- caregiver confirms he manages patient's medications and has contacted outpatient pharmacy and developed strategy where he administers patient's medications; we reviewed medications today, and encouraged caregiver to initiate the strategies he has considered- he verbalizes understanding; he denies need for additional pharmacy resources today Provided patient and/or caregiver with MyChart information about caregiver resources/ support (community resource) Provided patient with Mychart educational materials related to medication administration strategies/ caregiver support Reviewed scheduled/upcoming provider appointments including 06/30/21- PCP; 07/14/21- neurology provider Advised patient to discuss any recommended medication dosing changes with provider Screening for signs and symptoms of depression related to chronic disease state  Assessed social  determinant of health barriers Reviewed last PCP office visit notes from 17/17/23 with caregiver; made him aware of recommended changes to medications at that time          If you are experiencing a Mental Health or [place] or need someone to talk to, please  call the Suicide and Crisis Lifeline: 988 call the [place] National Suicide Prevention Lifeline: 7184659785 or TTY: 684-768-4172 TTY 726-263-9883) to talk to a trained counselor call 1-800-273-TALK (toll free, 24 hour hotline) go to [facility] [address] [place] [phone]) call the [place]: [phone] call 911   Patient verbalizes understanding of instructions and care plan provided today and agrees to view in [place]. Active MyChart status and patient understanding of how to access instructions and care plan via MyChart confirmed with patient.     No further follow up required: caregiver denies further/ ongoing care coordination needs today and was provided with my direct phone number should needs arise in the future  Oneta Rack, RN, BSN, [place] RN CM Care Coordination/ Transition of [place] Management [phone]: direct office

## 2022-05-19 ENCOUNTER — Other Ambulatory Visit: Payer: Self-pay | Admitting: Internal Medicine

## 2022-05-20 DIAGNOSIS — H10413 Chronic giant papillary conjunctivitis, bilateral: Secondary | ICD-10-CM | POA: Diagnosis not present

## 2022-05-20 DIAGNOSIS — H40053 Ocular hypertension, bilateral: Secondary | ICD-10-CM | POA: Diagnosis not present

## 2022-06-29 ENCOUNTER — Encounter: Payer: Self-pay | Admitting: Internal Medicine

## 2022-06-29 NOTE — Progress Notes (Unsigned)
Subjective:    Patient ID: Katherine Lawrence, female    DOB: [birth year], 67 y.o.   MRN: [id number]     HPI Katherine Lawrence is here for follow up of her chronic medical problems, including DM, htn, hld, Alzheimer's dz.  She is here today alone.  She is taking her medication daily.  She does not check her blood pressure.  She has been having a cough that sounds wet at times and she is able to bring up some sputum.  She does wheeze at times.  Initially she said she was short of breath, but I think she has more wheezing and not true shortness of breath.  Secondary to her memory issues she is a poor historian.   Medications and allergies reviewed with patient and updated if appropriate.  Current Outpatient Medications on File Prior to Visit  Medication Sig Dispense Refill   ACCU-CHEK GUIDE test strip USE UP TO FOUR TIMES DAILY (Patient not taking: Reported on 01/09/2022)     Accu-Chek Softclix Lancets lancets SMARTSIG:Topical 1 to 4 Times Daily (Patient not taking: Reported on 01/09/2022)     No current facility-administered medications on file prior to visit.     Review of Systems  Constitutional:  Negative for appetite change, chills and fever.  Respiratory:  Positive for cough (sounds wet - can be productive) and wheezing (at night). Negative for shortness of breath.   Cardiovascular:  Negative for chest pain, palpitations and leg swelling.  Gastrointestinal:        No gerd  Musculoskeletal:  Positive for arthralgias.  Neurological:  Positive for light-headedness. Negative for headaches.  Psychiatric/Behavioral:  Negative for dysphoric mood and sleep disturbance. The patient is not nervous/anxious.        Objective:   Vitals:   06/30/22 1111  BP: (!) 148/88  Pulse: 74  Temp: 98.1 F (36.7 C)  SpO2: 98%   BP Readings from Last 3 Encounters:  06/30/22 (!) 148/88  01/09/22 (!) 172/88  12/30/21 140/80   Wt Readings from Last 3 Encounters:  06/30/22 191 lb (86.6 kg)   01/09/22 192 lb (87.1 kg)  12/30/21 194 lb (88 kg)   Body mass index is 32.79 kg/m.    Physical Exam Constitutional:      General: She is not in acute distress.    Appearance: Normal appearance.  HENT:     Head: Normocephalic and atraumatic.  Eyes:     Conjunctiva/sclera: Conjunctivae normal.  Cardiovascular:     Rate and Rhythm: Normal rate and regular rhythm.     Heart sounds: Murmur (2/6 sys) heard.  Pulmonary:     Effort: Pulmonary effort is normal. No respiratory distress.     Breath sounds: Normal breath sounds. No wheezing.  Musculoskeletal:        General: Swelling (left knee) present.     Cervical back: Neck supple.     Right lower leg: No edema.     Left lower leg: No edema.  Lymphadenopathy:     Cervical: No cervical adenopathy.  Skin:    General: Skin is warm and dry.     Findings: No rash.  Neurological:     Mental Status: She is alert. Mental status is at baseline.  Psychiatric:        Mood and Affect: Mood normal.        Behavior: Behavior normal.        Lab Results  Component Value Date   WBC 7.8 11/18/2021  HGB 15.1 (H) 11/18/2021   HCT 45.6 11/18/2021   PLT 257.0 11/18/2021   GLUCOSE 102 (H) 01/01/2022   CHOL 221 (H) 01/01/2022   TRIG 178.0 (H) 01/01/2022   HDL 43.50 01/01/2022   LDLDIRECT 157.6 11/16/2012   LDLCALC 142 (H) 01/01/2022   ALT 9 01/01/2022   AST 13 01/01/2022   NA 143 01/01/2022   K 3.8 01/01/2022   CL 103 01/01/2022   CREATININE 0.59 01/01/2022   BUN 17 01/01/2022   CO2 30 01/01/2022   TSH 1.85 12/06/2020   INR 1.03 07/08/2018   HGBA1C 6.3 01/01/2022   MICROALBUR 15.9 (H) 11/18/2021     Assessment & Plan:    See Problem List for Assessment and Plan of chronic medical problems.

## 2022-06-29 NOTE — Patient Instructions (Addendum)
      Have a chest xray today    Blood work was ordered.   The lab is on the first floor.    Medications changes include :   start valsartan 320 mg daily and stop  losartan.  Albuterol inhaler as needed.     Return in about 2 months (around 08/29/2022) for hypertension.

## 2022-06-30 ENCOUNTER — Ambulatory Visit (INDEPENDENT_AMBULATORY_CARE_PROVIDER_SITE_OTHER): Payer: BC Managed Care – PPO | Admitting: Internal Medicine

## 2022-06-30 ENCOUNTER — Ambulatory Visit (INDEPENDENT_AMBULATORY_CARE_PROVIDER_SITE_OTHER): Payer: BC Managed Care – PPO

## 2022-06-30 VITALS — BP 148/88 | HR 74 | Temp 98.1°F | Ht 64.0 in | Wt 191.0 lb

## 2022-06-30 DIAGNOSIS — R059 Cough, unspecified: Secondary | ICD-10-CM

## 2022-06-30 DIAGNOSIS — E7800 Pure hypercholesterolemia, unspecified: Secondary | ICD-10-CM

## 2022-06-30 DIAGNOSIS — I10 Essential (primary) hypertension: Secondary | ICD-10-CM | POA: Diagnosis not present

## 2022-06-30 DIAGNOSIS — F0670 Mild neurocognitive disorder due to known physiological condition without behavioral disturbance: Secondary | ICD-10-CM

## 2022-06-30 DIAGNOSIS — G309 Alzheimer's disease, unspecified: Secondary | ICD-10-CM

## 2022-06-30 DIAGNOSIS — E119 Type 2 diabetes mellitus without complications: Secondary | ICD-10-CM

## 2022-06-30 LAB — LIPID PANEL
Cholesterol: 296 mg/dL — ABNORMAL HIGH (ref 0–200)
HDL: 51.1 mg/dL (ref >39.00)
LDL Cholesterol: 207 mg/dL — ABNORMAL HIGH (ref 0–99)
NonHDL: 245.13
Total CHOL/HDL Ratio: 6
Triglycerides: 190 mg/dL — ABNORMAL HIGH (ref 0.0–149.0)
VLDL: 38 mg/dL (ref 0.0–40.0)

## 2022-06-30 LAB — COMPREHENSIVE METABOLIC PANEL
ALT: 13 U/L (ref 0–35)
AST: 17 U/L (ref 0–37)
Albumin: 4.6 g/dL (ref 3.5–5.2)
Alkaline Phosphatase: 50 U/L (ref 39–117)
BUN: 15 mg/dL (ref 6–23)
CO2: 30 mEq/L (ref 19–32)
Calcium: 9.8 mg/dL (ref 8.4–10.5)
Chloride: 102 mEq/L (ref 96–112)
Creatinine, Ser: 0.82 mg/dL (ref 0.40–1.20)
GFR: 75.61 mL/min (ref >60.00)
Glucose, Bld: 106 mg/dL — ABNORMAL HIGH (ref 70–99)
Potassium: 4.1 mEq/L (ref 3.5–5.1)
Sodium: 141 mEq/L (ref 135–145)
Total Bilirubin: 0.7 mg/dL (ref 0.2–1.2)
Total Protein: 7.4 g/dL (ref 6.0–8.3)

## 2022-06-30 LAB — HEMOGLOBIN A1C: Hgb A1c MFr Bld: 6.5 % (ref 4.6–6.5)

## 2022-06-30 MED ORDER — ALBUTEROL SULFATE HFA 108 (90 BASE) MCG/ACT IN AERS: 2.0000 | INHALATION_SPRAY | Freq: Four times a day (QID) | RESPIRATORY_TRACT | 0 refills | Status: DC | PRN

## 2022-06-30 MED ORDER — AMLODIPINE BESYLATE 5 MG PO TABS: ORAL_TABLET | ORAL | 2 refills | Status: DC

## 2022-06-30 MED ORDER — DONEPEZIL HCL 10 MG PO TABS: ORAL_TABLET | ORAL | 11 refills | Status: DC

## 2022-06-30 MED ORDER — VALSARTAN 320 MG PO TABS: 320.0000 mg | ORAL_TABLET | Freq: Every day | ORAL | 3 refills | Status: DC

## 2022-06-30 MED ORDER — ATORVASTATIN CALCIUM 40 MG PO TABS: 40.0000 mg | ORAL_TABLET | Freq: Every day | ORAL | 3 refills | Status: DC

## 2022-06-30 MED ORDER — AMOXICILLIN-POT CLAVULANATE 875-125 MG PO TABS: 1.0000 | ORAL_TABLET | Freq: Two times a day (BID) | ORAL | 0 refills | Status: DC

## 2022-06-30 MED ORDER — METFORMIN HCL 500 MG PO TABS: ORAL_TABLET | ORAL | 1 refills | Status: DC

## 2022-06-30 NOTE — Assessment & Plan Note (Signed)
Chronic Regular exercise and healthy diet encouraged Check lipid panel  Continue atorvastatin 40 mg daily 

## 2022-06-30 NOTE — Assessment & Plan Note (Signed)
Chronic BP not controlled Continue amlodipine 5 mg daily Change losartan 50 mg daily to valsartan 320 mg daily cmp

## 2022-06-30 NOTE — Assessment & Plan Note (Signed)
Chronic Chronic with neurology-has upcoming appointment with neurology and neuropsychology Continue Aricept 10 mg daily-change prescription to specify it was for memory to help her keep track of her medications

## 2022-06-30 NOTE — Assessment & Plan Note (Signed)
Chronic   Lab Results  Component Value Date   HGBA1C 6.3 01/01/2022   Sugars controlled Testing sugars 0 times a day Check A1c, urine microalbumin today Continue metformin 500 mg twice daily Stressed  diabetic diet

## 2022-06-30 NOTE — Assessment & Plan Note (Addendum)
Acute Cough that is wet at times and wheezing No obvious shortness of breath although she is a poor historian Hard to say how long this has been going on Chest x-ray today-depending on results will prescribe an antibiotic, if not may just need to start Mucinex and monitor

## 2022-07-14 ENCOUNTER — Encounter: Payer: Self-pay | Admitting: Physician Assistant

## 2022-07-14 ENCOUNTER — Ambulatory Visit: Payer: BC Managed Care – PPO | Admitting: Physician Assistant

## 2022-07-14 DIAGNOSIS — Z029 Encounter for administrative examinations, unspecified: Secondary | ICD-10-CM

## 2022-07-14 NOTE — Progress Notes (Incomplete)
Assessment/Plan:   Mild cognitive impairment likely due to Alzheimer's disease  Katherine Lawrence is a very pleasant 67 y.o. 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 Patient is on donepezil 10 mg daily, tolerating well.***  Personally reviewed of the brain 12/03/2021 remarkable for redemonstrated chronic lacunar infarct within the left corona radiata / basal ganglia, stable mild cerebral white matter chronic small vessel ischemic disease, frontal lobe predominant cerebral atrophy.***   Recommendations:   Follow up in   months. Continue donepezil 10 mg daily, side effects discussed. Continue CBT for situational depression Patient is scheduled for repeat neuropsych evaluation on 11/12/2022 for clarity of the diagnosis and disease trajectory    Subjective:   This patient is accompanied in the office by ***  who supplements the history. Previous records as well as any outside records available were reviewed prior to todays visit.   Patient was last seen on 01/10/2022.  Last MMSE was 26/30 on January 2023.***    Any changes in memory since last visit?  She may have some memory lapses in which she does not recall a conversation, order instances in which she cannot find the right word provoking some level of frustration.  As before, she may have some decreased attention and concentration issues.*** repeats oneself?  Endorsed Disoriented when walking into a room?  Patient denies except occasionally not remembering what patient came to the room for ***  Leaving objects in unusual places?  Patient denies   Wandering behavior?   denies   Any personality changes since last visit?  As before, if she receives junk mail or telemarketing calls she may become more irritable.  She  becomes melancholic at times when people do not come to visit her.*** Any worsening depression?: denies   Hallucinations or paranoia?  denies   Seizures?   denies    Any sleep changes?  Denies  vivid dreams, REM behavior or sleepwalking   Sleep apnea?   denies   Any hygiene concerns?   denies   Independent of bathing and dressing?  Endorsed  Does the patient needs help with medications? is in charge *** Who is in charge of the finances?   is in charge   *** Any changes in appetite?  denies ***   Patient have trouble swallowing?  denies   Does the patient cook?  Any kitchen accidents such as leaving the stove on?   denies   Any headaches?    denies   Vision changes? denies Chronic back pain  denies   Ambulates with difficulty?   She has chronic kidney issues especially on the left, which causes discomfort.  She admits to not being very active*** Recent falls or head injuries?    denies     Unilateral weakness, numbness or tingling?   denies   Any tremors?  denies   Any anosmia?    denies   Any incontinence of urine?  Endorsed, she uses depends Any bowel dysfunction?  denies      Patient lives  ***with her husband Does the patient drive?***     Neuropsychological evaluation 03/12/21 :  results suggested severe impairment surrounding all aspects of verbal learning and memory. Additional impairments were exhibited across confrontation naming, verbal fluency (semantic worse than phonemic), and executive functioning (including a task assessing safety and judgment).  Regarding etiology, despite Ms. Feuerborn's young age, I unfortunately do have concerns surrounding Alzheimer's disease. She demonstrated prominent verbal memory dysfunction with evidence to suggest a deficit with information storage. She also demonstrated impairments in confrontation naming, semantic fluency, and executive functioning. Overall, this does represent a fairly classic pattern of dysfunction in earlier stages of this disease  process. 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 However, upon review of these images, possible areas represented do not involve the hippocampus or other primary memory centers of the brain. As such, while there may be a mild vascular contribution to her current presentation, a vascular etiology is unlikely to fully account for memory impairment or other areas of dysfunction.   [facility] MRI on 04/23/2015 revealed age-advanced cerebral atrophy, most notable in the frontal regions. Brain MRI on 03/23/2019 revealed mild frontal atrophy bilaterally, as well as small deep white matter infarcts in the left hemisphere.  Negative for acute infarct. Mild chronic ischemia left cerebral white matter has developed since 2016. Mild frontal atrophy.     History of Present Illness 04/21/2019:  The patient had a telephone visit on 04/21/2019. She was last seen in the neurology clinic 10 months ago for myalgias. EMG/NCV did not show any evidence of myopathy, bloodwork no evidence of inflammation. She was seen in 2016 for memory changes. MRI brain in 04/2015 unremarkable. She reports that since her last visit, she is noticing more cognitive changes. She has noticed more hesitation during conversations, she would have to stop and think of what she was going to say. She was laid off in October and offered early retirement, she noticed toward the end that she had to write down everything she was going to say if she had to speak on conference calls. She would go blank when called on to discuss something. She has gotten lost driving, unable to recall how to get to places she has been to for years. She has left the stove on. She infrequently forgets medications. She denies missing bill payments. Sleep and mood are good. She had a knee replacement with continued pain. She denies any headaches, dizziness, vision changes, focal numbness/tingling/weakness. She is not having as much myalgias as before. She had a  repeat MRI brain without contrast done 03/2019 did not show any acute changes. 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       Past Medical History:  Diagnosis Date   Allergy    CIN I (cervical intraepithelial neoplasia I) 1996   cryo...  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   Osteoarthritis of left hip 05/10/2017   Osteoarthritis of left knee    Pending L TKR fall 2015 - [facility]  Palpitations 08/05/2019   Pneumonia 06/2003   Postsurgical menopause    Pure hypercholesterolemia 04/03/2008   S/P total knee replacement using cement 02/14/2014   Type 2 diabetes mellitus without complication, without long-term current use of insulin 04/13/2015   Dr Katy Fitch     Past Surgical History:  Procedure Laterality Date   ABDOMINAL HYSTERECTOMY  08/22/2011   CERVICAL CONE BIOPSY  1996   CESAREAN SECTION     x1   COLONOSCOPY W/ POLYPECTOMY  12/2008   x3   DILATION AND CURETTAGE OF UTERUS     KNEE ARTHROTOMY Left 08/15/2019   Procedure: Left knee arthrotomy; scar excision;  Surgeon: Gaynelle Arabian, MD;  Location: [facility];  Service: Orthopedics;  Laterality: Left;  76mn   TONSILLECTOMY     TOTAL HIP ARTHROPLASTY Right  2008   TOTAL HIP ARTHROPLASTY Left 05/20/2017   Procedure: LEFT TOTAL HIP ARTHROPLASTY ANTERIOR APPROACH;  Surgeon: AGaynelle Arabian MD;  Location: [facility];  Service: Orthopedics;  Laterality: Left;   TOTAL KNEE ARTHROPLASTY Left 02/14/2014   DR WDurward Fortes  TOTAL KNEE ARTHROPLASTY Left 02/14/2014   Procedure: TOTAL KNEE ARTHROPLASTY;  Surgeon: PGarald Balding MD;  Location: [facility]  Service: Orthopedics;  Laterality: Left;   TOTAL KNEE REVISION Left 07/14/2018   Procedure: TOTAL KNEE REVISION;  Surgeon: AGaynelle Arabian MD;  Location: [facility];  Service: Orthopedics;  Laterality: Left;  Adductor Block     PREVIOUS MEDICATIONS:   CURRENT MEDICATIONS:  Outpatient Encounter Medications as of 07/14/2022  Medication Sig   ACCU-CHEK GUIDE test strip USE UP TO FOUR TIMES DAILY (Patient not taking: Reported on 01/09/2022)   Accu-Chek Softclix Lancets lancets SMARTSIG:Topical 1 to 4 Times Daily (Patient not taking: Reported on 01/09/2022)   albuterol (VENTOLIN HFA) 108 (90 Base) MCG/ACT inhaler Inhale 2 puffs into the lungs every 6 (six) hours as needed for wheezing or shortness of breath.   amLODipine (NORVASC) 5 MG tablet TAKE 1 TABLET(5 MG) BY MOUTH DAILY FOR BLOOD PRESSURE   amoxicillin-clavulanate (AUGMENTIN) 875-125 MG tablet Take 1 tablet by mouth 2 (two) times daily.   atorvastatin (LIPITOR) 40 MG tablet Take 1 tablet (40 mg total) by mouth daily. For cholesterol   donepezil (ARICEPT) 10 MG tablet TAKE  1 TABLET AT 10 MG DAILY. For Memory   metFORMIN (GLUCOPHAGE) 500 MG tablet TAKE 1 TABLET(500 MG) BY MOUTH TWICE DAILY WITH A MEAL FOR DIABETES   valsartan (DIOVAN) 320 MG tablet Take 1 tablet (320 mg total) by mouth daily.   No facility-administered encounter medications on file as of 07/14/2022.     Objective:     PHYSICAL EXAMINATION:    VITALS:  There were no vitals filed for this visit.  GEN:  The patient appears stated age and is in NAD. HEENT:  Normocephalic, atraumatic.    Neurological examination:  General: NAD, well-groomed, appears stated age. Orientation: The patient is alert. Oriented to person, place and date Cranial nerves: There is good facial symmetry.The speech is fluent and clear. No aphasia or dysarthria. Fund of knowledge is appropriate. Recent memory impaired and remote memory is normal.  Attention and concentration are normal.  Able to name objects and repeat phrases.  Hearing is intact to conversational tone.   Delayed recall *** Sensation: Sensation is intact to light touch throughout Motor: Strength is at least antigravity x4. Tremors: none  DTR's 2/4 in UE/LE      06/21/2018    9:00 AM 04/13/2015    1:20 PM  [facility]  Cognitive Assessment   Visuospatial/ Executive (0/5) 4 3  Naming (0/3) 3 3  Attention: Read list of digits (0/2) 2 2  Attention: Read list of letters (0/1) 1 1  Attention: Serial 7 subtraction starting at 100 (0/3) 3 3  Language: Repeat phrase (0/2) 2 2  Language : Fluency (0/1) 0 0  Abstraction (0/2) 2 2  Delayed Recall (0/5) 0 2  Orientation (0/6) 6 6  Total 23 24  Adjusted Score (based on education)  24       07/11/2021    3:00 PM  MMSE - Mini Mental State Exam  Orientation to time 4  Orientation to Place 5  Registration 3  Attention/ Calculation 5  Recall 1  Language- name 2 objects 2  Language- repeat 1  Language- follow 3 step command 3  Language- read & follow direction 1  Write a sentence 1  Copy design 0  Total score 26       Movement examination: Tone: There is normal tone in the UE/LE Abnormal movements:  no tremor.  No myoclonus.  No asterixis.   Coordination:  There is no decremation with RAM's. Normal finger to nose  Gait and Station: The patient has no difficulty arising out of a deep-seated chair without the use of the hands. The patient's stride length is good.  Gait is cautious and narrow.   Thank you for allowing [place] the opportunity to participate in the care of this nice patient.  Please do not hesitate to contact us for any questions or concerns.   Total time spent on today's visit was *** minutes dedicated to this patient today, preparing to see patient, examining the patient, ordering tests and/or medications and counseling the patient, documenting clinical information in the EHR or other health record, independently interpreting results and communicating results to the patient/family, discussing treatment and goals, answering patient's questions and coordinating care.  Cc:  Binnie Rail, MD  Sharene Butters 07/14/2022 7:02 AM

## 2022-07-22 ENCOUNTER — Encounter: Payer: Self-pay | Admitting: Physician Assistant

## 2022-07-22 ENCOUNTER — Ambulatory Visit (INDEPENDENT_AMBULATORY_CARE_PROVIDER_SITE_OTHER): Payer: BC Managed Care – PPO | Admitting: Physician Assistant

## 2022-07-22 VITALS — BP 189/86 | HR 83 | Resp 18 | Ht 64.0 in | Wt 197.0 lb

## 2022-07-22 DIAGNOSIS — F0670 Mild neurocognitive disorder due to known physiological condition without behavioral disturbance: Secondary | ICD-10-CM | POA: Diagnosis not present

## 2022-07-22 DIAGNOSIS — G309 Alzheimer's disease, unspecified: Secondary | ICD-10-CM | POA: Diagnosis not present

## 2022-07-22 NOTE — Patient Instructions (Signed)
It was a pleasure to see you today at our office.   Recommendations:  Meds: Follow up in 6 months Continue Donepezil 10 mg daily.  Side effects discussed  Repeat neuropsychological evaluation    RECOMMENDATIONS FOR ALL PATIENTS WITH MEMORY PROBLEMS: 1. Continue to exercise (Recommend 30 minutes of walking everyday, or 3 hours every week) 2. Increase social interactions - continue going to [place] and enjoy social gatherings with friends and family 3. Eat healthy, avoid fried foods and eat more fruits and vegetables 4. Maintain adequate blood pressure, blood sugar, and blood cholesterol level. Reducing the risk of stroke and cardiovascular disease also helps promoting better memory. 5. Avoid stressful situations. Live a simple life and avoid aggravations. Organize your time and prepare for the next day in anticipation. 6. Sleep well, avoid any interruptions of sleep and avoid any distractions in the bedroom that may interfere with adequate sleep quality 7. Avoid sugar, avoid sweets as there is a strong link between excessive sugar intake, diabetes, and cognitive impairment We discussed the Mediterranean diet, which has been shown to help patients reduce the risk of progressive memory disorders and reduces cardiovascular risk. This includes eating fish, eat fruits and green leafy vegetables, nuts like almonds and hazelnuts, walnuts, and also use olive oil. Avoid fast foods and fried foods as much as possible. Avoid sweets and sugar as sugar use has been linked to worsening of memory function.  There is always a concern of gradual progression of memory problems. If this is the case, then we may need to adjust level of care according to patient needs. Support, both to the patient and caregiver, should then be put into place.    The Alzheimer's Association is here all day, every day for people facing Alzheimer's disease through our free 24/7 Helpline: [phone]. The Helpline provides reliable  information and support to all those who need assistance, such as individuals living with memory loss, Alzheimer's or other dementia, caregivers, health care professionals and the public.  Our highly trained and knowledgeable staff can help you with: Understanding memory loss, dementia and Alzheimer's  Medications and other treatment options  General information about aging and brain health  Skills to provide quality care and to find the best care from professionals  Legal, financial and living-arrangement decisions Our Helpline also features: Confidential care consultation provided by master's level clinicians who can help with decision-making support, crisis assistance and education on issues families face every day  Help in a caller's preferred language using our translation service that features more than 200 languages and dialects  Referrals to local community programs, services and ongoing support     FALL PRECAUTIONS: Be cautious when walking. Scan the area for obstacles that may increase the risk of trips and falls. When getting up in the mornings, sit up at the edge of the bed for a few minutes before getting out of bed. Consider elevating the bed at the head end to avoid drop of blood pressure when getting up. Walk always in a well-lit room (use night lights in the walls). Avoid area rugs or power cords from appliances in the middle of the walkways. Use a walker or a cane if necessary and consider physical therapy for balance exercise. Get your eyesight checked regularly.  FINANCIAL OVERSIGHT: Supervision, especially oversight when making financial decisions or transactions is also recommended.  HOME SAFETY: Consider the safety of the kitchen when operating appliances like stoves, microwave oven, and blender. Consider having supervision and share cooking responsibilities  until no longer able to participate in those. Accidents with firearms and other hazards in the house should be  identified and addressed as well.   ABILITY TO BE LEFT ALONE: If patient is unable to contact 911 operator, consider using LifeLine, or when the need is there, arrange for someone to stay with patients. Smoking is a fire hazard, consider supervision or cessation. Risk of wandering should be assessed by caregiver and if detected at any point, supervision and safe proof recommendations should be instituted.  MEDICATION SUPERVISION: Inability to self-administer medication needs to be constantly addressed. Implement a mechanism to ensure safe administration of the medications.   DRIVING: Regarding driving, in patients with progressive memory problems, driving will be impaired. We advise to have someone else do the driving if trouble finding directions or if minor accidents are reported. Independent driving assessment is available to determine safety of driving.   If you are interested in the driving assessment, you can contact the following:  [employer] in [place]  [place] [phone] or [phone]      [place] refers to food and lifestyle choices that are based on the traditions of countries located on the [employer]. This way of eating has been shown to help prevent certain conditions and improve outcomes for people who have chronic diseases, like kidney disease and heart disease. What are tips for following this plan? Lifestyle  Cook and eat meals together with your family, when possible. Drink enough fluid to keep your urine clear or pale yellow. Be physically active every day. This includes: Aerobic exercise like running or swimming. Leisure activities like gardening, walking, or housework. Get 7-8 hours of sleep each night. If recommended by your health care provider, drink red wine in moderation. This means 1 glass a day  for nonpregnant women and 2 glasses a day for men. A glass of wine equals 5 oz (150 mL). Reading food labels  Check the serving size of packaged foods. For foods such as rice and pasta, the serving size refers to the amount of cooked product, not dry. Check the total fat in packaged foods. Avoid foods that have saturated fat or trans fats. Check the ingredients list for added sugars, such as corn syrup. Shopping  At the grocery store, buy most of your food from the areas near the walls of the store. This includes: Fresh fruits and vegetables (produce). Grains, beans, nuts, and seeds. Some of these may be available in unpackaged forms or large amounts (in bulk). Fresh seafood. Poultry and eggs. Low-fat dairy products. Buy whole ingredients instead of prepackaged foods. Buy fresh fruits and vegetables in-season from local farmers markets. Buy frozen fruits and vegetables in resealable bags. If you do not have access to quality fresh seafood, buy precooked frozen shrimp or canned fish, such as tuna, salmon, or sardines. Buy small amounts of raw or cooked vegetables, salads, or olives from the deli or salad bar at your store. Stock your pantry so you always have certain foods on hand, such as olive oil, canned tuna, canned tomatoes, rice, pasta, and beans. Cooking  Cook foods with extra-virgin olive oil instead of using butter or other vegetable oils. Have meat as a side dish, and have vegetables or grains as your main dish. This means having meat in small portions or adding small amounts of meat to foods like pasta or stew. Use beans or vegetables instead  of meat in common dishes like chili or lasagna. Experiment with different cooking methods. Try roasting or broiling vegetables instead of steaming or sauteing them. Add frozen vegetables to soups, stews, pasta, or rice. Add nuts or seeds for added healthy fat at each meal. You can add these to yogurt, salads, or vegetable dishes. Marinate  fish or vegetables using olive oil, lemon juice, garlic, and fresh herbs. Meal planning  Plan to eat 1 vegetarian meal one day each week. Try to work up to 2 vegetarian meals, if possible. Eat seafood 2 or more times a week. Have healthy snacks readily available, such as: Vegetable sticks with hummus. Greek yogurt. Fruit and nut trail mix. Eat balanced meals throughout the week. This includes: Fruit: 2-3 servings a day Vegetables: 4-5 servings a day Low-fat dairy: 2 servings a day Fish, poultry, or lean meat: 1 serving a day Beans and legumes: 2 or more servings a week Nuts and seeds: 1-2 servings a day Whole grains: 6-8 servings a day Extra-virgin olive oil: 3-4 servings a day Limit red meat and sweets to only a few servings a month What are my food choices? Mediterranean diet Recommended Grains: Whole-grain pasta. Brown rice. Bulgar wheat. Polenta. Couscous. Whole-wheat bread. [place]. Vegetables: Artichokes. Beets. Broccoli. Cabbage. Carrots. Eggplant. Green beans. Chard. Kale. Spinach. Onions. Leeks. Peas. Squash. Tomatoes. Peppers. Radishes. Fruits: Apples. Apricots. Avocado. Berries. Bananas. Cherries. Dates. Figs. Grapes. Lemons. Melon. Oranges. Peaches. Plums. Pomegranate. Meats and other protein foods: Beans. Almonds. Sunflower seeds. Pine nuts. Peanuts. [place]. Salmon. Scallops. Shrimp. [place]. Tilapia. Clams. Oysters. Eggs. Dairy: Low-fat milk. Cheese. Greek yogurt. Beverages: Water. Red wine. Herbal tea. Fats and oils: Extra virgin olive oil. Avocado oil. Grape seed oil. Sweets and desserts: [place] yogurt with honey. Baked apples. Poached pears. Trail mix. Seasoning and other foods: Basil. Cilantro. Coriander. Cumin. Mint. Parsley. Sage. Rosemary. Tarragon. Garlic. Oregano. Thyme. Pepper. Balsalmic vinegar. Tahini. Hummus. Tomato sauce. Olives. Mushrooms. Limit these Grains: Prepackaged pasta or rice dishes. Prepackaged cereal with added sugar. Vegetables: Deep fried  potatoes (french fries). Fruits: Fruit canned in syrup. Meats and other protein foods: Beef. Pork. Lamb. Poultry with skin. Hot dogs. [place] Salines. Dairy: Ice cream. Sour cream. Whole milk. Beverages: Juice. Sugar-sweetened soft drinks. Beer. Liquor and spirits. Fats and oils: Butter. Canola oil. Vegetable oil. Beef fat (tallow). Lard. Sweets and desserts: Cookies. Cakes. Pies. Candy. Seasoning and other foods: Mayonnaise. Premade sauces and marinades. The items listed may not be a complete list. Talk with your dietitian about what dietary choices are right for you. Summary The Mediterranean diet includes both food and lifestyle choices. Eat a variety of fresh fruits and vegetables, beans, nuts, seeds, and whole grains. Limit the amount of red meat and sweets that you eat. Talk with your health care provider about whether it is safe for you to drink red wine in moderation. This means 1 glass a day for nonpregnant women and 2 glasses a day for men. A glass of wine equals 5 oz (150 mL). This information is not intended to replace advice given to you by your health care provider. Make sure you discuss any questions you have with your health care provider. Document Released: 01/24/2016 Document Revised: 02/26/2016 Document Reviewed: 01/24/2016 Elsevier Interactive Patient Education  2017 [employer].

## 2022-07-22 NOTE — Progress Notes (Signed)
Assessment/Plan:   Mild cognitive impairment likely due to Alzheimer's disease  Katherine Lawrence is a very pleasant 67 y.o. 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 Patient is on donepezil 10 mg daily, tolerating well.***  Personally reviewed of the brain 12/03/2021 remarkable for redemonstrated chronic lacunar infarct within the left corona radiata / basal ganglia, stable mild cerebral white matter chronic small vessel ischemic disease, frontal lobe predominant cerebral atrophy.***   Recommendations:   Follow up in   months. Continue donepezil 10 mg daily, side effects discussed. Patient is scheduled for repeat neuropsych evaluation on 11/12/2022 for clarity of the diagnosis and disease trajectory    Subjective:   This patient is accompanied in the office by ***  who supplements the history. Previous records as well as any outside records available were reviewed prior to todays visit.   Patient was last seen on 01/10/2022.  Last MMSE was 26/30 on January 2023.***    Any changes in memory since last visit?  She may have some memory lapses in which she does not recall a conversation, order instances in which she cannot find the right word provoking some level of frustration.  As before, she may have some decreased attention and concentration issues.*** repeats oneself?  Endorsed, but husband says yes  Disoriented when walking into a room?  Patient denies  Leaving objects in unusual places?  Patient denies car keys are lost  in the house, can't find them   Wandering behavior?   denies   Any personality changes since last visit?  As before, if she receives junk mail or telemarketing calls she may become more irritable.  She becomes melancholic at times when people do not  come to visit her from out of town.*** Any worsening depression?: denies   Hallucinations or paranoia?  She sees something in the corner of my left eye.  She sees snakes sometimes, or some person talking to her  Seizures?   denies    Any sleep changes?  Denies  vivid dreams, REM behavior or sleepwalking   Sleep apnea?   denies   Any hygiene concerns?   denies   Independent of bathing and dressing?  Endorsed  Does the patient needs help with medications? Patient is in charge *** Who is in charge of the finances? Husband  is in charge   *** Any changes in appetite?  denies ***   Patient have trouble swallowing?  denies   Does the patient cook?  Any kitchen accidents such as leaving the stove on?   denies   Any headaches?    denies   Vision changes? denies Chronic back pain  denies   Ambulates with difficulty?   She has chronic kidney issues especially on the left, which causes discomfort.  She admits to not being very active*** Recent falls or head injuries?    denies     Unilateral weakness, numbness or tingling?   denies   Any tremors?  denies   Any anosmia?    denies   Any incontinence of urine?  Endorsed, she uses Depends Any bowel dysfunction?  denies      Patient lives  with her husband Does the patient drive? Short distances. No issues      Neuropsychological evaluation 03/12/21 :  results suggested severe impairment surrounding all aspects  of verbal learning and memory. Additional impairments were exhibited across confrontation naming, verbal fluency (semantic worse than phonemic), and executive functioning (including a task assessing safety and judgment). Regarding etiology, despite Ms. Audi's young age, I unfortunately do have concerns surrounding Alzheimer's disease. She demonstrated prominent verbal memory dysfunction with evidence to suggest a deficit with information storage. She also demonstrated impairments in confrontation naming, semantic fluency, and executive functioning.  Overall, this does represent a fairly classic pattern of dysfunction in earlier stages of this disease process. 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 However, upon review of these images, possible areas represented do not involve the hippocampus or other primary memory centers of the brain. As such, while there may be a mild vascular contribution to her current presentation, a vascular etiology is unlikely to fully account for memory impairment or other areas of dysfunction.   [facility] MRI on 04/23/2015 revealed age-advanced cerebral atrophy, most notable in the frontal regions. Brain MRI on 03/23/2019 revealed mild frontal atrophy bilaterally, as well as small deep white matter infarcts in the left hemisphere.  Negative for acute infarct. Mild chronic ischemia left cerebral white matter has developed since 2016. Mild frontal atrophy.     History of Present Illness 04/21/2019:  The patient had a telephone visit on 04/21/2019. She was last seen in the neurology clinic 10 months ago for myalgias. EMG/NCV did not show any evidence of myopathy, bloodwork no evidence of inflammation. She was seen in 2016 for memory changes. MRI brain in 04/2015 unremarkable. She reports that since her last visit, she is noticing more cognitive changes. She has noticed more hesitation during conversations, she would have to stop and think of what she was going to say. She was laid off in October and offered early retirement, she noticed toward the end that she had to write down everything she was going to say if she had to speak on conference calls. She would go blank when called on to discuss something. She has gotten lost driving, unable to recall how to get to places she has been to for years. She has left the stove on. She infrequently forgets medications. She denies missing bill payments. Sleep and mood are good. She had a knee replacement with continued pain. She denies any headaches, dizziness, vision  changes, focal numbness/tingling/weakness. She is not having as much myalgias as before. She had a repeat MRI brain without contrast done 03/2019 did not show any acute changes. 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       Past Medical History:  Diagnosis Date   Allergy    CIN I (cervical intraepithelial neoplasia I) 1996   cryo...  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   Osteoarthritis of left hip 05/10/2017   Osteoarthritis of left knee    Pending L TKR fall 2015 - [facility]   Palpitations 08/05/2019   Pneumonia 06/2003   Postsurgical menopause    Pure hypercholesterolemia 04/03/2008   S/P total knee replacement using cement 02/14/2014   Type 2 diabetes mellitus without complication, without long-term current use of insulin 04/13/2015   Dr Katy Fitch     Past Surgical History:  Procedure Laterality Date   ABDOMINAL HYSTERECTOMY  08/22/2011   CERVICAL CONE BIOPSY  1996   CESAREAN SECTION     x1   COLONOSCOPY W/ POLYPECTOMY  12/2008   x3   DILATION AND CURETTAGE OF UTERUS     KNEE ARTHROTOMY Left 08/15/2019   Procedure: Left knee arthrotomy; scar excision;  Surgeon: Gaynelle Arabian, MD;  Location: [facility];   Service: Orthopedics;  Laterality: Left;  22mn   TONSILLECTOMY     TOTAL HIP ARTHROPLASTY Right 2008   TOTAL HIP ARTHROPLASTY Left 05/20/2017   Procedure: LEFT TOTAL HIP ARTHROPLASTY ANTERIOR APPROACH;  Surgeon: AGaynelle Arabian MD;  Location: [facility];  Service: Orthopedics;  Laterality: Left;   TOTAL KNEE ARTHROPLASTY Left 02/14/2014   DR WDurward Fortes  TOTAL KNEE ARTHROPLASTY Left 02/14/2014   Procedure: TOTAL KNEE ARTHROPLASTY;  Surgeon: PGarald Balding MD;  [place] [facility]  Service: Orthopedics;  Laterality: Left;   TOTAL KNEE REVISION Left 07/14/2018   Procedure: TOTAL KNEE REVISION;  Surgeon: AGaynelle Arabian MD;  Location: [facility];  Service: Orthopedics;  Laterality: Left;  Adductor Block     PREVIOUS MEDICATIONS:   CURRENT MEDICATIONS:  Outpatient Encounter Medications as of 07/22/2022  Medication Sig   ACCU-CHEK GUIDE test strip USE UP TO FOUR TIMES DAILY (Patient not taking: Reported on 01/09/2022)   Accu-Chek Softclix Lancets lancets SMARTSIG:Topical 1 to 4 Times Daily (Patient not taking: Reported on 01/09/2022)   albuterol (VENTOLIN HFA) 108 (90 Base) MCG/ACT inhaler Inhale 2 puffs into the lungs every 6 (six) hours as needed for wheezing or shortness of breath.   amLODipine (NORVASC) 5 MG tablet TAKE 1 TABLET(5 MG) BY MOUTH DAILY FOR BLOOD PRESSURE   amoxicillin-clavulanate (AUGMENTIN) 875-125 MG tablet Take 1 tablet by mouth 2 (two) times daily.   atorvastatin (LIPITOR) 40 MG tablet Take 1 tablet (40 mg total) by mouth daily. For cholesterol   donepezil (ARICEPT) 10 MG tablet TAKE  1 TABLET AT 10 MG DAILY. For Memory   metFORMIN (GLUCOPHAGE) 500 MG tablet TAKE 1 TABLET(500 MG) BY MOUTH TWICE DAILY WITH A MEAL FOR DIABETES   valsartan (DIOVAN) 320 MG tablet Take 1 tablet (320 mg total) by mouth daily.   No facility-administered encounter medications on file as of 07/22/2022.     Objective:     PHYSICAL EXAMINATION:    VITALS:  There were no vitals filed for this visit.  GEN:   The patient appears stated age and is in NAD. HEENT:  Normocephalic, atraumatic.   Neurological examination:  General: NAD, well-groomed, appears stated age. Orientation: The patient is alert. Oriented to person, place and date Cranial nerves: There is good facial symmetry.The speech is fluent and clear. No aphasia or dysarthria. Fund of knowledge is appropriate. Recent memory impaired and remote memory is normal.  Attention and concentration are normal.  Able to name objects and repeat phrases.  Hearing is intact to conversational tone.   Delayed recall *** Sensation: Sensation is intact to light touch throughout Motor: Strength is at  least antigravity x4. Tremors: none  DTR's 2/4 in UE/LE      06/21/2018    9:00 AM 04/13/2015    1:20 PM  Montreal Cognitive Assessment   Visuospatial/ Executive (0/5) 4 3  Naming (0/3) 3 3  Attention: Read list of digits (0/2) 2 2  Attention: Read list of letters (0/1) 1 1  Attention: Serial 7 subtraction starting at 100 (0/3) 3 3  Language: Repeat phrase (0/2) 2 2  Language : Fluency (0/1) 0 0  Abstraction (0/2) 2 2  Delayed Recall (0/5) 0 2  Orientation (0/6) 6 6  Total 23 24  Adjusted Score (based on education)  24       07/11/2021    3:00 PM  MMSE - Mini Mental State Exam  Orientation to time 4  Orientation to Place 5  Registration 3  Attention/ Calculation 5  Recall 1  Language- name 2 objects 2  Language- repeat 1  Language- follow 3 step command 3  Language- read & follow direction 1  Write a sentence 1  Copy design 0  Total score 26       Movement examination: Tone: There is normal tone in the UE/LE Abnormal movements:  no tremor.  No myoclonus.  No asterixis.   Coordination:  There is no decremation with RAM's. Normal finger to nose  Gait and Station: The patient has no difficulty arising out of a deep-seated chair without the use of the hands. The patient's stride length is good.  Gait is cautious and narrow.   Thank you  for allowing [place] the opportunity to participate in the care of this nice patient. Please do not hesitate to contact us for any questions or concerns.   Total time spent on today's visit was *** minutes dedicated to this patient today, preparing to see patient, examining the patient, ordering tests and/or medications and counseling the patient, documenting clinical information in the EHR or other health record, independently interpreting results and communicating results to the patient/family, discussing treatment and goals, answering patient's questions and coordinating care.  Cc:  Binnie Rail, MD  Sharene Butters 07/22/2022 7:14 AM

## 2022-07-24 ENCOUNTER — Ambulatory Visit: Payer: BC Managed Care – PPO

## 2022-08-24 NOTE — Progress Notes (Signed)
Subjective:    Patient ID: Katherine Lawrence, female    DOB: December 16, 1955, 67 y.o.   MRN: [id number]     HPI Katherine Lawrence is here for follow up of her chronic medical problems.  She is here alone today.  She has no concerns.  When asked if she is taking her blood pressure medication she is on sure, but thinks she is taking everything she supposed to be.  She does not monitor her sugars or blood pressure at home.  Medications and allergies reviewed with patient and updated if appropriate.  Current Outpatient Medications on File Prior to Visit  Medication Sig Dispense Refill   amLODipine (NORVASC) 5 MG tablet TAKE 1 TABLET(5 MG) BY MOUTH DAILY FOR BLOOD PRESSURE 90 tablet 2   donepezil (ARICEPT) 10 MG tablet TAKE  1 TABLET AT 10 MG DAILY. For Memory 30 tablet 11   valsartan (DIOVAN) 320 MG tablet Take 1 tablet (320 mg total) by mouth daily. 90 tablet 3   No current facility-administered medications on file prior to visit.     Review of Systems  Constitutional:  Negative for fever.  Respiratory:  Positive for cough (mostly at night - after dinner). Negative for shortness of breath and wheezing.   Cardiovascular:  Positive for palpitations. Negative for chest pain and leg swelling.  Gastrointestinal:  Negative for abdominal pain.       No gerd  Musculoskeletal:  Positive for arthralgias (knees) and joint swelling.  Neurological:  Negative for light-headedness and headaches.       Objective:   Vitals:   08/25/22 1121  BP: (!) 140/90  Pulse: 78  Temp: 98 F (36.7 C)  SpO2: 97%   BP Readings from Last 3 Encounters:  08/25/22 (!) 140/90  07/22/22 (!) 189/86  06/30/22 (!) 148/88   Wt Readings from Last 3 Encounters:  08/25/22 191 lb (86.6 kg)  07/22/22 197 lb (89.4 kg)  06/30/22 191 lb (86.6 kg)   Body mass index is 32.79 kg/m.    Physical Exam Constitutional:      General: She is not in acute distress.    Appearance: Normal appearance.  HENT:     Head:  Normocephalic and atraumatic.  Eyes:     Conjunctiva/sclera: Conjunctivae normal.  Cardiovascular:     Rate and Rhythm: Normal rate and regular rhythm.     Heart sounds: Normal heart sounds.  Pulmonary:     Effort: Pulmonary effort is normal. No respiratory distress.     Breath sounds: Normal breath sounds. No wheezing.  Musculoskeletal:     Cervical back: Neck supple.     Right lower leg: No edema.     Left lower leg: No edema.  Lymphadenopathy:     Cervical: No cervical adenopathy.  Skin:    General: Skin is warm and dry.     Findings: No rash.  Neurological:     Mental Status: She is alert. Mental status is at baseline.  Psychiatric:        Mood and Affect: Mood normal.        Behavior: Behavior normal.        Lab Results  Component Value Date   WBC 7.8 11/18/2021   HGB 15.1 (H) 11/18/2021   HCT 45.6 11/18/2021   PLT 257.0 11/18/2021   GLUCOSE 106 (H) 06/30/2022   CHOL 296 (H) 06/30/2022   TRIG 190.0 (H) 06/30/2022   HDL 51.10 06/30/2022   LDLDIRECT 157.6 11/16/2012   LDLCALC  207 (H) 06/30/2022   ALT 13 06/30/2022   AST 17 06/30/2022   NA 141 06/30/2022   K 4.1 06/30/2022   CL 102 06/30/2022   CREATININE 0.82 06/30/2022   BUN 15 06/30/2022   CO2 30 06/30/2022   TSH 1.85 12/06/2020   INR 1.03 07/08/2018   HGBA1C 6.5 06/30/2022   MICROALBUR 15.9 (H) 11/18/2021     Assessment & Plan:    See Problem List for Assessment and Plan of chronic medical problems.

## 2022-08-24 NOTE — Patient Instructions (Addendum)
      Blood work was ordered.   The lab is on the first floor.    Medications changes include :   change metformin to once a day with breakfast.  Stop atorvastatin.  Start crestor.   Start taking all your medications with breakfast      Return in about 6 months (around 02/25/2023) for Physical Exam.

## 2022-08-25 ENCOUNTER — Encounter: Payer: Self-pay | Admitting: Internal Medicine

## 2022-08-25 ENCOUNTER — Ambulatory Visit (INDEPENDENT_AMBULATORY_CARE_PROVIDER_SITE_OTHER): Payer: BC Managed Care – PPO | Admitting: Internal Medicine

## 2022-08-25 VITALS — BP 140/90 | HR 78 | Temp 98.0°F | Ht 64.0 in | Wt 191.0 lb

## 2022-08-25 DIAGNOSIS — G309 Alzheimer's disease, unspecified: Secondary | ICD-10-CM

## 2022-08-25 DIAGNOSIS — E119 Type 2 diabetes mellitus without complications: Secondary | ICD-10-CM | POA: Diagnosis not present

## 2022-08-25 DIAGNOSIS — I10 Essential (primary) hypertension: Secondary | ICD-10-CM | POA: Diagnosis not present

## 2022-08-25 DIAGNOSIS — F0670 Mild neurocognitive disorder due to known physiological condition without behavioral disturbance: Secondary | ICD-10-CM

## 2022-08-25 DIAGNOSIS — E7800 Pure hypercholesterolemia, unspecified: Secondary | ICD-10-CM

## 2022-08-25 MED ORDER — METFORMIN HCL ER 750 MG PO TB24: 750.0000 mg | ORAL_TABLET | Freq: Every day | ORAL | 1 refills | Status: DC

## 2022-08-25 MED ORDER — ROSUVASTATIN CALCIUM 40 MG PO TABS: 40.0000 mg | ORAL_TABLET | Freq: Every day | ORAL | 3 refills | Status: DC

## 2022-08-25 NOTE — Assessment & Plan Note (Signed)
Chronic   Lab Results  Component Value Date   HGBA1C 6.5 06/30/2022   Sugars controlled She is not monitoring her sugars at home Check A1c Continue metformin-we will change to XR 750 mg daily instead of the twice daily dosing Stressed regular exercise, diabetic diet

## 2022-08-25 NOTE — Assessment & Plan Note (Signed)
Chronic Following with neurology Continue Aricept 10 mg daily 

## 2022-08-25 NOTE — Assessment & Plan Note (Addendum)
Chronic BP not well controlled here today initially-will recheck She does not monitor BP at home ? Taking medications daily-she does have dementia and they wonder if she is taking her medication on a daily basis.  Unfortunately has never come with her husband who still works.  Encouraged her to use a pillbox so she can double check herself if she is taking her medication or not Continue amlodipine 5 mg daily, Diovan 320 mg daily-she will check when she gets home to make sure she is taking both of these cmp

## 2022-08-25 NOTE — Assessment & Plan Note (Signed)
Chronic Regular exercise and healthy diet encouraged Check lipid panel  Will discontinue atorvastatin and start Crestor 40 mg daily-hopefully this will get her LDL slightly lower-advised okay to take with other medications

## 2022-09-17 DIAGNOSIS — H02882 Meibomian gland dysfunction right lower eyelid: Secondary | ICD-10-CM | POA: Diagnosis not present

## 2022-09-17 DIAGNOSIS — H00022 Hordeolum internum right lower eyelid: Secondary | ICD-10-CM | POA: Diagnosis not present

## 2022-09-18 ENCOUNTER — Encounter: Payer: BC Managed Care – PPO | Admitting: Psychology

## 2022-09-29 ENCOUNTER — Encounter: Payer: BC Managed Care – PPO | Admitting: Psychology

## 2022-10-09 DIAGNOSIS — H00022 Hordeolum internum right lower eyelid: Secondary | ICD-10-CM | POA: Diagnosis not present

## 2022-11-03 ENCOUNTER — Telehealth: Payer: Self-pay | Admitting: Physician Assistant

## 2022-11-03 ENCOUNTER — Other Ambulatory Visit: Payer: Self-pay | Admitting: Physician Assistant

## 2022-11-03 MED ORDER — DONEPEZIL HCL 10 MG PO TABS: ORAL_TABLET | ORAL | 11 refills | Status: DC

## 2022-11-03 NOTE — Telephone Encounter (Signed)
1. Which medications need refilled? (List name and dosage, if known) donepezil - she has hidden the medication from her husband and doesn't remember where it is. [facility] won't refill it until 11/10/22. He would like to see if he could get a 6 day prescription?  2. Which pharmacy/location is medication to be sent to? (include street and city if local pharmacy) [facility] [address]

## 2022-11-11 ENCOUNTER — Encounter: Payer: Self-pay | Admitting: Psychology

## 2022-11-12 ENCOUNTER — Encounter: Payer: Self-pay | Admitting: Psychology

## 2022-11-12 ENCOUNTER — Ambulatory Visit: Payer: BC Managed Care – PPO | Admitting: Psychology

## 2022-11-12 ENCOUNTER — Ambulatory Visit (INDEPENDENT_AMBULATORY_CARE_PROVIDER_SITE_OTHER): Payer: BC Managed Care – PPO | Admitting: Psychology

## 2022-11-12 DIAGNOSIS — G300 Alzheimer's disease with early onset: Secondary | ICD-10-CM | POA: Diagnosis not present

## 2022-11-12 DIAGNOSIS — F0390 Unspecified dementia without behavioral disturbance: Secondary | ICD-10-CM

## 2022-11-12 DIAGNOSIS — F02B4 Dementia in other diseases classified elsewhere, moderate, with anxiety: Secondary | ICD-10-CM

## 2022-11-12 DIAGNOSIS — R4189 Other symptoms and signs involving cognitive functions and awareness: Secondary | ICD-10-CM

## 2022-11-12 HISTORY — DX: Unspecified dementia, unspecified severity, without behavioral disturbance, psychotic disturbance, mood disturbance, and anxiety: F03.90

## 2022-11-12 NOTE — Progress Notes (Unsigned)
NEUROPSYCHOLOGICAL EVALUATION [facility] [place] Department of Neurology  Date of Evaluation: Nov 12, 2022  Reason for Referral:   Katherine Lawrence is a 67 y.o. right-handed Caucasian female referred by Marlowe Kays, PA-C, to characterize her current cognitive functioning and assist with diagnostic clarity and treatment planning in the context of prior concerns for an early onset neurodegenerative illness and ongoing progressive cognitive decline.   Assessment and Plan:   Clinical Impression(s): Katherine Lawrence pattern of performance is suggestive of severe impairment surrounding all aspects of learning and memory. Additional severe impairments were exhibited across executive functioning, safety/judgment, receptive language, verbal fluency, and confrontation naming. Processing speed represented a relative weakness, while performance variability was exhibited across visuospatial abilities. Performances were appropriate relative to age-matched peers across attention/concentration. Katherine Lawrence denied all cognitive and functional concerns during interview. However, I do not believe that she has appropriate insight into the extent of ongoing impairment. Her husband has essentially taken over medication management, financial management, and bill paying responsibilities. Given evidence for cognitive and functional impairment, Katherine Lawrence best meets diagnostic criteria for a Major Neurocognitive Disorder ("dementia") at the present time.  Relative to her previous evaluation in September 2022, notable decline was exhibited across a majority of assessed cognitive domains. Decline was more modest across visuospatial abilities and general stability was exhibited across attention/concentration. However, progressive decline was pronounced across processing speed, executive functioning, receptive language, expressive language, and all aspects of learning and memory.   Regarding etiology, current  testing with evidence for negative progression over time strengthens previous concerns surrounding the presence of a neurodegenerative illness. 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 Katherine Lawrence did not benefit from repeated exposure to information, was fully amnestic (i.e., 0% retention) after brief delays across all memory tasks, and performed very poorly across yes/no recognition trials. Taken together, this suggests rapid forgetting and a severe storage impairment, both of which represent hallmark characteristics of this illness. Further impairment surrounding semantic fluency, confrontation naming, and executive functioning follows typical disease progression.   Prior neuroimaging has revealed somewhat greater frontal lobe atrophy over the years. This finding is often associated with a frontotemporal dementia presentation. However, Katherine Lawrence and her husband have never described prominent personality changes which are associated with this illness. Frontal atrophy could raise concerns for the frontal variant of Alzheimer's disease, which remains a possibility and would seem more likely based on severe and worsening memory impairment. A primary progressive aphasia (PPA) presentation also seems less likely in that language abilities have not represented the primary or initial area of dysfunction. The timeline of dysfunction and decline continues to favor Alzheimer's disease. However, the logopenic PPA subtype does share pathology with Alzheimer's disease, causing fairly significant symptom overlap. While this remains plausible, I continue to feel that Alzheimer's disease with perhaps an atypical imaging profile best represents ongoing decline. Continued medical monitoring will be important moving forward.   Recommendations: Ms. Ekman has already been prescribed a medication aimed to address memory loss and concerns surrounding Alzheimer's disease (i.e.,  donepezil/Aricept). She is encouraged to continue taking this medication as prescribed. It is important to highlight that this medication has been shown to slow functional decline in some individuals. There is no current treatment which can stop or reverse cognitive decline when caused by a neurodegenerative illness.   Performance across neurocognitive testing is not a strong predictor of an individual's safety operating a motor vehicle. However, given the severity of ongoing  impairment, I do have concerns surrounding her ability to safely operate a motor vehicle. I would strongly encourage her to complete a formal driving evaluation to assess her safety behind the wheel. Should her family wish to pursue a formalized driving evaluation, they could reach out to the following agencies: [employer] in [place]: [phone] [facility]: [phone] [facility]: [phone] [facility]: [phone] or [phone]  It will be important for Katherine Lawrence to have another person with her when in situations where she may need to process information, weigh the pros and cons of different options, and make decisions, in order to ensure that she fully understands and recalls all information to be considered.  If not already done, Katherine Lawrence and her family may want to discuss her wishes regarding durable power of attorney and medical decision making, so that she can have input into these choices. If they require legal assistance with this, long-term care resource access, or other aspects of estate planning, they could reach out to [facility] at [phone] for a free consultation. Additionally, they may wish to discuss future plans for caretaking and seek out community options for in home/residential care should they become necessary.  Katherine Lawrence is encouraged to attend to lifestyle factors for brain health (e.g., regular physical exercise, good nutrition habits and  consideration of the MIND-DASH diet, regular participation in cognitively-stimulating activities, and general stress management techniques), which are likely to have benefits for both emotional adjustment and cognition. In fact, in addition to promoting good general health, regular exercise incorporating aerobic activities (e.g., brisk walking, jogging, cycling, etc.) has been demonstrated to be a very effective treatment for depression and stress, with similar efficacy rates to both antidepressant medication and psychotherapy. Optimal control of vascular risk factors (including safe cardiovascular exercise and adherence to dietary recommendations) is encouraged. Continued participation in activities which provide mental stimulation and social interaction is also recommended.   Important information should be provided to Ms. Pasko in written format in all instances. This information should be placed in a highly frequented and easily visible location within her home to promote recall. External strategies such as written notes in a consistently used memory journal, visual and nonverbal auditory cues such as a calendar on the refrigerator or appointments with alarm, such as on a cell phone, can also help maximize recall.  To address problems with fluctuating attention and/or executive dysfunction, she may wish to consider:   -Avoiding external distractions when needing to concentrate   -Limiting exposure to fast paced environments with multiple sensory demands   -Writing down complicated information and using checklists   -Attempting and completing one task at a time (i.e., no multi-tasking)   -Verbalizing aloud each step of a task to maintain focus   -Taking frequent breaks during the completion of steps/tasks to avoid fatigue   -Reducing the amount of information considered at one time   -Scheduling more difficult activities for a time of day where she is usually most alert  Review of Records:   Ms.  Lawrence was seen by [facility] Marland KitchenPatrcia Dolly, M.D.) on 04/13/2015 for an evaluation of memory loss. At that time, she reported short-term memory concerns, noting that she had been forgetting details of recent conversations and being more repetitive in conversation. This was said to be noticed by colleagues whom she worked with. She described an instance above five years prior where she got lost while driving in a familiar location. Her husband has also noticed  memory decline, particularly surrounding [name] being repetitive in conversation. She reportedly had ADHD testing in April 2016 which suggested attentional concerns, "although a more significant neurological difficulty may be present." Formal neuropsychological testing was recommended.   Katherine Lawrence was lost to follow-up and did not meet with Dr. Karel Jarvis again until 06/21/2018. However, she presented for this appointment due to a constellation of muscle pain, neck pain, and poor balance. Memory issues were said to have persisted, including her being repetitive in conversation. She noted working and having experiences where "all of a sudden [she] could not remember what to do." She also described occasional instances where she would get lost driving, as well as forgetting a bill payment about two months prior. Performance on a brief cognitive screening instrument (MOCA) was 23/30.   Katherine Lawrence was seen by [facility] Cheryll Cockayne, M.D.) on 12/06/2020 for regular follow-up. During that appointment, Dr. Lawerance Bach reportedly brought up prior concerns surrounding memory and other cognitive abilities. Katherine Lawrence acknowledged chronic memory concerns but no additional details were provided. Ultimately, Katherine Lawrence was referred for a comprehensive neuropsychological evaluation to characterize her cognitive abilities and to assist with diagnostic clarity and treatment planning.   She completed a comprehensive neuropsychological evaluation with myself on 03/12/2021.  Results suggested severe impairment surrounding all aspects of verbal learning and memory. Additional impairments were exhibited across confrontation naming, verbal fluency (semantic worse than phonemic), and executive functioning (including a task assessing safety and judgment). Performances were largely appropriate across processing speed, attention/concentration, receptive language, other aspects of expressive language not mentioned above, visuospatial abilities, and visual memory. Katherine Lawrence largely denied difficulties completing instrumental activities of daily living (ADLs) independently. Her husband who was present did not contradict this report. She was ultimately diagnosed with a mild neurocognitive disorder. Given severe memory impairment and other patterns of dysfunction, some concerns were expressed surrounding an early onset neurodegenerative illness. She was referred back to neurology to re-establish care. Repeat testing in 18-24 months was recommended.   She was most recently seen by [facility] Marlowe Kays, PA-C) on 07/22/2022 for follow-up. She had been started on donepezil in the interim and was tolerating this medication well. Memory loss was described as stable. Performance on a brief cognitive screening instrument (MMSE) was 26/30 in January 2023. Ultimately, Katherine Lawrence was referred for a repeat neuropsychological evaluation to characterize her cognitive abilities and to assist with diagnostic clarity and treatment planning.    Brain MRI on 04/23/2015 revealed age-advanced cerebral atrophy, most notably in the frontal regions. Brain MRI on 03/23/2019 revealed mild frontal atrophy bilaterally, as well as small deep white matter infarcts in the left corona radiata/lentiform nucleus. Brain MRI on 12/03/2021 was stable relative to previous findings.  Past Medical History:  Diagnosis Date   Allergy    CIN I (cervical intraepithelial neoplasia I) 1996   cryo...  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/14/2014   Snoring 12/07/2019   Thumb pain 06/01/2019   Type 2 diabetes mellitus without complication, without long-term current use of insulin 04/13/2015   Dr Dione Booze   Urinary frequency 11/18/2021   Visual disturbance 11/18/2021    Past Surgical History:  Procedure Laterality Date   ABDOMINAL HYSTERECTOMY  08/22/2011   CERVICAL CONE BIOPSY  1996   CESAREAN SECTION     x1   COLONOSCOPY W/ POLYPECTOMY  12/2008   x3   DILATION AND CURETTAGE OF UTERUS     KNEE ARTHROTOMY Left 08/15/2019   Procedure: Left knee arthrotomy; scar excision;  Surgeon: Ollen Gross, MD;  Location: [facility];  Service: Orthopedics;  Laterality: Left;    TONSILLECTOMY     TOTAL HIP ARTHROPLASTY Right 2008   TOTAL HIP ARTHROPLASTY Left 05/20/2017   Procedure: LEFT TOTAL HIP ARTHROPLASTY ANTERIOR APPROACH;  Surgeon: Ollen Gross, MD;  Location: [facility];  Service:  Orthopedics;  Laterality: Left;   TOTAL KNEE ARTHROPLASTY Left 02/14/2014   DR Cleophas Dunker   TOTAL KNEE ARTHROPLASTY Left 02/14/2014   Procedure: TOTAL KNEE ARTHROPLASTY;  Surgeon: Valeria Batman, MD;  Location: [facility] OR;  Service: Orthopedics;  Laterality: Left;   TOTAL KNEE REVISION Left 07/14/2018   Procedure: TOTAL KNEE REVISION;  Surgeon: Ollen Gross, MD;  Location: [facility];  Service: Orthopedics;  Laterality: Left;  Adductor Block    Current Outpatient Medications:    amLODipine (NORVASC) 5 MG tablet, TAKE 1 TABLET(5 MG) BY MOUTH DAILY FOR BLOOD PRESSURE, Disp: 90 tablet, Rfl: 2   donepezil (ARICEPT) 10 MG tablet, TAKE  1 TABLET AT 10 MG DAILY. For Memory, Disp: 30 tablet, Rfl: 11   metFORMIN (GLUCOPHAGE-XR) 750 MG 24 hr tablet, Take 1 tablet (750 mg total) by mouth daily with breakfast., Disp: 90 tablet, Rfl: 1   rosuvastatin (CRESTOR) 40 MG tablet, Take 1 tablet (40 mg total) by mouth daily. For cholesterol, Disp: 90 tablet, Rfl: 3   valsartan (DIOVAN) 320 MG tablet, Take 1 tablet (320 mg total) by mouth daily., Disp: 90 tablet, Rfl: 3  Clinical Interview:   The following information was obtained during a clinical interview with Katherine Lawrence and her husband prior to cognitive testing.  Cognitive Symptoms: Decreased short-term memory: Denied. Ms. Helmkamp previously reported that she generally does not notice significant memory lapses or have pronounced concerns. However, she did state that individuals will often come to her saying that she has been repeating herself or that they already had conversations that Ms. Parker does not recall having. During the current interview, she reported her perception of complete stability and did not describe prominent memory concerns. Her husband previously noted prominent memory dysfunction, especially Ms. Langhorne quickly forgetting new information and being very repetitive in conversation. Difficulties were said to be present for the past several years. He did state  his belief that memory has continued to decline since her previous evaluation in September 2022. Decreased long-term memory: Denied. Decreased attention/concentration: Denied. She previously  reported longstanding difficulties with sustained attention, concentration, and ease of distractibility, dating all the way back to childhood and adolescence. She did not report ever being formally tested for ADHD to her knowledge at that time.  Reduced processing speed: Denied.  Difficulties with executive functions: Denied. She previously reported some trouble with indecision. While some of this was said to be longstanding in nature, she did report that it had worsened over time at the time of her previous evaluation. She denied trouble with organization or impulsivity. No overt personality changes were reported.  Difficulties with emotion regulation: Denied. Difficulties with receptive language: Denied. Difficulties with word finding: Endorsed. She reported commonly experiencing the tip-of-the-tongue phenomenon, particularly with names of familiar individuals.  Decreased visuoperceptual ability: Denied.   Difficulties completing ADLs: Ms. Crombie denied all functional concerns. Her husband noted that he has become more involved with medication management and that there have been prior instances of medications being lost/misplaced or possibly hidden. He assists with Ms. Buttry in managing her pillbox. Her husband has fully taken over financial management and bill paying. Ms. Goucher continues to drive to very local, familiar settings. Past medical records do describe several instances where she has gotten lost while driving.   Additional Medical History: History of traumatic brain injury/concussion: Denied. History of stroke: A prior brain MRI in 2020 revealed very small deep white matter infarcts in the left hemisphere. These were said to occur sometime between 2016 and 2020 and were largely asymptomatic per Ms. Alvester Morin.   History of seizure activity: Denied. History of known exposure to toxins: Denied. Symptoms of chronic pain: Denied. She previously reported pronounced pain and discomfort radiating down the left side of her body, particularly her hip and knee. The cause for left-sided pain was unknown. Experience of frequent headaches/migraines: Denied. Frequent instances of dizziness/vertigo: Denied.    Sensory changes: She wears glasses and denied any current visual acuity concerns. She previously had described significant blurred vision due to unknown causes. Medical records do mention a history of glaucoma. Other sensory changes/difficulties (e.g., hearing, taste, or smell) were denied.  Balance/coordination difficulties: Endorsed. Her and her husband reported longstanding and unchanged generalized clumsiness with a long history of falls. This was said to be unchanged currently with one side of the body not exhibiting worse stability relative to the other.  Other motor difficulties: Denied.  Sleep History: Estimated hours obtained each night: 8 hours.  Difficulties falling asleep: Denied. Difficulties staying asleep: Denied. Feels rested and refreshed upon awakening: Endorsed.   History of snoring: Endorsed. History of waking up gasping for air: Denied. Witnessed breath cessation while asleep: Endorsed. Her husband previously described somewhat infrequent instances where he has observed Ms. Ciliberto briefly stop breathing while asleep. Neither of them reported any prior concerns surrounding the presence of sleep apnea. Currently, he noted that these experiences seem to have improved lately. They stated that Ms. Eisenhuth never had a formal sleep study conducted.   History of vivid dreaming: Denied. Excessive movement while asleep: Denied. Instances of acting out her dreams: Denied.  Psychiatric/Behavioral Health History: Depression: Ms. Kirchner described her current mood as "good." She acknowledged a more remote  history of mild depressive symptoms, but stated that these symptoms are in full remission and that she no longer takes anti-depressant medication. Current or remote suicidal ideation, intent, or plan was denied.  Anxiety: Denied.  Mania: Denied. Trauma History: Denied. Visual/auditory hallucinations: Denied. Delusional thoughts: Denied.   Tobacco: Denied. Alcohol: She denied current alcohol consumption as well  as a history of problematic alcohol abuse or dependence.  Recreational drugs: Denied.  Family History: Problem Relation Age of Onset   Pancreatic cancer Mother 80   Hypertension Father    Heart disease Father    Diabetes Son    Colon cancer Neg Hx    Esophageal cancer Neg Hx    Stomach cancer Neg Hx    Rectal cancer Neg Hx    This information was confirmed by Ms. Moshier.  Academic/Vocational History: Highest level of educational attainment: 14 years. She graduated from high school and completed an additional two years of college. She described herself as a good (A/B) student in academic settings. Math was noted as a relative weakness, along with science-based courses to a lesser extent.  History of developmental delay: Denied. History of grade repetition: Denied. Enrollment in special education courses: Denied. History of LD/ADHD: Denied.   Employment: Retired. 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   Evaluation Results:   Behavioral Observations: Ms. Masoner was accompanied by her husband, arrived to her appointment on time, and was appropriately dressed and groomed. She appeared alert. Observed gait and station were within normal limits. Gross motor functioning appeared intact upon informal observation and no abnormal movements (e.g., tremors) were noted. Her affect was generally relaxed and positive. Spontaneous speech was fluent. Thought processes appeared confused at times. There were instances where  she abruptly lost her train of thought and needed the question repeated. There were other times where the answer she produced was quite tangential or not directly related to the initial question asked. Minimal word finding difficulties were observed. Insight into her cognitive difficulties appeared very poor in that she largely denied all cognitive concerns despite current and previous testing revealing quite severe impairments in numerous cognitive domains.   During testing, severe confusion was noted. Ms. Mcbrayer quickly forgot task instructions and required very frequent reminders. While the intention was a 100% replication of her previous evaluation, the degree of confusion necessitated changes to the testing battery (i.e., RBANS in place of the NAB Memory Module). Sustained attention was adequate. Task engagement was adequate and she persisted when challenged. Overall, Ms. Kindley was cooperative with the clinical interview and subsequent testing procedures.   Adequacy of Effort: The validity of neuropsychological testing is limited by the extent to which the individual being tested may be assumed to have exerted adequate effort during testing. Ms. Schmitz expressed her intention to perform to the best of her abilities and exhibited adequate task engagement and persistence. Scores across stand-alone and embedded performance validity measures were variable but largely within expectation. Her sole below expectation performance is believed to be due to true, quite severe cognitive impairment rather than poor engagement or attempts to perform poorly. As such, the results of the current evaluation are believed to be a valid representation of Ms. Proano's current cognitive functioning.  Test Results: Ms. Bob was poorly oriented at the time of the current evaluation. She was unable to state the current date, day of the week, time, or name of the current clinic. When asked why she was here, she responded "Because of my  husband, he thinks I am babbling."   Intellectual abilities based upon educational and vocational attainment were estimated to be in the average range. Premorbid abilities were estimated to be within the below average range based upon a single-word reading test.   Processing speed was exceptionally low to below average.  Basic attention was average. More complex attention (e.g., working memory) was also average. Executive functioning was exceptionally low. She also performed in the exceptionally low range across a task assessing safety and judgment. Points were primarily lost due to severe confusion and the inability to provide appropriate responses to basic situations. For example, when asked why foods are marked with an expiration date, she responded "Because if you know somebody from [place], what they do is put on the stamps. They need to go up there and look at it." She was also stated "I have no idea" when asked why batteries should be replaced in a smoke detector or "I don't know what I could do" when asked why one should not leave a young child alone at home.   Assessed receptive language abilities were exceptionally low. Ms. Houdeshell exhibited difficulties across all aspects of this task, including sequencing commands, understanding more complex sentence structure, and following multi-step commands. Likewise, during interview, Ms. Balcazar was tangential, gave unrelated responses to asked questions several times, and frequently lost her train of thought. Assessed expressive language (e.g., verbal fluency and confrontation naming) was exceptionally low. Adequate performances were noted across a very basic reading comprehension task, as well as writing samples.      Assessed visuospatial/visuoconstructional abilities were variable, ranging from the well below average to above average normative ranges. Points were lost on her drawing of a clock due to mild spacing abnormalities with numerical placement,  as well as incorrect hand placement.    Learning (i.e., encoding) of novel verbal information was exceptionally low. Spontaneous delayed recall (i.e., retrieval) of previously learned information was also exceptionally low. Retention rates were 0% across a story learning task, 0% across a list learning task, and 0% across a figure drawing task. Performance across recognition tasks was exceptionally low, suggesting negligible evidence for information consolidation.   Results of emotional screening instruments suggested that recent symptoms of generalized anxiety were in the moderate range, while symptoms of depression were within normal limits. A screening instrument assessing recent sleep quality suggested the presence of minimal sleep dysfunction.  Tables of Scores:   Note: This summary of test scores accompanies the interpretive report and should not be considered in isolation without reference to the appropriate sections in the text. Descriptors are based on appropriate normative data and may be adjusted based on clinical judgment. Terms such as "Within Normal Limits" and "Outside Normal Limits" are used when a more specific description of the test score cannot be determined. Descriptors refer to the current evaluation only.         Percentile - Normative Descriptor > 98 - Exceptionally High 91-97 - Well Above Average 75-90 - Above Average 25-74 - Average 9-24 - Below Average 2-8 - Well Below Average < 2 - Exceptionally Low        Validity: September 2022 Current  DESCRIPTOR        ACS Word Choice: --- --- --- Outside Normal Limits  Dot Counting Test: --- --- --- Within Normal Limits  RBANS Effort Index: --- --- --- Within Normal Limits        Orientation:       Raw Score Raw Score Percentile   NAB Orientation, Form 1 28/29 21/29 --- ---        Cognitive Screening:       Raw Score Raw Score Percentile   SLUMS: 15/30 8/30 --- ---        RBANS, Form A: Standard Score/ Scaled Score  Standard Score/ Scaled Score Percentile   Total Score --- 49 <1 Exceptionally Low  Immediate Memory --- 40 <1 Exceptionally Low    List Learning --- 1 <1 Exceptionally Low    Story Memory --- 1 <1 Exceptionally Low  Visuospatial/Constructional --- 69 2 Well Below Average    Figure Copy --- 5 5 Well Below Average    Line Orientation --- 10/20 3-9 Well Below Average  Language --- 47 <1 Exceptionally Low    Picture Naming --- 6/10 <2 Exceptionally Low    Semantic Fluency --- 1 <1 Exceptionally Low  Attention --- 68 2 Well Below Average    Digit Span --- 8 25 Average    Coding --- 2 <1 Exceptionally Low  Delayed Memory --- 48 <1 Exceptionally Low    List Recall --- 0/10 <2 Exceptionally Low    List Recognition --- 14/20 <2 Exceptionally Low    Story Recall --- 1 <1 Exceptionally Low    Story Recognition --- 0/12 <1 Exceptionally Low    Figure Recall --- 1 <1 Exceptionally Low    Figure Recognition --- 0/8 1 Exceptionally Low         Intellectual Functioning:       Standard Score Standard Score Percentile   Test of Premorbid Functioning: 108 89 23 Below Average        Attention/Executive Function:      Trail Making Test (TMT): Raw Score (T Score) Raw Score (T Score) Percentile     Part A 39 secs.,  1 error (40) 44 secs.,  0 errors (40) 16 Below Average    Part B 181 secs.,  1 error (29) Discontinued --- Impaired         NAB Attention Module, Form 1: T Score T Score Percentile     Digits Forward 60 52 58 Average    Digits Backwards 43 43 25 Average         Scaled Score Scaled Score Percentile   WAIS-IV Similarities: 4 1 <1 Exceptionally Low        D-KEFS Color-Word Interference Test: Raw Score (Scaled Score) Raw Score (Scaled Score) Percentile     Color Naming 40 secs. (7) 75 secs. (1) <1 Exceptionally Low    Word Reading 24 secs. (10) 30 secs. (7) 16 Below Average    Inhibition 102 secs. (4) Discontinued --- Impaired      Total Errors 4 errors (8) --- --- ---     Inhibition/Switching 180 secs. (1) Discontinued --- Impaired      Total Errors 17 errors (1) --- --- ---        NAB Executive Functions Module, Form 1: T Score T Score Percentile     Judgment 25 19 <1 Exceptionally Low        Language:      Verbal Fluency Test: Raw Score (T Score) Raw Score (T Score) Percentile     Phonemic Fluency (FAS) 22 (33) 11 (19) <1 Exceptionally Low    Animal Fluency 5 (<19) 2 (<19) <1 Exceptionally Low         NAB Language Module, Form 1: T Score T Score Percentile     Oral Production 51 33 5 Well Below Average    Auditory Comprehension 57 19 <1 Exceptionally Low    Reading Comprehension --- 13/13 --- Within Normal Limits    Naming 27/31 (32) 18/31 (19) <1 Exceptionally Low    Writing 54 53 62 Average        Visuospatial/Visuoconstruction:  Raw Score Raw Score Percentile   Clock Drawing: 10/10 7/10 --- Within Normal Limits        NAB Spatial Module, Form 1: T Score T Score Percentile     Figure Drawing Copy 61 58 79 Above Average         Scaled Score Scaled Score Percentile   WAIS-IV Block Design: 7 5 5  Well Below Average        Mood and Personality:       Raw Score Raw Score Percentile   Geriatric Depression Scale: --- 7 --- Within Normal Limits  Geriatric Anxiety Scale: --- 22 --- Moderate    Somatic --- 5 --- Minimal    Cognitive --- 8 --- Moderate    Affective --- 9 --- Severe        Additional Questionnaires:       Raw Score Raw Score Percentile   PROMIS Sleep Disturbance Questionnaire: 11 17 --- None to Slight   Informed Consent and Coding/Compliance:   The current evaluation represents a clinical evaluation for the purposes previously outlined by the referral source and is in no way reflective of a forensic evaluation.   Ms. Siedlecki was provided with a verbal description of the nature and purpose of the present neuropsychological evaluation. Also reviewed were the foreseeable risks and/or discomforts and benefits of the procedure,  limits of confidentiality, and mandatory reporting requirements of this provider. The patient was given the opportunity to ask questions and receive answers about the evaluation. Oral consent to participate was provided by the patient.   This evaluation was conducted by Newman Nickels, Ph.D., ABPP-CN, board certified clinical neuropsychologist. Ms. Zborowski completed a clinical interview with Dr. Milbert Coulter, billed as one unit [phone], and 145 minutes of cognitive testing and scoring, billed as one unit [phone] and four additional units [id number]. Psychometrist Wallace Keller, B.S., assisted Dr. Milbert Coulter with test administration and scoring procedures. As a separate and discrete service, one unit [id number] and two units [phone] were billed for Dr. Tammy Sours time spent in interpretation and report writing.

## 2022-11-12 NOTE — Progress Notes (Signed)
   Psychometrician Note   Cognitive testing was administered to Casey Burkitt by Wallace Keller, B.S. (psychometrist) under the supervision of Dr. Newman Nickels, Ph.D., licensed psychologist on 11/12/2022. Ms. Laurel did not appear overtly distressed by the testing session per behavioral observation or responses across self-report questionnaires. Rest breaks were offered.    The battery of tests administered was selected by Dr. Newman Nickels, Ph.D. with consideration to Ms. Tahir's current level of functioning, the nature of her symptoms, emotional and behavioral responses during interview, level of literacy, observed level of motivation/effort, and the nature of the referral question. This battery was communicated to the psychometrist. Communication between Dr. Newman Nickels, Ph.D. and the psychometrist was ongoing throughout the evaluation and Dr. Newman Nickels, Ph.D. was immediately accessible at all times. Dr. Newman Nickels, Ph.D. provided supervision to the psychometrist on the date of this service to the extent necessary to assure the quality of all services provided.    Casey Burkitt will return within approximately 1-2 weeks for an interactive feedback session with Dr. Milbert Coulter at which time her test performances, clinical impressions, and treatment recommendations will be reviewed in detail. Ms. Mckinzie understands she can contact our office should she require our assistance before this time.  A total of 145 minutes of billable time were spent face-to-face with Ms. Srivastava by the psychometrist. This includes both test administration and scoring time. Billing for these services is reflected in the clinical report generated by Dr. Newman Nickels, Ph.D.  This note reflects time spent with the psychometrician and does not include test scores or any clinical interpretations made by Dr. Milbert Coulter. The full report will follow in a separate note.

## 2022-11-13 ENCOUNTER — Encounter: Payer: Self-pay | Admitting: Psychology

## 2022-11-20 ENCOUNTER — Ambulatory Visit (INDEPENDENT_AMBULATORY_CARE_PROVIDER_SITE_OTHER): Payer: BC Managed Care – PPO | Admitting: Psychology

## 2022-11-20 DIAGNOSIS — F02B4 Dementia in other diseases classified elsewhere, moderate, with anxiety: Secondary | ICD-10-CM

## 2022-11-20 DIAGNOSIS — G300 Alzheimer's disease with early onset: Secondary | ICD-10-CM

## 2022-11-20 NOTE — Progress Notes (Signed)
   Neuropsychology Feedback Session [name]. [facility] [place] Department of Neurology  Reason for Referral:   [name] is a 67 y.o. right-handed Caucasian female referred by Marlowe Kays, PA-C, to characterize her current cognitive functioning and assist with diagnostic clarity and treatment planning in the context of prior concerns for an early onset neurodegenerative illness and ongoing progressive cognitive decline.   Feedback:   [name] completed a comprehensive neuropsychological evaluation on 11/12/2022. Please refer to that encounter for the full report and recommendations. Briefly, results suggested severe impairment surrounding all aspects of learning and memory. Additional severe impairments were exhibited across executive functioning, safety/judgment, receptive language, verbal fluency, and confrontation naming. Processing speed represented a relative weakness, while performance variability was exhibited across visuospatial abilities. Relative to her previous evaluation in September 2022, notable decline was exhibited across a majority of assessed cognitive domains. Decline was more modest across visuospatial abilities and general stability was exhibited across attention/concentration. However, progressive decline was pronounced across processing speed, executive functioning, receptive language, expressive language, and all aspects of learning and memory. Regarding etiology, current testing with evidence for negative progression over time strengthens previous concerns surrounding the presence of a neurodegenerative illness. 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 [name] did not benefit from repeated exposure to information, was fully amnestic (i.e., 0% retention) after brief delays across all memory tasks, and performed very poorly across yes/no recognition trials. Taken together, this suggests rapid forgetting and a  severe storage impairment, both of which represent hallmark characteristics of this illness. Further impairment surrounding semantic fluency, confrontation naming, and executive functioning follows typical disease progression.   [name] was accompanied by her husband during the current feedback session. Content of the current session focused on the results of her neuropsychological evaluation. [name] was given the opportunity to ask questions and her questions were answered. She was encouraged to reach out should additional questions arise. A copy of her report was provided at the conclusion of the visit.      One unit [phone] was billed for Dr. Tammy Sours time spent preparing for, conducting, and documenting the current feedback session with Ms. Schilt.

## 2023-01-20 ENCOUNTER — Encounter: Payer: Self-pay | Admitting: Physician Assistant

## 2023-01-20 ENCOUNTER — Ambulatory Visit (INDEPENDENT_AMBULATORY_CARE_PROVIDER_SITE_OTHER): Payer: BC Managed Care – PPO | Admitting: Physician Assistant

## 2023-01-20 VITALS — BP 185/75 | HR 86 | Resp 20 | Ht 64.0 in | Wt 208.0 lb

## 2023-01-20 DIAGNOSIS — F02B4 Dementia in other diseases classified elsewhere, moderate, with anxiety: Secondary | ICD-10-CM | POA: Diagnosis not present

## 2023-01-20 DIAGNOSIS — G300 Alzheimer's disease with early onset: Secondary | ICD-10-CM

## 2023-01-20 MED ORDER — MEMANTINE HCL 5 MG PO TABS: ORAL_TABLET | ORAL | 11 refills | Status: DC

## 2023-01-20 NOTE — Patient Instructions (Signed)
It was a pleasure to see you today at our office.   Recommendations:  Meds: Follow up in 6 months Continue Donepezil 10 mg daily.  Side effects discussed   Start memantine 5 mg, take one tab at night for 2 weeks, then increase to 1 tab twice a day .   RECOMMENDATIONS FOR ALL PATIENTS WITH MEMORY PROBLEMS: 1. Continue to exercise (Recommend 30 minutes of walking everyday, or 3 hours every week) 2. Increase social interactions - continue going to [place] and enjoy social gatherings with friends and family 3. Eat healthy, avoid fried foods and eat more fruits and vegetables 4. Maintain adequate blood pressure, blood sugar, and blood cholesterol level. Reducing the risk of stroke and cardiovascular disease also helps promoting better memory. 5. Avoid stressful situations. Live a simple life and avoid aggravations. Organize your time and prepare for the next day in anticipation. 6. Sleep well, avoid any interruptions of sleep and avoid any distractions in the bedroom that may interfere with adequate sleep quality 7. Avoid sugar, avoid sweets as there is a strong link between excessive sugar intake, diabetes, and cognitive impairment We discussed the Mediterranean diet, which has been shown to help patients reduce the risk of progressive memory disorders and reduces cardiovascular risk. This includes eating fish, eat fruits and green leafy vegetables, nuts like almonds and hazelnuts, walnuts, and also use olive oil. Avoid fast foods and fried foods as much as possible. Avoid sweets and sugar as sugar use has been linked to worsening of memory function.  There is always a concern of gradual progression of memory problems. If this is the case, then we may need to adjust level of care according to patient needs. Support, both to the patient and caregiver, should then be put into place.    The Alzheimer's Association is here all day, every day for people facing Alzheimer's disease through our free 24/7  Helpline: [phone]. The Helpline provides reliable information and support to all those who need assistance, such as individuals living with memory loss, Alzheimer's or other dementia, caregivers, health care professionals and the public.  Our highly trained and knowledgeable staff can help you with: Understanding memory loss, dementia and Alzheimer's  Medications and other treatment options  General information about aging and brain health  Skills to provide quality care and to find the best care from professionals  Legal, financial and living-arrangement decisions Our Helpline also features: Confidential care consultation provided by master's level clinicians who can help with decision-making support, crisis assistance and education on issues families face every day  Help in a caller's preferred language using our translation service that features more than 200 languages and dialects  Referrals to local community programs, services and ongoing support     FALL PRECAUTIONS: Be cautious when walking. Scan the area for obstacles that may increase the risk of trips and falls. When getting up in the mornings, sit up at the edge of the bed for a few minutes before getting out of bed. Consider elevating the bed at the head end to avoid drop of blood pressure when getting up. Walk always in a well-lit room (use night lights in the walls). Avoid area rugs or power cords from appliances in the middle of the walkways. Use a walker or a cane if necessary and consider physical therapy for balance exercise. Get your eyesight checked regularly.  FINANCIAL OVERSIGHT: Supervision, especially oversight when making financial decisions or transactions is also recommended.  HOME SAFETY: Consider the safety of  the kitchen when operating appliances like stoves, microwave oven, and blender. Consider having supervision and share cooking responsibilities until no longer able to participate in those. Accidents with  firearms and other hazards in the house should be identified and addressed as well.   ABILITY TO BE LEFT ALONE: If patient is unable to contact 911 operator, consider using LifeLine, or when the need is there, arrange for someone to stay with patients. Smoking is a fire hazard, consider supervision or cessation. Risk of wandering should be assessed by caregiver and if detected at any point, supervision and safe proof recommendations should be instituted.  MEDICATION SUPERVISION: Inability to self-administer medication needs to be constantly addressed. Implement a mechanism to ensure safe administration of the medications.   DRIVING: Regarding driving, in patients with progressive memory problems, driving will be impaired. We advise to have someone else do the driving if trouble finding directions or if minor accidents are reported. Independent driving assessment is available to determine safety of driving.   If you are interested in the driving assessment, you can contact the following:  [employer] in [place] [phone]  [facility] [phone]  [facility] [phone] or [phone]      Mediterranean Diet A Mediterranean diet refers to food and lifestyle choices that are based on the traditions of countries located on the [employer]. This way of eating has been shown to help prevent certain conditions and improve outcomes for people who have chronic diseases, like kidney disease and heart disease. What are tips for following this plan? Lifestyle  Cook and eat meals together with your family, when possible. Drink enough fluid to keep your urine clear or pale yellow. Be physically active every day. This includes: Aerobic exercise like running or swimming. Leisure activities like gardening, walking, or housework. Get 7-8 hours of sleep each night. If recommended by your health care provider,  drink red wine in moderation. This means 1 glass a day for nonpregnant women and 2 glasses a day for men. A glass of wine equals 5 oz (150 mL). Reading food labels  Check the serving size of packaged foods. For foods such as rice and pasta, the serving size refers to the amount of cooked product, not dry. Check the total fat in packaged foods. Avoid foods that have saturated fat or trans fats. Check the ingredients list for added sugars, such as corn syrup. Shopping  At the grocery store, buy most of your food from the areas near the walls of the store. This includes: Fresh fruits and vegetables (produce). Grains, beans, nuts, and seeds. Some of these may be available in unpackaged forms or large amounts (in bulk). Fresh seafood. Poultry and eggs. Low-fat dairy products. Buy whole ingredients instead of prepackaged foods. Buy fresh fruits and vegetables in-season from local farmers markets. Buy frozen fruits and vegetables in resealable bags. If you do not have access to quality fresh seafood, buy precooked frozen shrimp or canned fish, such as tuna, salmon, or sardines. Buy small amounts of raw or cooked vegetables, salads, or olives from the deli or salad bar at your store. Stock your pantry so you always have certain foods on hand, such as olive oil, canned tuna, canned tomatoes, rice, pasta, and beans. Cooking  Cook foods with extra-virgin olive oil instead of using butter or other vegetable oils. Have meat as a side dish, and have vegetables or grains as your main dish. This means having meat in small  portions or adding small amounts of meat to foods like pasta or stew. Use beans or vegetables instead of meat in common dishes like chili or lasagna. Experiment with different cooking methods. Try roasting or broiling vegetables instead of steaming or sauteing them. Add frozen vegetables to soups, stews, pasta, or rice. Add nuts or seeds for added healthy fat at each meal. You can add  these to yogurt, salads, or vegetable dishes. Marinate fish or vegetables using olive oil, lemon juice, garlic, and fresh herbs. Meal planning  Plan to eat 1 vegetarian meal one day each week. Try to work up to 2 vegetarian meals, if possible. Eat seafood 2 or more times a week. Have healthy snacks readily available, such as: Vegetable sticks with hummus. Greek yogurt. Fruit and nut trail mix. Eat balanced meals throughout the week. This includes: Fruit: 2-3 servings a day Vegetables: 4-5 servings a day Low-fat dairy: 2 servings a day Fish, poultry, or lean meat: 1 serving a day Beans and legumes: 2 or more servings a week Nuts and seeds: 1-2 servings a day Whole grains: 6-8 servings a day Extra-virgin olive oil: 3-4 servings a day Limit red meat and sweets to only a few servings a month What are my food choices? Mediterranean diet Recommended Grains: Whole-grain pasta. Brown rice. Bulgar wheat. Polenta. Couscous. Whole-wheat bread. Orpah Cobb. Vegetables: Artichokes. Beets. Broccoli. Cabbage. Carrots. Eggplant. Green beans. Chard. Kale. Spinach. Onions. Leeks. Peas. Squash. Tomatoes. Peppers. Radishes. Fruits: Apples. Apricots. Avocado. Berries. Bananas. Cherries. Dates. Figs. Grapes. Lemons. Melon. Oranges. Peaches. Plums. Pomegranate. Meats and other protein foods: Beans. Almonds. Sunflower seeds. Pine nuts. Peanuts. Cod. Salmon. Scallops. Shrimp. Tuna. Tilapia. Clams. Oysters. Eggs. Dairy: Low-fat milk. Cheese. Greek yogurt. Beverages: Water. Red wine. Herbal tea. Fats and oils: Extra virgin olive oil. Avocado oil. Grape seed oil. Sweets and desserts: [place] yogurt with honey. Baked apples. Poached pears. Trail mix. Seasoning and other foods: Basil. Cilantro. Coriander. Cumin. Mint. Parsley. Sage. Rosemary. Tarragon. Garlic. Oregano. Thyme. Pepper. Balsalmic vinegar. Tahini. Hummus. Tomato sauce. Olives. Mushrooms. Limit these Grains: Prepackaged pasta or rice dishes.  Prepackaged cereal with added sugar. Vegetables: Deep fried potatoes (french fries). Fruits: Fruit canned in syrup. Meats and other protein foods: Beef. Pork. Lamb. Poultry with skin. Hot dogs. Tomasa Blase. Dairy: Ice cream. Sour cream. Whole milk. Beverages: Juice. Sugar-sweetened soft drinks. Beer. Liquor and spirits. Fats and oils: Butter. Canola oil. Vegetable oil. Beef fat (tallow). Lard. Sweets and desserts: Cookies. Cakes. Pies. Candy. Seasoning and other foods: Mayonnaise. Premade sauces and marinades. The items listed may not be a complete list. Talk with your dietitian about what dietary choices are right for you. Summary The Mediterranean diet includes both food and lifestyle choices. Eat a variety of fresh fruits and vegetables, beans, nuts, seeds, and whole grains. Limit the amount of red meat and sweets that you eat. Talk with your health care provider about whether it is safe for you to drink red wine in moderation. This means 1 glass a day for nonpregnant women and 2 glasses a day for men. A glass of wine equals 5 oz (150 mL). This information is not intended to replace advice given to you by your health care provider. Make sure you discuss any questions you have with your health care provider. Document Released: 01/24/2016 Document Revised: 02/26/2016 Document Reviewed: 01/24/2016 Elsevier Interactive Patient Education  2017 [employer].

## 2023-01-20 NOTE — Progress Notes (Signed)
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 RH female hypertension, arthritis with failed total rib placement, anxiety, depression, hyperlipidemia, DM2, history of CVA 2021 and a diagnosis of moderate early onset dementia due to Alzheimer's disease with behavioral disturbance seen today in follow up for memory loss. Patient is currently on donepezil 10 mg daily tolerating well.  Unfortunately, her last testing demonstrated progression of the disease, for which she we discussed adding memantine as a second agent to help slow down the process.  She agrees to do so. She needs more assistance with her ADLs than prior.  Mood is controlled as per PCP.    Follow up in 6 months. Continue donepezil 10 mg daily Start memantine  5 mg twice daily as directed, side effects discussed, the goal is to increase it to 10 mg twice daily if tolerated. Recommend good control of her cardiovascular risk factors Continue to control mood as per PCP    Subjective:    This patient is accompanied in the office by her husband who supplements the history.  Previous records as well as any outside records available were reviewed prior to todays visit. Patient was last seen on 07/22/2022.    Any changes in memory since last visit? " A little bit worse, I can't think of anything anymore".  She has more difficulty remembering recent conversations and sometimes "she cannot find the right word ", which provoke some level of frustration. Repeats oneself?  Endorsed Disoriented when walking into a room?  Patient denies    Leaving objects in unusual places?  May misplace things. 3 months ago "she could not find her wallet and it was on a drawer where the Sharpies are"-husband says.   Wandering behavior?  denies   Any personality changes since last visit?  As before, she has moments of irritability. Any worsening  depression?:  Denies.   Hallucinations or paranoia?  Denies.   Seizures? denies    Any sleep changes? Sleeps well,  she does have  vivid dreams,  no REM behavior or sleepwalking   Sleep apnea?   Denies.   Any hygiene concerns? Denies.  Independent of bathing and dressing?  Endorsed  Does the patient needs help with medications?  Patient is in charge   Who is in charge of the finances?  Husband is in charge     Any changes in appetite?  denies     Patient have trouble swallowing? Denies.   Does the patient cook? No Any headaches?   denies   Chronic back pain  denies   Ambulates with difficulty?  She has L knee arthritis and has seen her orthopedic physician, according to her cannot do anything else about it. Uses a cane "when I have to"  Recent falls or head injuries? denies     Unilateral weakness, numbness or tingling? denies   Any tremors?  Denies   Any anosmia?  Denies   Any incontinence of urine?  Endorsed, wears diapers Any bowel dysfunction?   Denies       Patient lives with her husband Does the patient drive?  Short distances to known places   Personally reviewed of the brain 12/03/2021 remarkable for re demonstrated chronic lacunar infarct within the left corona radiata / basal ganglia, stable mild cerebral white matter chronic small vessel ischemic disease, frontal lobe predominant cerebral atrophy.    Neuropsychological evaluation 03/12/21 :  results suggested severe impairment surrounding all aspects of verbal learning and memory. Additional impairments were exhibited across confrontation naming, verbal fluency (semantic worse than phonemic), and executive functioning (including a task assessing safety and judgment). Regarding etiology, despite Katherine Lawrence's young age, I unfortunately do have concerns surrounding Alzheimer's disease. She demonstrated prominent verbal memory dysfunction with evidence to suggest a deficit with information storage. She also demonstrated impairments in  confrontation naming, semantic fluency, and executive functioning. Overall, this does represent a fairly classic pattern of dysfunction in earlier stages of this disease process. 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 However, upon review of these images, possible areas represented do not involve the hippocampus or other primary memory centers of the brain. As such, while there may be a mild vascular contribution to her current presentation, a vascular etiology is unlikely to fully account for memory impairment or other areas of dysfunction.  Neuropsychological evaluation 11/20/2022 briefly, results suggested severe impairment surrounding all aspects of learning and memory. Additional severe impairments were exhibited across executive functioning, safety/judgment, receptive language, verbal fluency, and confrontation naming. Processing speed represented a relative weakness, while performance variability was exhibited across visuospatial abilities. Relative to her previous evaluation in September 2022, notable decline was exhibited across a majority of assessed cognitive domains. Decline was more modest across visuospatial abilities and general stability was exhibited across attention/concentration. However, progressive decline was pronounced across processing speed, executive functioning, receptive language, expressive language, and all aspects of learning and memory. Regarding etiology, current testing with evidence for negative progression over time strengthens previous concerns surrounding the presence of a neurodegenerative illness. 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 Katherine Lawrence did not benefit from repeated exposure to information, was fully amnestic (i.e., 0% retention) after brief delays across all memory tasks, and performed very poorly across yes/no recognition trials. Taken together, this suggests rapid forgetting  and a severe storage impairment, both of which represent hallmark characteristics of this illness. Further impairment surrounding semantic fluency, confrontation naming, and executive functioning follows typical disease progression.     [facility] MRI on 04/23/2015 revealed age-advanced cerebral atrophy, most notable in the frontal regions. Brain MRI on 03/23/2019 revealed mild frontal atrophy bilaterally, as well as small deep white matter infarcts in the left hemisphere.  Negative for acute infarct. Mild chronic ischemia left cerebral white matter has developed since 2016. Mild frontal atrophy.     History of Present Illness 04/21/2019:  The patient had a telephone visit on 04/21/2019. She was last seen in the neurology clinic 10 months ago for myalgias. EMG/NCV did not show any evidence of myopathy, blood work no evidence of inflammation. She was seen in 2016 for memory changes. MRI brain in 04/2015 unremarkable. She reports that since her last visit, she is noticing more cognitive changes. She has noticed more hesitation during conversations, she would have to stop and think of what she was going to say. She was laid off in October and offered early retirement, she noticed toward the end that she had to write down everything she was going to say if she had to speak on conference calls. She would go blank when called on to discuss something. She has gotten lost driving, unable to recall how to get to places she has been to for years. She has left the stove on. She infrequently forgets medications. She denies missing bill payments. Sleep and mood are good. She had a knee replacement with continued pain. She denies  any headaches, dizziness, vision changes, focal numbness/tingling/weakness. She is not having as much myalgias as before. She had a repeat MRI brain without contrast done 03/2019 did not show any acute changes. 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 There was mild  frontal atrophy bilaterally.        PREVIOUS MEDICATIONS:   CURRENT MEDICATIONS:  Outpatient Encounter Medications as of 01/20/2023  Medication Sig   amLODipine (NORVASC) 5 MG tablet TAKE 1 TABLET(5 MG) BY MOUTH DAILY FOR BLOOD PRESSURE   donepezil (ARICEPT) 10 MG tablet TAKE  1 TABLET AT 10 MG DAILY. For Memory   memantine (NAMENDA) 5 MG tablet Take 1 tablet (5 mg at night) for 2 weeks, then increase to 1 tablet (5 mg) twice a day   metFORMIN (GLUCOPHAGE-XR) 750 MG 24 hr tablet Take 1 tablet (750 mg total) by mouth daily with breakfast.   rosuvastatin (CRESTOR) 40 MG tablet Take 1 tablet (40 mg total) by mouth daily. For cholesterol   valsartan (DIOVAN) 320 MG tablet Take 1 tablet (320 mg total) by mouth daily.   No facility-administered encounter medications on file as of 01/20/2023.       01/20/2023    5:00 PM 07/11/2021    3:00 PM  MMSE - Mini Mental State Exam  Not completed: --   Orientation to time  4  Orientation to Place  5  Registration  3  Attention/ Calculation  5  Recall  1  Language- name 2 objects  2  Language- repeat  1  Language- follow 3 step command  3  Language- read & follow direction  1  Write a sentence  1  Copy design  0  Total score  26      06/21/2018    9:00 AM 04/13/2015    1:20 PM  Montreal Cognitive Assessment   Visuospatial/ Executive (0/5) 4 3  Naming (0/3) 3 3  Attention: Read list of digits (0/2) 2 2  Attention: Read list of letters (0/1) 1 1  Attention: Serial 7 subtraction starting at 100 (0/3) 3 3  Language: Repeat phrase (0/2) 2 2  Language : Fluency (0/1) 0 0  Abstraction (0/2) 2 2  Delayed Recall (0/5) 0 2  Orientation (0/6) 6 6  Total 23 24  Adjusted Score (based on education)  24    Objective:     PHYSICAL EXAMINATION:    VITALS:   Vitals:   01/20/23 1536  BP: (!) 185/75  Pulse: 86  Resp: 20  SpO2: 99%  Weight: 208 lb (94.3 kg)  Height: 5\' 4"  (1.626 m)    GEN:  The patient appears stated age and is in  NAD. HEENT:  Normocephalic, atraumatic.   Neurological examination:  General: NAD, well-groomed, appears stated age. Orientation: The patient is alert. Oriented to person, place not to date Cranial nerves: There is good facial symmetry.The speech is fluent and clear. No aphasia or dysarthria. Fund of knowledge is appropriate. Recent and remote memory are impaired. Attention and concentration are reduced.  Able to name objects and repeat phrases.  Hearing is intact to conversational tone.  Sensation: Sensation is intact to light touch throughout Motor: Strength is at least antigravity x4. DTR's 2/4 in UE/LE     Movement examination: Tone: There is normal tone in the UE/LE Abnormal movements:  no tremor.  No myoclonus.  No asterixis.   Coordination:  There is no decremation with RAM's. Normal finger to nose  Gait and Station: The patient has some difficulty arising out of a deep-seated chair without the use of the hands. The patient's stride length is good.  Gait is cautious and narrow, uncomfortable, leans to the right because of painful left knee.    Thank you for allowing [place] the opportunity to participate in the care of this nice patient. Please do not hesitate to contact us for any questions or concerns.   Total time spent on today's visit was 31 minutes dedicated to this patient today, preparing to see patient, examining the patient, ordering tests and/or medications and counseling the patient, documenting clinical information in the EHR or other health record, independently interpreting results and communicating results to the patient/family, discussing treatment and goals, answering patient's questions and coordinating care.  Cc:  Pincus Sanes, MD  Marlowe Kays 01/20/2023 5:59 PM

## 2023-01-29 ENCOUNTER — Encounter (INDEPENDENT_AMBULATORY_CARE_PROVIDER_SITE_OTHER): Payer: Self-pay

## 2023-02-25 ENCOUNTER — Encounter: Payer: Self-pay | Admitting: Internal Medicine

## 2023-02-25 DIAGNOSIS — E538 Deficiency of other specified B group vitamins: Secondary | ICD-10-CM | POA: Insufficient documentation

## 2023-02-25 NOTE — Patient Instructions (Addendum)
Consider getting the COVID-vaccine and shingles vaccine at your pharmacy.   Blood work was ordered.   The lab is on the first floor.    Medications changes include :       A referral was ordered and someone will call you to schedule an appointment.     Return in about 6 months (around 08/26/2023) for follow up.   Health Maintenance, Female Adopting a healthy lifestyle and getting preventive care are important in promoting health and wellness. Ask your health care provider about: The right schedule for you to have regular tests and exams. Things you can do on your own to prevent diseases and keep yourself healthy. What should I know about diet, weight, and exercise? Eat a healthy diet  Eat a diet that includes plenty of vegetables, fruits, low-fat dairy products, and lean protein. Do not eat a lot of foods that are high in solid fats, added sugars, or sodium. Maintain a healthy weight Body mass index (BMI) is used to identify weight problems. It estimates body fat based on height and weight. Your health care provider can help determine your BMI and help you achieve or maintain a healthy weight. Get regular exercise Get regular exercise. This is one of the most important things you can do for your health. Most adults should: Exercise for at least 150 minutes each week. The exercise should increase your heart rate and make you sweat (moderate-intensity exercise). Do strengthening exercises at least twice a week. This is in addition to the moderate-intensity exercise. Spend less time sitting. Even light physical activity can be beneficial. Watch cholesterol and blood lipids Have your blood tested for lipids and cholesterol at 65 years of age, then have this test every 5 years. Have your cholesterol levels checked more often if: Your lipid or cholesterol levels are high. You are older than 65 years of age. You are at high risk for heart disease. What should I know about cancer  screening? Depending on your health history and family history, you may need to have cancer screening at various ages. This may include screening for: Breast cancer. Cervical cancer. Colorectal cancer. Skin cancer. Lung cancer. What should I know about heart disease, diabetes, and high blood pressure? Blood pressure and heart disease High blood pressure causes heart disease and increases the risk of stroke. This is more likely to develop in people who have high blood pressure readings or are overweight. Have your blood pressure checked: Every 3-5 years if you are 6-81 years of age. Every year if you are 35 years old or older. Diabetes Have regular diabetes screenings. This checks your fasting blood sugar level. Have the screening done: Once every three years after age 43 if you are at a normal weight and have a low risk for diabetes. More often and at a younger age if you are overweight or have a high risk for diabetes. What should I know about preventing infection? Hepatitis B If you have a higher risk for hepatitis B, you should be screened for this virus. Talk with your health care provider to find out if you are at risk for hepatitis B infection. Hepatitis C Testing is recommended for: Everyone born from [birth year] through 1963. Anyone with known risk factors for hepatitis C. Sexually transmitted infections (STIs) Get screened for STIs, including gonorrhea and chlamydia, if: You are sexually active and are younger than 65 years of age. You are older than 65 years of age and your health care provider  tells you that you are at risk for this type of infection. Your sexual activity has changed since you were last screened, and you are at increased risk for chlamydia or gonorrhea. Ask your health care provider if you are at risk. Ask your health care provider about whether you are at high risk for HIV. Your health care provider may recommend a prescription medicine to help prevent HIV  infection. If you choose to take medicine to prevent HIV, you should first get tested for HIV. You should then be tested every 3 months for as long as you are taking the medicine. Pregnancy If you are about to stop having your period (premenopausal) and you may become pregnant, seek counseling before you get pregnant. Take 400 to 800 micrograms (mcg) of folic acid every day if you become pregnant. Ask for birth control (contraception) if you want to prevent pregnancy. Osteoporosis and menopause Osteoporosis is a disease in which the bones lose minerals and strength with aging. This can result in bone fractures. If you are 46 years old or older, or if you are at risk for osteoporosis and fractures, ask your health care provider if you should: Be screened for bone loss. Take a calcium or vitamin D supplement to lower your risk of fractures. Be given hormone replacement therapy (HRT) to treat symptoms of menopause. Follow these instructions at home: Alcohol use Do not drink alcohol if: Your health care provider tells you not to drink. You are pregnant, may be pregnant, or are planning to become pregnant. If you drink alcohol: Limit how much you have to: 0-1 drink a day. Know how much alcohol is in your drink. In the [place] one drink equals one 12 oz bottle of beer (355 mL), one 5 oz glass of wine (148 mL), or one 1 oz glass of hard liquor (44 mL). Lifestyle Do not use any products that contain nicotine or tobacco. These products include cigarettes, chewing tobacco, and vaping devices, such as e-cigarettes. If you need help quitting, ask your health care provider. Do not use street drugs. Do not share needles. Ask your health care provider for help if you need support or information about quitting drugs. General instructions Schedule regular health, dental, and eye exams. Stay current with your vaccines. Tell your health care provider if: You often feel depressed. You have ever been abused  or do not feel safe at home. Summary Adopting a healthy lifestyle and getting preventive care are important in promoting health and wellness. Follow your health care provider's instructions about healthy diet, exercising, and getting tested or screened for diseases. Follow your health care provider's instructions on monitoring your cholesterol and blood pressure. This information is not intended to replace advice given to you by your health care provider. Make sure you discuss any questions you have with your health care provider. Document Revised: 10/22/2020 Document Reviewed: 10/22/2020 Elsevier Patient Education  2024 [employer].

## 2023-02-25 NOTE — Progress Notes (Signed)
Subjective:    Patient ID: Katherine Lawrence, female    DOB: 1956/05/03, 67 y.o.   MRN: [id number]      HPI Tulsi is here for a Physical exam and her chronic medical problems.      Medications and allergies reviewed with patient and updated if appropriate.  Current Outpatient Medications on File Prior to Visit  Medication Sig Dispense Refill  . amLODipine (NORVASC) 5 MG tablet TAKE 1 TABLET(5 MG) BY MOUTH DAILY FOR BLOOD PRESSURE 90 tablet 2  . donepezil (ARICEPT) 10 MG tablet TAKE  1 TABLET AT 10 MG DAILY. For Memory 30 tablet 11  . metFORMIN (GLUCOPHAGE-XR) 750 MG 24 hr tablet Take 1 tablet (750 mg total) by mouth daily with breakfast. 90 tablet 1  . rosuvastatin (CRESTOR) 40 MG tablet Take 1 tablet (40 mg total) by mouth daily. For cholesterol 90 tablet 3  . valsartan (DIOVAN) 320 MG tablet Take 1 tablet (320 mg total) by mouth daily. 90 tablet 3   No current facility-administered medications on file prior to visit.    Review of Systems     Objective:  There were no vitals filed for this visit. There were no vitals filed for this visit. There is no height or weight on file to calculate BMI.  BP Readings from Last 3 Encounters:  01/20/23 (!) 185/75  08/25/22 (!) 140/90  07/22/22 (!) 189/86    Wt Readings from Last 3 Encounters:  01/20/23 208 lb (94.3 kg)  08/25/22 191 lb (86.6 kg)  07/22/22 197 lb (89.4 kg)       Physical Exam Constitutional: She appears well-developed and well-nourished. No distress.  HENT:  Head: Normocephalic and atraumatic.  Right Ear: External ear normal. Normal ear canal and TM Left Ear: External ear normal.  Normal ear canal and TM Mouth/Throat: Oropharynx is clear and moist.  Eyes: Conjunctivae normal.  Neck: Neck supple. No tracheal deviation present. No thyromegaly present.  No carotid bruit  Cardiovascular: Normal rate, regular rhythm and normal heart sounds.   No murmur heard.  No edema. Pulmonary/Chest: Effort normal and  breath sounds normal. No respiratory distress. She has no wheezes. She has no rales.  Breast: deferred   Abdominal: Soft. She exhibits no distension. There is no tenderness.  Lymphadenopathy: She has no cervical adenopathy.  Skin: Skin is warm and dry. She is not diaphoretic.  Psychiatric: She has a normal mood and affect. Her behavior is normal.     Lab Results  Component Value Date   WBC 7.8 11/18/2021   HGB 15.1 (H) 11/18/2021   HCT 45.6 11/18/2021   PLT 257.0 11/18/2021   GLUCOSE 106 (H) 06/30/2022   CHOL 296 (H) 06/30/2022   TRIG 190.0 (H) 06/30/2022   HDL 51.10 06/30/2022   LDLDIRECT 157.6 11/16/2012   LDLCALC 207 (H) 06/30/2022   ALT 13 06/30/2022   AST 17 06/30/2022   NA 141 06/30/2022   K 4.1 06/30/2022   CL 102 06/30/2022   CREATININE 0.82 06/30/2022   BUN 15 06/30/2022   CO2 30 06/30/2022   TSH 1.85 12/06/2020   INR 1.03 07/08/2018   HGBA1C 6.5 06/30/2022   MICROALBUR 15.9 (H) 11/18/2021         Assessment & Plan:   Physical exam: Screening blood work  ordered Exercise   Weight   Substance abuse  none   Reviewed recommended immunizations.   Health Maintenance  Topic Date Due  . Zoster Vaccines- Shingrix (1 of 2) Never  done  . FOOT EXAM  05/25/2019  . DEXA SCAN  07/23/2022  . OPHTHALMOLOGY EXAM  11/15/2022  . DTaP/Tdap/Td (3 - Td or Tdap) 11/17/2022  . Diabetic kidney evaluation - Urine ACR  11/19/2022  . HEMOGLOBIN A1C  12/29/2022  . INFLUENZA VACCINE  01/15/2023  . Pneumonia Vaccine 30+ Years old (3 of 3 - PPSV23 or PCV20) 02/08/2023  . COVID-19 Vaccine (4 - 2023-24 season) 02/15/2023  . MAMMOGRAM  04/18/2023  . Diabetic kidney evaluation - eGFR measurement  07/01/2023  . Colonoscopy  03/20/2026  . Hepatitis C Screening  Completed  . HIV Screening  Completed  . HPV VACCINES  Aged Out          See Problem List for Assessment and Plan of chronic medical problems.     This encounter was created in error - please disregard.

## 2023-02-26 ENCOUNTER — Encounter: Payer: BC Managed Care – PPO | Admitting: Internal Medicine

## 2023-02-26 DIAGNOSIS — E538 Deficiency of other specified B group vitamins: Secondary | ICD-10-CM

## 2023-02-26 DIAGNOSIS — E7800 Pure hypercholesterolemia, unspecified: Secondary | ICD-10-CM

## 2023-02-26 DIAGNOSIS — E119 Type 2 diabetes mellitus without complications: Secondary | ICD-10-CM

## 2023-02-26 DIAGNOSIS — F02B4 Dementia in other diseases classified elsewhere, moderate, with anxiety: Secondary | ICD-10-CM

## 2023-02-26 DIAGNOSIS — Z0000 Encounter for general adult medical examination without abnormal findings: Secondary | ICD-10-CM

## 2023-02-26 DIAGNOSIS — I10 Essential (primary) hypertension: Secondary | ICD-10-CM

## 2023-02-26 DIAGNOSIS — Z8673 Personal history of transient ischemic attack (TIA), and cerebral infarction without residual deficits: Secondary | ICD-10-CM

## 2023-02-26 NOTE — Assessment & Plan Note (Signed)
Chronic Regular exercise and healthy diet encouraged Check lipid panel, CMP, TSH Continue Crestor 40 mg daily

## 2023-02-26 NOTE — Assessment & Plan Note (Addendum)
Chronic History of CVA-no residual-this was seen on MRI Check lipid panel, CMP Stressed good blood pressure and sugar control Continue Crestor 40 mg daily

## 2023-02-26 NOTE — Assessment & Plan Note (Signed)
Chronic Following with neurology Continue Aricept 10 mg daily, Namenda 5 mg twice daily

## 2023-02-26 NOTE — Assessment & Plan Note (Signed)
Chronic Blood pressure well controlled CMP, CBC Continue amlodipine 5 mg daily, valsartan 320 mg daily

## 2023-02-26 NOTE — Assessment & Plan Note (Signed)
Chronic Encouraged regular exercise, but she is limited by arthritis and her dementia Encouraged healthy eating

## 2023-02-26 NOTE — Assessment & Plan Note (Signed)
Chronic   Lab Results  Component Value Date   HGBA1C 6.5 06/30/2022   Sugars controlled She is not monitoring her sugars at home Check A1c, urine microalbumin Continue metformin XR 750 mg daily  Stressed regular exercise, diabetic diet

## 2023-02-26 NOTE — Assessment & Plan Note (Signed)
Chronic ?Check B12 level ?

## 2023-03-31 ENCOUNTER — Other Ambulatory Visit: Payer: Self-pay | Admitting: Physician Assistant

## 2023-04-14 ENCOUNTER — Telehealth: Payer: Self-pay | Admitting: Physician Assistant

## 2023-04-14 ENCOUNTER — Other Ambulatory Visit: Payer: Self-pay

## 2023-04-14 MED ORDER — MEMANTINE HCL 5 MG PO TABS: ORAL_TABLET | ORAL | 1 refills | Status: DC

## 2023-04-14 NOTE — Telephone Encounter (Signed)
1. Which medications need to be refilled? (please list name of each medication and dose if known) memantine (NAMENDA) 5 MG tablet [[id number]]   2. Which pharmacy/location (including street and city if local pharmacy) is medication to be sent to? [id number] - [place], [place] - [address] AT [facility] [address]  3. Do they need a 30 day or 90 day supply?   Caller states wife needs a refill on memantine. Has enough until Wednesday. Medication is taken 2x a day. Pt would like a larger count to avoid refill every 15 days.

## 2023-04-14 NOTE — Telephone Encounter (Signed)
Sent refill request

## 2023-04-21 ENCOUNTER — Other Ambulatory Visit: Payer: Self-pay | Admitting: Internal Medicine

## 2023-05-05 ENCOUNTER — Ambulatory Visit (INDEPENDENT_AMBULATORY_CARE_PROVIDER_SITE_OTHER): Payer: BC Managed Care – PPO | Admitting: Emergency Medicine

## 2023-05-05 ENCOUNTER — Encounter: Payer: Self-pay | Admitting: Emergency Medicine

## 2023-05-05 VITALS — BP 142/92 | HR 71 | Temp 98.3°F | Ht 64.0 in | Wt 205.8 lb

## 2023-05-05 DIAGNOSIS — R42 Dizziness and giddiness: Secondary | ICD-10-CM | POA: Diagnosis not present

## 2023-05-05 DIAGNOSIS — Z23 Encounter for immunization: Secondary | ICD-10-CM

## 2023-05-05 LAB — CBC WITH DIFFERENTIAL/PLATELET
Basophils Absolute: 0 10*3/uL (ref 0.0–0.1)
Basophils Relative: 0.4 % (ref 0.0–3.0)
Eosinophils Absolute: 0.1 10*3/uL (ref 0.0–0.7)
Eosinophils Relative: 1.1 % (ref 0.0–5.0)
HCT: 45.8 % (ref 36.0–46.0)
Hemoglobin: 15 g/dL (ref 12.0–15.0)
Lymphocytes Relative: 22.2 % (ref 12.0–46.0)
Lymphs Abs: 2.1 10*3/uL (ref 0.7–4.0)
MCHC: 32.8 g/dL (ref 30.0–36.0)
MCV: 91.7 fL (ref 78.0–100.0)
Monocytes Absolute: 0.5 10*3/uL (ref 0.1–1.0)
Monocytes Relative: 5.5 % (ref 3.0–12.0)
Neutro Abs: 6.8 10*3/uL (ref 1.4–7.7)
Neutrophils Relative %: 70.8 % (ref 43.0–77.0)
Platelets: 274 10*3/uL (ref 150.0–400.0)
RBC: 4.99 Mil/uL (ref 3.87–5.11)
RDW: 14.5 % (ref 11.5–15.5)
WBC: 9.6 10*3/uL (ref 4.0–10.5)

## 2023-05-05 LAB — COMPREHENSIVE METABOLIC PANEL
ALT: 11 U/L (ref 0–35)
AST: 13 U/L (ref 0–37)
Albumin: 4.8 g/dL (ref 3.5–5.2)
Alkaline Phosphatase: 52 U/L (ref 39–117)
BUN: 12 mg/dL (ref 6–23)
CO2: 32 meq/L (ref 19–32)
Calcium: 10.1 mg/dL (ref 8.4–10.5)
Chloride: 103 meq/L (ref 96–112)
Creatinine, Ser: 0.6 mg/dL (ref 0.40–1.20)
GFR: 94.32 mL/min (ref >60.00)
Glucose, Bld: 104 mg/dL — ABNORMAL HIGH (ref 70–99)
Potassium: 3.9 meq/L (ref 3.5–5.1)
Sodium: 143 meq/L (ref 135–145)
Total Bilirubin: 0.6 mg/dL (ref 0.2–1.2)
Total Protein: 7.4 g/dL (ref 6.0–8.3)

## 2023-05-05 LAB — HEMOGLOBIN A1C: Hgb A1c MFr Bld: 6.3 % (ref 4.6–6.5)

## 2023-05-05 MED ORDER — MECLIZINE HCL 12.5 MG PO TABS
ORAL_TABLET | ORAL | 0 refills | Status: DC
Start: 2023-05-05 — End: 2023-07-01

## 2023-05-05 NOTE — Assessment & Plan Note (Signed)
Elevated blood pressure reading in the office but normal at home.  Advised to monitor blood pressure readings several times a day for the next couple of weeks and keep a log.  Advised to contact the office if numbers persistently abnormal.  Blood pressure medication may need to be adjusted. Patient clinically stable.  No red flag signs or symptoms. Differential diagnosis discussed.  No findings of stroke. Benign physical examination.  No neurological deficits. Normal gait.  Normal balance. Recommend blood work today. May use meclizine as needed for symptom management. ED precautions given.

## 2023-05-05 NOTE — Progress Notes (Signed)
Katherine Lawrence 67 y.o.   Chief Complaint  Patient presents with   Dizziness    Patient started this morning she states the room is spinning w/ blurred vision. When she sits she is really light headed and just as bad when she lays down.  Also c/o of a lot of burping here in the past 2 weeks. Does want flu shot     HISTORY OF PRESENT ILLNESS: Acute problem visit today.  Patient of Dr. Cheryll Cockayne. This is a 67 y.o. female complaining of sudden onset dizziness this morning.  Intermittent symptoms.  Better now. Room was spinning earlier today.  Positional changes.  Denies headache.  Denies fever, chills or flulike symptoms. No balance issues.  No other associated symptoms. No other complaints or medical concerns today. BP Readings from Last 3 Encounters:  01/20/23 (!) 185/75  08/25/22 (!) 140/90  07/22/22 (!) 189/86     Dizziness Pertinent negatives include no abdominal pain, chest pain, chills, congestion, coughing, fever, headaches, nausea, rash, sore throat or vomiting.     Prior to Admission medications   Medication Sig Start Date End Date Taking? Authorizing Provider  amLODipine (NORVASC) 5 MG tablet TAKE 1 TABLET(5 MG) BY MOUTH DAILY FOR BLOOD PRESSURE 04/21/23  Yes Burns, Bobette Mo, MD  donepezil (ARICEPT) 10 MG tablet TAKE  1 TABLET AT 10 MG DAILY. For Memory 11/03/22  Yes Gwynneth Munson, Sung Amabile, PA-C  memantine (NAMENDA) 5 MG tablet START MEMANTINE 5MG , TAKE 1 TABLET FOR 2 WEEKS, THEN INCREASE TO 1 TABLET TWICE A DAY 04/14/23  Yes Gwynneth Munson, Sung Amabile, PA-C  metFORMIN (GLUCOPHAGE-XR) 750 MG 24 hr tablet Take 1 tablet (750 mg total) by mouth daily with breakfast. 08/25/22  Yes Burns, Bobette Mo, MD  rosuvastatin (CRESTOR) 40 MG tablet Take 1 tablet (40 mg total) by mouth daily. For cholesterol 08/25/22  Yes Burns, Bobette Mo, MD  valsartan (DIOVAN) 320 MG tablet Take 1 tablet (320 mg total) by mouth daily. 06/30/22  Yes Pincus Sanes, MD    Allergies  Allergen Reactions   Pollen Extract     Other  reaction(s): Eye redness, Eye swelling    Patient Active Problem List   Diagnosis Date Noted   B12 deficiency 02/25/2023   Dementia, likely due to Alzheimer's disease 11/12/2022   Osteoarthritis of right knee 02/16/2022   Urinary frequency 11/18/2021   Dizziness 11/18/2021   Visual disturbance 11/18/2021   Dysrhythmia 03/12/2021   Constipation 02/14/2021   LLQ pain 12/06/2020   Neck pain 04/13/2020   Snoring 12/07/2019   History of CVA (cerebrovascular accident) 12/06/2019   Greater trochanteric pain syndrome 02/18/2018   Glaucoma 07/14/2017   Osteoarthritis of left hip 05/10/2017   Type 2 diabetes mellitus without complication, without long-term current use of insulin 04/13/2015   S/P total knee replacement using cement, left 02/14/2014   Osteoarthritis of left knee    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   Obesity 07/30/2011   Osteoarthritis of left hip 05/10/2017   Osteoarthritis of left knee    Pending L TKR fall 2015 - [facility]   Osteoarthritis of  right knee 02/16/2022   Palpitations 08/05/2019   Pneumonia 06/2003   Postsurgical menopause    Pure hypercholesterolemia 04/03/2008   S/P total knee replacement using cement 02/14/2014   Snoring 12/07/2019   Thumb pain 06/01/2019   Type 2 diabetes mellitus without complication, without long-term current use of insulin 04/13/2015   Dr Dione Booze   Urinary frequency 11/18/2021   Visual disturbance 11/18/2021    Past Surgical History:  Procedure Laterality Date   ABDOMINAL HYSTERECTOMY  08/22/2011   CERVICAL CONE BIOPSY  1996   CESAREAN SECTION     x1   COLONOSCOPY W/ POLYPECTOMY  12/2008   x3   DILATION AND CURETTAGE OF UTERUS     KNEE ARTHROTOMY Left 08/15/2019   Procedure: Left knee arthrotomy; scar excision;  Surgeon: Ollen Gross, MD;  Location: [facility];  Service: Orthopedics;  Laterality: Left;    TONSILLECTOMY     TOTAL HIP ARTHROPLASTY Right 2008   TOTAL HIP ARTHROPLASTY Left 05/20/2017   Procedure: LEFT TOTAL HIP ARTHROPLASTY ANTERIOR APPROACH;  Surgeon: Ollen Gross, MD;  Location: [facility];  Service: Orthopedics;  Laterality: Left;   TOTAL KNEE ARTHROPLASTY Left 02/14/2014   DR Cleophas Dunker   TOTAL KNEE ARTHROPLASTY Left 02/14/2014   Procedure: TOTAL KNEE ARTHROPLASTY;  Surgeon: Valeria Batman, MD;  [facility];  Service: Orthopedics;  Laterality: Left;   TOTAL KNEE REVISION Left 07/14/2018   Procedure: TOTAL KNEE REVISION;  Surgeon: Ollen Gross, MD;  Location: [facility];  Service: Orthopedics;  Laterality: Left;  Adductor Block    Social History   Socioeconomic History   Marital status: Married    Spouse name: Not on file   Number of children: 2   Years of education: 14   Highest education level: Some college, no degree  Occupational History   Occupation: Retired    Comment: Advertising copywriter - Warden/ranger  Tobacco Use   Smoking status: Former    Current packs/day: 0.00    Average packs/day: 0.3 packs/day for 1 year (0.3 ttl pk-yrs)    Types: Cigarettes     Start date: 07/08/2009    Quit date: 07/08/2010    Years since quitting: 12.8   Smokeless tobacco: Never  Vaping Use   Vaping status: Never Used  Substance and Sexual Activity   Alcohol use: Not Currently   Drug use: No   Sexual activity: Not Currently    Comment: 1st intercourse- 14, partners- 10, married- 35 yrs   Other Topics Concern   Not on file  Social History Narrative   Lives with spouse; grown kids   Pt is also a Consulting civil engineer.   Regular exercise-yes      Right Handed    Lives in a one story home    Social Determinants of Health   Financial Resource Strain: Not on file  Food Insecurity: No Food Insecurity (05/02/2022)   Hunger  Vital Sign    Worried About Programme researcher, broadcasting/film/video in the Last Year: Never true    Ran Out of Food in the Last Year: Never true  Transportation Needs: No Transportation Needs (05/02/2022)   PRAPARE - Administrator, Civil Service (Medical): No    Lack of Transportation (Non-Medical): No  Physical Activity: Not on file  Stress: Not on file  Social Connections: Unknown (11/10/2021)   Received from [facility], [facility]   Social Network    Social Network: Not on file  Intimate Partner Violence: Unknown (11/10/2021)   Received from [facility], [facility]   HITS    Physically Hurt: Not on file    Insult or Talk Down To: Not on file    Threaten Physical Harm: Not on file    Scream or Curse: Not on file    Family History  Problem Relation Age of Onset   Pancreatic cancer Mother 6   Hypertension Father    Heart disease Father    Diabetes Son    Colon cancer Neg Hx    Esophageal cancer Neg Hx    Stomach cancer Neg Hx    Rectal cancer Neg Hx      Review of Systems  Constitutional: Negative.  Negative for chills and fever.  HENT: Negative.  Negative for congestion and sore throat.   Respiratory: Negative.  Negative for cough and shortness of breath.   Cardiovascular: Negative.  Negative for chest pain and  palpitations.  Gastrointestinal:  Negative for abdominal pain, diarrhea, nausea and vomiting.  Genitourinary: Negative.  Negative for dysuria and hematuria.  Skin: Negative.  Negative for rash.  Neurological:  Positive for dizziness. Negative for sensory change, speech change, focal weakness and headaches.  All other systems reviewed and are negative.   Today's Vitals   05/05/23 1423  BP: (!) 142/92  Pulse: 71  Temp: 98.3 F (36.8 C)  TempSrc: Oral  SpO2: 96%  Weight: 205 lb 12.8 oz (93.4 kg)  Height: 5\' 4"  (1.626 m)   Body mass index is 35.33 kg/m.   Physical Exam Vitals reviewed.  Constitutional:      Appearance: Normal appearance.  HENT:     Head: Normocephalic.     Right Ear: Tympanic membrane, ear canal and external ear normal.     Left Ear: Tympanic membrane, ear canal and external ear normal.     Mouth/Throat:     Mouth: Mucous membranes are moist.     Pharynx: Oropharynx is clear.  Eyes:     Extraocular Movements: Extraocular movements intact.     Conjunctiva/sclera: Conjunctivae normal.     Pupils: Pupils are equal, round, and reactive to light.  Cardiovascular:     Rate and Rhythm: Normal rate and regular rhythm.     Pulses: Normal pulses.     Heart sounds: Normal heart sounds.  Pulmonary:     Effort: Pulmonary effort is normal.     Breath sounds: Normal breath sounds.  Musculoskeletal:     Cervical back: No tenderness.  Lymphadenopathy:     Cervical: No cervical adenopathy.  Skin:    General: Skin is warm and dry.  Neurological:     General: No focal deficit present.     Mental Status: She is alert and oriented to person, place, and time.     Cranial Nerves: No cranial nerve deficit.     Sensory: No sensory deficit.     Motor: No weakness.  Coordination: Coordination normal.     Gait: Gait normal.  Psychiatric:        Mood and Affect: Mood normal.        Behavior: Behavior normal.      ASSESSMENT & PLAN: A total of 42 minutes was spent  with the patient and counseling/coordination of care regarding preparing for this visit, review of most recent office visit notes, review of multiple chronic medical conditions under management, review of all medications, differential diagnosis of dizziness, prognosis, ED precautions, review of most recent blood work results, documentation and need for follow-up.  Problem List Items Addressed This Visit       Other   Dizziness - Primary    Elevated blood pressure reading in the office but normal at home.  Advised to monitor blood pressure readings several times a day for the next couple of weeks and keep a log.  Advised to contact the office if numbers persistently abnormal.  Blood pressure medication may need to be adjusted. Patient clinically stable.  No red flag signs or symptoms. Differential diagnosis discussed.  No findings of stroke. Benign physical examination.  No neurological deficits. Normal gait.  Normal balance. Recommend blood work today. May use meclizine as needed for symptom management. ED precautions given.      Relevant Medications   meclizine (ANTIVERT) 12.5 MG tablet   Other Relevant Orders   Comprehensive metabolic panel   CBC with Differential/Platelet   Hemoglobin A1c   Other Visit Diagnoses     Need for influenza vaccination       Relevant Orders   Flu Vaccine Trivalent High Dose (Fluad) (Completed)      Patient Instructions  Dizziness Dizziness is a common problem. It makes you feel unsteady or light-headed. You may feel like you are about to pass out (faint). Dizziness can lead to getting hurt if you stumble or fall. Dizziness can be caused by many things, including: Medicines. Not having enough water in your body (dehydration). Illness. Follow these instructions at home: Eating and drinking  Drink enough fluid to keep your pee (urine) pale yellow. This helps to keep you from getting dehydrated. Try to drink more clear fluids, such as water. Do not  drink alcohol. Limit how much caffeine you drink or eat, if your doctor tells you to do that. Limit how much salt (sodium) you drink or eat, if your doctor tells you to do that. Activity  Avoid making quick movements. Stand up slowly from sitting in a chair, and steady yourself until you feel okay. In the morning, first sit up on the side of the bed. When you feel okay, stand up slowly while you hold onto something. Do this until you know that your balance is okay. If you need to stand in one place for a long time, move your legs often. Tighten and relax the muscles in your legs while you are standing. Do not drive or use machinery if you feel dizzy. Avoid bending down if you feel dizzy. Place items in your home so you can reach them easily without leaning over. Lifestyle Do not smoke or use any products that contain nicotine or tobacco. If you need help quitting, ask your doctor. Try to lower your stress level. You can do this by using methods such as yoga or meditation. Talk with your doctor if you need help. General instructions Watch your dizziness for any changes. Take over-the-counter and prescription medicines only as told by your doctor. Talk with your doctor  if you think that you are dizzy because of a medicine that you are taking. Tell a friend or a family member that you are feeling dizzy. If he or she notices any changes in your behavior, have this person call your doctor. Keep all follow-up visits. Contact a doctor if: Your dizziness does not go away. Your dizziness or light-headedness gets worse. You feel like you may vomit (are nauseous). You have trouble hearing. You have new symptoms. You are unsteady on your feet. You feel like the room is spinning. You have neck pain or a stiff neck. You have a fever. Get help right away if: You vomit or have watery poop (diarrhea), and you cannot eat or drink anything. You have trouble: Talking. Walking. Swallowing. Using your  arms, hands, or legs. You feel generally weak. You are not thinking clearly, or you have trouble forming sentences. A friend or family member may notice this. You have: Chest pain. Pain in your belly (abdomen). Shortness of breath. Sweating. Your vision changes. You are bleeding. You have a very bad headache. These symptoms may be an emergency. Get help right away. Call your local emergency services (911 in the [place] Do not wait to see if the symptoms will go away. Do not drive yourself to the hospital. Summary Dizziness makes you feel unsteady or light-headed. You may feel like you are about to pass out (faint). Drink enough fluid to keep your pee (urine) pale yellow. Do not drink alcohol. Avoid making quick movements if you feel dizzy. Watch your dizziness for any changes. This information is not intended to replace advice given to you by your health care provider. Make sure you discuss any questions you have with your health care provider. Document Revised: 05/04/2020 Document Reviewed: 05/07/2020 Elsevier Patient Education  2024 Elsevier Inc.    Edwina Barth, MD [facility]

## 2023-05-05 NOTE — Patient Instructions (Signed)
Dizziness Dizziness is a common problem. It makes you feel unsteady or light-headed. You may feel like you are about to pass out (faint). Dizziness can lead to getting hurt if you stumble or fall. Dizziness can be caused by many things, including: Medicines. Not having enough water in your body (dehydration). Illness. Follow these instructions at home: Eating and drinking  Drink enough fluid to keep your pee (urine) pale yellow. This helps to keep you from getting dehydrated. Try to drink more clear fluids, such as water. Do not drink alcohol. Limit how much caffeine you drink or eat, if your doctor tells you to do that. Limit how much salt (sodium) you drink or eat, if your doctor tells you to do that. Activity  Avoid making quick movements. Stand up slowly from sitting in a chair, and steady yourself until you feel okay. In the morning, first sit up on the side of the bed. When you feel okay, stand up slowly while you hold onto something. Do this until you know that your balance is okay. If you need to stand in one place for a long time, move your legs often. Tighten and relax the muscles in your legs while you are standing. Do not drive or use machinery if you feel dizzy. Avoid bending down if you feel dizzy. Place items in your home so you can reach them easily without leaning over. Lifestyle Do not smoke or use any products that contain nicotine or tobacco. If you need help quitting, ask your doctor. Try to lower your stress level. You can do this by using methods such as yoga or meditation. Talk with your doctor if you need help. General instructions Watch your dizziness for any changes. Take over-the-counter and prescription medicines only as told by your doctor. Talk with your doctor if you think that you are dizzy because of a medicine that you are taking. Tell a friend or a family member that you are feeling dizzy. If he or she notices any changes in your behavior, have this  person call your doctor. Keep all follow-up visits. Contact a doctor if: Your dizziness does not go away. Your dizziness or light-headedness gets worse. You feel like you may vomit (are nauseous). You have trouble hearing. You have new symptoms. You are unsteady on your feet. You feel like the room is spinning. You have neck pain or a stiff neck. You have a fever. Get help right away if: You vomit or have watery poop (diarrhea), and you cannot eat or drink anything. You have trouble: Talking. Walking. Swallowing. Using your arms, hands, or legs. You feel generally weak. You are not thinking clearly, or you have trouble forming sentences. A friend or family member may notice this. You have: Chest pain. Pain in your belly (abdomen). Shortness of breath. Sweating. Your vision changes. You are bleeding. You have a very bad headache. These symptoms may be an emergency. Get help right away. Call your local emergency services (911 in the [place] Do not wait to see if the symptoms will go away. Do not drive yourself to the hospital. Summary Dizziness makes you feel unsteady or light-headed. You may feel like you are about to pass out (faint). Drink enough fluid to keep your pee (urine) pale yellow. Do not drink alcohol. Avoid making quick movements if you feel dizzy. Watch your dizziness for any changes. This information is not intended to replace advice given to you by your health care provider. Make sure you discuss any questions   you have with your health care provider. Document Revised: 05/04/2020 Document Reviewed: 05/07/2020 Elsevier Patient Education  2024 Elsevier Inc.  

## 2023-06-06 ENCOUNTER — Other Ambulatory Visit: Payer: Self-pay | Admitting: Physician Assistant

## 2023-06-09 ENCOUNTER — Other Ambulatory Visit: Payer: Self-pay | Admitting: Internal Medicine

## 2023-06-09 IMAGING — MR MR HEAD W/O CM
10 series · 48 of 48 positions shown · non-contrast
Comparison: Brain MRI 03/23/2019.

CLINICAL DATA: Provided history: Visual disturbance. Dizziness.
Transient ischemic attack (TIA); dizziness, nonspecific. Additional
history provided by scanning technologist: Patient reports
left-sided headaches for 6 years, symptoms worsening recently.

EXAM:
MRI HEAD WITHOUT CONTRAST
TECHNIQUE: Multiplanar, multiecho pulse sequences of the brain and surrounding
structures were obtained without intravenous contrast.

[Series 2: T1 · sagittal · 5.0mm · 0.45mm/px · 3 of 21 slices shown]
[im 1/21]
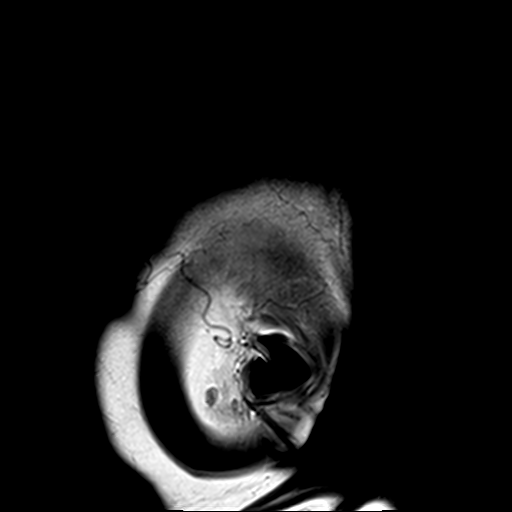
[im 11/21]
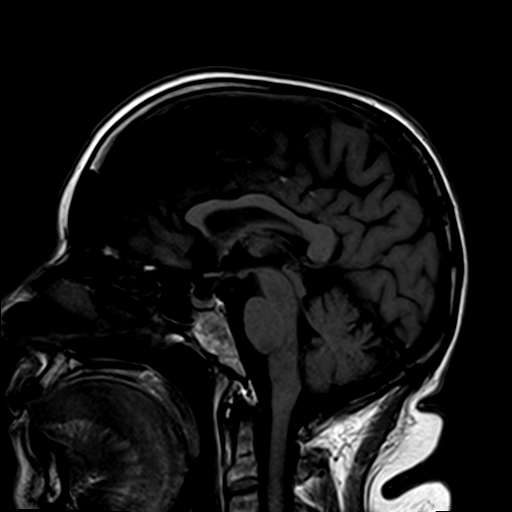
[im 21/21]
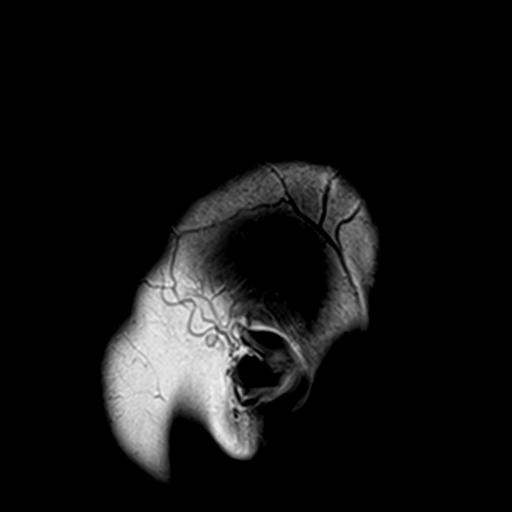

[Series 3: DWI · axial · 3.0mm · 1.80mm/px · z∈[-29,+115]mm · 9 of 100 slices shown (1 of 4)]
[im 1/100]
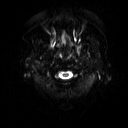
[im 13/100]
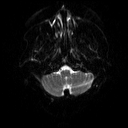
[im 25/100]
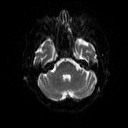
[im 38/100]
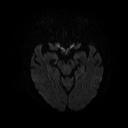
[im 50/100]
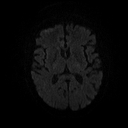
[im 62/100]
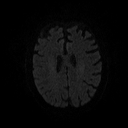
[im 75/100]
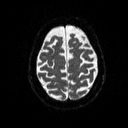
[im 87/100]
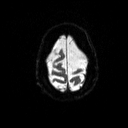
[im 100/100]
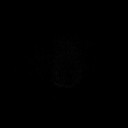

[Series 4: DWI · axial · 3.0mm · 1.80mm/px · z∈[-29,+115]mm · 4 of 49 slices shown (2 of 4)]
[im 1/49]
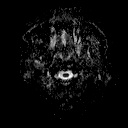
[im 17/49]
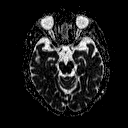
[im 33/49]
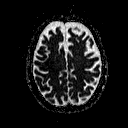
[im 49/49]
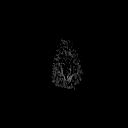

[Series 5: DWI · coronal · 5.0mm · 1.80mm/px · 6 of 68 slices shown (3 of 4)]
[im 1/68]
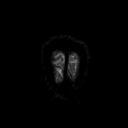
[im 14/68]
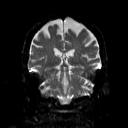
[im 27/68]
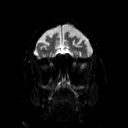
[im 41/68]
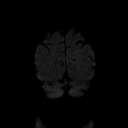
[im 54/68]
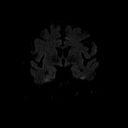
[im 68/68]
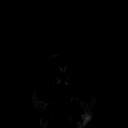

[Series 6: DWI · coronal · 5.0mm · 1.80mm/px · 3 of 34 slices shown (4 of 4)]
[im 1/34]
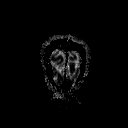
[im 17/34]
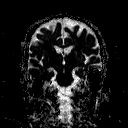
[im 34/34]
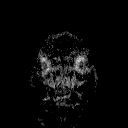

[Series 7: T2 · axial · 5.0mm · 0.51mm/px · z∈[-28,+116]mm · 2 of 22 slices shown (1 of 2)]
[im 1/22]
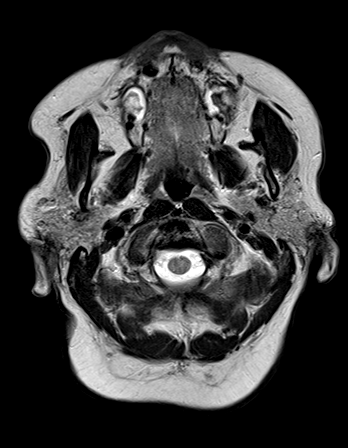
[im 22/22]
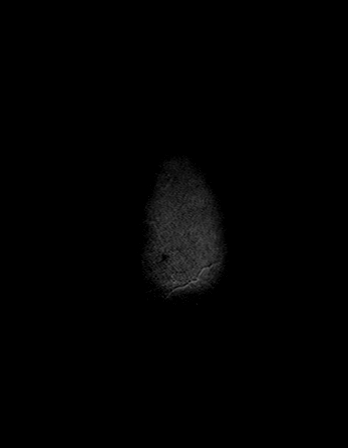

[Series 8: FLAIR · axial · 3.0mm · 0.45mm/px · z∈[-29,+116]mm · 3 of 33 slices shown]
[im 1/33]
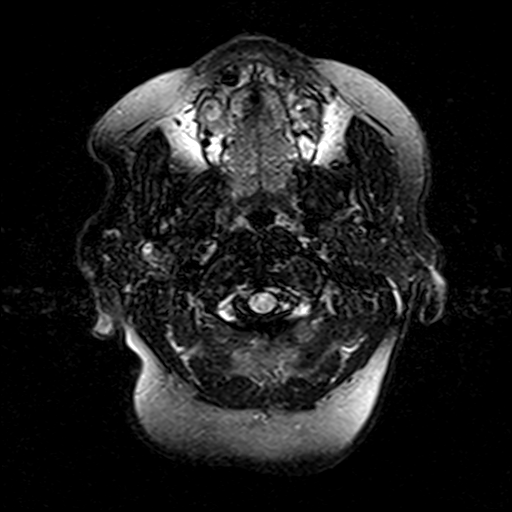
[im 17/33]
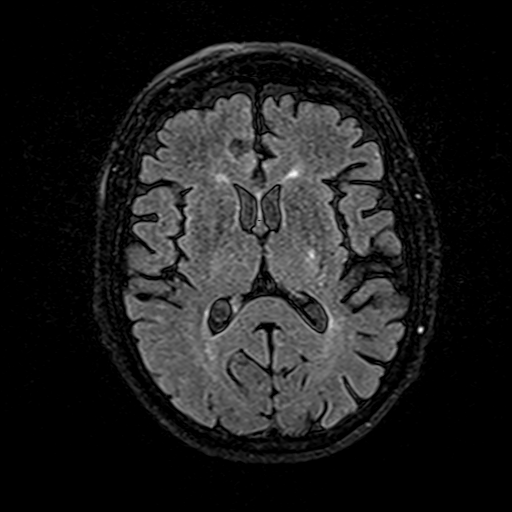
[im 33/33]
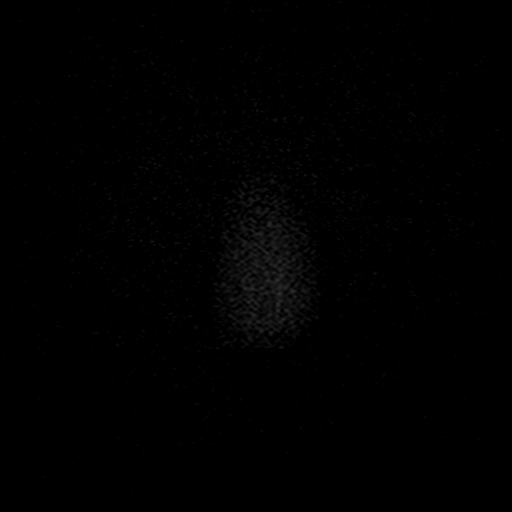

[Series 10: swi_images · axial · 4.0mm · 0.90mm/px · z∈[-25,+112]mm · 3 of 36 slices shown]
[im 1/36]
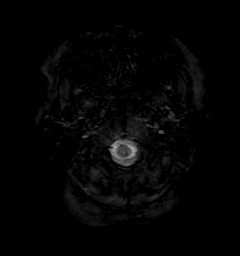
[im 18/36]
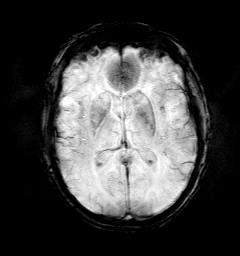
[im 36/36]
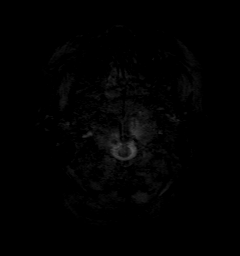

[Series 11: t1_mpr_tra · axial · 1.0mm · 0.71mm/px · z∈[-26,+114]mm · 13 of 144 slices shown]
[im 1/144]
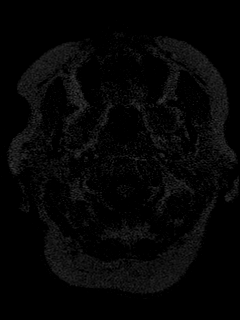
[im 12/144]
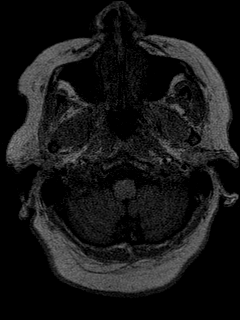
[im 24/144]
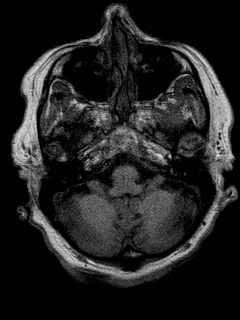
[im 36/144]
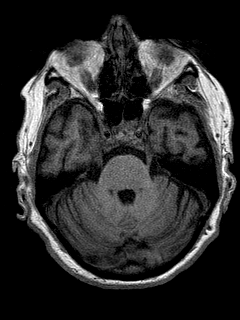
[im 48/144]
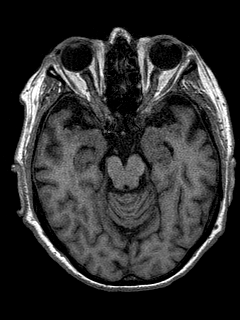
[im 60/144]
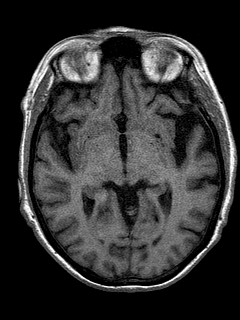
[im 72/144]
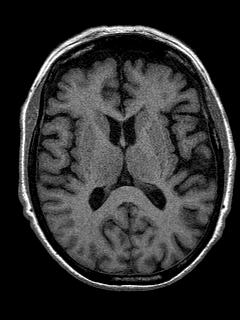
[im 84/144]
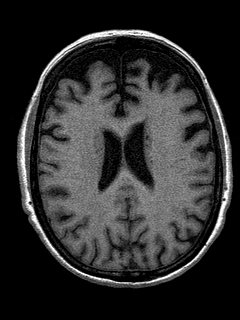
[im 96/144]
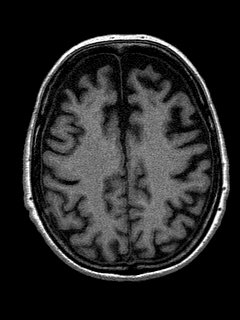
[im 108/144]
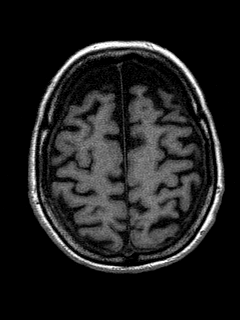
[im 120/144]
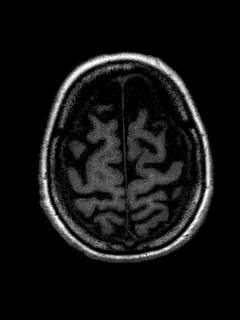
[im 132/144]
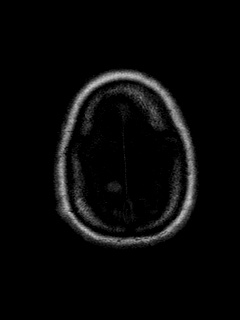
[im 144/144]
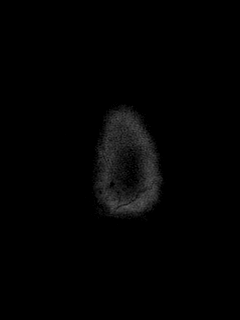

[Series 12: T2 · coronal · 5.0mm · 0.45mm/px · 2 of 25 slices shown (2 of 2)]
[im 1/25]
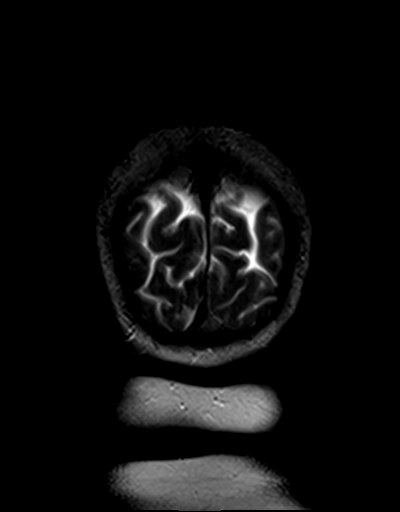
[im 25/25]
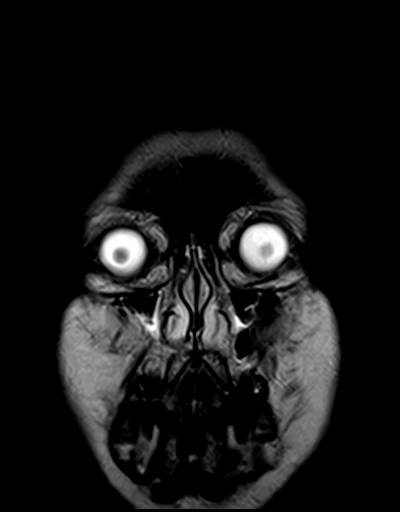

[48 of 48 positions shown; findings below may reference images not displayed]

FINDINGS: Brain:

Frontal lobe predominant cerebral atrophy.

Redemonstrated chronic lacunar infarct within the left corona
radiata/lentiform nucleus.

Mild multifocal T2 FLAIR hyperintense signal abnormality elsewhere
within the cerebral white matter, nonspecific but compatible with
chronic small vessel ischemic disease.

There is no acute infarct.

No evidence of an intracranial mass.

No chronic intracranial blood products.

No extra-axial fluid collection.

No midline shift.

Vascular: Maintained flow voids within the proximal large arterial
vessels.

Skull and upper cervical spine: No focal suspicious marrow lesion.

Sinuses/Orbits: No mass or acute finding within the imaged orbits.
Small mucous retention cyst within the left frontal sinus. Mild
mucosal thickening within the bilateral ethmoid and maxillary
sinuses.
IMPRESSION: 1. No evidence of acute intracranial abnormality.
2. Redemonstrated chronic lacunar infarct within the left corona
radiata/basal ganglia.
3. Background mild cerebral white matter chronic small vessel
ischemic disease, stable.
4. Frontal lobe predominant cerebral atrophy.
5. Mild paranasal sinus disease, as described.

## 2023-06-09 NOTE — Telephone Encounter (Signed)
Copied from CRM [id number]. Topic: Clinical - Prescription Issue >> Jun 09, 2023 10:00 AM Irine Seal wrote: Reason for CRM: Epic not routing Rx refill  Communication Most Recent Primary Care Visit:  Provider: Georgina Quint   Department: [facility]   Visit Type: OFFICE VISIT   Date: 05/05/2023    Medication: amLODipine (NORVASC) 5 MG tablet [    Has the patient contacted their pharmacy? Yes  (Agent: If no, request that the patient contact the pharmacy for the refill. If patient does not wish to contact the pharmacy document the reason why and proceed with request.)  (Agent: If yes, when and what did the pharmacy advise?)    Is this the correct pharmacy for this prescription? Yes  If no, delete pharmacy and type the correct one.  This is the patient's preferred pharmacy:  [id number] G[place] Otto, [place] - [address] AT [facility] [address]  [address] [place] [address]  Phone: [phone] Fax: [phone]      Has the prescription been filled recently? Yes    Is the patient out of the medication? Yes    Has the patient been seen for an appointment in the last year OR does the patient have an upcoming appointment? Yes    Can we respond through MyChart? Yes    Agent: Please be advised that Rx refills may take up to 3 business days. We ask that you follow-up with your pharmacy.

## 2023-06-09 NOTE — Telephone Encounter (Signed)
Copied from CRM [id number]. Topic: Clinical - Medication Refill >> Jun 09, 2023  9:58 AM Irine Seal wrote: Most Recent Primary Care Visit:  Provider: Georgina Quint  Department: [facility]  Visit Type: OFFICE VISIT  Date: 05/05/2023  Medication: ***  Has the patient contacted their pharmacy?  (Agent: If no, request that the patient contact the pharmacy for the refill. If patient does not wish to contact the pharmacy document the reason why and proceed with request.) (Agent: If yes, when and what did the pharmacy advise?)  Is this the correct pharmacy for this prescription?  If no, delete pharmacy and type the correct one.  This is the patient's preferred pharmacy:  [id number] Ginette Otto, [place] - [address] AT [facility] [address] [place] [address] Phone: [phone] Fax: [phone]   Has the prescription been filled recently?   Is the patient out of the medication?   Has the patient been seen for an appointment in the last year OR does the patient have an upcoming appointment?   Can we respond through MyChart?   Agent: Please be advised that Rx refills may take up to 3 business days. We ask that you follow-up with your pharmacy.

## 2023-06-30 ENCOUNTER — Emergency Department (HOSPITAL_COMMUNITY): Payer: BC Managed Care – PPO

## 2023-06-30 ENCOUNTER — Other Ambulatory Visit: Payer: Self-pay

## 2023-06-30 ENCOUNTER — Emergency Department (HOSPITAL_COMMUNITY)
Admission: EM | Admit: 2023-06-30 | Discharge: 2023-06-30 | Discharge status: Against Medical Advice | Payer: BC Managed Care – PPO | Attending: Emergency Medicine | Admitting: Emergency Medicine

## 2023-06-30 ENCOUNTER — Encounter (HOSPITAL_COMMUNITY): Payer: Self-pay

## 2023-06-30 DIAGNOSIS — W01190A Fall on same level from slipping, tripping and stumbling with subsequent striking against furniture, initial encounter: Secondary | ICD-10-CM | POA: Diagnosis not present

## 2023-06-30 DIAGNOSIS — M25461 Effusion, right knee: Secondary | ICD-10-CM | POA: Diagnosis not present

## 2023-06-30 DIAGNOSIS — M25561 Pain in right knee: Secondary | ICD-10-CM | POA: Insufficient documentation

## 2023-06-30 DIAGNOSIS — Z5321 Procedure and treatment not carried out due to patient leaving prior to being seen by health care provider: Secondary | ICD-10-CM | POA: Diagnosis not present

## 2023-06-30 DIAGNOSIS — Z471 Aftercare following joint replacement surgery: Secondary | ICD-10-CM | POA: Diagnosis not present

## 2023-06-30 DIAGNOSIS — M25551 Pain in right hip: Secondary | ICD-10-CM | POA: Diagnosis not present

## 2023-06-30 DIAGNOSIS — Z96643 Presence of artificial hip joint, bilateral: Secondary | ICD-10-CM | POA: Diagnosis not present

## 2023-06-30 DIAGNOSIS — Z043 Encounter for examination and observation following other accident: Secondary | ICD-10-CM | POA: Diagnosis not present

## 2023-06-30 DIAGNOSIS — M1711 Unilateral primary osteoarthritis, right knee: Secondary | ICD-10-CM | POA: Diagnosis not present

## 2023-06-30 NOTE — ED Provider Triage Note (Signed)
 Emergency Medicine Provider Triage Evaluation Note  Katherine Lawrence , a 68 y.o. female  was evaluated in triage.  Pt complains of fall earlier today.  Patient reports that she landed on her right knee and right hip.  Was having some pain in trouble ambulating because of the pain.  Reports of a trip and fall, denies any syncope.  She denies hitting her chest or abdomen or head.  Denies any chest, abdomen, head, or back pain.  Denies any numbness or tingling.  Denies hitting other extremity.  Review of Systems  Positive:  Negative:   Physical Exam  BP (!) 148/83 (BP Location: Left Arm)   Pulse 68   Temp 99.3 F (37.4 C) (Oral)   Resp 18   Ht 5' 4 (1.626 m)   Wt 92.5 kg   LMP 07/07/2011   SpO2 95%   BMI 35.02 kg/m  Gen:   Awake, no distress   Resp:  Normal effort  MSK:   Moves extremities without difficulty  Other:  Has some tenderness to the more lateral aspect.  Complex and extend the knee with some pain.  No tenderness on the pelvis however exam is limited due to triage setting.  Palpable pulses.  Compartments are soft.  Medical Decision Making  Medically screening exam initiated at 3:30 PM.  Appropriate orders placed.  Katherine Lawrence was informed that the remainder of the evaluation will be completed by another provider, this initial triage assessment does not replace that evaluation, and the importance of remaining in the ED until their evaluation is complete.  Will order knee and hip x-ray to rule out referred pain given trouble with ambulation.  The patient is a palpable DP and PT pulse.  Compartments are soft.   Katherine Lawrence, [name] 06/30/23 1531

## 2023-06-30 NOTE — Progress Notes (Signed)
 Subjective:    Patient ID: Katherine Lawrence, female    DOB: 1955/07/15, 68 y.o.   MRN: [id number]      HPI Blondena is here for  Chief Complaint  Patient presents with   Fall    Patient had fall on yesterday landed on right side of her hip  She is here with her husband.   She fell last Friday.  She fell on the right side.  She did injure her right knee.  She went to the emergency room last night and did have some x-rays, but they did not stay to be evaluated.  She did not have right knee pain prior, but now is having pain.  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   Arms ache - she denies achiness at this time, but husband states she was complaining of achiness in the arms.  She has good range of motion and denies having pain.  Increased belching, has increased reflux/heartburn.  Tends to be throughout the day. Not sure if it is worse after eating.    Overdue for routine follow-up.     Medications and allergies reviewed with patient and updated if appropriate.  Current Outpatient Medications on File Prior to Visit  Medication Sig Dispense Refill   amLODipine  (NORVASC ) 5 MG tablet TAKE 1 TABLET(5 MG) BY MOUTH DAILY FOR BLOOD PRESSURE 90 tablet 2   donepezil  (ARICEPT ) 10 MG tablet TAKE 1 TABLET(10 MG) BY MOUTH DAILY 30 tablet 1   meclizine  (ANTIVERT ) 12.5 MG tablet Take 1 to 2 tablets every 6-8 hours as needed for dizziness 30 tablet 0   memantine  (NAMENDA ) 5 MG tablet START MEMANTINE  5MG , TAKE 1 TABLET FOR 2 WEEKS, THEN INCREASE TO 1 TABLET TWICE A DAY 180 tablet 1   metFORMIN  (GLUCOPHAGE -XR) 750 MG 24 hr tablet Take 1 tablet (750 mg total) by mouth daily with breakfast. 90 tablet 1   rosuvastatin  (CRESTOR ) 40 MG tablet Take 1 tablet (40 mg total) by mouth daily. For cholesterol 90 tablet 3   valsartan  (DIOVAN ) 320 MG tablet Take 1 tablet (320 mg total) by mouth daily. 90 tablet 3   No current facility-administered medications on file prior to visit.     Review of Systems  Constitutional:  Negative for fever.  Respiratory:  Positive for cough (dry - occ) and wheezing (occ). Negative for shortness of breath.   Cardiovascular:  Negative for chest pain, palpitations and leg swelling.  Gastrointestinal:  Negative for abdominal pain, constipation, diarrhea and nausea.       Increased belching, some gerd  Neurological:  Positive for light-headedness (occ) and headaches (occ). Negative for dizziness.  Psychiatric/Behavioral:  Negative for sleep disturbance.        Objective:   Vitals:   07/01/23 0920  BP: 136/82  Pulse: 65  Temp: 98.9 F (37.2 C)  SpO2: 98%   BP Readings from Last 3 Encounters:  07/01/23 136/82  06/30/23 (!) 149/83  05/05/23 (!) 142/92   Wt Readings from Last 3 Encounters:  07/01/23 207 lb (93.9 kg)  06/30/23 204 lb (92.5 kg)  05/05/23 205 lb 12.8 oz (93.4 kg)   Body mass index is 35.53 kg/m.    Physical Exam Constitutional:      General: She is not in acute distress.    Appearance: Normal appearance.  HENT:     Head: Normocephalic and atraumatic.  Eyes:     Conjunctiva/sclera: Conjunctivae normal.  Cardiovascular:     Rate and  Rhythm: Normal rate and regular rhythm.     Heart sounds: Normal heart sounds.  Pulmonary:     Effort: Pulmonary effort is normal. No respiratory distress.     Breath sounds: Normal breath sounds. No wheezing.  Musculoskeletal:     Cervical back: Neck supple.     Right lower leg: No edema.     Left lower leg: No edema.  Lymphadenopathy:     Cervical: No cervical adenopathy.  Skin:    General: Skin is warm and dry.     Findings: No rash.  Neurological:     Mental Status: She is alert. Mental status is at baseline.  Psychiatric:        Mood and Affect: Mood normal.        Behavior: Behavior normal.            Assessment & Plan:    See Problem List for Assessment and Plan of chronic medical problems.   Prevnar pneumonia vaccine today

## 2023-06-30 NOTE — ED Triage Notes (Signed)
 Patient reports she tripped and fell hitting a table then the floor. Denies hitting head and does not take blood thinners. C/o right leg pain.

## 2023-06-30 NOTE — ED Notes (Signed)
 Pt did not answer x 3 when called in lobby, assumed LWBS

## 2023-06-30 NOTE — Patient Instructions (Addendum)
    Pneumonia vaccine today   Blood work was ordered.       Medications changes include :   meloxicam  15 mg daily with food x 14 days, omeprazole  20 mg daily for belching, heartburn      Return in about 6 months (around 12/29/2023) for Physical Exam;  cancel 1/24 appt.

## 2023-07-01 ENCOUNTER — Encounter: Payer: BC Managed Care – PPO | Admitting: Internal Medicine

## 2023-07-01 ENCOUNTER — Encounter: Payer: Self-pay | Admitting: Internal Medicine

## 2023-07-01 ENCOUNTER — Ambulatory Visit (INDEPENDENT_AMBULATORY_CARE_PROVIDER_SITE_OTHER): Payer: BC Managed Care – PPO | Admitting: Internal Medicine

## 2023-07-01 VITALS — BP 136/82 | HR 65 | Temp 98.9°F | Ht 64.0 in | Wt 207.0 lb

## 2023-07-01 DIAGNOSIS — E119 Type 2 diabetes mellitus without complications: Secondary | ICD-10-CM | POA: Diagnosis not present

## 2023-07-01 DIAGNOSIS — M25561 Pain in right knee: Secondary | ICD-10-CM

## 2023-07-01 DIAGNOSIS — E538 Deficiency of other specified B group vitamins: Secondary | ICD-10-CM

## 2023-07-01 DIAGNOSIS — E7800 Pure hypercholesterolemia, unspecified: Secondary | ICD-10-CM

## 2023-07-01 DIAGNOSIS — F02B4 Dementia in other diseases classified elsewhere, moderate, with anxiety: Secondary | ICD-10-CM

## 2023-07-01 DIAGNOSIS — E2839 Other primary ovarian failure: Secondary | ICD-10-CM

## 2023-07-01 DIAGNOSIS — G300 Alzheimer's disease with early onset: Secondary | ICD-10-CM

## 2023-07-01 DIAGNOSIS — G8929 Other chronic pain: Secondary | ICD-10-CM | POA: Insufficient documentation

## 2023-07-01 DIAGNOSIS — Z1231 Encounter for screening mammogram for malignant neoplasm of breast: Secondary | ICD-10-CM

## 2023-07-01 DIAGNOSIS — I10 Essential (primary) hypertension: Secondary | ICD-10-CM | POA: Diagnosis not present

## 2023-07-01 DIAGNOSIS — Z7984 Long term (current) use of oral hypoglycemic drugs: Secondary | ICD-10-CM

## 2023-07-01 DIAGNOSIS — Z8673 Personal history of transient ischemic attack (TIA), and cerebral infarction without residual deficits: Secondary | ICD-10-CM

## 2023-07-01 DIAGNOSIS — Z23 Encounter for immunization: Secondary | ICD-10-CM

## 2023-07-01 DIAGNOSIS — R142 Eructation: Secondary | ICD-10-CM

## 2023-07-01 LAB — LIPID PANEL
Cholesterol: 266 mg/dL — ABNORMAL HIGH (ref 0–200)
HDL: 51.6 mg/dL (ref >39.00)
LDL Cholesterol: 200 mg/dL — ABNORMAL HIGH (ref 0–99)
NonHDL: 214.06
Total CHOL/HDL Ratio: 5
Triglycerides: 69 mg/dL (ref 0.0–149.0)
VLDL: 13.8 mg/dL (ref 0.0–40.0)

## 2023-07-01 LAB — COMPREHENSIVE METABOLIC PANEL
ALT: 10 U/L (ref 0–35)
AST: 13 U/L (ref 0–37)
Albumin: 4.6 g/dL (ref 3.5–5.2)
Alkaline Phosphatase: 51 U/L (ref 39–117)
BUN: 23 mg/dL (ref 6–23)
CO2: 31 meq/L (ref 19–32)
Calcium: 10 mg/dL (ref 8.4–10.5)
Chloride: 102 meq/L (ref 96–112)
Creatinine, Ser: 0.88 mg/dL (ref 0.40–1.20)
GFR: 68.98 mL/min (ref >60.00)
Glucose, Bld: 122 mg/dL — ABNORMAL HIGH (ref 70–99)
Potassium: 4.5 meq/L (ref 3.5–5.1)
Sodium: 144 meq/L (ref 135–145)
Total Bilirubin: 0.7 mg/dL (ref 0.2–1.2)
Total Protein: 7 g/dL (ref 6.0–8.3)

## 2023-07-01 LAB — MICROALBUMIN / CREATININE URINE RATIO
Creatinine,U: 122.6 mg/dL
Microalb Creat Ratio: 11.6 mg/g (ref 0.0–30.0)
Microalb, Ur: 14.2 mg/dL — ABNORMAL HIGH (ref 0.0–1.9)

## 2023-07-01 LAB — HEMOGLOBIN A1C: Hgb A1c MFr Bld: 6.5 % (ref 4.6–6.5)

## 2023-07-01 LAB — TSH: TSH: 2.1 u[IU]/mL (ref 0.35–5.50)

## 2023-07-01 LAB — VITAMIN B12: Vitamin B-12: 213 pg/mL (ref 211–911)

## 2023-07-01 MED ORDER — OMEPRAZOLE 20 MG PO CPDR: 20.0000 mg | DELAYED_RELEASE_CAPSULE | Freq: Every day | ORAL | 5 refills | Status: DC

## 2023-07-01 MED ORDER — MELOXICAM 15 MG PO TABS: 15.0000 mg | ORAL_TABLET | Freq: Every day | ORAL | 0 refills | Status: DC

## 2023-07-01 NOTE — Assessment & Plan Note (Signed)
Chronic Following with neurology Continue Aricept 10 mg daily, Namenda 5 mg twice daily

## 2023-07-01 NOTE — Assessment & Plan Note (Signed)
Chronic ?Check B12 level ?

## 2023-07-01 NOTE — Assessment & Plan Note (Signed)
 Chronic   Lab Results  Component Value Date   HGBA1C 6.3 05/05/2023   Sugars controlled She is not monitoring her sugars at home Check A1c, urine microalbumin Continue metformin  XR 750 mg daily  Stressed regular exercise, diabetic diet

## 2023-07-01 NOTE — Assessment & Plan Note (Signed)
 Acute Experiencing increased belching Likely related to GERD-she does state some GERD but is not able to quantify it well Start omeprazole  20 mg daily

## 2023-07-01 NOTE — Assessment & Plan Note (Signed)
Chronic History of CVA-no residual-this was seen on MRI Check lipid panel, CMP Stressed good blood pressure and sugar control Continue Crestor 40 mg daily

## 2023-07-01 NOTE — Assessment & Plan Note (Signed)
 Chronic Regular exercise and healthy diet encouraged Check lipid panel, CMP, TSH Continue Crestor 40 mg daily

## 2023-07-01 NOTE — Assessment & Plan Note (Signed)
 Acute Fell on her knee last week X-ray yesterday in the ED showed the effusion, but no fracture She did ice the knee once, advised her to try icing it at least a couple of times a day to help with the swelling and pain Will start meloxicam  15 mg daily advised to take with food-will only do for 2 weeks Discussed that if there is no improvement we can have her see sports medicine for further evaluation-she does have some underlying arthritis

## 2023-07-01 NOTE — Assessment & Plan Note (Signed)
 Chronic Blood pressure controlled CMP, CBC Continue amlodipine  5 mg daily, valsartan  320 mg daily

## 2023-07-02 NOTE — Addendum Note (Signed)
Addended by: Karma Ganja on: 07/02/2023 09:01 AM   Modules accepted: Orders

## 2023-07-07 ENCOUNTER — Other Ambulatory Visit: Payer: Self-pay

## 2023-07-07 MED ORDER — ROSUVASTATIN CALCIUM 40 MG PO TABS: 40.0000 mg | ORAL_TABLET | Freq: Every day | ORAL | 3 refills | Status: DC

## 2023-07-10 ENCOUNTER — Encounter: Payer: BC Managed Care – PPO | Admitting: Internal Medicine

## 2023-07-23 ENCOUNTER — Ambulatory Visit (INDEPENDENT_AMBULATORY_CARE_PROVIDER_SITE_OTHER): Payer: BC Managed Care – PPO | Admitting: Physician Assistant

## 2023-07-23 ENCOUNTER — Encounter: Payer: Self-pay | Admitting: Physician Assistant

## 2023-07-23 VITALS — BP 161/77 | HR 60 | Resp 20 | Ht 64.0 in | Wt 206.0 lb

## 2023-07-23 DIAGNOSIS — F02B4 Dementia in other diseases classified elsewhere, moderate, with anxiety: Secondary | ICD-10-CM | POA: Diagnosis not present

## 2023-07-23 DIAGNOSIS — G300 Alzheimer's disease with early onset: Secondary | ICD-10-CM

## 2023-07-23 NOTE — Progress Notes (Signed)
 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 RH female with a history of hypertension, arthritis with failed total rib placement, anxiety, depression, hyperlipidemia, DM2, history of CVA 2021 and a diagnosis of moderate early onset dementia due to Alzheimer's disease with behavioral disturbance seen today in follow up for memory loss. Patient is currently on donepezil  10 mg daily and memantine  5 mg twice daily. Discussed with her husband increasing memantine  to 10 mg bid, he agrees.  She needs more assistance with her ADLs.  Mood is controlled by her PCP    Follow up in   months. Continue donepezil  10 mg daily and increase memantine  to 10 mg twice daily, side effects discussed Replenish vitamin B12 (213) Recommend good control of her cardiovascular risk factors Continue to control mood as per PCP     Subjective:    This patient is accompanied in the office by her husband who supplements the history.  Previous records as well as any outside records available were reviewed prior to todays visit. Patient was last seen on 01/20/2023   Any changes in memory since last visit? Worse than before-husband says.  She has more difficulty than before remembering recent information and conversations, finding the right word, provoking frustration. LTM is affected as well.  repeats oneself?  Endorsed Disoriented when walking into a room?  Patient denies    Leaving objects?  May misplace things more frequently   Wandering behavior?  denies   Any personality changes since last visit?  As before, she has moments of irritability. Any worsening depression?:  Denies.   Hallucinations or paranoia?  Denies.   Seizures? denies    Any sleep changes?  Sleeps well, does have vivid dreams, denies REM behavior or sleepwalking   Sleep apnea?   Denies.   Any hygiene concerns? Denies.  Independent  of bathing and dressing?  Endorsed  Does the patient needs help with medications?  Husband is in charge  Who is in charge of the finances?  Husband is in charge    Any changes in appetite?  Denies. Sometimes she goes out for breakfast to [facility] or for lunch with her husband.     Patient have trouble swallowing? Denies.   Does the patient cook? No Any headaches?   denies   Chronic back pain  denies   Ambulates with difficulty?  She has left knee arthritis, followed by orthopedics, uses a cane for stability when needed.   Recent falls or head injuries?  Endorsed.  On June 30, 2023 she had a mechanical fall after tripping and hitting a table and then falling on the floor, but did not have a head injury.  She attributes it to her knees. Unilateral weakness, numbness or tingling? denies   Any tremors?  Denies   Any anosmia?  Denies   Any incontinence of urine?  Endorsed, wears diapers Any bowel dysfunction?   Denies      Patient lives with her husband Does the patient drive? No longer drives     Personally reviewed of the brain 12/03/2021 remarkable for re demonstrated chronic lacunar infarct within the left corona radiata / basal ganglia, stable mild cerebral white matter chronic small vessel ischemic disease, frontal lobe predominant cerebral atrophy.     Neuropsychological evaluation 03/12/21 :  results suggested severe impairment surrounding all aspects of verbal learning and memory. Additional impairments were exhibited across  confrontation naming, verbal fluency (semantic worse than phonemic), and executive functioning (including a task assessing safety and judgment). Regarding etiology, despite [name]'s young age, I unfortunately do have concerns surrounding Alzheimer's disease. She demonstrated prominent verbal memory dysfunction with evidence to suggest a deficit with information storage. She also demonstrated impairments in confrontation naming, semantic fluency, and executive  functioning. Overall, this does represent a fairly classic pattern of dysfunction in earlier stages of this disease process. 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 However, upon review of these images, possible areas represented do not involve the hippocampus or other primary memory centers of the brain. As such, while there may be a mild vascular contribution to her current presentation, a vascular etiology is unlikely to fully account for memory impairment or other areas of dysfunction.   Neuropsychological evaluation 11/20/2022 briefly, results suggested severe impairment surrounding all aspects of learning and memory. Additional severe impairments were exhibited across executive functioning, safety/judgment, receptive language, verbal fluency, and confrontation naming. Processing speed represented a relative weakness, while performance variability was exhibited across visuospatial abilities. Relative to her previous evaluation in September 2022, notable decline was exhibited across a majority of assessed cognitive domains. Decline was more modest across visuospatial abilities and general stability was exhibited across attention/concentration. However, progressive decline was pronounced across processing speed, executive functioning, receptive language, expressive language, and all aspects of learning and memory. Regarding etiology, current testing with evidence for negative progression over time strengthens previous concerns surrounding the presence of a neurodegenerative illness. 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 [name] did not benefit from repeated exposure to information, was fully amnestic (i.e., 0% retention) after brief delays across all memory tasks, and performed very poorly across yes/no recognition trials. Taken together, this suggests rapid forgetting and a severe storage impairment, both of which  represent hallmark characteristics of this illness. Further impairment surrounding semantic fluency, confrontation naming, and executive functioning follows typical disease progression.        [facility] MRI on 04/23/2015 revealed age-advanced cerebral atrophy, most notable in the frontal regions. Brain MRI on 03/23/2019 revealed mild frontal atrophy bilaterally, as well as small deep white matter infarcts in the left hemisphere.  Negative for acute infarct. Mild chronic ischemia left cerebral white matter has developed since 2016. Mild frontal atrophy.     History of Present Illness 04/21/2019:  The patient had a telephone visit on 04/21/2019. She was last seen in the neurology clinic 10 months ago for myalgias. EMG/NCV did not show any evidence of myopathy, blood work no evidence of inflammation. She was seen in 2016 for memory changes. MRI brain in 04/2015 unremarkable. She reports that since her last visit, she is noticing more cognitive changes. She has noticed more hesitation during conversations, she would have to stop and think of what she was going to say. She was laid off in October and offered early retirement, she noticed toward the end that she had to write down everything she was going to say if she had to speak on conference calls. She would go blank when called on to discuss something. She has gotten lost driving, unable to recall how to get to places she has been to for years. She has left the stove on. She infrequently forgets medications. She denies missing bill payments. Sleep and mood are good. She had a knee replacement with continued pain. She denies any headaches, dizziness, vision changes, focal numbness/tingling/weakness. She is not having as much  myalgias as before. She had a repeat MRI brain without contrast done 03/2019 did not show any acute changes. 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          PREVIOUS MEDICATIONS:   CURRENT MEDICATIONS:  Outpatient Encounter Medications as of 07/23/2023  Medication Sig   amLODipine  (NORVASC ) 5 MG tablet TAKE 1 TABLET(5 MG) BY MOUTH DAILY FOR BLOOD PRESSURE   donepezil  (ARICEPT ) 10 MG tablet TAKE 1 TABLET(10 MG) BY MOUTH DAILY   meloxicam  (MOBIC ) 15 MG tablet Take 1 tablet (15 mg total) by mouth daily. Take with food   memantine  (NAMENDA ) 5 MG tablet START MEMANTINE  5MG , TAKE 1 TABLET FOR 2 WEEKS, THEN INCREASE TO 1 TABLET TWICE A DAY   metFORMIN  (GLUCOPHAGE -XR) 750 MG 24 hr tablet Take 1 tablet (750 mg total) by mouth daily with breakfast.   omeprazole  (PRILOSEC) 20 MG capsule Take 1 capsule (20 mg total) by mouth daily.   rosuvastatin  (CRESTOR ) 40 MG tablet Take 1 tablet (40 mg total) by mouth daily. For cholesterol   valsartan  (DIOVAN ) 320 MG tablet Take 1 tablet (320 mg total) by mouth daily.   No facility-administered encounter medications on file as of 07/23/2023.       07/23/2023   12:00 PM 01/20/2023    5:00 PM 07/11/2021    3:00 PM  MMSE - Mini Mental State Exam  Not completed:  --   Orientation to time 2  4  Orientation to Place 3  5  Registration 3  3  Attention/ Calculation 5  5  Recall 0  1  Language- name 2 objects 1  2  Language- repeat 1  1  Language- follow 3 step command 2  3  Language- read & follow direction 1  1  Write a sentence 0  1  Copy design 0  0  Total score 18  26      06/21/2018    9:00 AM 04/13/2015    1:20 PM  Montreal Cognitive Assessment   Visuospatial/ Executive (0/5) 4 3  Naming (0/3) 3 3  Attention: Read list of digits (0/2) 2 2  Attention: Read list of letters (0/1) 1 1  Attention: Serial 7 subtraction starting at 100 (0/3) 3 3  Language: Repeat phrase (0/2) 2 2  Language : Fluency (0/1) 0 0  Abstraction (0/2) 2 2  Delayed Recall (0/5) 0 2  Orientation (0/6) 6 6  Total 23 24  Adjusted Score (based on education)  24    Objective:     PHYSICAL EXAMINATION:    VITALS:   Vitals:    07/23/23 0906 07/23/23 1011  BP: (!) 168/97 (!) 161/77  Pulse: 60   Resp: 20   SpO2: 98%   Weight: 206 lb (93.4 kg)   Height: 5' 4 (1.626 m)     GEN:  The patient appears stated age and is in NAD. HEENT:  Normocephalic, atraumatic.   Neurological examination:  General: NAD, well-groomed, appears stated age. Orientation: The patient is alert. Oriented to person, not to place and date Cranial nerves: There is good facial symmetry.The speech is less fluent than prior, but clear.  No aphasia or dysarthria. Fund of knowledge is reduced. Recent and remote memory are impaired. Attention and concentration are reduced.  Able to name objects and repeat phrases.  Hearing is intact to conversational tone.   Sensation: Sensation is intact to light touch throughout Motor:  Strength is at least antigravity x4. DTR's 2/4 in UE/LE     Movement examination: Tone: There is normal tone in the UE/LE Abnormal movements:  no tremor.  No myoclonus.  No asterixis.   Coordination:  There is no decremation with RAM's. Normal finger to nose  Gait and Station: The patient has no difficulty arising out of a deep-seated chair without the use of the hands. The patient's stride length is good.  Gait is cautious and narrow.    Thank you for allowing us  the opportunity to participate in the care of this nice patient. Please do not hesitate to contact us  for any questions or concerns.   Total time spent on today's visit was 30 minutes dedicated to this patient today, preparing to see patient, examining the patient, ordering tests and/or medications and counseling the patient, documenting clinical information in the EHR or other health record, independently interpreting results and communicating results to the patient/family, discussing treatment and goals, answering patient's questions and coordinating care.  Cc:  Geofm Glade PARAS, MD  Camie Sevin 07/23/2023 12:15 PM

## 2023-07-23 NOTE — Patient Instructions (Addendum)
 It was a pleasure to see you today at our office.   Recommendations:  Meds: Follow up in 6 months Continue Donepezil  10 mg daily.  Side effects discussed   Continue memantine  recommend to increase 10 mg twice a day .   RECOMMENDATIONS FOR ALL PATIENTS WITH MEMORY PROBLEMS: 1. Continue to exercise (Recommend 30 minutes of walking everyday, or 3 hours every week) 2. Increase social interactions - continue going to [place] and enjoy social gatherings with friends and family 3. Eat healthy, avoid fried foods and eat more fruits and vegetables 4. Maintain adequate blood pressure, blood sugar, and blood cholesterol level. Reducing the risk of stroke and cardiovascular disease also helps promoting better memory. 5. Avoid stressful situations. Live a simple life and avoid aggravations. Organize your time and prepare for the next day in anticipation. 6. Sleep well, avoid any interruptions of sleep and avoid any distractions in the bedroom that may interfere with adequate sleep quality 7. Avoid sugar, avoid sweets as there is a strong link between excessive sugar intake, diabetes, and cognitive impairment We discussed the Mediterranean diet, which has been shown to help patients reduce the risk of progressive memory disorders and reduces cardiovascular risk. This includes eating fish, eat fruits and green leafy vegetables, nuts like almonds and hazelnuts, walnuts, and also use olive oil. Avoid fast foods and fried foods as much as possible. Avoid sweets and sugar as sugar use has been linked to worsening of memory function.  There is always a concern of gradual progression of memory problems. If this is the case, then we may need to adjust level of care according to patient needs. Support, both to the patient and caregiver, should then be put into place.    The [employer] is here all day, every day for people facing Alzheimer's disease through our free 24/7 Helpline: ([phone]. The  Helpline provides reliable information and support to all those who need assistance, such as individuals living with memory loss, Alzheimer's or other dementia, caregivers, health care professionals and the public.  Our highly trained and knowledgeable staff can help you with: Understanding memory loss, dementia and Alzheimer's  Medications and other treatment options  General information about aging and brain health  Skills to provide quality care and to find the best care from professionals  Legal, financial and living-arrangement decisions Our Helpline also features: Confidential care consultation provided by master's level clinicians who can help with decision-making support, crisis assistance and education on issues families face every day  Help in a caller's preferred language using our translation service that features more than 200 languages and dialects  Referrals to local community programs, services and ongoing support     FALL PRECAUTIONS: Be cautious when walking. Scan the area for obstacles that may increase the risk of trips and falls. When getting up in the mornings, sit up at the edge of the bed for a few minutes before getting out of bed. Consider elevating the bed at the head end to avoid drop of blood pressure when getting up. Walk always in a well-lit room (use night lights in the walls). Avoid area rugs or power cords from appliances in the middle of the walkways. Use a walker or a cane if necessary and consider physical therapy for balance exercise. Get your eyesight checked regularly.  FINANCIAL OVERSIGHT: Supervision, especially oversight when making financial decisions or transactions is also recommended.  HOME SAFETY: Consider the safety of the kitchen when operating appliances like stoves, microwave oven, and  blender. Consider having supervision and share cooking responsibilities until no longer able to participate in those. Accidents with firearms and other hazards in  the house should be identified and addressed as well.   ABILITY TO BE LEFT ALONE: If patient is unable to contact 911 operator, consider using LifeLine, or when the need is there, arrange for someone to stay with patients. Smoking is a fire hazard, consider supervision or cessation. Risk of wandering should be assessed by caregiver and if detected at any point, supervision and safe proof recommendations should be instituted.  MEDICATION SUPERVISION: Inability to self-administer medication needs to be constantly addressed. Implement a mechanism to ensure safe administration of the medications.   DRIVING: Regarding driving, in patients with progressive memory problems, driving will be impaired. We advise to have someone else do the driving if trouble finding directions or if minor accidents are reported. Independent driving assessment is available to determine safety of driving.   If you are interested in the driving assessment, you can contact the following:  [employer] in [place] [phone]  [facility] [phone]  [facility] [phone] or [phone]      Mediterranean Diet A Mediterranean diet refers to food and lifestyle choices that are based on the traditions of countries located on the [employer]. This way of eating has been shown to help prevent certain conditions and improve outcomes for people who have chronic diseases, like kidney disease and heart disease. What are tips for following this plan? Lifestyle  Cook and eat meals together with your family, when possible. Drink enough fluid to keep your urine clear or pale yellow. Be physically active every day. This includes: Aerobic exercise like running or swimming. Leisure activities like gardening, walking, or housework. Get 7-8 hours of sleep each night. If recommended by your health care provider, drink red wine in moderation. This  means 1 glass a day for nonpregnant women and 2 glasses a day for men. A glass of wine equals 5 oz (150 mL). Reading food labels  Check the serving size of packaged foods. For foods such as rice and pasta, the serving size refers to the amount of cooked product, not dry. Check the total fat in packaged foods. Avoid foods that have saturated fat or trans fats. Check the ingredients list for added sugars, such as corn syrup. Shopping  At the grocery store, buy most of your food from the areas near the walls of the store. This includes: Fresh fruits and vegetables (produce). Grains, beans, nuts, and seeds. Some of these may be available in unpackaged forms or large amounts (in bulk). Fresh seafood. Poultry and eggs. Low-fat dairy products. Buy whole ingredients instead of prepackaged foods. Buy fresh fruits and vegetables in-season from local farmers markets. Buy frozen fruits and vegetables in resealable bags. If you do not have access to quality fresh seafood, buy precooked frozen shrimp or canned fish, such as tuna, salmon, or sardines. Buy small amounts of raw or cooked vegetables, salads, or olives from the deli or salad bar at your store. Stock your pantry so you always have certain foods on hand, such as olive oil, canned tuna, canned tomatoes, rice, pasta, and beans. Cooking  Cook foods with extra-virgin olive oil instead of using butter or other vegetable oils. Have meat as a side dish, and have vegetables or grains as your main dish. This means having meat in small portions or adding small amounts of meat to foods like  pasta or stew. Use beans or vegetables instead of meat in common dishes like chili or lasagna. Experiment with different cooking methods. Try roasting or broiling vegetables instead of steaming or sauteing them. Add frozen vegetables to soups, stews, pasta, or rice. Add nuts or seeds for added healthy fat at each meal. You can add these to yogurt, salads, or vegetable  dishes. Marinate fish or vegetables using olive oil, lemon juice, garlic, and fresh herbs. Meal planning  Plan to eat 1 vegetarian meal one day each week. Try to work up to 2 vegetarian meals, if possible. Eat seafood 2 or more times a week. Have healthy snacks readily available, such as: Vegetable sticks with hummus. Greek yogurt. Fruit and nut trail mix. Eat balanced meals throughout the week. This includes: Fruit: 2-3 servings a day Vegetables: 4-5 servings a day Low-fat dairy: 2 servings a day Fish, poultry, or lean meat: 1 serving a day Beans and legumes: 2 or more servings a week Nuts and seeds: 1-2 servings a day Whole grains: 6-8 servings a day Extra-virgin olive oil: 3-4 servings a day Limit red meat and sweets to only a few servings a month What are my food choices? Mediterranean diet Recommended Grains: Whole-grain pasta. Brown rice. Bulgar wheat. Polenta. Couscous. Whole-wheat bread. [place] Made[place]. Vegetables: Artichokes. Beets. Broccoli. Cabbage. Carrots. Eggplant. Green beans. Chard. Kale. Spinach. Onions. Leeks. Peas. Squash. Tomatoes. Peppers. Radishes. Fruits: Apples. Apricots. Avocado. Berries. Bananas. Cherries. Dates. Figs. Grapes. Lemons. Melon. Oranges. Peaches. Plums. Pomegranate. Meats and other protein foods: Beans. Almonds. Sunflower seeds. Pine nuts. Peanuts. Cod. Salmon. Scallops. Shrimp. Tuna. Tilapia. Clams. Oysters. Eggs. Dairy: Low-fat milk. Cheese. Greek yogurt. Beverages: Water . Red wine. Herbal tea. Fats and oils: Extra virgin olive oil. Avocado oil. Grape seed oil. Sweets and desserts: Greek yogurt with honey. Baked apples. Poached pears. Trail mix. Seasoning and other foods: Basil. Cilantro. Coriander. Cumin. Mint. Parsley. Sage. Rosemary. Tarragon. Garlic. Oregano. Thyme. Pepper. Balsalmic vinegar. Tahini. Hummus. Tomato sauce. Olives. Mushrooms. Limit these Grains: Prepackaged pasta or rice dishes. Prepackaged cereal with added  sugar. Vegetables: Deep fried potatoes (french fries). Fruits: Fruit canned in syrup. Meats and other protein foods: Beef. Pork. Lamb. Poultry with skin. Hot dogs. [place]. Dairy: Ice cream. Sour cream. Whole milk. Beverages: Juice. Sugar-sweetened soft drinks. Beer. Liquor and spirits. Fats and oils: Butter. Canola oil. Vegetable oil. Beef fat (tallow). Lard. Sweets and desserts: Cookies. Cakes. Pies. Candy. Seasoning and other foods: Mayonnaise. Premade sauces and marinades. The items listed may not be a complete list. Talk with your dietitian about what dietary choices are right for you. Summary The Mediterranean diet includes both food and lifestyle choices. Eat a variety of fresh fruits and vegetables, beans, nuts, seeds, and whole grains. Limit the amount of red meat and sweets that you eat. Talk with your health care provider about whether it is safe for you to drink red wine in moderation. This means 1 glass a day for nonpregnant women and 2 glasses a day for men. A glass of wine equals 5 oz (150 mL). This information is not intended to replace advice given to you by your health care provider. Make sure you discuss any questions you have with your health care provider. Document Released: 01/24/2016 Document Revised: 02/26/2016 Document Reviewed: 01/24/2016 Elsevier Interactive Patient Education  2017 [employer].

## 2023-08-01 DIAGNOSIS — Z20822 Contact with and (suspected) exposure to covid-19: Secondary | ICD-10-CM | POA: Diagnosis not present

## 2023-08-01 DIAGNOSIS — N3001 Acute cystitis with hematuria: Secondary | ICD-10-CM | POA: Diagnosis not present

## 2023-08-01 DIAGNOSIS — R5383 Other fatigue: Secondary | ICD-10-CM | POA: Diagnosis not present

## 2023-08-01 DIAGNOSIS — R1030 Lower abdominal pain, unspecified: Secondary | ICD-10-CM | POA: Diagnosis not present

## 2023-08-05 ENCOUNTER — Ambulatory Visit: Payer: BC Managed Care – PPO | Admitting: Internal Medicine

## 2023-08-09 NOTE — Progress Notes (Unsigned)
    Subjective:    Patient ID: Katherine Lawrence, female    DOB: [birth year], 68 y.o.   MRN: [id number]      HPI Katherine Lawrence is here for No chief complaint on file.    ? UTI -     Medications and allergies reviewed with patient and updated if appropriate.  Current Outpatient Medications on File Prior to Visit  Medication Sig Dispense Refill   amLODipine (NORVASC) 5 MG tablet TAKE 1 TABLET(5 MG) BY MOUTH DAILY FOR BLOOD PRESSURE 90 tablet 2   donepezil (ARICEPT) 10 MG tablet TAKE 1 TABLET(10 MG) BY MOUTH DAILY 30 tablet 1   meloxicam (MOBIC) 15 MG tablet Take 1 tablet (15 mg total) by mouth daily. Take with food 14 tablet 0   memantine (NAMENDA) 5 MG tablet START MEMANTINE 5MG , TAKE 1 TABLET FOR 2 WEEKS, THEN INCREASE TO 1 TABLET TWICE A DAY 180 tablet 1   metFORMIN (GLUCOPHAGE-XR) 750 MG 24 hr tablet Take 1 tablet (750 mg total) by mouth daily with breakfast. 90 tablet 1   omeprazole (PRILOSEC) 20 MG capsule Take 1 capsule (20 mg total) by mouth daily. 30 capsule 5   rosuvastatin (CRESTOR) 40 MG tablet Take 1 tablet (40 mg total) by mouth daily. For cholesterol 90 tablet 3   valsartan (DIOVAN) 320 MG tablet Take 1 tablet (320 mg total) by mouth daily. 90 tablet 3   No current facility-administered medications on file prior to visit.    Review of Systems     Objective:  There were no vitals filed for this visit. BP Readings from Last 3 Encounters:  07/23/23 (!) 161/77  07/01/23 136/82  06/30/23 (!) 149/83   Wt Readings from Last 3 Encounters:  07/23/23 206 lb (93.4 kg)  07/01/23 207 lb (93.9 kg)  06/30/23 204 lb (92.5 kg)   There is no height or weight on file to calculate BMI.    Physical Exam         Assessment & Plan:    See Problem List for Assessment and Plan of chronic medical problems.

## 2023-08-10 ENCOUNTER — Other Ambulatory Visit: Payer: Self-pay | Admitting: Physician Assistant

## 2023-08-10 ENCOUNTER — Encounter: Payer: Self-pay | Admitting: Internal Medicine

## 2023-08-10 ENCOUNTER — Ambulatory Visit (INDEPENDENT_AMBULATORY_CARE_PROVIDER_SITE_OTHER): Payer: BC Managed Care – PPO | Admitting: Internal Medicine

## 2023-08-10 VITALS — BP 136/84 | HR 80 | Temp 98.5°F | Ht 64.0 in | Wt 197.0 lb

## 2023-08-10 DIAGNOSIS — R5383 Other fatigue: Secondary | ICD-10-CM | POA: Diagnosis not present

## 2023-08-10 DIAGNOSIS — I10 Essential (primary) hypertension: Secondary | ICD-10-CM

## 2023-08-10 DIAGNOSIS — N3000 Acute cystitis without hematuria: Secondary | ICD-10-CM

## 2023-08-10 DIAGNOSIS — N390 Urinary tract infection, site not specified: Secondary | ICD-10-CM | POA: Insufficient documentation

## 2023-08-10 NOTE — Assessment & Plan Note (Signed)
 Chronic Blood pressure controlled Continue amlodipine 5 mg daily, valsartan 320 mg daily

## 2023-08-10 NOTE — Assessment & Plan Note (Signed)
 New States fatigue Which started around 2/3 Also has some decreased appetite and some associated weight loss At urgent care COVID and flu test were negative  Currently has some fatigue and decreased appetite-no other concerning symptoms ?  Cause Would expect fatigue and appetite to improve if it is related to an acute infection Will rule out UTI If not improving will require further evaluation

## 2023-08-10 NOTE — Patient Instructions (Addendum)
      Give a urine sample today at the lab.      Medications changes include :   None      Return if symptoms worsen or fail to improve.

## 2023-08-10 NOTE — Assessment & Plan Note (Signed)
 Urgent care 2/5-urine dip suggestive of possible UTI, but she did not give enough urine at that time to send for culture Abdominal pain has resolved No current abdominal pain or urine symptoms Will recheck urine to rule out UTI-was not able to give a sample today so was given supplies and will give a sample at home and bring it in No treatment unless culture shows infection

## 2023-08-11 ENCOUNTER — Other Ambulatory Visit (INDEPENDENT_AMBULATORY_CARE_PROVIDER_SITE_OTHER): Payer: BC Managed Care – PPO

## 2023-08-11 ENCOUNTER — Other Ambulatory Visit: Payer: Self-pay | Admitting: Internal Medicine

## 2023-08-11 DIAGNOSIS — R350 Frequency of micturition: Secondary | ICD-10-CM | POA: Diagnosis not present

## 2023-08-12 ENCOUNTER — Encounter: Payer: Self-pay | Admitting: Internal Medicine

## 2023-08-12 LAB — URINALYSIS, ROUTINE W REFLEX MICROSCOPIC
Bilirubin Urine: NEGATIVE
Ketones, ur: NEGATIVE
Nitrite: NEGATIVE
Specific Gravity, Urine: 1.015 (ref 1.000–1.030)
Total Protein, Urine: 30 — AB
Urine Glucose: NEGATIVE
Urobilinogen, UA: 0.2 (ref 0.0–1.0)
pH: 6 (ref 5.0–8.0)

## 2023-08-12 LAB — URINE CULTURE

## 2023-08-13 ENCOUNTER — Ambulatory Visit: Payer: BC Managed Care – PPO | Admitting: Internal Medicine

## 2023-09-02 ENCOUNTER — Other Ambulatory Visit: Payer: Self-pay | Admitting: Internal Medicine

## 2023-09-04 DIAGNOSIS — M1711 Unilateral primary osteoarthritis, right knee: Secondary | ICD-10-CM | POA: Diagnosis not present

## 2023-09-04 DIAGNOSIS — M25561 Pain in right knee: Secondary | ICD-10-CM | POA: Diagnosis not present

## 2023-09-09 ENCOUNTER — Ambulatory Visit
Admission: RE | Admit: 2023-09-09 | Discharge: 2023-09-09 | Disposition: A | Discharge status: Home / Self Care | Source: Ambulatory Visit | Attending: Internal Medicine

## 2023-09-09 DIAGNOSIS — E2839 Other primary ovarian failure: Secondary | ICD-10-CM | POA: Diagnosis not present

## 2023-09-12 ENCOUNTER — Encounter: Payer: Self-pay | Admitting: Internal Medicine

## 2023-10-02 ENCOUNTER — Other Ambulatory Visit: Payer: Self-pay | Admitting: Physician Assistant

## 2023-10-05 ENCOUNTER — Telehealth: Payer: Self-pay | Admitting: Physician Assistant

## 2023-10-05 ENCOUNTER — Other Ambulatory Visit: Payer: Self-pay

## 2023-10-05 NOTE — Telephone Encounter (Signed)
 1. Which medications need refilled? (List name and dosage, if known) left message over weekend needs memantine  & donepezil   2. Which pharmacy/location is medication to be sent to? (include street and city if local pharmacy) did not specify

## 2023-10-06 DIAGNOSIS — M1711 Unilateral primary osteoarthritis, right knee: Secondary | ICD-10-CM | POA: Diagnosis not present

## 2023-10-06 DIAGNOSIS — R41840 Attention and concentration deficit: Secondary | ICD-10-CM | POA: Insufficient documentation

## 2023-10-06 DIAGNOSIS — Z96652 Presence of left artificial knee joint: Secondary | ICD-10-CM | POA: Diagnosis not present

## 2023-10-06 MED ORDER — MEMANTINE HCL 5 MG PO TABS: ORAL_TABLET | ORAL | 1 refills | Status: DC

## 2023-10-06 MED ORDER — DONEPEZIL HCL 10 MG PO TABS: ORAL_TABLET | ORAL | 1 refills | Status: DC

## 2023-10-08 ENCOUNTER — Other Ambulatory Visit: Payer: Self-pay | Admitting: Physician Assistant

## 2023-10-08 MED ORDER — MEMANTINE HCL 10 MG PO TABS: ORAL_TABLET | ORAL | 3 refills | Status: AC

## 2023-10-08 NOTE — Telephone Encounter (Signed)
 Pt's husband called in stating the pt has been out of her Memantine  since Saturday and will be running out of donepezil  soon. The memantine  dosage was changed, but the RX was not changed at pharmacy. Would like refills sent to [facility] [address]

## 2023-11-20 ENCOUNTER — Other Ambulatory Visit: Payer: Self-pay | Admitting: Internal Medicine

## 2023-11-23 ENCOUNTER — Other Ambulatory Visit: Payer: Self-pay | Admitting: Internal Medicine

## 2023-12-02 ENCOUNTER — Encounter: Payer: Self-pay | Admitting: Internal Medicine

## 2023-12-02 ENCOUNTER — Other Ambulatory Visit: Payer: Self-pay | Admitting: Internal Medicine

## 2023-12-02 ENCOUNTER — Other Ambulatory Visit: Payer: Self-pay

## 2023-12-02 MED ORDER — METFORMIN HCL ER 750 MG PO TB24: 750.0000 mg | ORAL_TABLET | Freq: Every day | ORAL | 1 refills | Status: DC

## 2023-12-09 ENCOUNTER — Ambulatory Visit (INDEPENDENT_AMBULATORY_CARE_PROVIDER_SITE_OTHER): Admitting: Internal Medicine

## 2023-12-09 ENCOUNTER — Ambulatory Visit: Payer: Self-pay | Admitting: Internal Medicine

## 2023-12-09 ENCOUNTER — Ambulatory Visit (INDEPENDENT_AMBULATORY_CARE_PROVIDER_SITE_OTHER): Admitting: Physician Assistant

## 2023-12-09 VITALS — BP 132/72 | HR 71 | Temp 98.1°F | Ht 64.0 in | Wt 198.0 lb

## 2023-12-09 VITALS — BP 166/95 | HR 75 | Ht 64.0 in | Wt 198.0 lb

## 2023-12-09 DIAGNOSIS — E538 Deficiency of other specified B group vitamins: Secondary | ICD-10-CM | POA: Diagnosis not present

## 2023-12-09 DIAGNOSIS — E7800 Pure hypercholesterolemia, unspecified: Secondary | ICD-10-CM

## 2023-12-09 DIAGNOSIS — M1712 Unilateral primary osteoarthritis, left knee: Secondary | ICD-10-CM | POA: Diagnosis not present

## 2023-12-09 DIAGNOSIS — G300 Alzheimer's disease with early onset: Secondary | ICD-10-CM

## 2023-12-09 DIAGNOSIS — E119 Type 2 diabetes mellitus without complications: Secondary | ICD-10-CM

## 2023-12-09 DIAGNOSIS — M1711 Unilateral primary osteoarthritis, right knee: Secondary | ICD-10-CM

## 2023-12-09 DIAGNOSIS — M25512 Pain in left shoulder: Secondary | ICD-10-CM | POA: Diagnosis not present

## 2023-12-09 DIAGNOSIS — I10 Essential (primary) hypertension: Secondary | ICD-10-CM

## 2023-12-09 DIAGNOSIS — F02B4 Dementia in other diseases classified elsewhere, moderate, with anxiety: Secondary | ICD-10-CM

## 2023-12-09 DIAGNOSIS — R142 Eructation: Secondary | ICD-10-CM

## 2023-12-09 LAB — COMPREHENSIVE METABOLIC PANEL WITH GFR
ALT: 10 U/L (ref 0–35)
AST: 15 U/L (ref 0–37)
Albumin: 4.4 g/dL (ref 3.5–5.2)
Alkaline Phosphatase: 50 U/L (ref 39–117)
BUN: 18 mg/dL (ref 6–23)
CO2: 31 meq/L (ref 19–32)
Calcium: 9.5 mg/dL (ref 8.4–10.5)
Chloride: 103 meq/L (ref 96–112)
Creatinine, Ser: 0.9 mg/dL (ref 0.40–1.20)
GFR: 66.94 mL/min (ref >60.00)
Glucose, Bld: 93 mg/dL (ref 70–99)
Potassium: 3.7 meq/L (ref 3.5–5.1)
Sodium: 142 meq/L (ref 135–145)
Total Bilirubin: 0.4 mg/dL (ref 0.2–1.2)
Total Protein: 7.1 g/dL (ref 6.0–8.3)

## 2023-12-09 LAB — CBC WITH DIFFERENTIAL/PLATELET
Basophils Absolute: 0 10*3/uL (ref 0.0–0.1)
Basophils Relative: 0.3 % (ref 0.0–3.0)
Eosinophils Absolute: 0.2 10*3/uL (ref 0.0–0.7)
Eosinophils Relative: 2.7 % (ref 0.0–5.0)
HCT: 36.8 % (ref 36.0–46.0)
Hemoglobin: 12.2 g/dL (ref 12.0–15.0)
Lymphocytes Relative: 22 % (ref 12.0–46.0)
Lymphs Abs: 2 10*3/uL (ref 0.7–4.0)
MCHC: 33.2 g/dL (ref 30.0–36.0)
MCV: 91.1 fl (ref 78.0–100.0)
Monocytes Absolute: 0.6 10*3/uL (ref 0.1–1.0)
Monocytes Relative: 6.4 % (ref 3.0–12.0)
Neutro Abs: 6.2 10*3/uL (ref 1.4–7.7)
Neutrophils Relative %: 68.6 % (ref 43.0–77.0)
Platelets: 264 10*3/uL (ref 150.0–400.0)
RBC: 4.04 Mil/uL (ref 3.87–5.11)
RDW: 13.6 % (ref 11.5–15.5)
WBC: 9.1 10*3/uL (ref 4.0–10.5)

## 2023-12-09 LAB — LIPID PANEL
Cholesterol: 170 mg/dL (ref 0–200)
HDL: 52.2 mg/dL (ref >39.00)
LDL Cholesterol: 88 mg/dL (ref 0–99)
NonHDL: 117.74
Total CHOL/HDL Ratio: 3
Triglycerides: 149 mg/dL (ref 0.0–149.0)
VLDL: 29.8 mg/dL (ref 0.0–40.0)

## 2023-12-09 LAB — TSH: TSH: 2.94 u[IU]/mL (ref 0.35–5.50)

## 2023-12-09 LAB — VITAMIN B12: Vitamin B-12: 564 pg/mL (ref 211–911)

## 2023-12-09 LAB — HEMOGLOBIN A1C: Hgb A1c MFr Bld: 5.9 % (ref 4.6–6.5)

## 2023-12-09 LAB — VITAMIN D 25 HYDROXY (VIT D DEFICIENCY, FRACTURES): VITD: 8.82 ng/mL — ABNORMAL LOW (ref 30.00–100.00)

## 2023-12-09 NOTE — Assessment & Plan Note (Signed)
 Chronic   Lab Results  Component Value Date   HGBA1C 5.9 12/09/2023   Sugars controlled She is not monitoring her sugars at home Check A1c Continue metformin  XR 750 mg daily  Stressed regular exercise, diabetic diet

## 2023-12-09 NOTE — Assessment & Plan Note (Signed)
 New No obvious injury Possible rotator cuff injury Pain with certain movements, decreased range of motion Referral to sports medicine

## 2023-12-09 NOTE — Patient Instructions (Addendum)
     Make an appointment for sports medicine downstairs.    Medications changes include :   None    A referral was ordered for sports medicine and someone will call you to schedule an appointment.     Return in about 6 months (around 06/09/2024) for follow up.

## 2023-12-09 NOTE — Patient Instructions (Addendum)
 It was a pleasure to see you today at our office.   Recommendations:  Meds: Follow up in 6 months Continue Donepezil  10 mg daily.  Side effects discussed  Memantine  10 mg twice a day .   RECOMMENDATIONS FOR ALL PATIENTS WITH MEMORY PROBLEMS: 1. Continue to exercise (Recommend 30 minutes of walking everyday, or 3 hours every week) 2. Increase social interactions - continue going to [place] and enjoy social gatherings with friends and family 3. Eat healthy, avoid fried foods and eat more fruits and vegetables 4. Maintain adequate blood pressure, blood sugar, and blood cholesterol level. Reducing the risk of stroke and cardiovascular disease also helps promoting better memory. 5. Avoid stressful situations. Live a simple life and avoid aggravations. Organize your time and prepare for the next day in anticipation. 6. Sleep well, avoid any interruptions of sleep and avoid any distractions in the bedroom that may interfere with adequate sleep quality 7. Avoid sugar, avoid sweets as there is a strong link between excessive sugar intake, diabetes, and cognitive impairment We discussed the Mediterranean diet, which has been shown to help patients reduce the risk of progressive memory disorders and reduces cardiovascular risk. This includes eating fish, eat fruits and green leafy vegetables, nuts like almonds and hazelnuts, walnuts, and also use olive oil. Avoid fast foods and fried foods as much as possible. Avoid sweets and sugar as sugar use has been linked to worsening of memory function.  There is always a concern of gradual progression of memory problems. If this is the case, then we may need to adjust level of care according to patient needs. Support, both to the patient and caregiver, should then be put into place.    The Alzheimer[employer] Association is here all day, every day for people facing Alzheimer's disease through our free 24/7 Helpline: [phone])420-2880. The Helpline provides reliable  information and support to all those who need assistance, such as individuals living with memory loss, Alzheimer's or other dementia, caregivers, health care professionals and the public.  Our highly trained and knowledgeable staff can help you with: Understanding memory loss, dementia and Alzheimer's  Medications and other treatment options  General information about aging and brain health  Skills to provide quality care and to find the best care from professionals  Legal, financial and living-arrangement decisions Our Helpline also features: Confidential care consultation provided by master's level clinicians who can help with decision-making support, crisis assistance and education on issues families face every day  Help in a caller's preferred language using our translation service that features more than 200 languages and dialects  Referrals to local community programs, services and ongoing support     FALL PRECAUTIONS: Be cautious when walking. Scan the area for obstacles that may increase the risk of trips and falls. When getting up in the mornings, sit up at the edge of the bed for a few minutes before getting out of bed. Consider elevating the bed at the head end to avoid drop of blood pressure when getting up. Walk always in a well-lit room (use night lights in the walls). Avoid area rugs or power cords from appliances in the middle of the walkways. Use a walker or a cane if necessary and consider physical therapy for balance exercise. Get your eyesight checked regularly.  FINANCIAL OVERSIGHT: Supervision, especially oversight when making financial decisions or transactions is also recommended.  HOME SAFETY: Consider the safety of the kitchen when operating appliances like stoves, microwave oven, and blender. Consider having supervision and  share cooking responsibilities until no longer able to participate in those. Accidents with firearms and other hazards in the house should be  identified and addressed as well.   ABILITY TO BE LEFT ALONE: If patient is unable to contact 911 operator, consider using LifeLine, or when the need is there, arrange for someone to stay with patients. Smoking is a fire hazard, consider supervision or cessation. Risk of wandering should be assessed by caregiver and if detected at any point, supervision and safe proof recommendations should be instituted.  MEDICATION SUPERVISION: Inability to self-administer medication needs to be constantly addressed. Implement a mechanism to ensure safe administration of the medications.   DRIVING: Regarding driving, in patients with progressive memory problems, driving will be impaired. We advise to have someone else do the driving if trouble finding directions or if minor accidents are reported. Independent driving assessment is available to determine safety of driving.   If you are interested in the driving assessment, you can contact the following:  [employer] in [place] [phone]  [facility] [phone]  [facility] [phone] or [phone]      Mediterranean Diet A Mediterranean diet refers to food and lifestyle choices that are based on the traditions of countries located on the [employer]. This way of eating has been shown to help prevent certain conditions and improve outcomes for people who have chronic diseases, like kidney disease and heart disease. What are tips for following this plan? Lifestyle  Cook and eat meals together with your family, when possible. Drink enough fluid to keep your urine clear or pale yellow. Be physically active every day. This includes: Aerobic exercise like running or swimming. Leisure activities like gardening, walking, or housework. Get 7-8 hours of sleep each night. If recommended by your health care provider, drink red wine in moderation. This means 1 glass a day  for nonpregnant women and 2 glasses a day for men. A glass of wine equals 5 oz (150 mL). Reading food labels  Check the serving size of packaged foods. For foods such as rice and pasta, the serving size refers to the amount of cooked product, not dry. Check the total fat in packaged foods. Avoid foods that have saturated fat or trans fats. Check the ingredients list for added sugars, such as corn syrup. Shopping  At the grocery store, buy most of your food from the areas near the walls of the store. This includes: Fresh fruits and vegetables (produce). Grains, beans, nuts, and seeds. Some of these may be available in unpackaged forms or large amounts (in bulk). Fresh seafood. Poultry and eggs. Low-fat dairy products. Buy whole ingredients instead of prepackaged foods. Buy fresh fruits and vegetables in-season from local farmers markets. Buy frozen fruits and vegetables in resealable bags. If you do not have access to quality fresh seafood, buy precooked frozen shrimp or canned fish, such as tuna, salmon, or sardines. Buy small amounts of raw or cooked vegetables, salads, or olives from the deli or salad bar at your store. Stock your pantry so you always have certain foods on hand, such as olive oil, canned tuna, canned tomatoes, rice, pasta, and beans. Cooking  Cook foods with extra-virgin olive oil instead of using butter or other vegetable oils. Have meat as a side dish, and have vegetables or grains as your main dish. This means having meat in small portions or adding small amounts of meat to foods like pasta or stew. Use beans  or vegetables instead of meat in common dishes like chili or lasagna. Experiment with different cooking methods. Try roasting or broiling vegetables instead of steaming or sauteing them. Add frozen vegetables to soups, stews, pasta, or rice. Add nuts or seeds for added healthy fat at each meal. You can add these to yogurt, salads, or vegetable dishes. Marinate  fish or vegetables using olive oil, lemon juice, garlic, and fresh herbs. Meal planning  Plan to eat 1 vegetarian meal one day each week. Try to work up to 2 vegetarian meals, if possible. Eat seafood 2 or more times a week. Have healthy snacks readily available, such as: Vegetable sticks with hummus. Greek yogurt. Fruit and nut trail mix. Eat balanced meals throughout the week. This includes: Fruit: 2-3 servings a day Vegetables: 4-5 servings a day Low-fat dairy: 2 servings a day Fish, poultry, or lean meat: 1 serving a day Beans and legumes: 2 or more servings a week Nuts and seeds: 1-2 servings a day Whole grains: 6-8 servings a day Extra-virgin olive oil: 3-4 servings a day Limit red meat and sweets to only a few servings a month What are my food choices? Mediterranean diet Recommended Grains: Whole-grain pasta. Brown rice. Bulgar wheat. Polenta. Couscous. Whole-wheat bread. [place] Made[place]. Vegetables: Artichokes. Beets. Broccoli. Cabbage. Carrots. Eggplant. Green beans. Chard. Kale. Spinach. Onions. Leeks. Peas. Squash. Tomatoes. Peppers. Radishes. Fruits: Apples. Apricots. Avocado. Berries. Bananas. Cherries. Dates. Figs. Grapes. Lemons. Melon. Oranges. Peaches. Plums. Pomegranate. Meats and other protein foods: Beans. Almonds. Sunflower seeds. Pine nuts. Peanuts. Cod. Salmon. Scallops. Shrimp. Tuna. Tilapia. Clams. Oysters. Eggs. Dairy: Low-fat milk. Cheese. Greek yogurt. Beverages: Water . Red wine. Herbal tea. Fats and oils: Extra virgin olive oil. Avocado oil. Grape seed oil. Sweets and desserts: [place] yogurt with honey. Baked apples. Poached pears. Trail mix. Seasoning and other foods: Basil. Cilantro. Coriander. Cumin. Mint. Parsley. Sage. Rosemary. Tarragon. Garlic. Oregano. Thyme. Pepper. Balsalmic vinegar. Tahini. Hummus. Tomato sauce. Olives. Mushrooms. Limit these Grains: Prepackaged pasta or rice dishes. Prepackaged cereal with added sugar. Vegetables: Deep fried  potatoes (french fries). Fruits: Fruit canned in syrup. Meats and other protein foods: Beef. Pork. Lamb. Poultry with skin. Hot dogs. [place]. Dairy: Ice cream. Sour cream. Whole milk. Beverages: Juice. Sugar-sweetened soft drinks. Beer. Liquor and spirits. Fats and oils: Butter. Canola oil. Vegetable oil. Beef fat (tallow). Lard. Sweets and desserts: Cookies. Cakes. Pies. Candy. Seasoning and other foods: Mayonnaise. Premade sauces and marinades. The items listed may not be a complete list. Talk with your dietitian about what dietary choices are right for you. Summary The Mediterranean diet includes both food and lifestyle choices. Eat a variety of fresh fruits and vegetables, beans, nuts, seeds, and whole grains. Limit the amount of red meat and sweets that you eat. Talk with your health care provider about whether it is safe for you to drink red wine in moderation. This means 1 glass a day for nonpregnant women and 2 glasses a day for men. A glass of wine equals 5 oz (150 mL). This information is not intended to replace advice given to you by your health care provider. Make sure you discuss any questions you have with your health care provider. Document Released: 01/24/2016 Document Revised: 02/26/2016 Document Reviewed: 01/24/2016 Elsevier Interactive Patient Education  2017 [employer].

## 2023-12-09 NOTE — Assessment & Plan Note (Signed)
 Chronic Blood pressure controlled CBC, CMP Continue amlodipine  5 mg daily, valsartan  320 mg daily

## 2023-12-09 NOTE — Assessment & Plan Note (Signed)
 Chronic Following with neurology-has appointment later today Continue Aricept  10 mg daily, Namenda  5 mg twice daily Recheck TSH, B12

## 2023-12-09 NOTE — Assessment & Plan Note (Signed)
 Chronic Regular exercise and healthy diet encouraged Check lipid panel, CMP, TSH Continue Crestor 40 mg daily

## 2023-12-09 NOTE — Assessment & Plan Note (Signed)
 Chronic Osteoarthritis s/p TKR, s/p scar revision Chronic swelling, decreased range of motion and pain Has seen orthopedics at [facility] for a second opinion, but they did not think there is many for treatment Discussed they could heat, ice, take Tylenol -up to 3000 mg/day

## 2023-12-09 NOTE — Assessment & Plan Note (Signed)
 Chronic Likely related to GERD Improved with omeprazole  20 mg daily-continue

## 2023-12-09 NOTE — Progress Notes (Signed)
 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 RH female with a history of hypertension, arthritis with failed total rib placement, anxiety, depression, hyperlipidemia, DM2, history of CVA 2021 and a diagnosis of moderate early onset dementia due to Alzheimer's disease with behavioral disturbance seen today in follow up for memory loss. Patient is currently on donepezil  10 mg daily and memantine  10 mg twice daily, tolerating well.  She needs some assistance with ADLs than prior.  Mood is controlled by her PCP. SABRA      Follow up in 6  months. Continue donepezil  10 mg daily and memantine  10 mg twice daily, side effects discussed. Continue to replenish B12, follow-up with PCP  Recommend good control of her cardiovascular risk factors. Patient informed of BP readings today Continue to control mood as per PCP     Subjective:    This patient is accompanied in the office by her husband  who supplements the history.  Previous records as well as any outside records available were reviewed prior to todays visit. Patient was last seen on 07/23/23    Any changes in memory since last visit? About the same.  She has some difficulty with short-term memory, especially with new information, conversations and finding the right word which provokes frustration.  LTM is affected as well. The symptoms are not worse than in the prior visit.  repeats oneself?  Endorsed Disoriented when walking into a room? Denies    Leaving objects?  May misplace things but not in unusual places   Wandering behavior?  denies   Any personality changes since last visit?  Denies.   Any worsening depression?:  Denies.   Hallucinations or paranoia?  Denies.   Seizures? denies    Any sleep changes?  Sleeps well, she does have vivid dreams, but denies REM behavior or sleepwalking   Sleep apnea?   Denies.   Any hygiene  concerns? Denies.  Independent of bathing and dressing?  Endorsed  Does the patient needs help with medications?  Husband is in charge   Who is in charge of the finances?  Husband is in charge     Any changes in appetite?  Denies, occasionally she goes out to breakfast with her husband to her favorite place [employer], [place].  Patient have trouble swallowing? Denies.   Does the patient cook? No Any headaches?   denies   Any vision changes? Denies Chronic back pain  denies   Ambulates with difficulty?  She has a history of L>R knee arthritis, uses a cane for stability when needed.  Recent falls or head injuries? Denies.     Unilateral weakness, numbness or tingling? denies   Any tremors?  Denies    Any anosmia?  Denies   Any incontinence of urine?  Endorsed   Any bowel dysfunction?   Denies      Patient lives with her husband  Does the patient drive? No longer drives     History of Present Illness 04/21/2019:  The patient had a telephone visit on 04/21/2019. She was last seen in the neurology clinic 10 months ago for myalgias. EMG/NCV did not show any evidence of myopathy, blood work no evidence of inflammation. She was seen in 2016 for memory changes. MRI brain in 04/2015 unremarkable. She reports that since her last visit, she is noticing more cognitive changes. She has noticed more hesitation during conversations,  she would have to stop and think of what she was going to say. She was laid off in October and offered early retirement, she noticed toward the end that she had to write down everything she was going to say if she had to speak on conference calls. She would go blank when called on to discuss something. She has gotten lost driving, unable to recall how to get to places she has been to for years. She has left the stove on. She infrequently forgets medications. She denies missing bill payments. Sleep and mood are good. She had a knee replacement with continued  pain. She denies any headaches, dizziness, vision changes, focal numbness/tingling/weakness. She is not having as much myalgias as before. She had a repeat MRI brain without contrast done 03/2019 did not show any acute changes. 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      Personally reviewed of the brain 12/03/2021 remarkable for re demonstrated chronic lacunar infarct within the left corona radiata / basal ganglia, stable mild cerebral white matter chronic small vessel ischemic disease, frontal lobe predominant cerebral atrophy.     Neuropsychological evaluation 11/20/2022 briefly, results suggested severe impairment surrounding all aspects of learning and memory. Additional severe impairments were exhibited across executive functioning, safety/judgment, receptive language, verbal fluency, and confrontation naming. Processing speed represented a relative weakness, while performance variability was exhibited across visuospatial abilities. Relative to her previous evaluation in September 2022, notable decline was exhibited across a majority of assessed cognitive domains. Decline was more modest across visuospatial abilities and general stability was exhibited across attention/concentration. However, progressive decline was pronounced across processing speed, executive functioning, receptive language, expressive language, and all aspects of learning and memory. Regarding etiology, current testing with evidence for negative progression over time strengthens previous concerns surrounding the presence of a neurodegenerative illness. 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 [name] did not benefit from repeated exposure to information, was fully amnestic (i.e., 0% retention) after brief delays across all memory tasks, and performed very poorly across yes/no recognition trials. Taken  together, this suggests rapid forgetting and a severe storage impairment, both of which represent hallmark characteristics of this illness. Further impairment surrounding semantic fluency, confrontation naming, and executive functioning follows typical disease progression.          PREVIOUS MEDICATIONS:   CURRENT MEDICATIONS:  Outpatient Encounter Medications as of 12/09/2023  Medication Sig   amLODipine  (NORVASC ) 5 MG tablet TAKE 1 TABLET(5 MG) BY MOUTH DAILY FOR BLOOD PRESSURE   donepezil  (ARICEPT ) 10 MG tablet TAKE 1 TABLET(10 MG) BY MOUTH DAILY   meloxicam  (MOBIC ) 15 MG tablet Take 1 tablet (15 mg total) by mouth daily. Take with food   memantine  (NAMENDA ) 10 MG tablet TAKE 1 TABLET TWICE A DAY   metFORMIN  (GLUCOPHAGE -XR) 750 MG 24 hr tablet Take 1 tablet (750 mg total) by mouth daily with breakfast.   omeprazole  (PRILOSEC) 20 MG capsule Take 1 capsule (20 mg total) by mouth daily.   rosuvastatin  (CRESTOR ) 40 MG tablet Take 1 tablet (40 mg total) by mouth daily. For cholesterol   valsartan  (DIOVAN ) 320 MG tablet TAKE 1 TABLET(320 MG) BY MOUTH DAILY   No facility-administered encounter medications on file as of 12/09/2023.       07/23/2023   12:00 PM 01/20/2023    5:00 PM 07/11/2021    3:00 PM  MMSE - Mini Mental State  Exam  Not completed:  --   Orientation to time 2  4  Orientation to Place 3  5  Registration 3  3  Attention/ Calculation 5  5  Recall 0  1  Language- name 2 objects 1  2  Language- repeat 1  1  Language- follow 3 step command 2  3  Language- read & follow direction 1  1  Write a sentence 0  1  Copy design 0  0  Total score 18  26      06/21/2018    9:00 AM 04/13/2015    1:20 PM  Montreal Cognitive Assessment   Visuospatial/ Executive (0/5) 4 3  Naming (0/3) 3 3  Attention: Read list of digits (0/2) 2 2  Attention: Read list of letters (0/1) 1 1  Attention: Serial 7 subtraction starting at 100 (0/3) 3 3  Language: Repeat phrase (0/2) 2 2  Language :  Fluency (0/1) 0 0  Abstraction (0/2) 2 2  Delayed Recall (0/5) 0 2  Orientation (0/6) 6 6  Total 23 24  Adjusted Score (based on education)  24    Objective:     PHYSICAL EXAMINATION:    VITALS:  There were no vitals filed for this visit.  GEN:  The patient appears stated age and is in NAD. HEENT:  Normocephalic, atraumatic.   Neurological examination:  General: NAD, well-groomed, appears stated age. Orientation: The patient is alert. Oriented to person, not to place and date Cranial nerves: There is good facial symmetry.The speech is less fluent than prior but clear. No aphasia or dysarthria. Fund of knowledge is reduced. Recent and remote memory are impaired. Attention and concentration are reduced. Able to name objects and repeat phrases.  Hearing is intact to conversational tone.  Sensation: Sensation is intact to light touch throughout Motor: Strength is at least antigravity x4. DTR's 2/4 in UE/LE     Movement examination: Tone: There is normal tone in the UE/LE Abnormal movements:  no tremor.  No myoclonus.  No asterixis.   Coordination:  There is no decremation with RAM's. Normal finger to nose  Gait and Station: The patient has some difficulty arising out of a deep-seated chair without the use of the hands. The patient's stride length is good.  Gait is cautious and narrow.    Thank you for allowing us  the opportunity to participate in the care of this nice patient. Please do not hesitate to contact us  for any questions or concerns.   Total time spent on today's visit was 28 minutes dedicated to this patient today, preparing to see patient, examining the patient, ordering tests and/or medications and counseling the patient, documenting clinical information in the EHR or other health record, independently interpreting results and communicating results to the patient/family, discussing treatment and goals, answering patient's questions and coordinating care.  Cc:  Geofm Glade PARAS, MD  Camie Sevin 12/09/2023 6:47 AM

## 2023-12-09 NOTE — Progress Notes (Signed)
 Subjective:    Patient ID: Katherine Lawrence, female    DOB: 1955/07/01, 68 y.o.   MRN: [id number]      HPI Katherine Lawrence is here for  Chief Complaint  Patient presents with   Joint Swelling    Swelling both knees off and on   Arm Pain    Left arm pain noted when reaching   She is almost due for follow-up so we will also follow-up on her chronic medical problems since her husband passed a driver   B/l knee pain -chronic-has arthritis in both.  left knee hurting more - s/p replacement.  Has decreased mobility.  More swollen than usual. Has scar tissue - scar excision tried 08/2019.  Saw ortho at [facility].  They did not think there was a lot that could be done.  She does have difficulty walking because of decreased range of motion.  Right knee pain-chronic arthritis.  Has had injections in the past.   Left arm pain - occasionally goes to reach for something or lift something and will have pain in the upper arm.  No n/t. No hand weakness.  She denies any injuries or new activities that may have caused it.   Medications and allergies reviewed with patient and updated if appropriate.  Current Outpatient Medications on File Prior to Visit  Medication Sig Dispense Refill   amLODipine  (NORVASC ) 5 MG tablet TAKE 1 TABLET(5 MG) BY MOUTH DAILY FOR BLOOD PRESSURE 90 tablet 2   donepezil  (ARICEPT ) 10 MG tablet TAKE 1 TABLET(10 MG) BY MOUTH DAILY 30 tablet 1   memantine  (NAMENDA ) 10 MG tablet TAKE 1 TABLET TWICE A DAY 180 tablet 3   metFORMIN  (GLUCOPHAGE -XR) 750 MG 24 hr tablet Take 1 tablet (750 mg total) by mouth daily with breakfast. 90 tablet 1   omeprazole  (PRILOSEC) 20 MG capsule Take 1 capsule (20 mg total) by mouth daily. 30 capsule 5   rosuvastatin  (CRESTOR ) 40 MG tablet Take 1 tablet (40 mg total) by mouth daily. For cholesterol 90 tablet 3   valsartan  (DIOVAN ) 320 MG tablet TAKE 1 TABLET(320 MG) BY MOUTH DAILY 90 tablet 3   meloxicam  (MOBIC ) 15 MG tablet Take 1 tablet (15 mg total) by  mouth daily. Take with food 14 tablet 0   No current facility-administered medications on file prior to visit.    Review of Systems  Constitutional:  Negative for fever.  Respiratory:  Positive for cough and wheezing (probably). Negative for shortness of breath.   Cardiovascular:  Negative for chest pain, palpitations and leg swelling.  Neurological:  Negative for light-headedness and headaches.       Objective:   Vitals:   12/09/23 1415  BP: 132/72  Pulse: 71  Temp: 98.1 F (36.7 C)  SpO2: 97%   BP Readings from Last 3 Encounters:  12/09/23 132/72  08/10/23 136/84  07/23/23 (!) 161/77   Wt Readings from Last 3 Encounters:  12/09/23 198 lb (89.8 kg)  08/10/23 197 lb (89.4 kg)  07/23/23 206 lb (93.4 kg)   Body mass index is 33.99 kg/m.    Physical Exam Constitutional:      General: She is not in acute distress.    Appearance: Normal appearance.  HENT:     Head: Normocephalic and atraumatic.   Eyes:     Conjunctiva/sclera: Conjunctivae normal.    Cardiovascular:     Rate and Rhythm: Normal rate and regular rhythm.     Heart sounds: Normal heart sounds.  Pulmonary:  Effort: Pulmonary effort is normal. No respiratory distress.     Breath sounds: Normal breath sounds. No wheezing.   Musculoskeletal:        General: Swelling (No left shoulder swelling.  Positive swelling in left knee) and tenderness (Slight tenderness with palpation of the anterior left shoulder) present. No deformity (Left shoulder).     Cervical back: Neck supple.     Right lower leg: No edema.     Left lower leg: No edema.     Comments: Decreased range of motion of left shoulder and pain with lifting in all directions.  Lymphadenopathy:     Cervical: No cervical adenopathy.   Skin:    General: Skin is warm and dry.     Findings: No rash.   Neurological:     Mental Status: She is alert. Mental status is at baseline.     Sensory: No sensory deficit.     Motor: No weakness.    Psychiatric:        Mood and Affect: Mood normal.        Behavior: Behavior normal.        Diabetic Foot Exam - Simple   Simple Foot Form Diabetic Foot exam was performed with the following findings: Yes 12/09/2023  2:43 PM  Visual Inspection No deformities, no ulcerations, no other skin breakdown bilaterally: Yes See comments: Yes Sensation Testing Intact to touch and monofilament testing bilaterally: Yes Pulse Check Posterior Tibialis and Dorsalis pulse intact bilaterally: Yes Comments Thickened dark toenails        Assessment & Plan:    See Problem List for Assessment and Plan of chronic medical problems.

## 2023-12-09 NOTE — Assessment & Plan Note (Signed)
Chronic ?Check B12 level ?

## 2023-12-09 NOTE — Assessment & Plan Note (Signed)
 Chronic Has had injections in the past, but has not had one while Wants to avoid surgery Discussed that we could have her see sports medicine or orthopedics to get injections again Can ice, heat Increase Tylenol  up to 1000 mg daily

## 2023-12-16 ENCOUNTER — Ambulatory Visit (INDEPENDENT_AMBULATORY_CARE_PROVIDER_SITE_OTHER)

## 2023-12-16 ENCOUNTER — Ambulatory Visit (INDEPENDENT_AMBULATORY_CARE_PROVIDER_SITE_OTHER): Admitting: Podiatry

## 2023-12-16 ENCOUNTER — Telehealth: Payer: Self-pay

## 2023-12-16 ENCOUNTER — Ambulatory Visit: Payer: Self-pay

## 2023-12-16 ENCOUNTER — Ambulatory Visit: Admitting: Family Medicine

## 2023-12-16 VITALS — BP 142/84 | HR 72 | Ht 64.0 in | Wt 194.0 lb

## 2023-12-16 DIAGNOSIS — M1711 Unilateral primary osteoarthritis, right knee: Secondary | ICD-10-CM

## 2023-12-16 DIAGNOSIS — M25562 Pain in left knee: Secondary | ICD-10-CM

## 2023-12-16 DIAGNOSIS — B351 Tinea unguium: Secondary | ICD-10-CM

## 2023-12-16 DIAGNOSIS — M79674 Pain in right toe(s): Secondary | ICD-10-CM

## 2023-12-16 DIAGNOSIS — G8929 Other chronic pain: Secondary | ICD-10-CM | POA: Diagnosis not present

## 2023-12-16 DIAGNOSIS — M79675 Pain in left toe(s): Secondary | ICD-10-CM

## 2023-12-16 DIAGNOSIS — M25561 Pain in right knee: Secondary | ICD-10-CM | POA: Diagnosis not present

## 2023-12-16 DIAGNOSIS — Z96652 Presence of left artificial knee joint: Secondary | ICD-10-CM | POA: Diagnosis not present

## 2023-12-16 NOTE — Telephone Encounter (Signed)
 Benefits ran case ID [id number]

## 2023-12-16 NOTE — Patient Instructions (Addendum)
 Thank you for coming in today.   You received an injection today. Seek immediate medical attention if the joint becomes red, extremely painful, or is oozing fluid.   Please get an Xray today before you leave   We will work to authorize Zilretta for the right knee. You will hear from my office about scheduling once we get it approved by your insurance  I've referred you to Interventional Radiology for a consultation on the Genicular Artery Embolization procedure.  Let us  know if you don't hear from them in one week.

## 2023-12-16 NOTE — Progress Notes (Unsigned)
 LILLETTE Ileana Collet, PhD, LAT, ATC acting as a scribe for Artist Lloyd, MD.  Katherine Lawrence is a 68 y.o. female who presents to [facility] at [facility] today for bilat knee pain, L>R, ongoing intermittently, worsening over the last 5-6 months. She had a L TRK IN 2015 and a later revision in 2020. Pt locates pain to anteriorly.  She was recently had a steroid injection in her right knee at [facility] on October 06, 2023.  She thinks this helped but did not last.  Knee swelling: L knee is very swollen Mechanical symptoms: yes Aggravates: yes Treatments tried: ice, Tylenol ,   Dx testing: 06/30/23 R knee XR  Pertinent review of systems: No fevers or chills  Relevant historical information: Diabetes.  Left knee replacement with revision in 2020. Dementia thought to be due to Alzheimer's disease.  Patient is treated with Aricept .  She is accompanied today by her husband.   Exam:  BP (!) 142/84   Pulse 72   Ht 5' 4 (1.626 m)   Wt 194 lb (88 kg)   LMP 07/07/2011   SpO2 96%   BMI 33.30 kg/m  General: Well Developed, well nourished, and in no acute distress.   MSK: Left knee mature scar anterior knee.  Mild effusion.  Normal motion.  Right knee mild effusion normal motion.    Lab and Radiology Results  X-ray images left knee obtained today personally and independently interpreted. Longstem left total knee replacement.  No acute fractures.  Await formal radiology review   X-RAY KNEE RIGHT (3 VIEWS), 09/04/2023 11:35 AM  INDICATION: Pain in right knee \ M25.561 Pain in right knee COMPARISON: None.  IMPRESSION: 1.  No acute fracture. 2.  No malalignment. 3.  Large joint effusion. 4.  Tricompartment degenerative joint narrowing, particularly of the medial compartment. 5.  There are vascular calcifications. Exam End: 09/04/23 11:35   Specimen Collected: 09/04/23 11:48     Assessment and Plan: 68 y.o. female with chronic bilateral knee  pain.  Right knee pain due to DJD.  She has had several x-rays over the last several months that show significant right knee osteoarthritis.  This is the source of her pain.  She is already had steroid injections which have not provided lasting benefit.  Will go ahead and work on authorization for Zilretta or gel injections with anticipation of doing those in the near future.  Chronic left knee pain following a total knee replacement.  She is not a good candidate for physical therapy due to her mild cognitive impairment/dementia.  I do not think even with hardware loosening is a possibility she is a good candidate for second revision/third knee replacement.  Will go ahead and refer to interventional radiology for evaluation of the genicular artery embolization.  This may provide some benefit for her chronic left knee pain.   PDMP not reviewed this encounter. Orders Placed This Encounter  Procedures   DG Knee AP/LAT W/[place] Left    Standing Status:   Future    Number of Occurrences:   1    Expiration Date:   01/16/2024    Reason for Exam (SYMPTOM  OR DIAGNOSIS REQUIRED):   bilateral knee pain    Preferred imaging location?:   [facility] [place] [facility]   US  LIMITED JOINT SPACE STRUCTURES LOW BILAT(NO LINKED CHARGES)    Reason for Exam (SYMPTOM  OR DIAGNOSIS REQUIRED):   bilateral knee pain    Preferred imaging location?:   [facility] Radiologist Eval [facility]    Standing Status:   Future    Expiration Date:   12/15/2024    Scheduling Instructions:     Genicular Artery Embolization, L knee, TKR 2015 and revision in 2020    Reason for Exam (SYMPTOM  OR DIAGNOSIS REQUIRED):   Left knee pain    Preferred imaging location?:   [facility]   No orders of the defined types were placed in this encounter.    Discussed warning signs or symptoms. Please see discharge instructions. Patient expresses understanding.   The above documentation has been reviewed and is  accurate and complete Artist Lloyd, M.D. ***Removal ultrasound

## 2023-12-16 NOTE — Progress Notes (Signed)
  Subjective:  Patient ID: Katherine Lawrence, female    DOB: [birth year],  MRN: [id number]  Chief Complaint  Patient presents with   Diabetes    Nail trim   68 y.o. female returns for the above complaint.  Patient presents with thickened elongated dystrophic mycotic toenails x 10 mild pain on palpation arch with ambulation is with pressure patient would like to have it debrided she is not able to do it herself denies any other acute complaints pain scale 7 out of 10 dull aching nature  Objective:  There were no vitals filed for this visit. Podiatric Exam: Vascular: dorsalis pedis and posterior tibial pulses are palpable bilateral. Capillary return is immediate. Temperature gradient is WNL. Skin turgor WNL  Sensorium: Normal Semmes Weinstein monofilament test. Normal tactile sensation bilaterally. Nail Exam: Pt has thick disfigured discolored nails with subungual debris noted bilateral entire nail hallux through fifth toenails.  Pain on palpation to the nails. Ulcer Exam: There is no evidence of ulcer or pre-ulcerative changes or infection. Orthopedic Exam: Muscle tone and strength are WNL. No limitations in general ROM. No crepitus or effusions noted.  Skin: No Porokeratosis. No infection or ulcers    Assessment & Plan:  No diagnosis found.  Patient was evaluated and treated and all questions answered.  Onychomycosis with pain  -Nails palliatively debrided as below. -Educated on self-care  Procedure: Nail Debridement Rationale: pain  Type of Debridement: manual, sharp debridement. Instrumentation: Nail nipper, rotary burr. Number of Nails: 10  Procedures and Treatment: Consent by patient was obtained for treatment procedures. The patient understood the discussion of treatment and procedures well. All questions were answered thoroughly reviewed. Debridement of mycotic and hypertrophic toenails, 1 through 5 bilateral and clearing of subungual debris. No ulceration, no infection noted.   Return Visit-Office Procedure: Patient instructed to return to the office for a follow up visit 3 months for continued evaluation and treatment.  [name], DPM    Return in about 3 months (around 03/17/2024) for [facility].

## 2023-12-16 NOTE — Telephone Encounter (Signed)
 Please auth Zilretta, RIGHT knee

## 2023-12-17 ENCOUNTER — Ambulatory Visit: Payer: Self-pay | Admitting: Family Medicine

## 2023-12-17 NOTE — Progress Notes (Signed)
 Left knee x-ray shows a good-looking knee replacement with no evidence of hardware failure.

## 2023-12-21 ENCOUNTER — Other Ambulatory Visit: Payer: Self-pay | Admitting: Internal Medicine

## 2023-12-24 NOTE — Progress Notes (Signed)
 Chief Complaint: Patient was seen in consultation today for left knee pain.   Referring Physician(s): Corey,Evan S  History of Present Illness: Katherine Lawrence is a 68 y.o. female with a medical history significant for HTN, anxiety/depression, CVA, obesity, DM2, dementia and osteoarthritis of both knees. She has chronic pain in both knees with the left knee hurting worse than the right. She had a left total knee replacement in 2015 with a revision in 2020. She is followed by Dr. Joane and was recently evaluated 12/16/23. She had a steroid injection into the right knee in April and this helped somewhat but didn't last long. Dr. Joane is going to have her try the Zilretta or gel injections. Physical exam showed her left knee to be swollen and the patient was treating her knee pain with ice and tylenol . She is not a good candidate for physical therapy due to her mild cognitive impairment and she is also not a good candidate for another revision.   She has kindly been referred to Interventional Radiology to discuss geniculate artery embolization as a possible treatment option.   [place] Pain Score = 68/96 VAS Pain Score = 8/10  Her left knee bothers her worse than her right.  It has hurt since the day after her athroplasty revision in 2020.  The pain has progressively gotten worse.  Ambulation if very difficult and she relies on a cane.  She also has pain in the right knee.  She has never had surgery on the right knee. Her pain and immobility causes her depression and is debilitating.  She has several risk factors for atherosclerotic disease including hypercholesterolemia, hypertension, pre-diabetes, and remote smoking history.  She quit smoking 40 years ago, takes aspirin  daily, and is on a statin.  She denies any claudication or poor healing of lower extremity wounds.     Past Medical History:  Diagnosis Date   Allergy    CIN I (cervical intraepithelial neoplasia I) 1996   cryo...  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   Obesity 07/30/2011   Osteoarthritis of left hip 05/10/2017   Osteoarthritis of left knee    Pending L TKR fall 2015 - [facility]   Osteoarthritis of right knee 02/16/2022   Palpitations 08/05/2019   Pneumonia 06/2003   Postsurgical menopause    Pure hypercholesterolemia 04/03/2008   S/P total knee replacement using cement 02/14/2014   Snoring 12/07/2019   Thumb pain 06/01/2019   Type 2 diabetes mellitus without complication, without long-term current use of insulin  04/13/2015   Dr octavia   Urinary frequency 11/18/2021   Visual disturbance 11/18/2021    Past Surgical History:  Procedure Laterality Date   ABDOMINAL HYSTERECTOMY  08/22/2011   CERVICAL CONE BIOPSY  1996   CESAREAN  SECTION     x1   COLONOSCOPY W/ POLYPECTOMY  12/2008   x3   DILATION AND CURETTAGE OF UTERUS     KNEE ARTHROTOMY Left 08/15/2019   Procedure: Left knee arthrotomy; scar excision;  Surgeon: Melodi Lerner, MD;  Location: [facility];  Service: Orthopedics;  Laterality: Left;    TONSILLECTOMY     TOTAL HIP ARTHROPLASTY Right 2008   TOTAL HIP ARTHROPLASTY Left 05/20/2017   Procedure: LEFT TOTAL HIP ARTHROPLASTY ANTERIOR APPROACH;  Surgeon: Melodi Lerner, MD;  Location: [facility];  Service: Orthopedics;   Laterality: Left;   TOTAL KNEE ARTHROPLASTY Left 02/14/2014   DR ANDERSON   TOTAL KNEE ARTHROPLASTY Left 02/14/2014   Procedure: TOTAL KNEE ARTHROPLASTY;  Surgeon: Maude LELON ANDERSON, MD;  [facility];  Service: Orthopedics;  Laterality: Left;   TOTAL KNEE REVISION Left 07/14/2018   Procedure: TOTAL KNEE REVISION;  Surgeon: Melodi Lerner, MD;  Location: [facility];  Service: Orthopedics;  Laterality: Left;  Adductor Block    Allergies: Pollen extract  Medications: Prior to Admission medications   Medication Sig Start Date End Date Taking? Authorizing Provider  amLODipine  (NORVASC ) 5 MG tablet TAKE 1 TABLET(5 MG) BY MOUTH DAILY FOR BLOOD PRESSURE 04/21/23   Geofm Glade PARAS, MD  donepezil  (ARICEPT ) 10 MG tablet TAKE 1 TABLET(10 MG) BY MOUTH DAILY 10/06/23   Wertman, Sara E, PA-C  memantine  (NAMENDA ) 10 MG tablet TAKE 1 TABLET TWICE A DAY 10/08/23   Dina, Sara E, PA-C  metFORMIN  (GLUCOPHAGE -XR) 750 MG 24 hr tablet Take 1 tablet (750 mg total) by mouth daily with breakfast. 12/02/23   Burns, Glade PARAS, MD  omeprazole  (PRILOSEC) 20 MG capsule TAKE 1 CAPSULE(20 MG) BY MOUTH DAILY 12/21/23   Geofm Glade PARAS, MD  rosuvastatin  (CRESTOR ) 40 MG tablet Take 1 tablet (40 mg total) by mouth daily. For cholesterol 07/07/23   Geofm Glade PARAS, MD  valsartan  (DIOVAN ) 320 MG tablet TAKE 1 TABLET(320 MG) BY MOUTH DAILY 09/03/23   Geofm Glade PARAS, MD     Family History  Problem Relation Age of Onset   Pancreatic cancer Mother 58   Hypertension Father    Heart disease Father    Diabetes Son    Colon cancer Neg Hx    Esophageal cancer Neg Hx    Stomach cancer Neg Hx    Rectal cancer Neg Hx     Social History   Socioeconomic History   Marital status: Married    Spouse name: Not on file   Number of children: 2   Years of education: 14   Highest education level: Some college, no degree  Occupational History   Occupation: Retired    Comment: Advertising copywriter - Warden/ranger  Tobacco Use   Smoking status:  Former    Current packs/day: 0.00    Average packs/day: 0.3 packs/day for 1 year (0.3 ttl pk-yrs)    Types: Cigarettes    Start date: 07/08/2009    Quit date: 07/08/2010    Years since quitting: 13.4   Smokeless tobacco: Never  Vaping Use   Vaping status: Never Used  Substance and Sexual Activity   Alcohol use: Not Currently   Drug use: No   Sexual activity: Not Currently    Comment: 1st intercourse- 14, partners- 10, married- 35 yrs   Other Topics Concern   Not on file  Social History Narrative   Lives with spouse; grown kids   Pt is also a Consulting civil engineer.   Regular exercise-yes  Right Handed    Lives in a one story home    Social Drivers of Health   Financial Resource Strain: Low Risk  (12/09/2023)   Overall Financial Resource Strain (CARDIA)    Difficulty of Paying Living Expenses: Not hard at all  Food Insecurity: No Food Insecurity (12/09/2023)   Hunger Vital Sign    Worried About Running Out of Food in the Last Year: Never true    Ran Out of Food in the Last Year: Never true  Transportation Needs: No Transportation Needs (12/09/2023)   PRAPARE - Administrator, Civil Service (Medical): No    Lack of Transportation (Non-Medical): No  Physical Activity: Inactive (12/09/2023)   Exercise Vital Sign    Days of Exercise per Week: 0 days    Minutes of Exercise per Session: Not on file  Stress: No Stress Concern Present (12/09/2023)   [employer] of Occupational Health - Occupational Stress Questionnaire    Feeling of Stress: Not at all  Social Connections: Moderately Isolated (12/09/2023)   Social Connection and Isolation Panel    Frequency of Communication with Friends and Family: More than three times a week    Frequency of Social Gatherings with Friends and Family: Once a week    Attends Religious Services: Never    Database administrator or Organizations: No    Attends Engineer, structural: Not on file    Marital Status: Married     Review of  Systems: A 12 point ROS discussed and pertinent positives are indicated in the HPI above.  All other systems are negative.  Vital Signs: LMP 07/07/2011   Advance Care Plan: The advanced care plan/surrogate decision maker was discussed at the time of visit and documented in the medical record.    Physical Exam Constitutional:      General: She is not in acute distress. HENT:     Head: Normocephalic.     Mouth/Throat:     Mouth: Mucous membranes are moist.  Eyes:     General: No scleral icterus. Cardiovascular:     Rate and Rhythm: Normal rate and regular rhythm.  Pulmonary:     Effort: No respiratory distress.  Abdominal:     General: There is no distension.  Musculoskeletal:     Right lower leg: No edema.     Left lower leg: No edema.       Legs:     Comments: Tender to palpation  Skin:    General: Skin is warm and dry.     Findings: No erythema.  Neurological:     Mental Status: She is alert and oriented to person, place, and time.     Imaging:  Left knee 12/16/23    Right knee 06/30/23  Kellgren and Lawrence grade II    Labs:  CBC: Recent Labs    05/05/23 1459 12/09/23 1449  WBC 9.6 9.1  HGB 15.0 12.2  HCT 45.8 36.8  PLT 274.0 264.0    COAGS: No results for input(s): INR, APTT in the last 8760 hours.  BMP: Recent Labs    05/05/23 1459 07/01/23 1020 12/09/23 1449  NA 143 144 142  K 3.9 4.5 3.7  CL 103 102 103  CO2 32 31 31  GLUCOSE 104* 122* 93  BUN 12 23 18   CALCIUM  10.1 10.0 9.5  CREATININE 0.60 0.88 0.90    LIVER FUNCTION TESTS: Recent Labs    05/05/23 1459 07/01/23 1020 12/09/23 1449  BILITOT 0.6  0.7 0.4  AST 13 13 15   ALT 11 10 10   ALKPHOS 52 51 50  PROT 7.4 7.0 7.1  ALBUMIN  4.8 4.6 4.4    TUMOR MARKERS: No results for input(s): AFPTM, CEA, CA199, CHROMGRNA in the last 8760 hours.  Assessment and Plan: 69 year old female with a history of bilateral knee osteoarthritis with chronic knee pain left > right.  She is status left total knee replacement in 2015 with a revision performed in 2020. She continues to have significant left knee pain after arthroplasty revision, which bothers her more than her concomitant right knee pain secondary to advance osteoarthritis.  We discussed the rationale, periprocedural expectations including risks, and long term expected outcomes both for post-arthroplasty and primary osteoarthritis geniculate artery embolization.  She is interested and would like to proceed.  Given atherosclerosis on radiographs, I believe it is prudent to assess for underlying peripheral artery disease despite being asymptomatic.    -obtain bilateral ABIs -if ABIs are normal, we can then proceed with left geniculate artery embolization with staged right geniculate artery embolization approximately 2-3 weeks later - plan for antegrade femoral approach for both -if ABIs are abnormal, we will schedule another clinic visit to discuss results and next steps    Ester Sides, MD Pager: [phone]    I spent a total of  40 Minutes   in face to face in clinical consultation, greater than 50% of which was counseling/coordinating care for left knee pain.

## 2023-12-25 ENCOUNTER — Other Ambulatory Visit: Payer: Self-pay | Admitting: Interventional Radiology

## 2023-12-25 ENCOUNTER — Ambulatory Visit
Admission: RE | Admit: 2023-12-25 | Discharge: 2023-12-25 | Disposition: A | Discharge status: Home / Self Care | Source: Ambulatory Visit | Attending: Family Medicine | Admitting: Family Medicine

## 2023-12-25 DIAGNOSIS — I739 Peripheral vascular disease, unspecified: Secondary | ICD-10-CM

## 2023-12-25 DIAGNOSIS — G8929 Other chronic pain: Secondary | ICD-10-CM

## 2023-12-25 DIAGNOSIS — M25562 Pain in left knee: Secondary | ICD-10-CM | POA: Diagnosis not present

## 2023-12-25 DIAGNOSIS — Z96652 Presence of left artificial knee joint: Secondary | ICD-10-CM | POA: Diagnosis not present

## 2023-12-25 DIAGNOSIS — M25561 Pain in right knee: Secondary | ICD-10-CM

## 2023-12-25 HISTORY — PX: IR RADIOLOGIST EVAL & MGMT: IMG5224

## 2023-12-29 ENCOUNTER — Ambulatory Visit
Admission: RE | Admit: 2023-12-29 | Discharge: 2023-12-29 | Disposition: A | Discharge status: Home / Self Care | Source: Ambulatory Visit | Attending: Interventional Radiology | Admitting: Interventional Radiology

## 2023-12-29 DIAGNOSIS — M25562 Pain in left knee: Secondary | ICD-10-CM

## 2023-12-29 DIAGNOSIS — I739 Peripheral vascular disease, unspecified: Secondary | ICD-10-CM

## 2023-12-30 ENCOUNTER — Encounter: Payer: BC Managed Care – PPO | Admitting: Internal Medicine

## 2023-12-31 ENCOUNTER — Other Ambulatory Visit: Payer: Self-pay | Admitting: Interventional Radiology

## 2023-12-31 DIAGNOSIS — I771 Stricture of artery: Secondary | ICD-10-CM

## 2023-12-31 DIAGNOSIS — I739 Peripheral vascular disease, unspecified: Secondary | ICD-10-CM

## 2024-01-01 ENCOUNTER — Inpatient Hospital Stay: Admission: RE | Admit: 2024-01-01 | Source: Ambulatory Visit

## 2024-01-05 NOTE — Telephone Encounter (Signed)
 [employer] anthem does not approve Zilretta or gel as it is considered experimental.

## 2024-01-06 ENCOUNTER — Ambulatory Visit
Admission: RE | Admit: 2024-01-06 | Discharge: 2024-01-06 | Disposition: A | Discharge status: Home / Self Care | Source: Ambulatory Visit | Attending: Interventional Radiology | Admitting: Interventional Radiology

## 2024-01-06 DIAGNOSIS — I70203 Unspecified atherosclerosis of native arteries of extremities, bilateral legs: Secondary | ICD-10-CM | POA: Diagnosis not present

## 2024-01-06 DIAGNOSIS — K8020 Calculus of gallbladder without cholecystitis without obstruction: Secondary | ICD-10-CM | POA: Diagnosis not present

## 2024-01-06 DIAGNOSIS — R911 Solitary pulmonary nodule: Secondary | ICD-10-CM | POA: Diagnosis not present

## 2024-01-06 DIAGNOSIS — I771 Stricture of artery: Secondary | ICD-10-CM

## 2024-01-06 DIAGNOSIS — I739 Peripheral vascular disease, unspecified: Secondary | ICD-10-CM

## 2024-01-06 DIAGNOSIS — I708 Atherosclerosis of other arteries: Secondary | ICD-10-CM | POA: Diagnosis not present

## 2024-01-06 MED ORDER — IOPAMIDOL (ISOVUE-370) INJECTION 76%
125.0000 mL | Freq: Once | INTRAVENOUS | Status: AC | PRN
  Administered 2024-01-06: 125 mL via INTRAVENOUS

## 2024-01-06 NOTE — Telephone Encounter (Signed)
 Patient ran for Synvisc via fax on 01/06/24. Pending approval.

## 2024-01-06 NOTE — Telephone Encounter (Signed)
 Can we check coverage of gel shots please?   Per visit note 12/16/23:  Assessment and Plan: 68 y.o. female with chronic bilateral knee pain.   Right knee pain due to DJD.  She has had several x-rays over the last several months that show significant right knee osteoarthritis.  This is the source of her pain.  She is already had steroid injections which have not provided lasting benefit.  Will go ahead and work on authorization for Zilretta or gel injections with anticipation of doing those in the near future.   Chronic left knee pain following a total knee replacement.  She is not a good candidate for physical therapy due to her mild cognitive impairment/dementia.  I do not think even with hardware loosening is a possibility she is a good candidate for second revision/third knee replacement.  Will go ahead and refer to interventional radiology for evaluation of the genicular artery embolization.  This may provide some benefit for her chronic left knee pain.

## 2024-01-07 NOTE — Telephone Encounter (Signed)
 Dr. Joane, insurance does not cover visco or Zilretta. Please advise regarding next steps.

## 2024-01-07 NOTE — Telephone Encounter (Signed)
 Patient has [employer] as I stated below her insurance does not cover HYALURONIC ACID INJECTIONS or Zilretta.  This is not a [place] so cannot be ran for [employer] on [employer] forms.

## 2024-01-08 NOTE — Telephone Encounter (Signed)
 Think we need to proceed to genicular artery embolization

## 2024-01-08 NOTE — Progress Notes (Signed)
 This encounter was conducted via the [employer] providing interactive audio and visual communication.  The patient provided verbal consent to conduct a virtual appointment.  The patient was located at their primary residence during this encounter.  Referring Physician(s): Corey,Evan S   Chief Complaint: The patient is seen in virtual video consultation today for left knee pain.   History of present illness: HPI from initial consultation 12/25/23 Katherine Lawrence is a 68 y.o. female with a medical history significant for HTN, anxiety/depression, CVA, obesity, DM2, dementia and osteoarthritis of both knees. She has chronic pain in both knees with the left knee hurting worse than the right. She had a left total knee replacement in 2015 with a revision in 2020. She is followed by Dr. Joane and was recently evaluated 12/16/23. She had a steroid injection into the right knee in April and this helped somewhat but didn't last long. Dr. Joane is going to have her try the Zilretta or gel injections. Physical exam showed her left knee to be swollen and the patient was treating her knee pain with ice and tylenol . She is not a good candidate for physical therapy due to her mild cognitive impairment and she is also not a good candidate for another revision.    She has kindly been referred to Interventional Radiology to discuss geniculate artery embolization as a possible treatment option.    [place] Pain Score = 68/96 VAS Pain Score = 8/10   Her left knee bothers her worse than her right. It has hurt since the day after her arthroplasty revision in 2020. The pain has progressively gotten worse. Ambulation is very difficult and she relies on a cane. She also has pain in the right knee. She has never had surgery on the right knee. Her pain and immobility cause her depression and is debilitating. She has several risk factors for atherosclerotic disease including hypercholesterolemia, hypertension,  pre-diabetes, and remote smoking history.  She quit smoking 40 years ago, takes aspirin  daily, and is on a statin. She denies any claudication or poor healing of lower extremity wounds.    We discussed the rationale, periprocedural expectations including risks, and long term expected outcomes both for post-arthroplasty and primary osteoarthritis geniculate artery embolization. She was interested in proceeding. Her imaging showed atherosclerosis on radiographs, and I believed it was prudent to assess for underlying peripheral artery disease despite the patient being asymptomatic. She had several imaging studies performed after our consultation - Bilateral ABIs and a CTA of the abdominal aorta with ileofemoral run off.   She presents today via virtual video visit to discuss these results and possible next steps. She denies any calf or thigh cramping with ambulation.  She denies any history of poorly healing or spontaneous lower extremity wounds.   Past Medical History:  Diagnosis Date   Allergy    CIN I (cervical intraepithelial neoplasia I) 1996   cryo...  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   Obesity 07/30/2011   Osteoarthritis of left hip 05/10/2017   Osteoarthritis of left knee    Pending L TKR fall 2015 -  [facility]   Osteoarthritis of right knee 02/16/2022   Palpitations 08/05/2019   Pneumonia 06/2003   Postsurgical menopause    Pure hypercholesterolemia 04/03/2008   S/P total knee replacement using cement 02/14/2014   Snoring 12/07/2019   Thumb pain 06/01/2019   Type 2 diabetes mellitus without complication, without long-term current use of insulin  04/13/2015   Dr octavia   Urinary frequency 11/18/2021   Visual disturbance 11/18/2021    Past Surgical History:  Procedure Laterality Date   ABDOMINAL HYSTERECTOMY  08/22/2011   CERVICAL CONE BIOPSY  1996   CESAREAN SECTION     x1   COLONOSCOPY W/ POLYPECTOMY  12/2008   x3   DILATION AND CURETTAGE OF UTERUS     IR RADIOLOGIST EVAL & MGMT  12/25/2023   KNEE ARTHROTOMY Left 08/15/2019   Procedure: Left knee arthrotomy; scar excision;  Surgeon: Melodi Lerner, MD;  Location: [facility];  Service: Orthopedics;  Laterality: Left;    TONSILLECTOMY     TOTAL HIP ARTHROPLASTY Right 2008   TOTAL HIP ARTHROPLASTY Left 05/20/2017   Procedure: LEFT TOTAL HIP ARTHROPLASTY ANTERIOR APPROACH;  Surgeon: Melodi Lerner, MD;  Location: [facility];  Service: Orthopedics;  Laterality: Left;   TOTAL KNEE ARTHROPLASTY Left 02/14/2014   DR ANDERSON   TOTAL KNEE ARTHROPLASTY Left 02/14/2014   Procedure: TOTAL KNEE ARTHROPLASTY;  Surgeon: Maude LELON ANDERSON, MD;  Location: [facility];  Service: Orthopedics;  Laterality: Left;   TOTAL KNEE REVISION Left 07/14/2018   Procedure: TOTAL KNEE REVISION;  Surgeon: Melodi Lerner, MD;  Location: [facility];  Service: Orthopedics;  Laterality: Left;  Adductor Block    Allergies: Patient has no known allergies.  Medications: Prior to Admission medications   Medication Sig Start Date End Date Taking? Authorizing Provider  amLODipine  (NORVASC ) 5 MG tablet TAKE 1 TABLET(5 MG) BY MOUTH DAILY FOR BLOOD PRESSURE 04/21/23   Geofm Glade PARAS, MD  donepezil  (ARICEPT ) 10 MG tablet TAKE 1 TABLET(10 MG) BY MOUTH DAILY 10/06/23   Wertman, Sara E,  PA-C  memantine  (NAMENDA ) 10 MG tablet TAKE 1 TABLET TWICE A DAY 10/08/23   Wertman, Sara E, PA-C  metFORMIN  (GLUCOPHAGE -XR) 750 MG 24 hr tablet Take 1 tablet (750 mg total) by mouth daily with breakfast. 12/02/23   Burns, Glade PARAS, MD  omeprazole  (PRILOSEC) 20 MG capsule TAKE 1 CAPSULE(20 MG) BY MOUTH DAILY 12/21/23   Geofm Glade PARAS, MD  rosuvastatin  (CRESTOR ) 40 MG tablet Take 1 tablet (40 mg total) by mouth daily. For cholesterol 07/07/23   Geofm Glade PARAS, MD  valsartan  (DIOVAN ) 320 MG tablet TAKE 1 TABLET(320 MG) BY MOUTH DAILY 09/03/23   Geofm Glade PARAS, MD     Family History  Problem Relation Age of Onset   Pancreatic cancer Mother 13   Hypertension Father    Heart disease Father    Diabetes Son    Colon cancer Neg Hx    Esophageal cancer Neg Hx    Stomach cancer Neg Hx    Rectal cancer Neg Hx     Social History   Socioeconomic History   Marital status: Married    Spouse name:  Not on file   Number of children: 2   Years of education: 14   Highest education level: Some college, no degree  Occupational History   Occupation: Retired    Comment: Advertising copywriter - Warden/ranger  Tobacco Use   Smoking status: Former    Current packs/day: 0.00    Average packs/day: 0.3 packs/day for 1 year (0.3 ttl pk-yrs)    Types: Cigarettes    Start date: 07/08/2009    Quit date: 07/08/2010    Years since quitting: 13.5   Smokeless tobacco: Never  Vaping Use   Vaping status: Never Used  Substance and Sexual Activity   Alcohol use: Not Currently   Drug use: No   Sexual activity: Not Currently    Comment: 1st intercourse- 14, partners- 10, married- 35 yrs   Other Topics Concern   Not on file  Social History Narrative   Lives with spouse; grown kids   Pt is also a Consulting civil engineer.   Regular exercise-yes      Right Handed    Lives in a one story home    Social Drivers of Health   Financial Resource Strain: Low Risk  (12/09/2023)   Overall Financial Resource Strain (CARDIA)    Difficulty of  Paying Living Expenses: Not hard at all  Food Insecurity: No Food Insecurity (12/09/2023)   Hunger Vital Sign    Worried About Running Out of Food in the Last Year: Never true    Ran Out of Food in the Last Year: Never true  Transportation Needs: No Transportation Needs (12/09/2023)   PRAPARE - Administrator, Civil Service (Medical): No    Lack of Transportation (Non-Medical): No  Physical Activity: Inactive (12/09/2023)   Exercise Vital Sign    Days of Exercise per Week: 0 days    Minutes of Exercise per Session: Not on file  Stress: No Stress Concern Present (12/09/2023)   [employer] of Occupational Health - Occupational Stress Questionnaire    Feeling of Stress: Not at all  Social Connections: Moderately Isolated (12/09/2023)   Social Connection and Isolation Panel    Frequency of Communication with Friends and Family: More than three times a week    Frequency of Social Gatherings with Friends and Family: Once a week    Attends Religious Services: Never    Database administrator or Organizations: No    Attends Banker Meetings: Not on file    Marital Status: Married     Vital Signs: LMP 07/07/2011   Physical Exam  Patient is alert, oriented and able to participate fully in the conversation. No apparent discomfort or distress observed. She appears appropriately dressed.    Imaging:  Left knee 12/16/23      Right knee 06/30/23  Kellgren and Lawrence grade II  ABIs (12/29/23)   CTA Runoff 01/06/24 IMPRESSION: VASCULAR  1. Bilateral femoropopliteal and likely infrageniculate peripheral arterial disease more severe on the right the left as detailed above. 2. 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 3. Aortic Atherosclerosis (ICD10-I70.0).    Labs:  CBC: Recent Labs    05/05/23 1459 12/09/23 1449  WBC 9.6 9.1  HGB 15.0 12.2  HCT 45.8 36.8  PLT 274.0 264.0    COAGS: No results for  input(s): INR, APTT in the last 8760 hours.  BMP: Recent Labs    05/05/23 1459 07/01/23 1020 12/09/23 1449  NA 143 144 142  K 3.9 4.5 3.7  CL 103 102 103  CO2 32 31 31  GLUCOSE 104* 122* 93  BUN 12 23 18   CALCIUM  10.1 10.0 9.5  CREATININE 0.60 0.88 0.90    LIVER FUNCTION TESTS: Recent Labs    05/05/23 1459 07/01/23 1020 12/09/23 1449  BILITOT 0.6 0.7 0.4  AST 13 13 15   ALT 11 10 10   ALKPHOS 52 51 50  PROT 7.4 7.0 7.1  ALBUMIN  4.8 4.6 4.4    Assessment and Plan:  68 year old female with a history of bilateral knee osteoarthritis with chronic knee pain left > right. She is status left total knee replacement in 2015 with a revision performed in 2020. She continues to have significant left knee pain after arthroplasty revision, which bothers her more than her concomitant right knee pain secondary to advance osteoarthritis.  After further imaging, she is unfortunately not a candidate for geniculate artery embolization secondary to her advanced bilateral peripheral artery disease.  We discussed this and the rationale behind preserving the geniculate arteries for possible future collateral pathways if needed.  She and her husband are understanding.  She will reach out to us  in the future if she becomes symptomatic (claudication, rest pain, wounds) and wishes to pursue further treatment for PAD.   Ester Sides, MD Pager: [phone]    I spent a total of 25 Minutes in virtual video clinical consultation, greater than 50% of which was counseling/coordinating care for left knee pain and peripheral artery disease.

## 2024-01-11 ENCOUNTER — Ambulatory Visit
Admission: RE | Admit: 2024-01-11 | Discharge: 2024-01-11 | Disposition: A | Discharge status: Home / Self Care | Source: Ambulatory Visit | Attending: Interventional Radiology

## 2024-01-11 DIAGNOSIS — E119 Type 2 diabetes mellitus without complications: Secondary | ICD-10-CM | POA: Diagnosis not present

## 2024-01-11 DIAGNOSIS — M25561 Pain in right knee: Secondary | ICD-10-CM | POA: Diagnosis not present

## 2024-01-11 DIAGNOSIS — I739 Peripheral vascular disease, unspecified: Secondary | ICD-10-CM | POA: Diagnosis not present

## 2024-01-11 DIAGNOSIS — I10 Essential (primary) hypertension: Secondary | ICD-10-CM | POA: Diagnosis not present

## 2024-01-11 HISTORY — PX: IR RADIOLOGIST EVAL & MGMT: IMG5224

## 2024-01-15 ENCOUNTER — Other Ambulatory Visit

## 2024-01-20 ENCOUNTER — Other Ambulatory Visit: Payer: Self-pay | Admitting: Physician Assistant

## 2024-01-20 ENCOUNTER — Ambulatory Visit: Payer: BC Managed Care – PPO | Admitting: Physician Assistant

## 2024-02-22 ENCOUNTER — Encounter: Payer: Self-pay | Admitting: Neurology

## 2024-02-22 ENCOUNTER — Telehealth: Payer: Self-pay | Admitting: Physician Assistant

## 2024-02-22 NOTE — Telephone Encounter (Signed)
Letter done, thanks.

## 2024-02-22 NOTE — Telephone Encounter (Signed)
 Katherine Lawrence husband lefted a message stating that his wife  (Katherine Lawrence) needs a letter stating that she can not serve on a [employer]. Thanks

## 2024-02-23 ENCOUNTER — Encounter: Payer: Self-pay | Admitting: Physician Assistant

## 2024-02-23 NOTE — Telephone Encounter (Signed)
 Letter is ready, will call.

## 2024-03-08 ENCOUNTER — Encounter: Payer: Self-pay | Admitting: Internal Medicine

## 2024-03-30 ENCOUNTER — Encounter: Payer: Self-pay | Admitting: Podiatry

## 2024-03-30 ENCOUNTER — Ambulatory Visit (INDEPENDENT_AMBULATORY_CARE_PROVIDER_SITE_OTHER): Admitting: Podiatry

## 2024-03-30 DIAGNOSIS — B351 Tinea unguium: Secondary | ICD-10-CM

## 2024-03-30 DIAGNOSIS — M79674 Pain in right toe(s): Secondary | ICD-10-CM

## 2024-03-30 DIAGNOSIS — M79675 Pain in left toe(s): Secondary | ICD-10-CM | POA: Diagnosis not present

## 2024-04-04 NOTE — Progress Notes (Signed)
  Subjective:  Patient ID: Katherine Lawrence, female    DOB: [birth year],  MRN: [id number]  Katherine Lawrence presents to clinic today for painful elongated mycotic toenails 1-5 bilaterally which are tender when wearing enclosed shoe gear. Pain is relieved with periodic professional debridement. Her husband is present during today's visit.  Chief Complaint  Patient presents with   Diabetes    [facility] NIDDM A1C 5.6 LOV with PCP 12/09/23.   New problem(s): None.   PCP is Geofm Glade PARAS, MD.  No Known Allergies  Review of Systems: Negative except as noted in the HPI.  Objective: No changes noted in today's physical examination. There were no vitals filed for this visit. [name] is a pleasant 68 y.o. female in NAD. AAO x 3.  Vascular Examination: Capillary refill time immediate b/l. Palpable pedal pulses. Pedal hair present b/l. Pedal edema absent. No pain with calf compression b/l. Skin temperature gradient WNL b/l. No cyanosis or clubbing b/l. No ischemia or gangrene noted b/l.   Neurological Examination: Sensation grossly intact b/l with 10 gram monofilament. Vibratory sensation intact b/l.   Dermatological Examination: Pedal skin with normal turgor, texture and tone b/l.  No open wounds. No interdigital macerations.   Toenails 1-5 b/l thick, discolored, elongated with subungual debris and pain on dorsal palpation.   No hyperkeratotic nor porokeratotic lesions.  Musculoskeletal Examination: Muscle strength 5/5 to all lower extremity muscle groups bilaterally. No pain, crepitus or joint limitation noted with ROM bilateral LE. No gross bony deformities bilaterally. Utilizes cane for ambulation assistance.  Radiographs: None  Last A1c:      Latest Ref Rng & Units 12/09/2023    2:49 PM 07/01/2023   10:20 AM 05/05/2023    2:59 PM  Hemoglobin A1C  Hemoglobin-A1c 4.6 - 6.5 % 5.9  6.5  6.3    Assessment/Plan: 1. Pain due to onychomycosis of toenails of both feet   Patient was evaluated  and treated. All patient's and/or POA's questions/concerns addressed on today's visit. Toenails 1-5 b/l debrided in length and girth without incident. Recommend Bag Balk Moisturizer for daily moisture. Continue soft, supportive shoe gear daily. Report any pedal injuries to medical professional. Call office if there are any questions/concerns. -Patient/POA to call should there be question/concern in the interim.   Return in about 3 months (around 06/30/2024).  Delon LITTIE Merlin, DPM      [place] LOCATION: [address], [place] [address]                   Office [phone]   [facility] LOCATION: [address] [place], [place] [address] Office [phone]

## 2024-05-20 ENCOUNTER — Telehealth: Payer: Self-pay | Admitting: Internal Medicine

## 2024-05-20 NOTE — Telephone Encounter (Signed)
 Copied from CRM [id number]. Topic: Clinical - Medication Refill >> May 20, 2024  4:48 PM Viola F wrote: Medication: amLODipine  (NORVASC ) 5 MG tablet [[id number]]  Has the patient contacted their pharmacy? Yes (Agent: If no, request that the patient contact the pharmacy for the refill. If patient does not wish to contact the pharmacy document the reason why and proceed with request.) (Agent: If yes, when and what did the pharmacy advise?)  This is the patient's preferred pharmacy:  [id number] [address], [place] - [address] AT [facility] [address] [place] [address] Phone: [phone] Fax: [phone]  Is this the correct pharmacy for this prescription? Yes If no, delete pharmacy and type the correct one.   Has the prescription been filled recently? Yes  Is the patient out of the medication? No, 2 LEFT   Has the patient been seen for an appointment in the last year OR does the patient have an upcoming appointment? Yes  Can we respond through MyChart? Yes  Agent: Please be advised that Rx refills may take up to 3 business days. We ask that you follow-up with your pharmacy.

## 2024-05-23 MED ORDER — AMLODIPINE BESYLATE 5 MG PO TABS: ORAL_TABLET | ORAL | 2 refills | Status: AC

## 2024-06-08 ENCOUNTER — Encounter: Payer: Self-pay | Admitting: Internal Medicine

## 2024-06-10 ENCOUNTER — Other Ambulatory Visit: Payer: Self-pay

## 2024-06-10 MED ORDER — METFORMIN HCL ER 750 MG PO TB24: 750.0000 mg | ORAL_TABLET | Freq: Every day | ORAL | 1 refills | Status: AC

## 2024-06-11 ENCOUNTER — Encounter: Payer: Self-pay | Admitting: Internal Medicine

## 2024-06-11 NOTE — Patient Instructions (Addendum)
" ° ° ° ° °  Blood work was ordered.       Medications changes include :   start amlodipine  2.5 mg       Return in about 6 months (around 12/18/2024) for Physical Exam.  "

## 2024-06-11 NOTE — Progress Notes (Signed)
 "     Subjective:    Patient ID: Katherine Lawrence, female    DOB: 11-10-1955, 68 y.o.   MRN: [id number]     HPI Darene is here for follow up of her chronic medical problems.  She is here with her husband.   She is concerned  - has people that come in to the house and just leave.  Her husband said today the pest control guy came - he was only outside.    Medications and allergies reviewed with patient and updated if appropriate.  Medications Ordered Prior to Encounter[1]   Review of Systems  Constitutional:  Negative for appetite change and fever.  Respiratory:  Negative for cough, shortness of breath and wheezing.   Cardiovascular:  Negative for chest pain, palpitations and leg swelling.  Musculoskeletal:  Positive for arthralgias.  Neurological:  Negative for light-headedness and headaches.  Psychiatric/Behavioral:  Negative for dysphoric mood and sleep disturbance. The patient is not nervous/anxious.        Objective:   Vitals:   06/20/24 1346  BP: (!) 142/88  Pulse: 77  Temp: 98 F (36.7 C)  SpO2: 97%   BP Readings from Last 3 Encounters:  06/20/24 (!) 142/88  06/17/24 (!) 176/84  12/25/23 (!) 178/106   Wt Readings from Last 3 Encounters:  06/20/24 220 lb (99.8 kg)  06/17/24 223 lb (101.2 kg)  12/16/23 194 lb (88 kg)   Body mass index is 37.76 kg/m.    Physical Exam Constitutional:      General: She is not in acute distress.    Appearance: Normal appearance.  HENT:     Head: Normocephalic and atraumatic.  Eyes:     Conjunctiva/sclera: Conjunctivae normal.  Cardiovascular:     Rate and Rhythm: Normal rate and regular rhythm.     Heart sounds: Normal heart sounds.  Pulmonary:     Effort: Pulmonary effort is normal. No respiratory distress.     Breath sounds: Normal breath sounds. No wheezing.  Musculoskeletal:     Cervical back: Neck supple.     Right lower leg: No edema.     Left lower leg: No edema.  Lymphadenopathy:     Cervical: No cervical  adenopathy.  Skin:    General: Skin is warm and dry.     Findings: No rash.  Neurological:     Mental Status: She is alert. Mental status is at baseline.  Psychiatric:        Mood and Affect: Mood normal.        Behavior: Behavior normal.        Lab Results  Component Value Date   WBC 9.1 12/09/2023   HGB 12.2 12/09/2023   HCT 36.8 12/09/2023   PLT 264.0 12/09/2023   GLUCOSE 93 12/09/2023   CHOL 170 12/09/2023   TRIG 149.0 12/09/2023   HDL 52.20 12/09/2023   LDLDIRECT 157.6 11/16/2012   LDLCALC 88 12/09/2023   ALT 10 12/09/2023   AST 15 12/09/2023   NA 142 12/09/2023   K 3.7 12/09/2023   CL 103 12/09/2023   CREATININE 0.90 12/09/2023   BUN 18 12/09/2023   CO2 31 12/09/2023   TSH 2.94 12/09/2023   INR 1.03 07/08/2018   HGBA1C 5.9 12/09/2023   MICROALBUR 3.2 (H) 08/11/2007     Assessment & Plan:    See Problem List for Assessment and Plan of chronic medical problems.       [1]  Current Outpatient Medications on File Prior to  Visit  Medication Sig Dispense Refill   acetaminophen  (TYLENOL ) 650 MG CR tablet Take 650 mg by mouth every 8 (eight) hours as needed for pain.     amLODipine  (NORVASC ) 5 MG tablet TAKE 1 TABLET(5 MG) BY MOUTH DAILY FOR BLOOD PRESSURE 90 tablet 2   cyanocobalamin  (VITAMIN B12) 1000 MCG tablet Take 1,000 mcg by mouth daily.     donepezil  (ARICEPT ) 10 MG tablet TAKE 1 TABLET(10 MG) BY MOUTH DAILY 90 tablet 1   memantine  (NAMENDA ) 10 MG tablet TAKE 1 TABLET TWICE A DAY 180 tablet 3   metFORMIN  (GLUCOPHAGE -XR) 750 MG 24 hr tablet Take 1 tablet (750 mg total) by mouth daily with breakfast. 90 tablet 1   rosuvastatin  (CRESTOR ) 40 MG tablet TAKE 1 TABLET(40 MG) BY MOUTH DAILY FOR CHOLESTEROL 90 tablet 3   valsartan  (DIOVAN ) 320 MG tablet TAKE 1 TABLET(320 MG) BY MOUTH DAILY 90 tablet 3   No current facility-administered medications on file prior to visit.   "

## 2024-06-15 NOTE — Progress Notes (Signed)
 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 Patient is currently on donepezil  10 mg daily and memantine  10 mg twice daily.  This patient is accompanied in the office by her husband who supplements the history.  Previous records as well as any outside records available were reviewed prior to todays visit. Patient was last seen on 12/09/2023. Memory decline is noted, today's MMSE at 13/30.Discussed  increasing donepezil ; to 23 mg in an effort to slow down cognitive decline once the diarrhea resolves, patient agrees.   She needs more assistance with ADLs.  Mood is controlled by her PCP     Continue donepezil  10 mg daily and memantine  10 mg twice daily.  Side effects discussed . Will consider increasing donepezil  to 23 mg daily if diarrhea resolves.  Recommend good control of cardiovascular risk factors.   Continue to control mood as per PCP Follow up in 6 months   Discussed the use of AI scribe software for clinical note transcription with the patient, who gave verbal consent to proceed.  History of Present Illness Katherine Lawrence is a 68 year old female who presents for follow-up of cognitive function.  She experiences ongoing issues with short-term memory, including difficulty with new information, conversations, and word-finding. Husband reports that she does demonstrate cognitive decline. There is some impact on long-term memory, as she often repeats questions or stories to her husband. Despite these challenges, she recognizes her home and neighborhood.  No headaches, vision changes, tremors, or loss of smell. Her mood  is sad as her sister died yesterday.   She does not require assistance with daily activities such as showering or dressing. Her husband manages her medications and finances. She maintains a good appetite and hydration, with no issues swallowing. She frequents local restaurants with her husband and cooks minimally at home.  She experiences urge incontinence and reports frequent diarrhea lately. She uses a cane for mobility and has a history of arthritis in both knees, predominantly on the left.      History of Present Illness 04/21/2019:  The patient had a telephone visit on 04/21/2019. She was last seen in the neurology clinic 10 months ago for myalgias. EMG/NCV did not show any evidence of myopathy, blood work no evidence of inflammation. She was seen in 2016 for memory changes. MRI brain in 04/2015 unremarkable. She reports that since her last visit, she is noticing more cognitive changes. She has noticed more hesitation during conversations, she would have to stop and think of what she was going to say. She was laid off in October and offered early retirement, she noticed toward the end that she had to write down everything she was going to say if she had to speak on conference calls. She would go blank when called on to discuss something. She has gotten lost driving, unable to recall how to get to places she has been to for years. She has left the stove on. She infrequently forgets medications. She denies missing bill payments. Sleep and mood are good. She had a knee replacement with continued pain. She denies any headaches, dizziness, vision changes, focal numbness/tingling/weakness. She is not  having as much myalgias as before. She had a repeat MRI brain without contrast done 03/2019 did not show any acute changes. 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      Personally reviewed of the brain 12/03/2021 remarkable for re  demonstrated chronic lacunar infarct within the left corona radiata / basal ganglia, stable mild cerebral white matter chronic small vessel ischemic disease, frontal lobe predominant cerebral atrophy.      Neuropsychological evaluation 11/20/2022 briefly, results suggested severe impairment surrounding all aspects of learning and memory. Additional severe impairments were exhibited across executive functioning, safety/judgment, receptive language, verbal fluency, and confrontation naming. Processing speed represented a relative weakness, while performance variability was exhibited across visuospatial abilities. Relative to her previous evaluation in September 2022, notable decline was exhibited across a majority of assessed cognitive domains. Decline was more modest across visuospatial abilities and general stability was exhibited across attention/concentration. However, progressive decline was pronounced across processing speed, executive functioning, receptive language, expressive language, and all aspects of learning and memory. Regarding etiology, current testing with evidence for negative progression over time strengthens previous concerns surrounding the presence of a neurodegenerative illness. 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 [name] did not benefit from repeated exposure to information, was fully amnestic (i.e., 0% retention) after brief delays across all memory tasks, and performed very poorly across yes/no recognition trials. Taken together, this suggests rapid forgetting and a severe storage impairment, both of which represent hallmark characteristics of this illness. Further impairment surrounding semantic fluency, confrontation naming, and executive functioning follows typical disease progression.              06/17/2024    1:00 PM 07/23/2023   12:00 PM 01/20/2023    5:00 PM  MMSE - Mini Mental State Exam  Not completed:   --   Orientation to time 0 2   Orientation to Place 1 3   Registration 3 3   Attention/ Calculation 2 5   Recall 0 0   Language- name 2 objects 1 1   Language- repeat 1 1   Language- follow 3 step command 3 2   Language- read & follow direction 1 1   Write a sentence 0 0   Copy design 1 0   Total score 13 18       06/21/2018    9:00 AM 04/13/2015    1:20 PM  Montreal Cognitive Assessment   Visuospatial/ Executive (0/5) 4 3  Naming (0/3) 3 3  Attention: Read list of digits (0/2) 2 2  Attention: Read list of letters (0/1) 1 1  Attention: Serial 7 subtraction starting at 100 (0/3) 3 3  Language: Repeat phrase (0/2) 2 2  Language : Fluency (0/1) 0 0  Abstraction (0/2) 2 2  Delayed Recall (0/5) 0 2  Orientation (0/6) 6 6  Total 23 24  Adjusted Score (based on education)  24      Objective:    Neurological Exam:    VITALS:   Vitals:   06/17/24 1238 06/17/24 1251  BP: (!) 173/94 (!) 176/84  Pulse: 80   SpO2: 97%   Weight: 223 lb (101.2 kg)   Height: 5' 4 (1.626 m)     GEN:  The patient appears stated age and is in NAD. HEENT:  Normocephalic, atraumatic.   Neurological examination:  General: NAD, well-groomed, appears stated age. Orientation: The patient is  alert. Oriented to person, not to place and date Cranial nerves: There is good facial symmetry.The speech is not very fluent but clear. No aphasia or dysarthria. Fund of knowledge is reduced. Recent and remote memory are impaired. Attention and concentration are reduced. Unable to name objects and able to repeat phrases.  Hearing is intact to conversational tone.   Sensation: Sensation is intact to light touch throughout Motor: Strength is at least antigravity x4. DTR's 2/4 in UE/LE     Movement examination:  Tone: There is normal tone in the UE/LE Abnormal movements:  no tremor.  No myoclonus.  No asterixis.   Coordination:  There is no decremation with RAM's. Normal finger to nose  Gait and Station: The  patient has some difficulty arising out of a deep-seated chair without the use of the hands. The patient's stride length is short.  Gait is cautious and narrow. Uses a cane to ambulate   Thank you for allowing us  the opportunity to participate in the care of this nice patient. Please do not hesitate to contact us  for any questions or concerns.   Total time spent on today's visit was 25 minutes dedicated to this patient today, preparing to see patient, examining the patient, ordering tests and/or medications and counseling the patient, documenting clinical information in the EHR or other health record, independently interpreting results and communicating results to the patient/family, discussing treatment and goals, answering patient's questions and coordinating care.  Cc:  Geofm Glade PARAS, MD  Camie Sevin 06/17/2024 1:39 PM      "

## 2024-06-17 ENCOUNTER — Ambulatory Visit: Admitting: Internal Medicine

## 2024-06-17 ENCOUNTER — Encounter: Payer: Self-pay | Admitting: Internal Medicine

## 2024-06-17 ENCOUNTER — Ambulatory Visit (INDEPENDENT_AMBULATORY_CARE_PROVIDER_SITE_OTHER): Admitting: Physician Assistant

## 2024-06-17 ENCOUNTER — Other Ambulatory Visit: Payer: Self-pay | Admitting: Physician Assistant

## 2024-06-17 VITALS — BP 176/84 | HR 80 | Ht 64.0 in | Wt 223.0 lb

## 2024-06-17 DIAGNOSIS — F02B4 Dementia in other diseases classified elsewhere, moderate, with anxiety: Secondary | ICD-10-CM

## 2024-06-17 DIAGNOSIS — G300 Alzheimer's disease with early onset: Secondary | ICD-10-CM

## 2024-06-18 ENCOUNTER — Other Ambulatory Visit: Payer: Self-pay | Admitting: Internal Medicine

## 2024-06-19 ENCOUNTER — Other Ambulatory Visit: Payer: Self-pay | Admitting: Internal Medicine

## 2024-06-20 ENCOUNTER — Ambulatory Visit (INDEPENDENT_AMBULATORY_CARE_PROVIDER_SITE_OTHER): Admitting: Internal Medicine

## 2024-06-20 VITALS — BP 134/80 | HR 77 | Temp 98.0°F | Ht 64.0 in | Wt 220.0 lb

## 2024-06-20 DIAGNOSIS — E1169 Type 2 diabetes mellitus with other specified complication: Secondary | ICD-10-CM

## 2024-06-20 DIAGNOSIS — G300 Alzheimer's disease with early onset: Secondary | ICD-10-CM

## 2024-06-20 DIAGNOSIS — I152 Hypertension secondary to endocrine disorders: Secondary | ICD-10-CM | POA: Diagnosis not present

## 2024-06-20 DIAGNOSIS — Z7984 Long term (current) use of oral hypoglycemic drugs: Secondary | ICD-10-CM

## 2024-06-20 DIAGNOSIS — F02B4 Dementia in other diseases classified elsewhere, moderate, with anxiety: Secondary | ICD-10-CM | POA: Diagnosis not present

## 2024-06-20 DIAGNOSIS — Z6837 Body mass index (BMI) 37.0-37.9, adult: Secondary | ICD-10-CM | POA: Diagnosis not present

## 2024-06-20 DIAGNOSIS — E1159 Type 2 diabetes mellitus with other circulatory complications: Secondary | ICD-10-CM | POA: Diagnosis not present

## 2024-06-20 DIAGNOSIS — E785 Hyperlipidemia, unspecified: Secondary | ICD-10-CM

## 2024-06-20 DIAGNOSIS — R350 Frequency of micturition: Secondary | ICD-10-CM

## 2024-06-20 DIAGNOSIS — Z23 Encounter for immunization: Secondary | ICD-10-CM | POA: Diagnosis not present

## 2024-06-20 DIAGNOSIS — Z8673 Personal history of transient ischemic attack (TIA), and cerebral infarction without residual deficits: Secondary | ICD-10-CM

## 2024-06-20 DIAGNOSIS — E538 Deficiency of other specified B group vitamins: Secondary | ICD-10-CM

## 2024-06-20 LAB — LIPID PANEL
Cholesterol: 156 mg/dL (ref 28–200)
HDL: 56.6 mg/dL
LDL Cholesterol: 80 mg/dL (ref 10–99)
NonHDL: 99.56
Total CHOL/HDL Ratio: 3
Triglycerides: 96 mg/dL (ref 10.0–149.0)
VLDL: 19.2 mg/dL (ref 0.0–40.0)

## 2024-06-20 LAB — VITAMIN B12: Vitamin B-12: 1280 pg/mL — ABNORMAL HIGH (ref 211–911)

## 2024-06-20 LAB — COMPREHENSIVE METABOLIC PANEL WITH GFR
ALT: 13 U/L (ref 3–35)
AST: 16 U/L (ref 5–37)
Albumin: 4.6 g/dL (ref 3.5–5.2)
Alkaline Phosphatase: 46 U/L (ref 39–117)
BUN: 16 mg/dL (ref 6–23)
CO2: 31 meq/L (ref 19–32)
Calcium: 9.5 mg/dL (ref 8.4–10.5)
Chloride: 104 meq/L (ref 96–112)
Creatinine, Ser: 0.84 mg/dL (ref 0.40–1.20)
GFR: 72.44 mL/min
Glucose, Bld: 87 mg/dL (ref 70–99)
Potassium: 3.9 meq/L (ref 3.5–5.1)
Sodium: 143 meq/L (ref 135–145)
Total Bilirubin: 0.4 mg/dL (ref 0.2–1.2)
Total Protein: 7.3 g/dL (ref 6.0–8.3)

## 2024-06-20 LAB — HEMOGLOBIN A1C: Hgb A1c MFr Bld: 6.3 % (ref 4.6–6.5)

## 2024-06-20 MED ORDER — AMLODIPINE BESYLATE 2.5 MG PO TABS: 2.5000 mg | ORAL_TABLET | Freq: Every day | ORAL | 2 refills | Status: AC

## 2024-06-20 NOTE — Assessment & Plan Note (Signed)
 Chronic Blood pressure not ideally controlled CBC, CMP Continue amlodipine , but increase to 7.5 mg daily, valsartan  320 mg daily

## 2024-06-20 NOTE — Assessment & Plan Note (Signed)
Chronic Regular exercise and healthy diet encouraged Check lipid panel, CMP Continue Crestor 40 mg daily

## 2024-06-20 NOTE — Assessment & Plan Note (Signed)
Chronic History of CVA-no residual-this was seen on MRI Check lipid panel, CMP Stressed good blood pressure and sugar control Continue Crestor 40 mg daily

## 2024-06-20 NOTE — Assessment & Plan Note (Signed)
 Chronic BMI 37.76 with DM, htn, hld Encouraged regular exercise, but she is limited by arthritis and her dementia Encouraged healthy eating

## 2024-06-20 NOTE — Assessment & Plan Note (Signed)
 Chronic Associated with hyperlipidemia, htn  Lab Results  Component Value Date   HGBA1C 6.3 06/20/2024   Sugars controlled She is not monitoring her sugars at home Check A1c, urine albumin /cr ratio Continue metformin  XR 750 mg daily  Stressed diabetic diet

## 2024-06-20 NOTE — Assessment & Plan Note (Signed)
 Chronic ?Check B12 level ?

## 2024-06-20 NOTE — Assessment & Plan Note (Signed)
Chronic Following with neurology Continue Aricept 10 mg daily, Namenda 5 mg twice daily

## 2024-06-23 ENCOUNTER — Ambulatory Visit: Payer: Self-pay | Admitting: Internal Medicine

## 2024-06-28 ENCOUNTER — Other Ambulatory Visit: Payer: Self-pay | Admitting: Internal Medicine

## 2024-06-28 DIAGNOSIS — R350 Frequency of micturition: Secondary | ICD-10-CM

## 2024-06-28 LAB — URINALYSIS, ROUTINE W REFLEX MICROSCOPIC
Bilirubin Urine: NEGATIVE
Hgb urine dipstick: NEGATIVE
Ketones, ur: NEGATIVE
Leukocytes,Ua: NEGATIVE
Nitrite: NEGATIVE
Specific Gravity, Urine: 1.02 (ref 1.000–1.030)
Total Protein, Urine: 100 — AB
Urine Glucose: NEGATIVE
Urobilinogen, UA: 0.2 (ref 0.0–1.0)
pH: 7.5 (ref 5.0–8.0)

## 2024-06-28 LAB — MICROALBUMIN / CREATININE URINE RATIO
Creatinine,U: 62.1 mg/dL
Microalb Creat Ratio: 556.7 mg/g — ABNORMAL HIGH (ref 0.0–30.0)
Microalb, Ur: 34.6 mg/dL — ABNORMAL HIGH (ref 0.7–1.9)

## 2024-06-30 LAB — URINE CULTURE

## 2024-07-06 ENCOUNTER — Encounter: Payer: Self-pay | Admitting: Podiatry

## 2024-07-06 ENCOUNTER — Ambulatory Visit: Admitting: Podiatry

## 2024-07-06 DIAGNOSIS — B351 Tinea unguium: Secondary | ICD-10-CM | POA: Diagnosis not present

## 2024-07-06 DIAGNOSIS — M79675 Pain in left toe(s): Secondary | ICD-10-CM

## 2024-07-06 DIAGNOSIS — M79674 Pain in right toe(s): Secondary | ICD-10-CM | POA: Diagnosis not present

## 2024-07-11 NOTE — Progress Notes (Signed)
"  °  Subjective:  Patient ID: Katherine Lawrence, female    DOB: 10/21/1955,  MRN: [id number]  Katherine Lawrence presents to clinic today for painful thick toenails that are difficult to trim. Pain interferes with ambulation. Aggravating factors include wearing enclosed shoe gear. Pain is relieved with periodic professional debridement. She is accompanied by her husband on today's visit. Husband states he has not applied the Bag Balm as instructed, but will  start applying it. Chief Complaint  Patient presents with   [facility]    NIDDM Patient with An A1C of  6.3 presents today for dfc & nail trim ERE:Almwd, Glade PARAS,  LMV: 05/2024   New problem(s): None.   PCP is Geofm Glade PARAS, MD.  Allergies[1]  Review of Systems: Negative except as noted in the HPI.  Objective:  There were no vitals filed for this visit. Katherine Lawrence is a pleasant 69 y.o. female in NAD. AAO x 3.  Vascular Examination: Capillary refill time immediate b/l. Palpable pedal pulses. Pedal hair present b/l. Pedal edema absent. No pain with calf compression b/l. Skin temperature gradient WNL b/l. No cyanosis or clubbing b/l. No ischemia or gangrene noted b/l.   Neurological Examination: Sensation grossly intact b/l with 10 gram monofilament. Vibratory sensation intact b/l.   Dermatological Examination: Pedal skin moderately dry b/l.  No open wounds. No interdigital macerations.   Toenails 1-5 b/l thick, discolored, elongated with subungual debris and pain on dorsal palpation.   No hyperkeratotic nor porokeratotic lesions.  Musculoskeletal Examination: Muscle strength 5/5 to all lower extremity muscle groups bilaterally. No pain, crepitus or joint limitation noted with ROM bilateral LE. No gross bony deformities bilaterally. Utilizes cane for ambulation assistance.  Radiographs: None  Assessment/Plan: 1. Pain due to onychomycosis of toenails of both feet   Consent given for treatment. Patient examined. All patient's and/or POA's  questions/concerns addressed on today's visit. Mycotic toenails 1-5 b/l debrided in length and girth without incident. Continue foot and shoe inspections daily. Monitor blood glucose per PCP/Endocrinologist's recommendations. Advised patient's husband to apply Bag Balm to both feet 3 times per week. He related understanding. Continue soft, supportive shoe gear daily. Report any pedal injuries to medical professional. Call office if there are any quesitons/concerns.  Return in about 3 months (around 10/04/2024).  Delon LITTIE Merlin, DPM      [facility] LOCATION: [address], [place] [address]                   Office [phone]   [facility] LOCATION: [address] [place], [place] [address] Office [phone]     [1] No Known Allergies  "

## 2024-07-12 ENCOUNTER — Ambulatory Visit: Admitting: Podiatry

## 2024-07-13 ENCOUNTER — Encounter: Payer: Self-pay | Admitting: *Deleted

## 2024-07-13 NOTE — Progress Notes (Signed)
 Katherine Lawrence                                          MRN: [id number]   07/13/2024   The [facility] Quality Team Specialist reviewed this patient medical record for the purposes of chart review for care gap closure. The following were reviewed: chart review for care gap closure-controlling blood pressure and kidney health evaluation for diabetes:eGFR  and uACR.    [facility] Quality Team

## 2024-10-11 ENCOUNTER — Ambulatory Visit: Admitting: Podiatry

## 2024-12-15 ENCOUNTER — Ambulatory Visit: Admitting: Internal Medicine

## 2024-12-15 ENCOUNTER — Ambulatory Visit: Payer: Self-pay | Admitting: Physician Assistant

## 2025-01-03 ENCOUNTER — Ambulatory Visit: Admitting: Internal Medicine
